# Patient Record
Sex: Female | Born: 1965
Health system: Southern US, Community
[De-identification: ages and names within clinical notes are randomized; demographics above are authoritative.]

## PROBLEM LIST (undated history)

## (undated) DIAGNOSIS — I10 Essential (primary) hypertension: Secondary | ICD-10-CM

## (undated) DIAGNOSIS — D869 Sarcoidosis, unspecified: Secondary | ICD-10-CM

## (undated) DIAGNOSIS — I2510 Atherosclerotic heart disease of native coronary artery without angina pectoris: Secondary | ICD-10-CM

## (undated) DIAGNOSIS — F419 Anxiety disorder, unspecified: Secondary | ICD-10-CM

## (undated) DIAGNOSIS — I749 Embolism and thrombosis of unspecified artery: Secondary | ICD-10-CM

## (undated) HISTORY — DX: Essential (primary) hypertension: I10

## (undated) HISTORY — PX: CORONARY ANGIOPLASTY: SHX604

## (undated) HISTORY — PX: REDUCTION MAMMAPLASTY: SUR839

## (undated) HISTORY — PX: TUBAL LIGATION: SHX77

## (undated) HISTORY — PX: LEG SURGERY: SHX1003

---

## 2002-04-28 ENCOUNTER — Emergency Department (HOSPITAL_COMMUNITY): Admission: EM | Admit: 2002-04-28 | Discharge: 2002-04-29 | Payer: Self-pay | Admitting: *Deleted

## 2002-09-22 ENCOUNTER — Emergency Department (HOSPITAL_COMMUNITY): Admission: EM | Admit: 2002-09-22 | Discharge: 2002-09-22 | Payer: Self-pay | Admitting: Emergency Medicine

## 2003-01-11 ENCOUNTER — Emergency Department (HOSPITAL_COMMUNITY): Admission: EM | Admit: 2003-01-11 | Discharge: 2003-01-12 | Payer: Self-pay | Admitting: Emergency Medicine

## 2003-01-12 ENCOUNTER — Encounter: Payer: Self-pay | Admitting: Emergency Medicine

## 2004-02-26 ENCOUNTER — Emergency Department (HOSPITAL_COMMUNITY): Admission: EM | Admit: 2004-02-26 | Discharge: 2004-02-27 | Payer: Self-pay | Admitting: Emergency Medicine

## 2004-03-06 ENCOUNTER — Ambulatory Visit (HOSPITAL_COMMUNITY): Admission: RE | Admit: 2004-03-06 | Discharge: 2004-03-06 | Payer: Self-pay | Admitting: Cardiology

## 2004-11-15 ENCOUNTER — Emergency Department (HOSPITAL_COMMUNITY): Admission: EM | Admit: 2004-11-15 | Discharge: 2004-11-15 | Payer: Self-pay | Admitting: Family Medicine

## 2005-06-10 ENCOUNTER — Emergency Department (HOSPITAL_COMMUNITY): Admission: EM | Admit: 2005-06-10 | Discharge: 2005-06-10 | Payer: Self-pay | Admitting: Emergency Medicine

## 2005-11-04 ENCOUNTER — Ambulatory Visit: Payer: Self-pay | Admitting: Nurse Practitioner

## 2005-11-05 ENCOUNTER — Ambulatory Visit (HOSPITAL_COMMUNITY): Admission: RE | Admit: 2005-11-05 | Discharge: 2005-11-05 | Payer: Self-pay | Admitting: Internal Medicine

## 2005-12-02 ENCOUNTER — Ambulatory Visit: Payer: Self-pay | Admitting: *Deleted

## 2006-01-02 ENCOUNTER — Ambulatory Visit: Payer: Self-pay | Admitting: Nurse Practitioner

## 2006-01-11 ENCOUNTER — Encounter: Admission: RE | Admit: 2006-01-11 | Discharge: 2006-01-11 | Payer: Self-pay | Admitting: Family Medicine

## 2006-01-15 ENCOUNTER — Ambulatory Visit: Payer: Self-pay | Admitting: Nurse Practitioner

## 2006-01-27 ENCOUNTER — Ambulatory Visit: Payer: Self-pay | Admitting: Nurse Practitioner

## 2006-02-13 ENCOUNTER — Emergency Department (HOSPITAL_COMMUNITY): Admission: EM | Admit: 2006-02-13 | Discharge: 2006-02-14 | Payer: Self-pay | Admitting: Emergency Medicine

## 2006-02-26 ENCOUNTER — Emergency Department (HOSPITAL_COMMUNITY): Admission: EM | Admit: 2006-02-26 | Discharge: 2006-02-27 | Payer: Self-pay | Admitting: Emergency Medicine

## 2006-03-12 ENCOUNTER — Ambulatory Visit: Payer: Self-pay | Admitting: Nurse Practitioner

## 2006-05-03 ENCOUNTER — Emergency Department (HOSPITAL_COMMUNITY): Admission: EM | Admit: 2006-05-03 | Discharge: 2006-05-04 | Payer: Self-pay | Admitting: Emergency Medicine

## 2006-05-15 ENCOUNTER — Ambulatory Visit: Payer: Self-pay | Admitting: Internal Medicine

## 2006-05-19 ENCOUNTER — Ambulatory Visit: Payer: Self-pay | Admitting: Internal Medicine

## 2006-05-21 ENCOUNTER — Ambulatory Visit: Payer: Self-pay | Admitting: Internal Medicine

## 2006-05-21 ENCOUNTER — Inpatient Hospital Stay (HOSPITAL_COMMUNITY): Admission: EM | Admit: 2006-05-21 | Discharge: 2006-05-23 | Payer: Self-pay | Admitting: Internal Medicine

## 2006-05-21 ENCOUNTER — Encounter (INDEPENDENT_AMBULATORY_CARE_PROVIDER_SITE_OTHER): Payer: Self-pay | Admitting: *Deleted

## 2006-05-29 ENCOUNTER — Ambulatory Visit: Payer: Self-pay | Admitting: Internal Medicine

## 2006-07-03 ENCOUNTER — Ambulatory Visit: Payer: Self-pay | Admitting: Internal Medicine

## 2006-07-22 ENCOUNTER — Ambulatory Visit: Payer: Self-pay | Admitting: Internal Medicine

## 2006-09-02 ENCOUNTER — Ambulatory Visit: Payer: Self-pay | Admitting: Internal Medicine

## 2006-09-24 ENCOUNTER — Emergency Department (HOSPITAL_COMMUNITY): Admission: EM | Admit: 2006-09-24 | Discharge: 2006-09-24 | Payer: Self-pay | Admitting: Emergency Medicine

## 2006-11-20 ENCOUNTER — Ambulatory Visit: Payer: Self-pay | Admitting: Internal Medicine

## 2006-12-04 ENCOUNTER — Ambulatory Visit: Payer: Self-pay | Admitting: Internal Medicine

## 2006-12-21 ENCOUNTER — Ambulatory Visit (HOSPITAL_COMMUNITY): Admission: RE | Admit: 2006-12-21 | Discharge: 2006-12-21 | Payer: Self-pay | Admitting: Neurology

## 2007-10-13 DIAGNOSIS — G63 Polyneuropathy in diseases classified elsewhere: Secondary | ICD-10-CM | POA: Insufficient documentation

## 2007-10-30 ENCOUNTER — Emergency Department (HOSPITAL_COMMUNITY): Admission: EM | Admit: 2007-10-30 | Discharge: 2007-10-30 | Payer: Self-pay | Admitting: Emergency Medicine

## 2008-02-17 ENCOUNTER — Emergency Department (HOSPITAL_COMMUNITY): Admission: EM | Admit: 2008-02-17 | Discharge: 2008-02-17 | Payer: Self-pay | Admitting: Emergency Medicine

## 2008-04-12 ENCOUNTER — Emergency Department (HOSPITAL_COMMUNITY): Admission: EM | Admit: 2008-04-12 | Discharge: 2008-04-12 | Payer: Self-pay | Admitting: Emergency Medicine

## 2008-06-16 ENCOUNTER — Emergency Department (HOSPITAL_COMMUNITY): Admission: EM | Admit: 2008-06-16 | Discharge: 2008-06-16 | Payer: Self-pay | Admitting: Emergency Medicine

## 2008-07-25 ENCOUNTER — Emergency Department (HOSPITAL_COMMUNITY): Admission: AC | Admit: 2008-07-25 | Discharge: 2008-07-25 | Payer: Self-pay | Admitting: Emergency Medicine

## 2009-03-06 ENCOUNTER — Emergency Department (HOSPITAL_COMMUNITY): Admission: EM | Admit: 2009-03-06 | Discharge: 2009-03-06 | Payer: Self-pay | Admitting: Emergency Medicine

## 2009-06-19 ENCOUNTER — Emergency Department (HOSPITAL_COMMUNITY): Admission: EM | Admit: 2009-06-19 | Discharge: 2009-06-19 | Payer: Self-pay | Admitting: Emergency Medicine

## 2009-08-09 ENCOUNTER — Observation Stay (HOSPITAL_COMMUNITY): Admission: EM | Admit: 2009-08-09 | Discharge: 2009-08-11 | Payer: Self-pay | Admitting: Emergency Medicine

## 2009-08-10 ENCOUNTER — Encounter (INDEPENDENT_AMBULATORY_CARE_PROVIDER_SITE_OTHER): Payer: Self-pay | Admitting: Cardiology

## 2009-11-24 ENCOUNTER — Observation Stay (HOSPITAL_COMMUNITY): Admission: EM | Admit: 2009-11-24 | Discharge: 2009-11-24 | Payer: Self-pay | Admitting: Emergency Medicine

## 2009-12-23 ENCOUNTER — Emergency Department (HOSPITAL_COMMUNITY): Admission: EM | Admit: 2009-12-23 | Discharge: 2009-12-23 | Payer: Self-pay | Admitting: Family Medicine

## 2010-01-09 ENCOUNTER — Emergency Department (HOSPITAL_COMMUNITY): Admission: EM | Admit: 2010-01-09 | Discharge: 2010-01-10 | Payer: Self-pay | Admitting: Emergency Medicine

## 2010-01-14 ENCOUNTER — Emergency Department (HOSPITAL_COMMUNITY): Admission: EM | Admit: 2010-01-14 | Discharge: 2010-01-14 | Payer: Self-pay | Admitting: Emergency Medicine

## 2010-03-23 ENCOUNTER — Emergency Department (HOSPITAL_COMMUNITY): Admission: EM | Admit: 2010-03-23 | Discharge: 2010-03-24 | Payer: Self-pay | Admitting: Emergency Medicine

## 2010-11-13 ENCOUNTER — Emergency Department (HOSPITAL_COMMUNITY)
Admission: EM | Admit: 2010-11-13 | Discharge: 2010-11-14 | Payer: Self-pay | Source: Home / Self Care | Admitting: Emergency Medicine

## 2010-11-18 LAB — URINALYSIS, ROUTINE W REFLEX MICROSCOPIC
Bilirubin Urine: NEGATIVE
Hgb urine dipstick: NEGATIVE
Ketones, ur: NEGATIVE mg/dL
Nitrite: NEGATIVE
Protein, ur: NEGATIVE mg/dL
Specific Gravity, Urine: 1.029 (ref 1.005–1.030)
Urine Glucose, Fasting: 100 mg/dL — AB
Urobilinogen, UA: 0.2 mg/dL (ref 0.0–1.0)
pH: 5.5 (ref 5.0–8.0)

## 2010-11-18 LAB — DIFFERENTIAL
Basophils Absolute: 0 10*3/uL (ref 0.0–0.1)
Basophils Relative: 0 % (ref 0–1)
Eosinophils Absolute: 0.1 10*3/uL (ref 0.0–0.7)
Eosinophils Relative: 1 % (ref 0–5)
Lymphocytes Relative: 20 % (ref 12–46)
Lymphs Abs: 2.2 10*3/uL (ref 0.7–4.0)
Monocytes Absolute: 0.8 10*3/uL (ref 0.1–1.0)
Monocytes Relative: 7 % (ref 3–12)
Neutro Abs: 7.7 10*3/uL (ref 1.7–7.7)
Neutrophils Relative %: 72 % (ref 43–77)

## 2010-11-18 LAB — BASIC METABOLIC PANEL
BUN: 7 mg/dL (ref 6–23)
CO2: 25 mEq/L (ref 19–32)
Calcium: 8.9 mg/dL (ref 8.4–10.5)
Chloride: 103 mEq/L (ref 96–112)
Creatinine, Ser: 0.69 mg/dL (ref 0.4–1.2)
GFR calc Af Amer: >60 mL/min (ref >60)
GFR calc non Af Amer: >60 mL/min (ref >60)
Glucose, Bld: 169 mg/dL — ABNORMAL HIGH (ref 70–99)
Potassium: 3.9 mEq/L (ref 3.5–5.1)
Sodium: 136 mEq/L (ref 135–145)

## 2010-11-18 LAB — POCT CARDIAC MARKERS
CKMB, poc: <1 ng/mL — ABNORMAL LOW (ref 1.0–8.0)
Myoglobin, poc: 50.9 ng/mL (ref 12–200)
Troponin i, poc: <0.05 ng/mL (ref 0.00–0.09)

## 2010-11-18 LAB — CBC
HCT: 36.1 % (ref 36.0–46.0)
Hemoglobin: 12.2 g/dL (ref 12.0–15.0)
MCH: 30 pg (ref 26.0–34.0)
MCHC: 33.8 g/dL (ref 30.0–36.0)
MCV: 88.9 fL (ref 78.0–100.0)
Platelets: 276 10*3/uL (ref 150–400)
RBC: 4.06 MIL/uL (ref 3.87–5.11)
RDW: 15.5 % (ref 11.5–15.5)
WBC: 10.8 10*3/uL — ABNORMAL HIGH (ref 4.0–10.5)

## 2010-11-24 ENCOUNTER — Encounter: Payer: Self-pay | Admitting: Internal Medicine

## 2010-11-25 ENCOUNTER — Encounter: Payer: Self-pay | Admitting: Internal Medicine

## 2011-01-16 ENCOUNTER — Emergency Department (HOSPITAL_COMMUNITY)
Admission: EM | Admit: 2011-01-16 | Discharge: 2011-01-16 | Disposition: A | Discharge status: Home / Self Care | Payer: Medicaid Other | Attending: Emergency Medicine | Admitting: Emergency Medicine

## 2011-01-16 DIAGNOSIS — I252 Old myocardial infarction: Secondary | ICD-10-CM | POA: Insufficient documentation

## 2011-01-16 DIAGNOSIS — D869 Sarcoidosis, unspecified: Secondary | ICD-10-CM | POA: Insufficient documentation

## 2011-01-16 DIAGNOSIS — L293 Anogenital pruritus, unspecified: Secondary | ICD-10-CM | POA: Insufficient documentation

## 2011-01-16 DIAGNOSIS — B373 Candidiasis of vulva and vagina: Secondary | ICD-10-CM | POA: Insufficient documentation

## 2011-01-16 DIAGNOSIS — N898 Other specified noninflammatory disorders of vagina: Secondary | ICD-10-CM | POA: Insufficient documentation

## 2011-01-16 DIAGNOSIS — E119 Type 2 diabetes mellitus without complications: Secondary | ICD-10-CM | POA: Insufficient documentation

## 2011-01-16 DIAGNOSIS — K219 Gastro-esophageal reflux disease without esophagitis: Secondary | ICD-10-CM | POA: Insufficient documentation

## 2011-01-16 DIAGNOSIS — I10 Essential (primary) hypertension: Secondary | ICD-10-CM | POA: Insufficient documentation

## 2011-01-16 DIAGNOSIS — B3731 Acute candidiasis of vulva and vagina: Secondary | ICD-10-CM | POA: Insufficient documentation

## 2011-01-16 LAB — URINALYSIS, ROUTINE W REFLEX MICROSCOPIC
Glucose, UA: 500 mg/dL — AB
Nitrite: NEGATIVE
pH: 5 (ref 5.0–8.0)

## 2011-01-16 LAB — WET PREP, GENITAL: WBC, Wet Prep HPF POC: NONE SEEN

## 2011-01-16 LAB — GC/CHLAMYDIA PROBE AMP, GENITAL: Chlamydia, DNA Probe: NEGATIVE

## 2011-01-16 LAB — URINE MICROSCOPIC-ADD ON

## 2011-01-16 LAB — POCT PREGNANCY, URINE: Preg Test, Ur: NEGATIVE

## 2011-01-19 LAB — COMPREHENSIVE METABOLIC PANEL
ALT: 19 U/L (ref 0–35)
AST: 19 U/L (ref 0–37)
Calcium: 8.7 mg/dL (ref 8.4–10.5)
Creatinine, Ser: 0.7 mg/dL (ref 0.4–1.2)
GFR calc Af Amer: >60 mL/min (ref >60)
Glucose, Bld: 162 mg/dL — ABNORMAL HIGH (ref 70–99)
Sodium: 138 mEq/L (ref 135–145)
Total Protein: 6.6 g/dL (ref 6.0–8.3)

## 2011-01-19 LAB — URINALYSIS, ROUTINE W REFLEX MICROSCOPIC
Nitrite: NEGATIVE
Protein, ur: NEGATIVE mg/dL
Specific Gravity, Urine: 1.03 (ref 1.005–1.030)
Urobilinogen, UA: 0.2 mg/dL (ref 0.0–1.0)
pH: 5.5 (ref 5.0–8.0)

## 2011-01-19 LAB — URINE MICROSCOPIC-ADD ON

## 2011-01-19 LAB — DIFFERENTIAL
Eosinophils Absolute: 0.1 10*3/uL (ref 0.0–0.7)
Lymphocytes Relative: 29 % (ref 12–46)
Lymphs Abs: 1.1 10*3/uL (ref 0.7–4.0)
Monocytes Relative: 12 % (ref 3–12)
Neutrophils Relative %: 58 % (ref 43–77)

## 2011-01-19 LAB — LIPASE, BLOOD: Lipase: 38 U/L (ref 11–59)

## 2011-01-19 LAB — CBC
MCHC: 35.1 g/dL (ref 30.0–36.0)
MCV: 98.1 fL (ref 78.0–100.0)
RDW: 13.7 % (ref 11.5–15.5)

## 2011-01-20 LAB — GLUCOSE, CAPILLARY: Glucose-Capillary: 186 mg/dL — ABNORMAL HIGH (ref 70–99)

## 2011-01-20 LAB — DIFFERENTIAL
Basophils Relative: 1 % (ref 0–1)
Eosinophils Absolute: 0.1 10*3/uL (ref 0.0–0.7)
Eosinophils Relative: 1 % (ref 0–5)
Lymphs Abs: 1.9 10*3/uL (ref 0.7–4.0)
Monocytes Absolute: 0.6 10*3/uL (ref 0.1–1.0)
Monocytes Relative: 5 % (ref 3–12)
Neutrophils Relative %: 77 % (ref 43–77)

## 2011-01-20 LAB — DIC (DISSEMINATED INTRAVASCULAR COAGULATION)PANEL
INR: 0.96 (ref 0.00–1.49)
Platelets: 257 10*3/uL (ref 150–400)
Smear Review: NONE SEEN

## 2011-01-20 LAB — D-DIMER, QUANTITATIVE: D-Dimer, Quant: 0.26 ug/mL-FEU (ref 0.00–0.48)

## 2011-01-20 LAB — POCT CARDIAC MARKERS
CKMB, poc: 1.7 ng/mL (ref 1.0–8.0)
CKMB, poc: <1 ng/mL — ABNORMAL LOW (ref 1.0–8.0)
Myoglobin, poc: 57.6 ng/mL (ref 12–200)
Myoglobin, poc: 60.4 ng/mL (ref 12–200)
Troponin i, poc: <0.05 ng/mL (ref 0.00–0.09)

## 2011-01-20 LAB — CBC
HCT: 35.9 % — ABNORMAL LOW (ref 36.0–46.0)
Hemoglobin: 12.5 g/dL (ref 12.0–15.0)
MCHC: 34.8 g/dL (ref 30.0–36.0)
MCV: 96.9 fL (ref 78.0–100.0)
RBC: 3.71 MIL/uL — ABNORMAL LOW (ref 3.87–5.11)
WBC: 11.7 10*3/uL — ABNORMAL HIGH (ref 4.0–10.5)

## 2011-01-20 LAB — BASIC METABOLIC PANEL
CO2: 24 mEq/L (ref 19–32)
Chloride: 106 mEq/L (ref 96–112)
Creatinine, Ser: 0.96 mg/dL (ref 0.4–1.2)
GFR calc Af Amer: >60 mL/min (ref >60)
Potassium: 3.6 mEq/L (ref 3.5–5.1)

## 2011-01-20 LAB — PROTIME-INR: INR: 0.95 (ref 0.00–1.49)

## 2011-01-26 LAB — URINALYSIS, ROUTINE W REFLEX MICROSCOPIC
Bilirubin Urine: NEGATIVE
Glucose, UA: 100 mg/dL — AB
Hgb urine dipstick: NEGATIVE
Protein, ur: NEGATIVE mg/dL

## 2011-01-26 LAB — POCT I-STAT, CHEM 8
BUN: 12 mg/dL (ref 6–23)
Chloride: 105 mEq/L (ref 96–112)
HCT: 39 % (ref 36.0–46.0)
Potassium: 4 mEq/L (ref 3.5–5.1)

## 2011-01-26 LAB — POCT CARDIAC MARKERS
CKMB, poc: <1 ng/mL — ABNORMAL LOW (ref 1.0–8.0)
Myoglobin, poc: 24.9 ng/mL (ref 12–200)
Troponin i, poc: <0.05 ng/mL (ref 0.00–0.09)

## 2011-01-26 LAB — POCT PREGNANCY, URINE: Preg Test, Ur: NEGATIVE

## 2011-01-26 LAB — GLUCOSE, CAPILLARY: Glucose-Capillary: 270 mg/dL — ABNORMAL HIGH (ref 70–99)

## 2011-02-06 LAB — COMPREHENSIVE METABOLIC PANEL
Alkaline Phosphatase: 42 U/L (ref 39–117)
BUN: 10 mg/dL (ref 6–23)
Chloride: 106 mEq/L (ref 96–112)
GFR calc non Af Amer: >60 mL/min (ref >60)
Glucose, Bld: 117 mg/dL — ABNORMAL HIGH (ref 70–99)
Potassium: 3.4 mEq/L — ABNORMAL LOW (ref 3.5–5.1)
Total Bilirubin: 0.3 mg/dL (ref 0.3–1.2)

## 2011-02-06 LAB — RAPID URINE DRUG SCREEN, HOSP PERFORMED
Amphetamines: NOT DETECTED
Barbiturates: POSITIVE — AB
Benzodiazepines: NOT DETECTED
Cocaine: NOT DETECTED

## 2011-02-06 LAB — BASIC METABOLIC PANEL
BUN: 10 mg/dL (ref 6–23)
BUN: 13 mg/dL (ref 6–23)
CO2: 25 mEq/L (ref 19–32)
CO2: 27 mEq/L (ref 19–32)
Calcium: 9.1 mg/dL (ref 8.4–10.5)
Chloride: 107 mEq/L (ref 96–112)
Creatinine, Ser: 0.58 mg/dL (ref 0.4–1.2)
Creatinine, Ser: 0.7 mg/dL (ref 0.4–1.2)
Glucose, Bld: 103 mg/dL — ABNORMAL HIGH (ref 70–99)
Potassium: 3.7 mEq/L (ref 3.5–5.1)

## 2011-02-06 LAB — URINALYSIS, ROUTINE W REFLEX MICROSCOPIC
Bilirubin Urine: NEGATIVE
Ketones, ur: NEGATIVE mg/dL
Leukocytes, UA: NEGATIVE
Nitrite: NEGATIVE
Protein, ur: NEGATIVE mg/dL
Urobilinogen, UA: 1 mg/dL (ref 0.0–1.0)
pH: 6 (ref 5.0–8.0)

## 2011-02-06 LAB — GLUCOSE, CAPILLARY
Glucose-Capillary: 108 mg/dL — ABNORMAL HIGH (ref 70–99)
Glucose-Capillary: 113 mg/dL — ABNORMAL HIGH (ref 70–99)
Glucose-Capillary: 142 mg/dL — ABNORMAL HIGH (ref 70–99)
Glucose-Capillary: 156 mg/dL — ABNORMAL HIGH (ref 70–99)
Glucose-Capillary: 96 mg/dL (ref 70–99)

## 2011-02-06 LAB — D-DIMER, QUANTITATIVE: D-Dimer, Quant: <0.22 ug/mL-FEU (ref 0.00–0.48)

## 2011-02-06 LAB — CBC
HCT: 35.4 % — ABNORMAL LOW (ref 36.0–46.0)
MCHC: 34.2 g/dL (ref 30.0–36.0)
Platelets: 201 10*3/uL (ref 150–400)
Platelets: 234 10*3/uL (ref 150–400)
RBC: 3.38 MIL/uL — ABNORMAL LOW (ref 3.87–5.11)
RBC: 3.59 MIL/uL — ABNORMAL LOW (ref 3.87–5.11)
RDW: 13.9 % (ref 11.5–15.5)
WBC: 7.4 10*3/uL (ref 4.0–10.5)
WBC: 7.5 10*3/uL (ref 4.0–10.5)

## 2011-02-06 LAB — PROTIME-INR
INR: 0.94 (ref 0.00–1.49)
Prothrombin Time: 12.5 seconds (ref 11.6–15.2)

## 2011-02-06 LAB — CARDIAC PANEL(CRET KIN+CKTOT+MB+TROPI)
Relative Index: 1.2 (ref 0.0–2.5)
Relative Index: 1.4 (ref 0.0–2.5)
Total CK: 123 U/L (ref 7–177)
Troponin I: 0.01 ng/mL (ref 0.00–0.06)
Troponin I: 0.01 ng/mL (ref 0.00–0.06)

## 2011-02-06 LAB — LIPID PANEL
Cholesterol: 196 mg/dL (ref 0–200)
HDL: 52 mg/dL (ref >39)
LDL Cholesterol: 132 mg/dL — ABNORMAL HIGH (ref 0–99)
Triglycerides: 62 mg/dL (ref <150)

## 2011-02-06 LAB — HEMOGLOBIN A1C: Hgb A1c MFr Bld: 6.1 % (ref 4.6–6.1)

## 2011-02-06 LAB — CK TOTAL AND CKMB (NOT AT ARMC): Relative Index: 1.8 (ref 0.0–2.5)

## 2011-02-06 LAB — POCT CARDIAC MARKERS: Troponin i, poc: <0.05 ng/mL (ref 0.00–0.09)

## 2011-02-06 LAB — TROPONIN I: Troponin I: 0.01 ng/mL (ref 0.00–0.06)

## 2011-02-08 LAB — PREGNANCY, URINE: Preg Test, Ur: NEGATIVE

## 2011-02-08 LAB — URINALYSIS, ROUTINE W REFLEX MICROSCOPIC
Hgb urine dipstick: NEGATIVE
Protein, ur: NEGATIVE mg/dL
Urobilinogen, UA: 1 mg/dL (ref 0.0–1.0)

## 2011-02-11 LAB — CBC
HCT: 40.1 % (ref 36.0–46.0)
Hemoglobin: 13.9 g/dL (ref 12.0–15.0)
MCHC: 34.5 g/dL (ref 30.0–36.0)
MCV: 96.6 fL (ref 78.0–100.0)
Platelets: 240 10*3/uL (ref 150–400)
RBC: 4.16 MIL/uL (ref 3.87–5.11)
RDW: 13.3 % (ref 11.5–15.5)
WBC: 5.3 10*3/uL (ref 4.0–10.5)

## 2011-02-11 LAB — DIFFERENTIAL
Basophils Absolute: 0 10*3/uL (ref 0.0–0.1)
Basophils Relative: 1 % (ref 0–1)
Eosinophils Absolute: 0.1 10*3/uL (ref 0.0–0.7)
Eosinophils Relative: 2 % (ref 0–5)
Lymphocytes Relative: 30 % (ref 12–46)
Lymphs Abs: 1.6 10*3/uL (ref 0.7–4.0)
Monocytes Absolute: 0.4 10*3/uL (ref 0.1–1.0)
Monocytes Relative: 7 % (ref 3–12)
Neutro Abs: 3.1 10*3/uL (ref 1.7–7.7)
Neutrophils Relative %: 60 % (ref 43–77)

## 2011-02-11 LAB — URINALYSIS, ROUTINE W REFLEX MICROSCOPIC
Bilirubin Urine: NEGATIVE
Glucose, UA: NEGATIVE mg/dL
Hgb urine dipstick: NEGATIVE
Ketones, ur: NEGATIVE mg/dL
Nitrite: NEGATIVE
Protein, ur: NEGATIVE mg/dL
Specific Gravity, Urine: 1.024 (ref 1.005–1.030)
Urobilinogen, UA: 0.2 mg/dL (ref 0.0–1.0)
pH: 5.5 (ref 5.0–8.0)

## 2011-02-11 LAB — BASIC METABOLIC PANEL
BUN: 8 mg/dL (ref 6–23)
CO2: 27 mEq/L (ref 19–32)
Calcium: 8.9 mg/dL (ref 8.4–10.5)
Chloride: 103 mEq/L (ref 96–112)
Creatinine, Ser: 0.67 mg/dL (ref 0.4–1.2)
GFR calc Af Amer: >60 mL/min (ref >60)
GFR calc non Af Amer: >60 mL/min (ref >60)
Glucose, Bld: 113 mg/dL — ABNORMAL HIGH (ref 70–99)
Potassium: 4 mEq/L (ref 3.5–5.1)
Sodium: 137 mEq/L (ref 135–145)

## 2011-02-11 LAB — POCT I-STAT, CHEM 8
BUN: 8 mg/dL (ref 6–23)
Calcium, Ion: 1.17 mmol/L (ref 1.12–1.32)
Creatinine, Ser: 0.8 mg/dL (ref 0.4–1.2)
Glucose, Bld: 99 mg/dL (ref 70–99)
TCO2: 27 mmol/L (ref 0–100)

## 2011-02-11 LAB — URINE MICROSCOPIC-ADD ON

## 2011-02-11 LAB — GLUCOSE, CAPILLARY: Glucose-Capillary: 104 mg/dL — ABNORMAL HIGH (ref 70–99)
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
## 2011-03-21 NOTE — Letter (Signed)
October 26, 2006    Lavonda Jumbo, M.D.  [address] [place], [place] [address]   RE:  [name], [name]  MRN:  [id number]  /  DOB:  11-Jan-1966   Dear. Dr. Lynelle Doctor:   My records indicate that [name], a very nice 45 year old black  female, failed to followup with me on two consecutive occasions for a  presumed diagnosis of sarcoid. As you may recall from previous notes,  she had classic cobblestoning of the airway, but we were limited in  terms providing adequate tissue to comfirm this due to bleeding at  biopsy (that did not show sarcoid). Followup chest x-ray on September 02, 2006, continued to show evidence of mediastinal hilar adenopathy and I  recommended followup cxr to make sure that these changes were stable and  that she was not developing any other complications of sarcoid.   When I last saw her on October 31, she had no problems with dyspnea or  cough while taking omeprazole for presumed component of reflux.   According to my records, she is due for a chest x-ray on February 1st,  and this can be done at your office. Alternatively, she could contact my  office for an appointment, but our policy is that if she continues to  fail to keep appointments then we can no longer see her here.    Sincerely,      Casimiro Needle B. Sherene Sires, MD, [facility]  Electronically Signed    MBW/MedQ  DD: 10/26/2006  DT: 10/26/2006  Job #: [id number]   CC:    [name]

## 2011-03-21 NOTE — Assessment & Plan Note (Signed)
[facility]                               PULMONARY OFFICE NOTE   NAME:[name], [name]                       MRN:          [id number]  DATE:05/15/2006                            DOB:          August 04, 1966    REASON FOR CONSULTATION:  Cough/abnormal CT scan.   HISTORY:  45 year old black female with indolent onset of cough 2 months ago  that she thought initially was a bad chest cold or bronchitis, but has  gradually worsened to the point where she is having trouble day and night  with a hacking cough, associated sometimes with dyspnea, and generalized  chest discomfort during coughing.  She had also noticed loss of appetite and  thinks she may be losing weight, and for this reason was seen by Dr. Lynelle Doctor,  and a chest x-ray, CT scan suggested adenopathy, and she is therefore seen  at Dr. Delford Field request for consideration for biopsy.   The patient denies any new ocular or articular complaints to suggest  sarcoid, or any skin rash, fevers or saturating sweats.   She has already been treated with macrolide antibiotics and bronchodilators,  but she said it did not help the cough at all.   PAST MEDICAL HISTORY:  Significant for hypertension, obesity with  hypertension and diabetes.   ALLERGIES:  None known.   MEDICATIONS:  None regularly.   SOCIAL HISTORY:  She has never smoked, works at [employer].   FAMILY HISTORY:  Positive for asthma, reflux and diabetes.  Negative for  sarcoid or rheumatologic disease.   REVIEW OF SYSTEMS:  Taken in detail on the work sheet, significant for the  problems as outlined above.   PHYSICAL EXAMINATION:  This is a moderately obese black female in acute  distress.  She is afebrile and normal vital signs.  HEENT:  Unremarkable.  Pharynx is clear, nasal turbinates are normal with no  crusting, pallor, polyps or cyanosis.  NECK:  Supple without cervical adenopathy or tenderness.  Trachea is  midline, no  organomegaly.  LUNGS:  Lung fields are perfectly clear bilaterally to auscultation and  percussion.  There is good inspiratory and expiratory air movement, and no  cough elicited on inspiratory or expiratory maneuvers.  CARDIAC:  There is regular rhythm without murmur, gallop, rub, or increase  in P2.  S1, S2 were diminished.  ABDOMEN:  Obese/benign with normal excursion.  EXTREMITIES:  Warm without calf tenderness, cyanosis, clubbing or edema.  She did have very subtle macronodular skin changes over the right greater  than left elbows.   Chest x-ray and CT scan were consistent with sarcoid, although there was  some asymmetry to the mediastinal, and adenopathy more on the left than the  right in a CT scan I reviewed from May 04, 2006.   IMPRESSION:  This patient almost certainly has sarcoid, with extensive  mediastinal hilar adenopathy, albeit somewhat asymmetric involving the left  greater than the right chest.  She does not appear to have significant  interstitial lung disease, but has developed significant cough, and may have  subtle skin changes as  well.  If she had more classic skin changes, I would  recommend a skin biopsy first, but based on the high diagnostic yield for  bronchoscopy and relatively low risk, I believe the best way to make the  most specific diagnosis is to proceed with bronchoscopy with transbronchial  biopsy, and I discussed this issue with her at length.  She agreed  tentatively to schedule this at [facility] May 21, 2006.                                   Charlaine Dalton. Sherene Sires, MD, [facility]   MBW/MedQ  DD:  05/16/2006  DT:  05/16/2006  Job #:  [id number]   cc:   Devoria Albe, MD

## 2011-03-21 NOTE — Op Note (Signed)
NAME:  [name], [name]                ACCOUNT NO.:  [id number]   MEDICAL RECORD NO.:  [id number]          PATIENT TYPE:  AMB   LOCATION:  CARD                         FACILITY:  [facility]   PHYSICIAN:  Casimiro Needle B. Sherene Sires, M.D. [facility] OF BIRTH:  1966-10-01   DATE OF PROCEDURE:  05/21/2006  DATE OF DISCHARGE:                                 OPERATIVE REPORT   PROCEDURE PERFORMED:  Fiberoptic bronchoscopy with transbronchial biopsy of  the left upper lobe.   REFERRING PHYSICIAN:  Lavonda Jumbo, M.D.   For history and indications, please see attached dictated consultation note.  The patient agreed to the procedure after a full discussion of risks,  benefits and alternatives.  She was premedicated with 1% lidocaine while  being observed under continuous pulse oximetry and surface ECG.  She  maintained adequate saturations except for a brief period of desaturation  responding to jaw lift during the procedure.   DESCRIPTION OF PROCEDURE:  Neither naris could be adequately cannulated  despite use of topical Neo-Synephrine and therefore a bite block was placed  and the procedure was performed via the oropharynx.   The patient received a total of 7.5 mg IV Versed and 100 mg of IV Demerol  during the procedure for adequate sedation and cough suppression.   DESCRIPTION OF PROCEDURE:  Using a standard flexible video bronchoscope, and  an oral bite block/adapter, the oropharynx was visually inspected and larynx  were visually inspected with no obvious upper airway lesions.  Using  additional 1% lidocaine as needed, the entire tracheobronchial tree was then  explored bilaterally with the following findings.  (1) Trachea, carina and  all the airways opened widely to the subsegmental level.  (2) There was mild  diffuse airway edema with erythema and cobblestoning was present throughout  the airways of the left upper and lower lobes.  This was typical of sarcoid.   PROCEDURE:  1.  Using initially the  lingula was lavaged with minimal return, sent for      AFB and fungal stain and culture.  2.  Transbronchial biopsies were obtained x 3 within the left upper lobe      with moderate bleeding encountered after the second biopsy and      controlled with saline lavage.   After the procedure, the patient again had trouble with upper airway  obstruction which responded to a jaw lift maneuver and was given Marcaine to  reverse the effects of Demerol.  Will be observed and also follow-up chest x-  ray obtained.   IMPRESSION:  Prominent mediastinal and left hilar adenopathy associated with  cobblestoning of the airway, classic for sarcoid.   RECOMMENDATIONS:  Await tissue confirmation with follow-up in the office  obtained.           ______________________________  Charlaine Dalton. Sherene Sires, M.D. [facility]     MBW/MEDQ  D:  05/21/2006  T:  05/21/2006  Job:  [id number]

## 2011-03-21 NOTE — Assessment & Plan Note (Signed)
[facility]                               PULMONARY OFFICE NOTE   NAME:[name], Sandra                       MRN:          [id number]  DATE:05/29/2006                            DOB:          Mar 29, 1966    Pulmonary/extended office visit.   HISTORY:  45 year old black female with a 52-month history of cough when I  saw her on July 13 with classic mediastinal and hilar changes suggesting  sarcoid.  Bronchoscopy was performed on July 19, indicating classic  cobblestoning pattern in the left greater than right upper lobe.  She had  significant bleeding after the second biopsy, and minimal tissue was  obtained, which was nondiagnostic.  However, she dramatically improved  during hospitalization, and is worse since discharge having tapered  prednisone off, and comes in complaining of a throat throbbing, increasing  dyspnea with activity and also when she tries to lie down at night, but no  hemoptysis or purulent sputum, fevers, chills, sweats.  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   MEDICATIONS:  At this point consist only of:  1.  Elavil 25 mg 1/2 daily.  2.  Protonix dose after breakfast.   PHYSICAL EXAMINATION:  GENERAL:  She is an obese ambulatory black female in  no acute distress.  VITAL SIGNS:  She is afebrile, stable vital signs, with a weight of 212  pounds, which is up 3 pounds from previous visit.  HEENT:  Unremarkable.  Anicteric sclerae.  NECK:  Supple without cervical adenopathy or tenderness.  Trachea is midline  without thyromegaly.  CHEST:  Lung fields are perfectly clear bilaterally to auscultation and  percussion.  HEART:  Appears regular rhythm without murmur, gallop or rub.  ABDOMEN:  Soft, benign.  EXTREMITIES:  Warm without calf tenderness, cyanosis, clubbing or edema.   IMPRESSION:  I had an extended discussion with this patient, lasting 15-20  minutes of a 25 minute visit  on the following topics:  1.  First, there was enough evidence clinically and by bronchoscopy without      specific tissue diagnosis to indicate she has sarcoid involving airways,      lymph nodes and eye tissue.  Based on the amount of symptoms she is      having as well as ocular involvement, I have strongly recommended      starting her on prednisone 10 mg tablets 2 q.a.m. until better, and then      1 q.a.m. thereafter, and spent extra time educating her regarding      dietary issues.  2.  For her sore throat, I have empirically recommended increasing Protonix      up to a 40 mg tablet b.i.d., continuing Magic Mouthwash and Endal HG 2      tsp q.4 h. p.r.n. cough.  3.  For her diabetes I recommended Glucotrol 5 mg q.a.m. and add a second      dose in the morning if her blood sugar in the fasting is greater than      150.  4.  I have also empirically continued Singulair 10 mg q.p.m., though I do      not really it has anything at all to offer here.  5.  She has also been placed on Elavil 25 mg 1/2 q.h.s., which I have asked      her to continue.   I went over all of these issues in writing with her, made sure she could  read them back to me, and also dietary issues as noted above.                                   Charlaine Dalton. Sandra Sires, MD, [facility]   MBW/MedQ  DD:  05/29/2006  DT:  05/30/2006  Job #:  [id number]

## 2011-03-21 NOTE — Assessment & Plan Note (Signed)
[facility]                               PULMONARY OFFICE NOTE   NAME:[name], [name]                       MRN:          [id number]  DATE:05/19/2006                            DOB:          10/16/1966    HISTORY:  This is a pulmonary/acute office evaluation.  A 45 year old black  female just seen on May 15, 2006, with chronic cough and evidence of hilar  adenopathy by chest CT scan consistent with sarcoid.  Tentatively, I have  scheduled a bronchoscopy for May 21, 2006, but she comes in today acutely  worse over the last several days with a sore tongue and throat and disclosed  for the first time that she is actually maintained on ACE inhibitors, albeit  only at 2.5 mg of Lisinopril a day.  She also takes Protonix daily.  She  denies fevers, chills, sweats, purulent sputum, ocular or articular  complaints.   PHYSICAL EXAMINATION:  GENERAL:  She is a pleasant, ambulatory, obese, black  female in no acute distress.  VITAL SIGNS:  Blood pressure is only 118/70, pulse rate 71 and regular.  HEENT:  Reveals mild thrush and mild erythema of the oropharynx.  No  definite purulent secretions.  NECK:  Supple without cervical adenopathy or tenderness.  Trachea is  midline.  LUNGS:  Fields perfectly clear bilaterally auscultation/percussion.  HEART:  Regular rhythm.  No murmur, gallop, rub.  ABDOMEN:  Soft, obese but benign.  EXTREMITIES:  Warm without calf tenderness, cyanosis, or clubbing.   LABORATORY DATA:  Reviewed from May 15, 2006, indicates normal CBC, LFTs,  and an ACE level of 85.   IMPRESSION:  1.  This patient most likely does have sarcoid as a cause of both her      adenopathy and elevated ACE.  I recommended proceeding with      bronchoscopy.  2.  However, her acute upper airway symptoms now are probably related to      nonspecific pharyngitis and/or Candidiasis but with markedly      disproportionate symptoms due to the combination of  reflux, her obesity,      and ACE inhibitors.  I have asked her to stop the ACE inhibitors.  Start      taking magic mouthwash 2 teaspoons q.i.d.  Continue Protonix before      meals  perfectly regularly and gave her Endal HD for cough 2 teaspoons      every 4 hours.   Unless her condition deteriorates significantly, I see no reason to postpone  proceeding with bronchoscopy on May 21, 2006.                                  Charlaine Dalton. Sherene Sires, MD, [facility]   MBW/MedQ  DD:  05/19/2006  DT:  05/19/2006  Job #:  [id number]   cc:   Lavonda Jumbo, MD

## 2011-03-21 NOTE — Assessment & Plan Note (Signed)
[facility]                               PULMONARY OFFICE NOTE   NAME:[name], [name]                       MRN:          [id number]  DATE:07/03/2006                            DOB:          19-Dec-1965    HISTORY OF PRESENT ILLNESS:  This is a 45 year old African-American female  patient of Dr. Thurston Hole with a history of suspected sarcoid with classic  mediastinal and hilar changes on x-ray and classic cobblestoning pattern,  left greater than right, on bronchoscopy on July 19th.  The patient presents  for a one month followup today.  Since last visit, the patient had been  placed on prednisone 20 mg, along with Singulair daily.  Patient reports  that her shortness of breath and fatigue have decreased but not totally  resolved.  Complains over the last week that she has developed an acne-like  rash along her face, that is somewhat pruritic.  She denies any increased  cough, purulent sputum, hemoptysis, chest pain, or orthopnea.   PAST MEDICAL HISTORY:  Reviewed.   CURRENT MEDICATIONS:  Reviewed.   PHYSICAL EXAMINATION:  VITAL SIGNS:  She is afebrile with stable vital  signs.  O2 saturation is 98% on room air.  GENERAL:  Patient is a pleasant female in no acute distress.  HEENT:  Posterior pharynx is clear.  NECK:  Supple without adenopathy.  No JVD.  LUNGS:  Lung sounds are clear without any wheezing or crackles.  CARDIAC:  Regular rate and rhythm.  ABDOMEN:  Soft and obese.  EXTREMITIES:  Warm without any edema.  SKIN:  Multiple scattered papules along the face.  No redness is noted.   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  Patient does seem to be      slightly improved and will decrease prednisone down to 10, alternating      with 20, until seen back in four weeks or sooner if needed.  2. Facial rash, questionable etiology, possibly an acne-like rash      secondary to steroids.  Hopefully with decreasing  prednisone, this will      improve.  She has been advised to use over-the-counter acne wash.      Patient is advised that if this does not improve or worsen, she will      need referral to dermatology for evaluation.                                   Rubye Oaks, NP                                Charlaine Dalton. Sherene Sires, MD, [facility]   TP/MedQ  DD:  07/07/2006  DT:  07/07/2006  Job #:  [id number]

## 2011-03-21 NOTE — Assessment & Plan Note (Signed)
[facility]                             PULMONARY OFFICE NOTE   NAME:[name], [name]                       MRN:          [id number]  DATE:11/20/2006                            DOB:          02-13-1966    HISTORY:  A 45 year old, black female with clinical diagnosed sarcoid  made in September 2007 based on a bronchoscopy showing severe  cobblestoning but with a limited biopsy due to bleeding.  The patient  had a dramatic improvement in all of her symptoms on prednisone on her  last visit and I had encouraged her to reduce the dose down to 10 mg 1/2  on even days only and return for followup every 6 weeks.  However, she  missed her last 2 appointments and comes in today stating that she is  worse because she can no longer see out of her right eye.  She said  she only sees black.  She mentions she already has an eye doctor who  knows she has sarcoid, in fact, states that he reported to her I can't  do anything for you because you have sarcoid.  I do not have any record  of her ophthalmology evaluation here however.   She denies any headache, fever, significant cough, or dyspnea.   PHYSICAL EXAMINATION:  She is a chronically ill, obese, black female in  no acute distress, weighing 212 pounds.  There is no change in baseline.  HEENT:  She does, indeed, appear to have loss of vision in the right  eye.  Funduscopy does not reveal any obvious corneal or retinal  abnormality however.  NECK:  Supple without cervical adenopathy or tenderness.  Trachea is  midline with no thyromegaly.  Lung fields are completely clear bilaterally to auscultation and  percussion.  There is a regular rhythm without murmur, gallop, or rub.  ABDOMEN:  Soft, benign.  EXTREMITIES:  Warm without calf tenderness, cyanosis, or clubbing.   Heme-saturation 98% on room air.   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  I have  asked her to have her husband drive  her directly over to her eye  doctor's office and, if her eye doctor's office is not open, to go  directly to the emergency room for ophthalmology evaluation today.   In the meantime, I believe it is appropriate to continue the prednisone  at a floor of 10 mg per day.  But, if higher doses are necessary for  ocular purpose, then that is certainly reasonable.   She tells me she is under the care of another physician at the [facility].  I would like to, however, continue to follow her for the  sarcoid at least every 2 weeks with a chest x-ray on her next visit.     Charlaine Dalton. Sherene Sires, MD, [facility]  Electronically Signed   MBW/MedQ  DD: 11/20/2006  DT: 11/20/2006  Job #: [id number]

## 2011-03-21 NOTE — Assessment & Plan Note (Signed)
[facility]                               PULMONARY OFFICE NOTE   NAME:[name], [name]                       MRN:          [id number]  DATE:07/22/2006                            DOB:          1966/09/15    HISTORY:  A 45 year old black female never smoker with cough starting back  in May 2007 associated with plastic sarcoid changes by chest x-ray and  moderate elevation of ACE level combined with classic cobblestoning of the  airway by bronchoscopy on July 19 with limited biopsies because of bleeding  but a classic pattern nevertheless for sarcoid which has responded very  nicely to prednisone. She is tapered now down to 5 mg a day with no  significant increasing cough or dyspnea, chest pain, fevers, chills, sweats,  orthopnea, PND, leg pain, ocular or articular complaints. Her course has  been complicated by an acne-like rash that was felt to be secondary to  steroids, which has in fact improved as the prednisone has been tapered.   Medications reviewed with the patient in detail and recorded on the face  sheet on July 22, 2006. She says she rarely uses albuterol maybe once  or twice a week with no nocturnal need and has been taking Glucotrol 10 mg  q.a.m. with CBGs typically in the 100 to 150 range.   PHYSICAL EXAMINATION:  GENERAL:  She is a pleasant ambulatory black female  in no acute distress.  VITAL SIGNS:  Normal vital signs.  HEENT:  Unremarkable. Pharynx clear.  LUNGS:  Lung fields are completely clear bilaterally to auscultation and  percussion.  HEART:  Regular rhythm without murmur, gallop or rub.  ABDOMEN:  Soft, benign.  EXTREMITIES:  Warm without calf tenderness, cyanosis, clubbing or edema.   IMPRESSION:  Classic sarcoid by both chest x-ray, CT scanning, bronchoscopy  and in every regard except for the fact that she had a negative biopsy,  albeit with limited sampling due to bleeding at the time of her biopsy.   RECOMMENDATIONS:  Continue to taper the prednisone looking for any flareup  of her original symptoms of cough or increase in ACE level over baseline.   FOLLOWUP:  In 6 weeks with repeat chest x-ray and ACE level then.                                   Charlaine Dalton. Sherene Sires, MD, [facility]   MBW/MedQ  DD:  07/22/2006  DT:  07/24/2006  Job #:  [id number]   cc:   Devoria Albe, M.D.

## 2011-03-21 NOTE — Assessment & Plan Note (Signed)
[name]                               PULMONARY OFFICE NOTE   NAME:[name], [name]                       MRN:          [id number]  DATE:09/02/2006                            DOB:          May 24, 1966    HISTORY:  This is a 45 year old black female with a clinical diagnosis of  sarcoid made in September 2007 based on bronchoscopy showing severe  cobblestoning but limited biopsy because of bleeding that was nonspecific.  The patient has had dramatic improvement in all of her symptoms on  prednisone, and now has been able to taper down to 10 mg tablets, 1/2 q.a.m.  She misunderstood the instructions on Prilosec and had been taking it at  bedtime.  She has trouble actually naming any of her medicines and is also  having trouble correlating instructions against the actual tablets.  She  does have albuterol, states she has not needed it since her previous visit.  However, overall she is doing well except for an occasional feeling of hot  in her stomach.  She specifically denies any ocular or articular  complaints, fevers, chills, sweats, cough, or dyspnea.   ON PHYSICAL EXAMINATION:  GENERAL:  She is an obese black female in no acute  distress.  VITAL SIGNS:  Weight 214 pounds, which is no change in baseline.  HEENT:  Unremarkable.  OROPHARYNX:  Clear.  LUNG FIELDS:  Perfectly clear bilaterally to auscultation and percussion.  HEART:  Regular rate and rhythm without murmur, gallop, or rub.  ABDOMEN:  Soft, benign.  EXTREMITIES:  Warm without calf tenderness, cyanosis, clubbing, or edema.   Chest x-ray is basically normal except for minimal mediastinal hilar  adenopathy.   IMPRESSION:  Dramatic response radiographically and clinically to prednisone  for sarcoid associated with symptoms initially of cough and also prominent  hilar adenopathy as well as an ACE level of 85.   RECOMMENDATIONS:  Repeat ACE level today along with a CMET.  I will also  do  a hemoglobin A1c to be complete since she is going to see her primary  physicians this week for followup and asked her to try prednisone 10 mg  tablets, 1/2 every other day.   I had an extended discussion with this patient regarding medication  reconciliation issues.  I gave her written instructions on this occasion  also and then reviewed them line by line to make sure there was 100%  correlation between the instructions and what she actually does at home in  terms of medication self-administration noting the previous confusion  regarding omeprazole and prednisone dosing.   Specifically I recommended the following:  1. Prednisone 10 mg, 1/2 on even days only.  2. Glucotrol 10 mg q.a.m. pending review by Dr. Lynelle Doctor.  3. Omeprazole 20 mg tablets, 30-60 minutes before her first meal every      day.  4. Citrucel empirically at 1 teaspoon b.i.d. for complaints of chronic      constipation.  5. If she is having any wheezing or dyspnea, she has the ability to take  Albuterol 2 puffs every 4 hours p.r.n.    ______________________________  Sandra Dalton. Sherene Sires, MD, [facility]    MBW/MedQ  DD: 09/02/2006  DT: 09/02/2006  Job #: [id number]   cc:   Devoria Albe, M.D.

## 2011-03-21 NOTE — Discharge Summary (Signed)
[name], [name]                ACCOUNT NO.:  [id number]   MEDICAL RECORD NO.:  [id number]          PATIENT TYPE:  INP   LOCATION:  0165                         FACILITY:  [facility]   PHYSICIAN:  Coralyn Helling, M.D.      DATE OF BIRTH:  1966/10/20   DATE OF ADMISSION:  05/21/2006  DATE OF DISCHARGE:  05/23/2006                                 DISCHARGE SUMMARY   DISCHARGE DIAGNOSES:  Hypoxic respiratory failure, status post fiberoptic  bronchoscopy secondary to alveolar bleeding, upper airway obstruction  secondary to ACE inhibitor, and post bronchoscopy fever, as well as  transient hypotension.   HOSPITAL SUMMARY:  [name] is a 45 year old African-American female who  was evaluated by Dr. Sherene Sires for cough of two months duration.  She had a chest  x-ray and CT scan which was suggestive of adenopathy and as a result she was  scheduled for fiberoptic bronchoscopy.  She was noted to have an elevated  ACE level as well, and there was a concern she may have sarcoidosis.  She  was admitted to the intensive care unit for close monitoring post  bronchoscopy and remained relatively stable.  She was started on Solu-Medrol  for possible sarcosis as well as upper airway obstruction possibly related  to ACE inhibitor.  She continued to improve and was able to ambulate in the  hallway without dyspnea.   DISCHARGE MEDICATIONS:  1.  Avalox 400 mg p.o. daily x3 days.  2.  Glucotrol 5 mg p.o. daily.  3.  Protonix 40 mg p.o. b.i.d.  4.  Prednisone taper over 5 days.   DISPOSITION:  She is to be discharged home, and she will have followup with  Dr. Sherene Sires within one to two weeks to discuss her biopsy results as well as to  assess continued need for prednisone therapy.      Coralyn Helling, M.D.  Electronically Signed     VS/MEDQ  D:  05/23/2006  T:  05/23/2006  Job:  [id number]

## 2011-03-21 NOTE — Assessment & Plan Note (Signed)
[facility]                             PULMONARY OFFICE NOTE   NAME:LITTLEMyrian, Botello                       MRN:          [id number]  DATE:12/04/2006                            DOB:          11/15/1965    HISTORY:  A 45 year old black female with a clinical diagnosis of  sarcoid made in September 2007 based on the appearance by bronchoscopy  of severe cobblestoning, but limited biopsies because of bleeding at the  time of the biopsy.  She had dramatic improvement in her respiratory  symptoms on prednisone, and saw me on November 20, 2006, down to 10 mg  1/2 on even days only, after missing several of her appointments and  then developing ocular symptoms.  She is subsequently under the care now  of both Ophthalmology and Dr. Thad Ranger, receiving cortisone  injections, and does not know what to do with her prednisone, and comes  back to day for followup.  Again, she has no significant fevers, chills,  sweats, dyspnea or cough.   PHYSICAL EXAMINATION:  She is an obese, ambulatory, black female, in no  acute distress.  She had stable vital signs.  HEENT:  Unremarkable.  Oropharynx clear.  LUNG FIELDS:  Clear bilaterally to auscultation and percussion.  There was palpable adenopathy.  There was no S3, murmur, gallop, rub or increase in P2.  ABDOMEN:  Soft and benign, but obese.  EXTREMITIES:  Warm without calf tenderness, cyanosis, clubbing or edema.   Chest x-ray shows no significant residual interstitial changes or  significant adenopathy.   IMPRESSION:  1. No evidence of pulmonary sarcoid.  2. 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  Since Dr. Thad Ranger is now      giving her injections of cortisone, I am going to defer completely      to his judgment how to use systemic steroids both in oral and      injected form.  Pulmonary followup can, therefore, be on a p.r.n.      basis.     Charlaine Dalton. Sherene Sires, MD, [facility]  Electronically Signed    MBW/MedQ  DD: 12/04/2006  DT: 12/04/2006  Job #: [id number]   cc:   Lavonda Jumbo, M.D.  Michael L. Thad Ranger, M.D.

## 2011-04-13 ENCOUNTER — Emergency Department (HOSPITAL_COMMUNITY)
Admission: EM | Admit: 2011-04-13 | Discharge: 2011-04-14 | Disposition: A | Discharge status: Home / Self Care | Payer: Medicaid Other | Attending: Emergency Medicine | Admitting: Emergency Medicine

## 2011-04-13 DIAGNOSIS — R51 Headache: Secondary | ICD-10-CM | POA: Insufficient documentation

## 2011-04-13 DIAGNOSIS — K219 Gastro-esophageal reflux disease without esophagitis: Secondary | ICD-10-CM | POA: Insufficient documentation

## 2011-04-13 DIAGNOSIS — M79609 Pain in unspecified limb: Secondary | ICD-10-CM | POA: Insufficient documentation

## 2011-04-13 DIAGNOSIS — R0989 Other specified symptoms and signs involving the circulatory and respiratory systems: Secondary | ICD-10-CM | POA: Insufficient documentation

## 2011-04-13 DIAGNOSIS — D869 Sarcoidosis, unspecified: Secondary | ICD-10-CM | POA: Insufficient documentation

## 2011-04-13 DIAGNOSIS — R0609 Other forms of dyspnea: Secondary | ICD-10-CM | POA: Insufficient documentation

## 2011-04-13 DIAGNOSIS — E119 Type 2 diabetes mellitus without complications: Secondary | ICD-10-CM | POA: Insufficient documentation

## 2011-04-13 DIAGNOSIS — I10 Essential (primary) hypertension: Secondary | ICD-10-CM | POA: Insufficient documentation

## 2011-04-13 DIAGNOSIS — R079 Chest pain, unspecified: Secondary | ICD-10-CM | POA: Insufficient documentation

## 2011-04-13 DIAGNOSIS — E669 Obesity, unspecified: Secondary | ICD-10-CM | POA: Insufficient documentation

## 2011-04-13 DIAGNOSIS — Z79899 Other long term (current) drug therapy: Secondary | ICD-10-CM | POA: Insufficient documentation

## 2011-04-14 ENCOUNTER — Emergency Department (HOSPITAL_COMMUNITY): Payer: Medicaid Other

## 2011-04-14 LAB — CBC
MCH: 28.5 pg (ref 26.0–34.0)
MCHC: 33.2 g/dL (ref 30.0–36.0)
MCV: 85.9 fL (ref 78.0–100.0)
Platelets: 321 10*3/uL (ref 150–400)

## 2011-04-14 LAB — CK TOTAL AND CKMB (NOT AT ARMC): Total CK: 107 U/L (ref 7–177)

## 2011-04-14 LAB — BASIC METABOLIC PANEL
CO2: 26 mEq/L (ref 19–32)
Calcium: 9.1 mg/dL (ref 8.4–10.5)
Chloride: 104 mEq/L (ref 96–112)
Glucose, Bld: 297 mg/dL — ABNORMAL HIGH (ref 70–99)
Sodium: 138 mEq/L (ref 135–145)

## 2011-04-14 LAB — DIFFERENTIAL
Basophils Relative: 0 % (ref 0–1)
Eosinophils Absolute: 0.1 10*3/uL (ref 0.0–0.7)
Monocytes Absolute: 0.7 10*3/uL (ref 0.1–1.0)
Monocytes Relative: 7 % (ref 3–12)

## 2011-05-31 ENCOUNTER — Emergency Department (HOSPITAL_COMMUNITY)
Admission: EM | Admit: 2011-05-31 | Discharge: 2011-05-31 | Discharge status: Against Medical Advice | Payer: Medicaid Other | Attending: Emergency Medicine | Admitting: Emergency Medicine

## 2011-05-31 DIAGNOSIS — Z0389 Encounter for observation for other suspected diseases and conditions ruled out: Secondary | ICD-10-CM | POA: Insufficient documentation

## 2011-06-21 ENCOUNTER — Emergency Department (HOSPITAL_COMMUNITY): Payer: Medicaid Other

## 2011-06-21 ENCOUNTER — Emergency Department (HOSPITAL_COMMUNITY)
Admission: EM | Admit: 2011-06-21 | Discharge: 2011-06-21 | Disposition: A | Discharge status: Home / Self Care | Payer: Medicaid Other | Attending: Emergency Medicine | Admitting: Emergency Medicine

## 2011-06-21 DIAGNOSIS — E119 Type 2 diabetes mellitus without complications: Secondary | ICD-10-CM | POA: Insufficient documentation

## 2011-06-21 DIAGNOSIS — I10 Essential (primary) hypertension: Secondary | ICD-10-CM | POA: Insufficient documentation

## 2011-06-21 DIAGNOSIS — D869 Sarcoidosis, unspecified: Secondary | ICD-10-CM | POA: Insufficient documentation

## 2011-06-21 DIAGNOSIS — Z794 Long term (current) use of insulin: Secondary | ICD-10-CM | POA: Insufficient documentation

## 2011-06-21 DIAGNOSIS — R209 Unspecified disturbances of skin sensation: Secondary | ICD-10-CM | POA: Insufficient documentation

## 2011-06-21 LAB — CBC
MCV: 85 fL (ref 78.0–100.0)
Platelets: 276 10*3/uL (ref 150–400)
RBC: 4.13 MIL/uL (ref 3.87–5.11)
RDW: 16.8 % — ABNORMAL HIGH (ref 11.5–15.5)
WBC: 9.9 10*3/uL (ref 4.0–10.5)

## 2011-06-21 LAB — POCT I-STAT TROPONIN I: Troponin i, poc: 0.01 ng/mL (ref 0.00–0.08)

## 2011-06-21 LAB — BASIC METABOLIC PANEL
CO2: 25 mEq/L (ref 19–32)
Chloride: 103 mEq/L (ref 96–112)
GFR calc Af Amer: >60 mL/min (ref >60)
Potassium: 4.2 mEq/L (ref 3.5–5.1)
Sodium: 136 mEq/L (ref 135–145)

## 2011-06-21 LAB — DIFFERENTIAL
Basophils Absolute: 0.1 10*3/uL (ref 0.0–0.1)
Basophils Relative: 1 % (ref 0–1)
Eosinophils Absolute: 0.1 10*3/uL (ref 0.0–0.7)
Eosinophils Relative: 1 % (ref 0–5)
Lymphs Abs: 2.5 10*3/uL (ref 0.7–4.0)
Neutrophils Relative %: 68 % (ref 43–77)

## 2011-06-21 LAB — GLUCOSE, CAPILLARY

## 2011-06-26 ENCOUNTER — Ambulatory Visit (INDEPENDENT_AMBULATORY_CARE_PROVIDER_SITE_OTHER): Payer: Medicaid Other

## 2011-06-26 ENCOUNTER — Inpatient Hospital Stay (INDEPENDENT_AMBULATORY_CARE_PROVIDER_SITE_OTHER)
Admission: RE | Admit: 2011-06-26 | Discharge: 2011-06-26 | Disposition: A | Discharge status: Home / Self Care | Payer: Medicaid Other | Source: Ambulatory Visit | Attending: Family Medicine | Admitting: Family Medicine

## 2011-06-26 DIAGNOSIS — S93609A Unspecified sprain of unspecified foot, initial encounter: Secondary | ICD-10-CM

## 2011-06-26 DIAGNOSIS — S93409A Sprain of unspecified ligament of unspecified ankle, initial encounter: Secondary | ICD-10-CM

## 2011-07-31 LAB — CBC
HCT: 39.5
Hemoglobin: 13.6
MCV: 94.5
Platelets: 246
RBC: 4.18
WBC: 7.9

## 2011-07-31 LAB — POCT CARDIAC MARKERS
CKMB, poc: <1 — ABNORMAL LOW
CKMB, poc: <1 — ABNORMAL LOW
Myoglobin, poc: 33.6
Myoglobin, poc: 36
Operator id: 277751
Operator id: 277751
Troponin i, poc: <0.05
Troponin i, poc: <0.05

## 2011-07-31 LAB — DIFFERENTIAL
Basophils Relative: 1
Eosinophils Absolute: 0.1
Eosinophils Relative: 1
Monocytes Absolute: 0.5
Monocytes Relative: 6

## 2011-08-04 LAB — CBC
MCV: 95.2
Platelets: 299
WBC: 6.1

## 2011-08-04 LAB — PROTIME-INR
INR: 0.9
Prothrombin Time: 12.1

## 2011-08-04 LAB — SAMPLE TO BLOOD BANK

## 2011-08-04 LAB — POCT I-STAT, CHEM 8
BUN: 7
Calcium, Ion: 1.14
TCO2: 23

## 2011-08-08 LAB — BASIC METABOLIC PANEL
BUN: 12
Creatinine, Ser: 0.73
GFR calc non Af Amer: >60

## 2011-08-08 LAB — CBC
Platelets: 233
WBC: 6.1

## 2011-08-08 LAB — URINALYSIS, ROUTINE W REFLEX MICROSCOPIC
Glucose, UA: >1000 — AB
Hgb urine dipstick: NEGATIVE
Ketones, ur: NEGATIVE
Protein, ur: NEGATIVE

## 2011-08-08 LAB — URINE CULTURE

## 2011-08-08 LAB — DIFFERENTIAL
Eosinophils Absolute: 0.2
Lymphocytes Relative: 27
Lymphs Abs: 1.7
Neutrophils Relative %: 61

## 2012-06-26 IMAGING — CR DG ANKLE COMPLETE 3+V*L*
3 series · 3 of 3 positions shown · non-contrast
Comparison: None

CLINICAL DATA: Fell.  Left ankle pain.

LEFT ANKLE COMPLETE - 3+ VIEW

[view not recorded (1 of 3)]
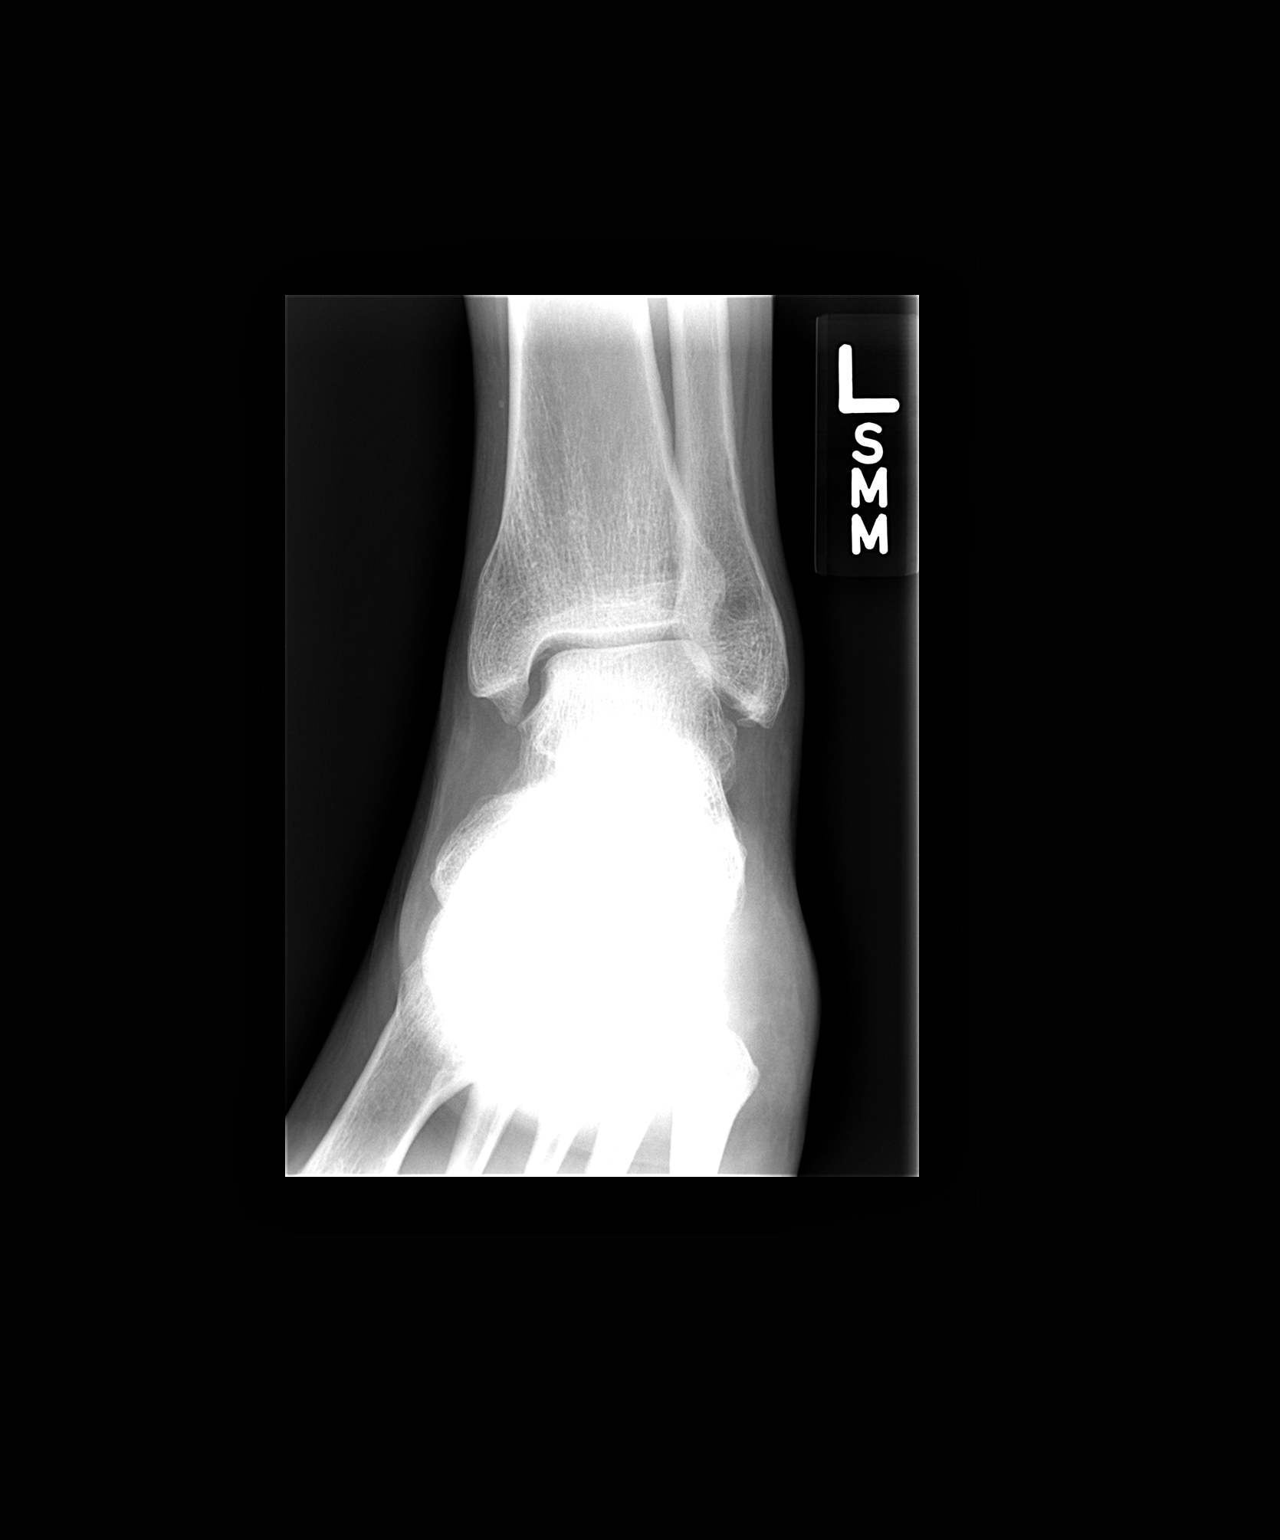

[view not recorded (2 of 3)]
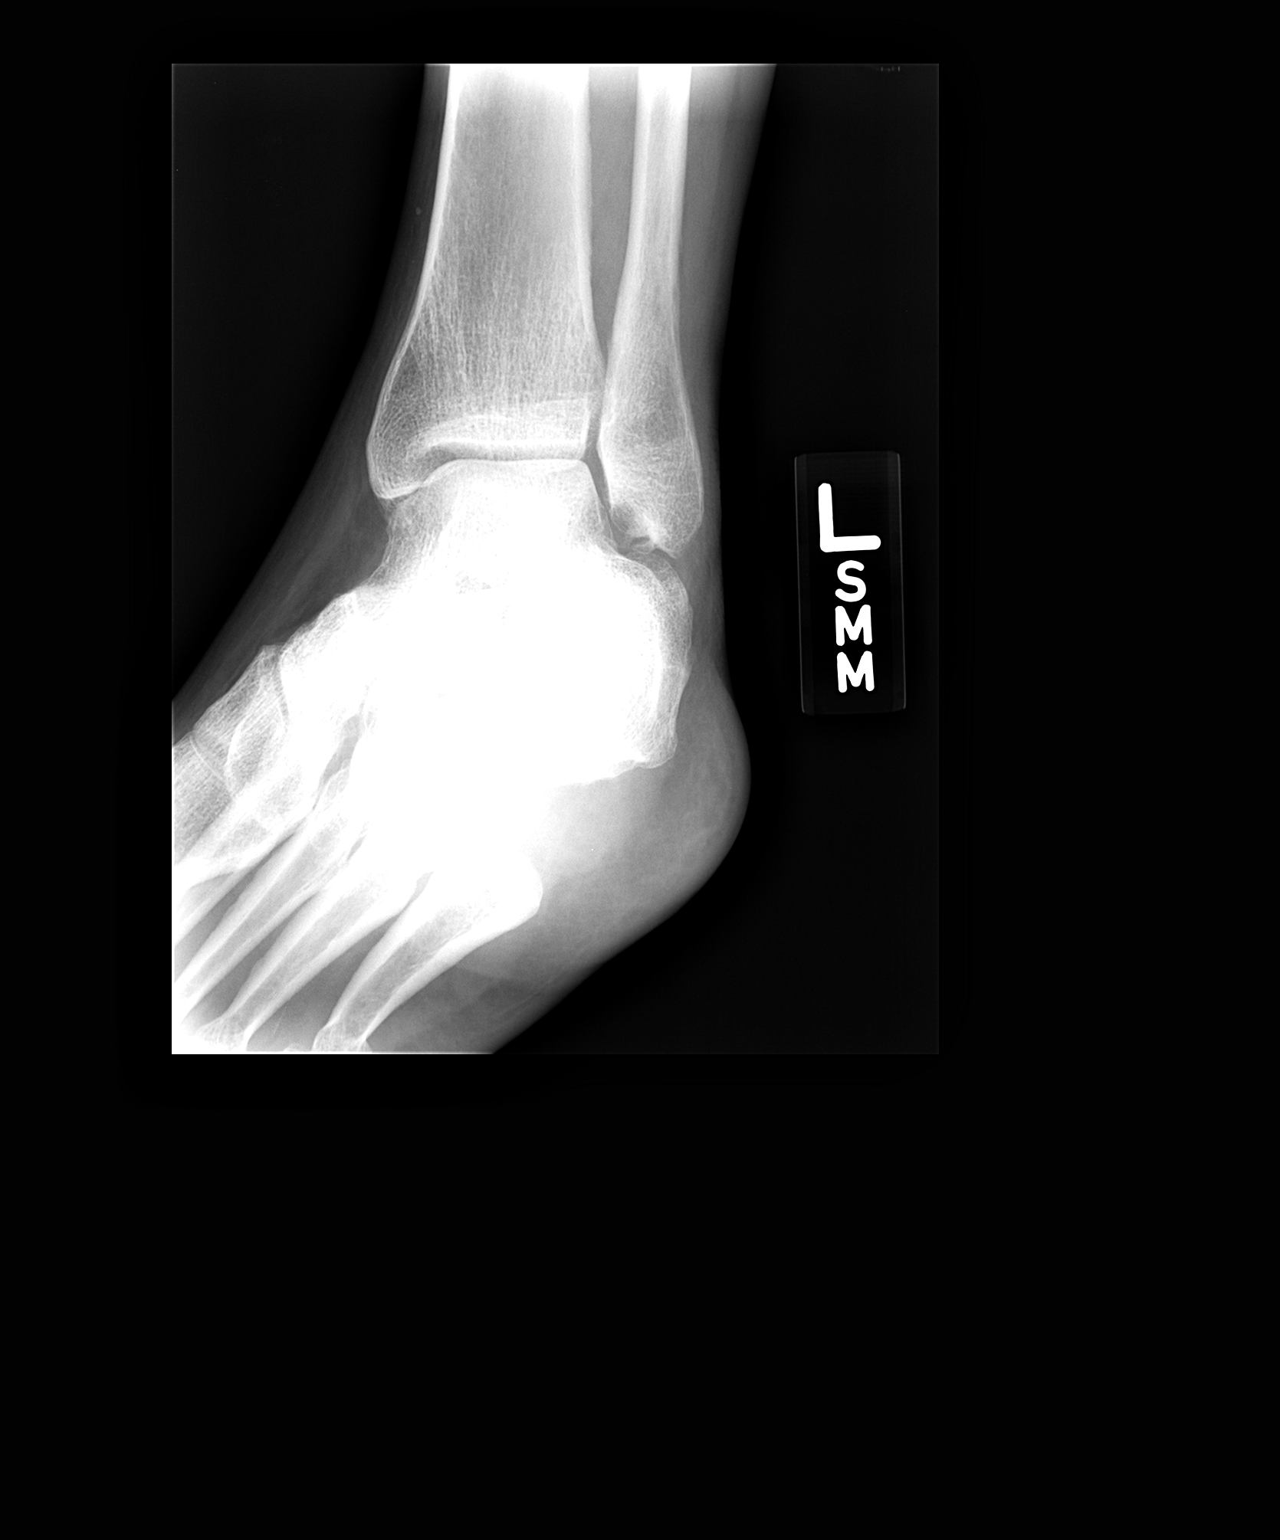

[view not recorded (3 of 3)]
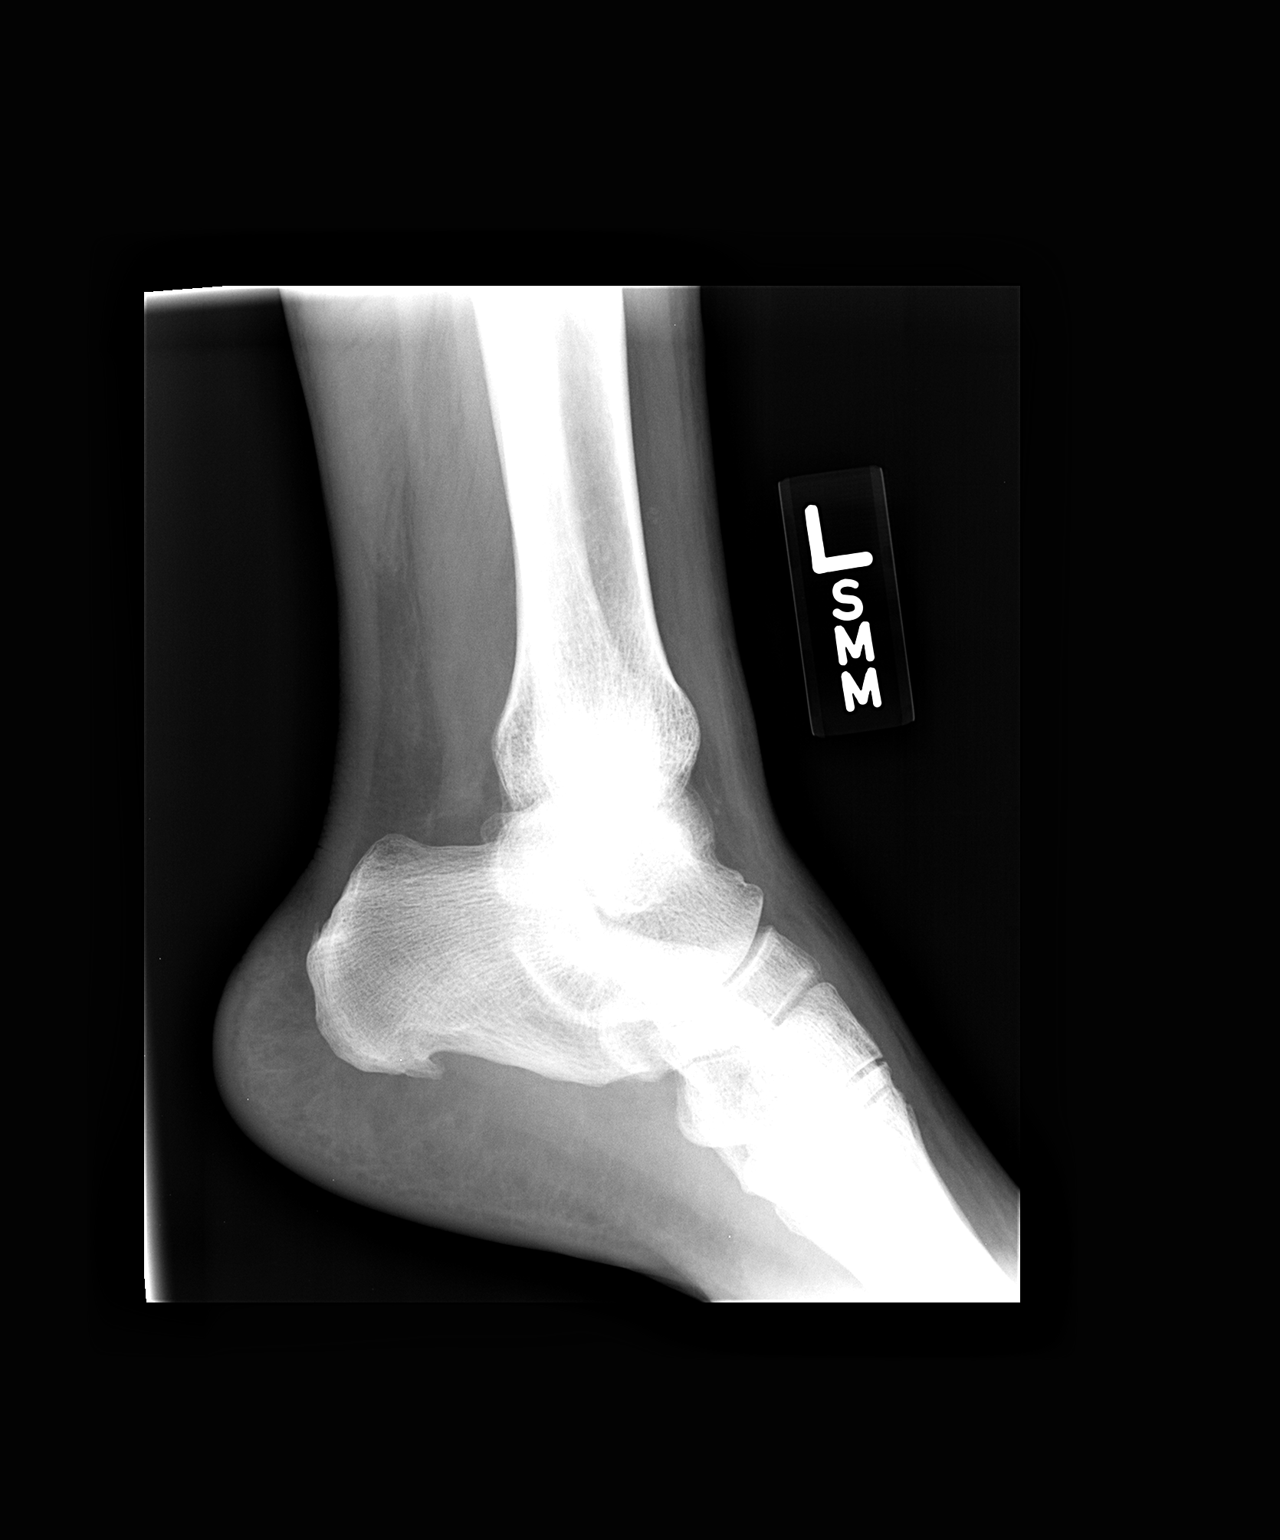

[3 of 3 positions shown; findings below may reference images not displayed]

FINDINGS: The ankle mortise is maintained.  No acute fracture.  No
osteochondral lesions.  A calcaneal heel spur is noted.
IMPRESSION: No acute bony findings.

## 2012-07-26 ENCOUNTER — Emergency Department (HOSPITAL_COMMUNITY): Payer: Medicaid Other

## 2012-07-26 ENCOUNTER — Emergency Department (HOSPITAL_COMMUNITY)
Admission: EM | Admit: 2012-07-26 | Discharge: 2012-07-26 | Disposition: A | Discharge status: Home / Self Care | Payer: Medicaid Other | Attending: Emergency Medicine | Admitting: Emergency Medicine

## 2012-07-26 ENCOUNTER — Encounter (HOSPITAL_COMMUNITY): Payer: Self-pay

## 2012-07-26 DIAGNOSIS — R0602 Shortness of breath: Secondary | ICD-10-CM | POA: Insufficient documentation

## 2012-07-26 DIAGNOSIS — D869 Sarcoidosis, unspecified: Secondary | ICD-10-CM

## 2012-07-26 DIAGNOSIS — F411 Generalized anxiety disorder: Secondary | ICD-10-CM | POA: Insufficient documentation

## 2012-07-26 DIAGNOSIS — Z886 Allergy status to analgesic agent status: Secondary | ICD-10-CM | POA: Insufficient documentation

## 2012-07-26 DIAGNOSIS — I10 Essential (primary) hypertension: Secondary | ICD-10-CM | POA: Insufficient documentation

## 2012-07-26 DIAGNOSIS — F419 Anxiety disorder, unspecified: Secondary | ICD-10-CM

## 2012-07-26 DIAGNOSIS — E119 Type 2 diabetes mellitus without complications: Secondary | ICD-10-CM | POA: Insufficient documentation

## 2012-07-26 HISTORY — DX: Anxiety disorder, unspecified: F41.9

## 2012-07-26 HISTORY — DX: Sarcoidosis, unspecified: D86.9

## 2012-07-26 LAB — GLUCOSE, CAPILLARY: Glucose-Capillary: 146 mg/dL — ABNORMAL HIGH (ref 70–99)

## 2012-07-26 MED ORDER — ALBUTEROL SULFATE HFA 108 (90 BASE) MCG/ACT IN AERS
1.0000 | INHALATION_SPRAY | Freq: Four times a day (QID) | RESPIRATORY_TRACT | Status: DC | PRN
  Administered 2012-07-26: 1 via RESPIRATORY_TRACT
  Filled 2012-07-26: qty 6.7

## 2012-07-26 MED ORDER — ALBUTEROL SULFATE (5 MG/ML) 0.5% IN NEBU
5.0000 mg | INHALATION_SOLUTION | Freq: Once | RESPIRATORY_TRACT | Status: AC
  Administered 2012-07-26: 5 mg via RESPIRATORY_TRACT
  Filled 2012-07-26: qty 40

## 2012-07-26 MED ORDER — CARVEDILOL 3.125 MG PO TABS: 3.1250 mg | ORAL_TABLET | Freq: Two times a day (BID) | ORAL | Status: DC

## 2012-07-26 MED ORDER — PREDNISONE 20 MG PO TABS: 40.0000 mg | ORAL_TABLET | Freq: Every day | ORAL | Status: DC

## 2012-07-26 MED ORDER — LORAZEPAM 1 MG PO TABS
1.0000 mg | ORAL_TABLET | Freq: Once | ORAL | Status: AC
  Administered 2012-07-26: 1 mg via ORAL
  Filled 2012-07-26: qty 1

## 2012-07-26 MED ORDER — BENAZEPRIL HCL 20 MG PO TABS: 20.0000 mg | ORAL_TABLET | Freq: Every day | ORAL | Status: DC

## 2012-07-26 MED ORDER — LORAZEPAM 0.5 MG PO TABS: 0.5000 mg | ORAL_TABLET | Freq: Four times a day (QID) | ORAL | Status: DC | PRN

## 2012-07-26 MED ORDER — IPRATROPIUM BROMIDE 0.02 % IN SOLN
0.5000 mg | Freq: Once | RESPIRATORY_TRACT | Status: AC
  Administered 2012-07-26: 0.5 mg via RESPIRATORY_TRACT
  Filled 2012-07-26: qty 2.5

## 2012-07-26 NOTE — ED Notes (Signed)
EMS reports pt woke this am feeling stressed, got worse thru the day, SOB and chest pain with the anxiety attack, headache, hx of anxiety,

## 2012-07-26 NOTE — ED Provider Notes (Addendum)
History     CSN: [id number]  Arrival date & time 07/26/12  1600   First MD Initiated Contact with Patient 07/26/12 1613      No chief complaint on file.   (Consider location/radiation/quality/duration/timing/severity/associated sxs/prior treatment) Patient is a 46 y.o. female presenting with anxiety and shortness of breath. The history is provided by the patient.  Anxiety This is a recurrent problem. The current episode started 6 to 12 hours ago. The problem occurs constantly. The problem has been gradually improving. Associated symptoms include shortness of breath. Associated symptoms comments: Feeling like throat is closing up, SOB, fear and doom.  Prior hx of panic attacks and no longer on meds. The symptoms are aggravated by stress. Nothing relieves the symptoms. She has tried nothing for the symptoms. The treatment provided mild relief.  Shortness of Breath  Episode onset: chronic due to sarcoidosis. The onset is undetermined. The problem occurs frequently. The problem has been unchanged. The problem is moderate. The symptoms are relieved by rest. The symptoms are aggravated by activity. Associated symptoms include shortness of breath and wheezing. Pertinent negatives include no fever, no rhinorrhea and no cough. Associated symptoms comments: Chest tightness with breathing. She has had intermittent steroid use. She has had no prior hospitalizations. She has had no prior ICU admissions. She has had no prior intubations. She has been behaving normally. Urine output has been normal. The last void occurred less than 6 hours ago. There were no sick contacts. She has received no recent medical care.    Past Medical History  Diagnosis Date  . Anxiety   . Sarcoidosis   . Diabetes mellitus     No past surgical history on file.  No family history on file.  History  Substance Use Topics  . Smoking status: Never Smoker   . Smokeless tobacco: Not on file  . Alcohol Use: Yes    OB  History    Grav Para Term Preterm Abortions TAB SAB Ect Mult Living                  Review of Systems  Constitutional: Negative for fever.  HENT: Negative for rhinorrhea.   Respiratory: Positive for shortness of breath and wheezing. Negative for cough.   All other systems reviewed and are negative.    Allergies  Aspirin; Ibuprofen; and Morphine and related  Home Medications  No current outpatient prescriptions on file.  BP 168/96  Pulse 70  Temp 98.8 F (37.1 C) (Oral)  Resp 18  SpO2 97%  Physical Exam  Nursing note and vitals reviewed. Constitutional: She is oriented to person, place, and time. She appears well-developed and well-nourished. No distress.  HENT:  Head: Normocephalic and atraumatic.  Mouth/Throat: Oropharynx is clear and moist.  Eyes: Conjunctivae normal and EOM are normal. Pupils are equal, round, and reactive to light.  Neck: Normal range of motion. Neck supple.  Cardiovascular: Normal rate, regular rhythm and intact distal pulses.   No murmur heard. Pulmonary/Chest: Effort normal. No respiratory distress. She has decreased breath sounds. She has no wheezes. She has no rales.  Abdominal: Soft. She exhibits no distension. There is no tenderness. There is no rebound and no guarding.  Musculoskeletal: Normal range of motion. She exhibits no edema and no tenderness.  Neurological: She is alert and oriented to person, place, and time.  Skin: Skin is warm and dry. No rash noted. No erythema.  Psychiatric: Her behavior is normal. Her mood appears anxious.    ED Course  Procedures (including critical care time)  Labs Reviewed  GLUCOSE, CAPILLARY - Abnormal; Notable for the following:    Glucose-Capillary 146 (*)     All other components within normal limits   Dg Chest 2 View  07/26/2012  *RADIOLOGY REPORT*  Clinical Data: Shortness of breath and chest pain.  CHEST - 2 VIEW  Comparison: 04/14/2011  Findings: Two views of the chest demonstrate clear  lungs.  Stable appearance of the heart and mediastinum.  Trachea is midline.  Bony thorax is intact.  IMPRESSION: No acute chest findings.   Original Report Authenticated By: Richarda Overlie, M.D.      Date: 07/26/2012  Rate: 70  Rhythm: normal sinus rhythm  QRS Axis: normal  Intervals: normal  ST/T Wave abnormalities: normal  Conduction Disutrbances: none  Narrative Interpretation: unremarkable     1. Anxiety   2. Sarcoidosis   3. Shortness of breath   4. HTN (hypertension)       MDM   Patient with a history of anxiety and sarcoidosis use used to be on a medication for anxiety but has not been taking it for over 8 months. She states today she felt very nervous and then started to feel the sensation of doom and her throat closing up. She states daily she wheezes and has chest tightness which is no different today. She currently is not using any inhalers at home. She reports being under a lot more stress than usual but denies any suicidal or homicidal ideation. Patient's EKG is within normal limits and has no focal findings on exam except decreased air movement in all lung fields. Patient was given one dose of Ativan as well as albuterol and Atrovent. EKG within normal limits. I-STAT, chest x-ray pending  6:17 PM Istat wnl.   CXR wnl.  Pt  iimproved after ativan and albuterol/atrovent.  Moving air better.  Will d/c home to f/u with PCP.      Gwyneth Sprout, MD 07/26/12 1818  Gwyneth Sprout, MD 07/26/12 Rickey Primus

## 2012-07-28 LAB — POCT I-STAT, CHEM 8
BUN: 8 mg/dL (ref 6–23)
Chloride: 105 mEq/L (ref 96–112)
HCT: 33 % — ABNORMAL LOW (ref 36.0–46.0)
Sodium: 141 mEq/L (ref 135–145)
TCO2: 24 mmol/L (ref 0–100)

## 2012-11-01 ENCOUNTER — Encounter (HOSPITAL_COMMUNITY): Payer: Self-pay | Admitting: Emergency Medicine

## 2012-11-01 ENCOUNTER — Emergency Department (HOSPITAL_COMMUNITY)
Admission: EM | Admit: 2012-11-01 | Discharge: 2012-11-01 | Disposition: A | Discharge status: Home / Self Care | Payer: No Typology Code available for payment source | Attending: Emergency Medicine | Admitting: Emergency Medicine

## 2012-11-01 ENCOUNTER — Emergency Department (HOSPITAL_COMMUNITY): Payer: No Typology Code available for payment source

## 2012-11-01 DIAGNOSIS — F411 Generalized anxiety disorder: Secondary | ICD-10-CM | POA: Insufficient documentation

## 2012-11-01 DIAGNOSIS — Z794 Long term (current) use of insulin: Secondary | ICD-10-CM | POA: Insufficient documentation

## 2012-11-01 DIAGNOSIS — Z8619 Personal history of other infectious and parasitic diseases: Secondary | ICD-10-CM | POA: Insufficient documentation

## 2012-11-01 DIAGNOSIS — Y92410 Unspecified street and highway as the place of occurrence of the external cause: Secondary | ICD-10-CM | POA: Insufficient documentation

## 2012-11-01 DIAGNOSIS — E119 Type 2 diabetes mellitus without complications: Secondary | ICD-10-CM | POA: Insufficient documentation

## 2012-11-01 DIAGNOSIS — T148XXA Other injury of unspecified body region, initial encounter: Secondary | ICD-10-CM | POA: Insufficient documentation

## 2012-11-01 DIAGNOSIS — IMO0002 Reserved for concepts with insufficient information to code with codable children: Secondary | ICD-10-CM | POA: Insufficient documentation

## 2012-11-01 DIAGNOSIS — Y9389 Activity, other specified: Secondary | ICD-10-CM | POA: Insufficient documentation

## 2012-11-01 DIAGNOSIS — Z79899 Other long term (current) drug therapy: Secondary | ICD-10-CM | POA: Insufficient documentation

## 2012-11-01 MED ORDER — HYDROCODONE-ACETAMINOPHEN 5-325 MG PO TABS
1.0000 | ORAL_TABLET | Freq: Once | ORAL | Status: AC
  Administered 2012-11-01: 1 via ORAL
  Filled 2012-11-01: qty 1

## 2012-11-01 MED ORDER — HYDROCODONE-ACETAMINOPHEN 5-325 MG PO TABS: 1.0000 | ORAL_TABLET | ORAL | Status: DC | PRN

## 2012-11-01 NOTE — ED Notes (Signed)
Pt returned from X-ray.  

## 2012-11-01 NOTE — ED Notes (Signed)
Patient transported to X-ray 
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
## 2013-03-10 ENCOUNTER — Other Ambulatory Visit: Payer: Self-pay | Admitting: Internal Medicine

## 2013-03-10 DIAGNOSIS — Z1231 Encounter for screening mammogram for malignant neoplasm of breast: Secondary | ICD-10-CM

## 2013-03-30 ENCOUNTER — Encounter: Payer: Self-pay | Admitting: Obstetrics

## 2013-04-06 ENCOUNTER — Ambulatory Visit
Admission: RE | Admit: 2013-04-06 | Discharge: 2013-04-06 | Disposition: A | Discharge status: Home / Self Care | Payer: Medicaid Other | Source: Ambulatory Visit | Attending: Internal Medicine | Admitting: Internal Medicine

## 2013-04-06 DIAGNOSIS — Z1231 Encounter for screening mammogram for malignant neoplasm of breast: Secondary | ICD-10-CM

## 2013-04-07 ENCOUNTER — Other Ambulatory Visit: Payer: Self-pay | Admitting: Internal Medicine

## 2013-04-07 DIAGNOSIS — R928 Other abnormal and inconclusive findings on diagnostic imaging of breast: Secondary | ICD-10-CM

## 2013-04-20 ENCOUNTER — Ambulatory Visit
Admission: RE | Admit: 2013-04-20 | Discharge: 2013-04-20 | Disposition: A | Discharge status: Home / Self Care | Payer: Medicaid Other | Source: Ambulatory Visit | Attending: Internal Medicine | Admitting: Internal Medicine

## 2013-04-20 DIAGNOSIS — R928 Other abnormal and inconclusive findings on diagnostic imaging of breast: Secondary | ICD-10-CM

## 2013-06-27 ENCOUNTER — Ambulatory Visit: Payer: Self-pay | Admitting: Obstetrics

## 2013-08-09 ENCOUNTER — Ambulatory Visit: Payer: Self-pay | Admitting: Obstetrics

## 2013-08-18 ENCOUNTER — Ambulatory Visit: Payer: Medicaid Other | Admitting: Obstetrics

## 2013-08-29 ENCOUNTER — Ambulatory Visit (INDEPENDENT_AMBULATORY_CARE_PROVIDER_SITE_OTHER): Payer: Medicaid Other | Admitting: Obstetrics

## 2013-08-29 ENCOUNTER — Encounter: Payer: Self-pay | Admitting: Obstetrics

## 2013-08-29 VITALS — BP 172/107 | HR 74 | Temp 97.8°F | Ht 65.0 in | Wt 218.0 lb

## 2013-08-29 DIAGNOSIS — N920 Excessive and frequent menstruation with regular cycle: Secondary | ICD-10-CM | POA: Insufficient documentation

## 2013-08-29 DIAGNOSIS — Z0000 Encounter for general adult medical examination without abnormal findings: Secondary | ICD-10-CM

## 2013-08-29 DIAGNOSIS — N951 Menopausal and female climacteric states: Secondary | ICD-10-CM | POA: Insufficient documentation

## 2013-08-29 DIAGNOSIS — I10 Essential (primary) hypertension: Secondary | ICD-10-CM | POA: Insufficient documentation

## 2013-08-29 DIAGNOSIS — B9689 Other specified bacterial agents as the cause of diseases classified elsewhere: Secondary | ICD-10-CM

## 2013-08-29 MED ORDER — METRONIDAZOLE 500 MG PO TABS: 500.0000 mg | ORAL_TABLET | Freq: Two times a day (BID) | ORAL | Status: DC

## 2013-08-29 NOTE — Progress Notes (Signed)
Subjective:     [name] is a 47 y.o. female here for a routine exam.  Current complaints: Patient is in the office for annual exam- patient is in the office for pelvic symptoms- bloating ,pelvic pain, and heavy bleeding.Patient c/o headache and dizziness- elevated BP.  On BP meds but not taking.  Also c/o hot flashes that are getting worse.  Personal health questionnaire reviewed: yes.   Gynecologic History Patient's last menstrual period was 08/03/2013. Contraception: tubal ligation Last Pap: 3 years. Results were: normal Last mammogram: 1 month age. Results were: normal  Obstetric History OB History  No data available     The following portions of the patient's history were reviewed and updated as appropriate: allergies, current medications, past family history, past medical history, past social history, past surgical history and problem list.  Review of Systems Pertinent items are noted in HPI.    Objective:    General appearance: alert and moderately obese Breasts: normal appearance, no masses or tenderness Abdomen: normal findings: soft, non-tender Pelvic: cervix normal in appearance, exam obscured by obesity, external genitalia normal, no adnexal masses or tenderness, no cervical motion tenderness, rectovaginal septum normal, uterus normal size, shape, and consistency and vagina normal without discharge    Assessment:    Healthy female exam.   Menorrhagia  BV  Hypertension  Perimenopause.  Vasomotor symptoms.   Plan:    Education reviewed: low fat, low cholesterol diet, safe sex/STD prevention, self breast exams and weight bearing exercise. Contraception: tubal ligation. Follow up in: 2 weeks. Ultrasound ordered.   Endometrial biopsy next visit. Referred to PMD for BP follow up.

## 2013-08-30 LAB — PAP IG W/ RFLX HPV ASCU

## 2013-08-31 ENCOUNTER — Encounter: Payer: Self-pay | Admitting: Obstetrics & Gynecology

## 2013-08-31 ENCOUNTER — Other Ambulatory Visit: Payer: Self-pay | Admitting: Obstetrics & Gynecology

## 2013-08-31 ENCOUNTER — Ambulatory Visit (INDEPENDENT_AMBULATORY_CARE_PROVIDER_SITE_OTHER): Payer: Medicaid Other

## 2013-08-31 DIAGNOSIS — N951 Menopausal and female climacteric states: Secondary | ICD-10-CM

## 2013-08-31 DIAGNOSIS — N949 Unspecified condition associated with female genital organs and menstrual cycle: Secondary | ICD-10-CM

## 2013-08-31 DIAGNOSIS — N920 Excessive and frequent menstruation with regular cycle: Secondary | ICD-10-CM

## 2013-09-07 ENCOUNTER — Ambulatory Visit: Payer: Medicaid Other | Admitting: Obstetrics & Gynecology

## 2013-09-12 ENCOUNTER — Ambulatory Visit: Payer: Medicaid Other | Admitting: Obstetrics

## 2013-09-16 ENCOUNTER — Ambulatory Visit: Payer: Medicaid Other | Admitting: Obstetrics

## 2013-09-19 ENCOUNTER — Encounter: Payer: Self-pay | Admitting: Obstetrics & Gynecology

## 2014-02-10 ENCOUNTER — Emergency Department (HOSPITAL_COMMUNITY)
Admission: EM | Admit: 2014-02-10 | Discharge: 2014-02-10 | Disposition: A | Discharge status: Home / Self Care | Payer: Medicaid Other | Attending: Emergency Medicine | Admitting: Emergency Medicine

## 2014-02-10 ENCOUNTER — Encounter (HOSPITAL_COMMUNITY): Payer: Self-pay | Admitting: Emergency Medicine

## 2014-02-10 DIAGNOSIS — F411 Generalized anxiety disorder: Secondary | ICD-10-CM | POA: Insufficient documentation

## 2014-02-10 DIAGNOSIS — M5410 Radiculopathy, site unspecified: Secondary | ICD-10-CM

## 2014-02-10 DIAGNOSIS — Z8619 Personal history of other infectious and parasitic diseases: Secondary | ICD-10-CM | POA: Insufficient documentation

## 2014-02-10 DIAGNOSIS — E119 Type 2 diabetes mellitus without complications: Secondary | ICD-10-CM | POA: Insufficient documentation

## 2014-02-10 DIAGNOSIS — Z79899 Other long term (current) drug therapy: Secondary | ICD-10-CM | POA: Insufficient documentation

## 2014-02-10 DIAGNOSIS — IMO0002 Reserved for concepts with insufficient information to code with codable children: Secondary | ICD-10-CM | POA: Insufficient documentation

## 2014-02-10 DIAGNOSIS — Z794 Long term (current) use of insulin: Secondary | ICD-10-CM | POA: Insufficient documentation

## 2014-02-10 DIAGNOSIS — M5380 Other specified dorsopathies, site unspecified: Secondary | ICD-10-CM | POA: Insufficient documentation

## 2014-02-10 DIAGNOSIS — I10 Essential (primary) hypertension: Secondary | ICD-10-CM | POA: Insufficient documentation

## 2014-02-10 DIAGNOSIS — M79601 Pain in right arm: Secondary | ICD-10-CM

## 2014-02-10 MED ORDER — CYCLOBENZAPRINE HCL 10 MG PO TABS: 10.0000 mg | ORAL_TABLET | Freq: Two times a day (BID) | ORAL | Status: DC | PRN

## 2014-02-10 MED ORDER — ACETAMINOPHEN 500 MG PO TABS: 500.0000 mg | ORAL_TABLET | Freq: Four times a day (QID) | ORAL | Status: DC | PRN

## 2014-02-10 MED ORDER — CYCLOBENZAPRINE HCL 10 MG PO TABS
10.0000 mg | ORAL_TABLET | Freq: Once | ORAL | Status: AC
  Administered 2014-02-10: 10 mg via ORAL
  Filled 2014-02-10: qty 1

## 2014-02-10 NOTE — Discharge Instructions (Signed)
Please follow up with your primary care physician in 1-2 days. If you do not have one please call the [facility] number listed above. Please take pain medication and/or muscle relaxants as prescribed and as needed for pain. Please do not drive on narcotic pain medication or on muscle relaxants. Please read all discharge instructions and return precautions.   Lumbosacral Radiculopathy Lumbosacral radiculopathy is a pinched nerve or nerves in the low back (lumbosacral area). When this happens you may have weakness in your legs and may not be able to stand on your toes. You may have pain going down into your legs. There may be difficulties with walking normally. There are many causes of this problem. Sometimes this may happen from an injury, or simply from arthritis or boney problems. It may also be caused by other illnesses such as diabetes. If there is no improvement after treatment, further studies may be done to find the exact cause. DIAGNOSIS  X-rays may be needed if the problems become long standing. Electromyograms may be done. This study is one in which the working of nerves and muscles is studied. HOME CARE INSTRUCTIONS   Applications of ice packs may be helpful. Ice can be used in a plastic bag with a towel around it to prevent frostbite to skin. This may be used every 2 hours for 20 to 30 minutes, or as needed, while awake, or as directed by your caregiver.  Only take over-the-counter or prescription medicines for pain, discomfort, or fever as directed by your caregiver.  If physical therapy was prescribed, follow your caregiver's directions. SEEK IMMEDIATE MEDICAL CARE IF:   You have pain not controlled with medications.  You seem to be getting worse rather than better.  You develop increasing weakness in your legs.  You develop loss of bowel or bladder control.  You have difficulty with walking or balance, or develop clumsiness in the use of your legs.  You  have a fever. MAKE SURE YOU:   Understand these instructions.  Will watch your condition.  Will get help right away if you are not doing well or get worse. Document Released: 10/20/2005 Document Revised: 01/12/2012 Document Reviewed: 06/09/2008 [facility] Patient Information 2014 [place].  Cervical Radiculopathy Cervical radiculopathy happens when a nerve in the neck is pinched or bruised by a slipped (herniated) disk or by arthritic changes in the bones of the cervical spine. This can occur due to an injury or as part of the normal aging process. Pressure on the cervical nerves can cause pain or numbness that runs from your neck all the way down into your arm and fingers. CAUSES  There are many possible causes, including:  Injury.  Muscle tightness in the neck from overuse.  Swollen, painful joints (arthritis).  Breakdown or degeneration in the bones and joints of the spine (spondylosis) due to aging.  Bone spurs that may develop near the cervical nerves. SYMPTOMS  Symptoms include pain, weakness, or numbness in the affected arm and hand. Pain can be severe or irritating. Symptoms may be worse when extending or turning the neck. DIAGNOSIS  Your caregiver will ask about your symptoms and do a physical exam. He or she may test your strength and reflexes. X-rays, CT scans, and MRI scans may be needed in cases of injury or if the symptoms do not go away after a period of time. Electromyography (EMG) or nerve conduction testing may be done to study how your nerves and muscles are working. TREATMENT  Your caregiver may recommend certain exercises to help relieve your symptoms. Cervical radiculopathy can, and often does, get better with time and treatment. If your problems continue, treatment options may include:  Wearing a soft collar for short periods of time.  Physical therapy to strengthen the neck muscles.  Medicines, such as nonsteroidal anti-inflammatory drugs (NSAIDs),  oral corticosteroids, or spinal injections.  Surgery. Different types of surgery may be done depending on the cause of your problems. HOME CARE INSTRUCTIONS   Put ice on the affected area.  Put ice in a plastic bag.  Place a towel between your skin and the bag.  Leave the ice on for 15-20 minutes, 03-04 times a day or as directed by your caregiver.  If ice does not help, you can try using heat. Take a warm shower or bath, or use a hot water bottle as directed by your caregiver.  You may try a gentle neck and shoulder massage.  Use a flat pillow when you sleep.  Only take over-the-counter or prescription medicines for pain, discomfort, or fever as directed by your caregiver.  If physical therapy was prescribed, follow your caregiver's directions.  If a soft collar was prescribed, use it as directed. SEEK IMMEDIATE MEDICAL CARE IF:   Your pain gets much worse and cannot be controlled with medicines.  You have weakness or numbness in your hand, arm, face, or leg.  You have a high fever or a stiff, rigid neck.  You lose bowel or bladder control (incontinence).  You have trouble with walking, balance, or speaking. MAKE SURE YOU:   Understand these instructions.  Will watch your condition.  Will get help right away if you are not doing well or get worse. Document Released: 07/15/2001 Document Revised: 01/12/2012 Document Reviewed: 06/03/2011 [facility] Patient Information 2014 [place], [place].

## 2014-02-10 NOTE — ED Notes (Signed)
Pt at desk stating she has been it waiting room and never heard her name called, apologized and explained that we had called her several times, will call back next for triage
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
## 2014-02-10 NOTE — ED Notes (Signed)
Pt reports arm and leg pain and burning since last night. Denies injury. "I think it might be my neuropathy". Pt is ambulatory. Is a x 4.

## 2014-02-10 NOTE — ED Notes (Signed)
Called for triage x4 

## 2014-02-13 NOTE — ED Provider Notes (Signed)
Medical screening examination/treatment/procedure(s) were performed by non-physician practitioner and as supervising physician I was immediately available for consultation/collaboration.   EKG Interpretation None        Gwyneth SproutWhitney Larri Yehle, MD 02/13/14 [phone]

## 2014-03-23 ENCOUNTER — Emergency Department (HOSPITAL_COMMUNITY): Payer: Medicaid Other

## 2014-03-23 ENCOUNTER — Encounter (HOSPITAL_COMMUNITY): Payer: Self-pay | Admitting: Emergency Medicine

## 2014-03-23 ENCOUNTER — Emergency Department (HOSPITAL_COMMUNITY)
Admission: EM | Admit: 2014-03-23 | Discharge: 2014-03-23 | Disposition: A | Discharge status: Home / Self Care | Payer: Medicaid Other | Attending: Emergency Medicine | Admitting: Emergency Medicine

## 2014-03-23 DIAGNOSIS — K219 Gastro-esophageal reflux disease without esophagitis: Secondary | ICD-10-CM | POA: Insufficient documentation

## 2014-03-23 DIAGNOSIS — E119 Type 2 diabetes mellitus without complications: Secondary | ICD-10-CM | POA: Insufficient documentation

## 2014-03-23 DIAGNOSIS — I10 Essential (primary) hypertension: Secondary | ICD-10-CM | POA: Insufficient documentation

## 2014-03-23 DIAGNOSIS — Z8619 Personal history of other infectious and parasitic diseases: Secondary | ICD-10-CM | POA: Insufficient documentation

## 2014-03-23 DIAGNOSIS — Z8659 Personal history of other mental and behavioral disorders: Secondary | ICD-10-CM | POA: Insufficient documentation

## 2014-03-23 DIAGNOSIS — R0602 Shortness of breath: Secondary | ICD-10-CM | POA: Insufficient documentation

## 2014-03-23 DIAGNOSIS — E669 Obesity, unspecified: Secondary | ICD-10-CM | POA: Insufficient documentation

## 2014-03-23 DIAGNOSIS — R079 Chest pain, unspecified: Secondary | ICD-10-CM | POA: Insufficient documentation

## 2014-03-23 LAB — COMPREHENSIVE METABOLIC PANEL
ALK PHOS: 63 U/L (ref 39–117)
ALT: 16 U/L (ref 0–35)
AST: 19 U/L (ref 0–37)
Albumin: 3.4 g/dL — ABNORMAL LOW (ref 3.5–5.2)
BUN: 14 mg/dL (ref 6–23)
CO2: 24 mEq/L (ref 19–32)
CREATININE: 0.68 mg/dL (ref 0.50–1.10)
Calcium: 9 mg/dL (ref 8.4–10.5)
Chloride: 102 mEq/L (ref 96–112)
GFR calc non Af Amer: >90 mL/min (ref >90)
GLUCOSE: 304 mg/dL — AB (ref 70–99)
POTASSIUM: 3.9 meq/L (ref 3.7–5.3)
Sodium: 140 mEq/L (ref 137–147)
TOTAL PROTEIN: 6.5 g/dL (ref 6.0–8.3)
Total Bilirubin: <0.2 mg/dL — ABNORMAL LOW (ref 0.3–1.2)

## 2014-03-23 LAB — CBC
HEMATOCRIT: 33.2 % — AB (ref 36.0–46.0)
HEMOGLOBIN: 10.3 g/dL — AB (ref 12.0–15.0)
MCH: 26.2 pg (ref 26.0–34.0)
MCHC: 31 g/dL (ref 30.0–36.0)
MCV: 84.5 fL (ref 78.0–100.0)
Platelets: 223 10*3/uL (ref 150–400)
RBC: 3.93 MIL/uL (ref 3.87–5.11)
RDW: 16.8 % — ABNORMAL HIGH (ref 11.5–15.5)
WBC: 5.1 10*3/uL (ref 4.0–10.5)

## 2014-03-23 LAB — I-STAT TROPONIN, ED
Troponin i, poc: 0.01 ng/mL (ref 0.00–0.08)
Troponin i, poc: 0.02 ng/mL (ref 0.00–0.08)

## 2014-03-23 MED ORDER — PANTOPRAZOLE SODIUM 40 MG PO TBEC
40.0000 mg | DELAYED_RELEASE_TABLET | Freq: Every day | ORAL | Status: DC
  Administered 2014-03-23: 40 mg via ORAL
  Filled 2014-03-23: qty 1

## 2014-03-23 MED ORDER — FAMOTIDINE IN NACL 20-0.9 MG/50ML-% IV SOLN
20.0000 mg | Freq: Once | INTRAVENOUS | Status: AC
  Administered 2014-03-23: 20 mg via INTRAVENOUS
  Filled 2014-03-23: qty 50

## 2014-03-23 MED ORDER — GI COCKTAIL ~~LOC~~
30.0000 mL | Freq: Once | ORAL | Status: AC
  Administered 2014-03-23: 30 mL via ORAL
  Filled 2014-03-23: qty 30

## 2014-03-23 MED ORDER — OMEPRAZOLE 20 MG PO CPDR: 20.0000 mg | DELAYED_RELEASE_CAPSULE | Freq: Every day | ORAL | Status: DC

## 2014-03-23 NOTE — Discharge Instructions (Signed)
Chest Pain (Nonspecific) °It is often hard to give a specific diagnosis for the cause of chest pain. There is always a chance that your pain could be related to something serious, such as a heart attack or a blood clot in the lungs. You need to follow up with your caregiver for further evaluation. °CAUSES  °· Heartburn. °· Pneumonia or bronchitis. °· Anxiety or stress. °· Inflammation around your heart (pericarditis) or lung (pleuritis or pleurisy). °· A blood clot in the lung. °· A collapsed lung (pneumothorax). It can develop suddenly on its own (spontaneous pneumothorax) or from injury (trauma) to the chest. °· Shingles infection (herpes zoster virus). °The chest wall is composed of bones, muscles, and cartilage. Any of these can be the source of the pain. °· The bones can be bruised by injury. °· The muscles or cartilage can be strained by coughing or overwork. °· The cartilage can be affected by inflammation and become sore (costochondritis). °DIAGNOSIS  °Lab tests or other studies, such as X-rays, electrocardiography, stress testing, or cardiac imaging, may be needed to find the cause of your pain.  °TREATMENT  °· Treatment depends on what may be causing your chest pain. Treatment may include: °· Acid blockers for heartburn. °· Anti-inflammatory medicine. °· Pain medicine for inflammatory conditions. °· Antibiotics if an infection is present. °· You may be advised to change lifestyle habits. This includes stopping smoking and avoiding alcohol, caffeine, and chocolate. °· You may be advised to keep your head raised (elevated) when sleeping. This reduces the chance of acid going backward from your stomach into your esophagus. °· Most of the time, nonspecific chest pain will improve within 2 to 3 days with rest and mild pain medicine. °HOME CARE INSTRUCTIONS  °· If antibiotics were prescribed, take your antibiotics as directed. Finish them even if you start to feel better. °· For the next few days, avoid physical  activities that bring on chest pain. Continue physical activities as directed. °· Do not smoke. °· Avoid drinking alcohol. °· Only take over-the-counter or prescription medicine for pain, discomfort, or fever as directed by your caregiver. °· Follow your caregiver's suggestions for further testing if your chest pain does not go away. °· Keep any follow-up appointments you made. If you do not go to an appointment, you could develop lasting (chronic) problems with pain. If there is any problem keeping an appointment, you must call to reschedule. °SEEK MEDICAL CARE IF:  °· You think you are having problems from the medicine you are taking. Read your medicine instructions carefully. °· Your chest pain does not go away, even after treatment. °· You develop a rash with blisters on your chest. °SEEK IMMEDIATE MEDICAL CARE IF:  °· You have increased chest pain or pain that spreads to your arm, neck, jaw, back, or abdomen. °· You develop shortness of breath, an increasing cough, or you are coughing up blood. °· You have severe back or abdominal pain, feel nauseous, or vomit. °· You develop severe weakness, fainting, or chills. °· You have a fever. °THIS IS AN EMERGENCY. Do not wait to see if the pain will go away. Get medical help at once. Call your local emergency services (911 in [place] Do not drive yourself to the hospital. °MAKE SURE YOU:  °· Understand these instructions. °· Will watch your condition. °· Will get help right away if you are not doing well or get worse. °Document Released: 07/30/2005 Document Revised: 01/12/2012 Document Reviewed: 05/25/2008 °ExitCare® Patient Information ©2014 [employer],   LLC. ° °

## 2014-03-23 NOTE — ED Notes (Signed)
Per EMS: pt called out for CP. Pt was htn on arrival and was given 2 SL nitro in route. Pt remained hypertensive. Pt allergic to aspirin and was not given aspirin in route. Pt complained of pain that increased at night when she laid down. Pt has had a "mini" heart attack before in 2007.
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
## 2014-03-23 NOTE — ED Provider Notes (Signed)
Patient signed out to me to followup on second troponin. Patient was having atypical chest discomfort upon arrival to the ER. She has a burning sensation in her central chest when she lies down at night. This was felt to be more likely GI in origin than cardiac in origin. Initial EKG and troponin were unrevealing. Patient was held second troponin which has now returned and is negative. Upon recheck, patient is symptom-free. Patient counseled that she needs to followup with a primary care doctor and possibly even a GI doctor. Prescribed medication for reflux, return to the ER if her symptoms worsen.  Gilda Creasehristopher J. Hanako Tipping, MD 03/23/14 [phone]

## 2014-06-16 ENCOUNTER — Emergency Department (HOSPITAL_COMMUNITY): Payer: Medicaid Other

## 2014-06-16 ENCOUNTER — Emergency Department (HOSPITAL_COMMUNITY)
Admission: EM | Admit: 2014-06-16 | Discharge: 2014-06-16 | Disposition: A | Discharge status: Home / Self Care | Payer: Medicaid Other | Attending: Emergency Medicine | Admitting: Emergency Medicine

## 2014-06-16 ENCOUNTER — Encounter (HOSPITAL_COMMUNITY): Payer: Self-pay | Admitting: Emergency Medicine

## 2014-06-16 DIAGNOSIS — D649 Anemia, unspecified: Secondary | ICD-10-CM

## 2014-06-16 DIAGNOSIS — R5381 Other malaise: Secondary | ICD-10-CM | POA: Insufficient documentation

## 2014-06-16 DIAGNOSIS — R1013 Epigastric pain: Secondary | ICD-10-CM | POA: Insufficient documentation

## 2014-06-16 DIAGNOSIS — B9789 Other viral agents as the cause of diseases classified elsewhere: Secondary | ICD-10-CM | POA: Insufficient documentation

## 2014-06-16 DIAGNOSIS — R5383 Other fatigue: Secondary | ICD-10-CM

## 2014-06-16 DIAGNOSIS — Z8619 Personal history of other infectious and parasitic diseases: Secondary | ICD-10-CM | POA: Insufficient documentation

## 2014-06-16 DIAGNOSIS — Z8659 Personal history of other mental and behavioral disorders: Secondary | ICD-10-CM | POA: Insufficient documentation

## 2014-06-16 DIAGNOSIS — E119 Type 2 diabetes mellitus without complications: Secondary | ICD-10-CM | POA: Insufficient documentation

## 2014-06-16 DIAGNOSIS — J3489 Other specified disorders of nose and nasal sinuses: Secondary | ICD-10-CM | POA: Insufficient documentation

## 2014-06-16 DIAGNOSIS — B349 Viral infection, unspecified: Secondary | ICD-10-CM

## 2014-06-16 DIAGNOSIS — I10 Essential (primary) hypertension: Secondary | ICD-10-CM | POA: Insufficient documentation

## 2014-06-16 LAB — CBC WITH DIFFERENTIAL/PLATELET
BASOS PCT: 1 % (ref 0–1)
Basophils Absolute: 0.1 10*3/uL (ref 0.0–0.1)
EOS ABS: 0.3 10*3/uL (ref 0.0–0.7)
EOS PCT: 3 % (ref 0–5)
HEMATOCRIT: 30.6 % — AB (ref 36.0–46.0)
Hemoglobin: 9.3 g/dL — ABNORMAL LOW (ref 12.0–15.0)
LYMPHS PCT: 14 % (ref 12–46)
Lymphs Abs: 1.3 10*3/uL (ref 0.7–4.0)
MCH: 23.7 pg — ABNORMAL LOW (ref 26.0–34.0)
MCHC: 30.4 g/dL (ref 30.0–36.0)
MCV: 78.1 fL (ref 78.0–100.0)
MONO ABS: 0.8 10*3/uL (ref 0.1–1.0)
Monocytes Relative: 8 % (ref 3–12)
NEUTROS ABS: 7.1 10*3/uL (ref 1.7–7.7)
Neutrophils Relative %: 74 % (ref 43–77)
PLATELETS: 249 10*3/uL (ref 150–400)
RBC: 3.92 MIL/uL (ref 3.87–5.11)
RDW: 16.6 % — AB (ref 11.5–15.5)
WBC: 9.6 10*3/uL (ref 4.0–10.5)

## 2014-06-16 LAB — COMPREHENSIVE METABOLIC PANEL
ALBUMIN: 3.5 g/dL (ref 3.5–5.2)
ALT: 16 U/L (ref 0–35)
ANION GAP: 12 (ref 5–15)
AST: 19 U/L (ref 0–37)
Alkaline Phosphatase: 54 U/L (ref 39–117)
BUN: 9 mg/dL (ref 6–23)
CALCIUM: 8.9 mg/dL (ref 8.4–10.5)
CO2: 24 mEq/L (ref 19–32)
CREATININE: 0.71 mg/dL (ref 0.50–1.10)
Chloride: 100 mEq/L (ref 96–112)
GFR calc Af Amer: >90 mL/min (ref >90)
GFR calc non Af Amer: >90 mL/min (ref >90)
Glucose, Bld: 162 mg/dL — ABNORMAL HIGH (ref 70–99)
Potassium: 4 mEq/L (ref 3.7–5.3)
Sodium: 136 mEq/L — ABNORMAL LOW (ref 137–147)
TOTAL PROTEIN: 6.9 g/dL (ref 6.0–8.3)
Total Bilirubin: 0.2 mg/dL — ABNORMAL LOW (ref 0.3–1.2)

## 2014-06-16 LAB — LIPASE, BLOOD: LIPASE: 47 U/L (ref 11–59)

## 2014-06-16 MED ORDER — ONDANSETRON HCL 4 MG/2ML IJ SOLN
4.0000 mg | Freq: Once | INTRAMUSCULAR | Status: AC
  Administered 2014-06-16: 4 mg via INTRAVENOUS
  Filled 2014-06-16: qty 2

## 2014-06-16 MED ORDER — SODIUM CHLORIDE 0.9 % IV SOLN
1000.0000 mL | Freq: Once | INTRAVENOUS | Status: AC
  Administered 2014-06-16: 1000 mL via INTRAVENOUS

## 2014-06-16 MED ORDER — GI COCKTAIL ~~LOC~~
30.0000 mL | Freq: Once | ORAL | Status: AC
  Administered 2014-06-16: 30 mL via ORAL
  Filled 2014-06-16: qty 30

## 2014-06-16 MED ORDER — NAPROXEN 250 MG PO TABS
500.0000 mg | ORAL_TABLET | Freq: Once | ORAL | Status: AC
  Administered 2014-06-16: 500 mg via ORAL
  Filled 2014-06-16: qty 2
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
## 2014-06-16 NOTE — ED Notes (Signed)
Pt reports cough that started 2 days ago then this morning woke up with body aches "all over". Denies productive cough/fever/chills. Pt sts she feels very congested, has tried vicks vapor rub at home and got a [place] relief. sts she vomited once this morning, sts very slight nausea right now. Nad, skin warm and dry, resp e/u.

## 2014-06-16 NOTE — ED Notes (Signed)
Pt reports having cough, congestion, abd pain and emesis; states it feels like stomach is on fire

## 2014-06-16 NOTE — Discharge Instructions (Signed)
Take 4 over the counter ibuprofen tablets 3 times a day or 2 over-the-counter naproxen tablets twice a day for pain.  Viral Infections A virus is a type of germ. Viruses can cause:  Minor sore throats.  Aches and pains.  Headaches.  Runny nose.  Rashes.  Watery eyes.  Tiredness.  Coughs.  Loss of appetite.  Feeling sick to your stomach (nausea).  Throwing up (vomiting).  Watery poop (diarrhea). HOME CARE   Only take medicines as told by your doctor.  Drink enough water and fluids to keep your pee (urine) clear or pale yellow. Sports drinks are a good choice.  Get plenty of rest and eat healthy. Soups and broths with crackers or rice are fine. GET HELP RIGHT AWAY IF:   You have a very bad headache.  You have shortness of breath.  You have chest pain or neck pain.  You have an unusual rash.  You cannot stop throwing up.  You have watery poop that does not stop.  You cannot keep fluids down.  You or your child has a temperature by mouth above 102 F (38.9 C), not controlled by medicine.  Your baby is older than 3 months with a rectal temperature of 102 F (38.9 C) or higher.  Your baby is 823 months old or younger with a rectal temperature of 100.4 F (38 C) or higher. MAKE SURE YOU:   Understand these instructions.  Will watch this condition.  Will get help right away if you are not doing well or get worse. Document Released: 10/02/2008 Document Revised: 01/12/2012 Document Reviewed: 02/25/2011 [facility] Patient Information 2015 [place]. This information is not intended to replace advice given to you by your health care provider. Make sure you discuss any questions you have with your health care provider.  Anemia, Nonspecific Anemia is a condition in which the concentration of red blood cells or hemoglobin in the blood is below normal. Hemoglobin is a substance in red blood cells that carries oxygen to the tissues of the body. Anemia results in  not enough oxygen reaching these tissues.  CAUSES  Common causes of anemia include:   Excessive bleeding. Bleeding may be internal or external. This includes excessive bleeding from periods (in women) or from the intestine.   Poor nutrition.   Chronic kidney, thyroid, and liver disease.  Bone marrow disorders that decrease red blood cell production.  Cancer and treatments for cancer.  HIV, AIDS, and their treatments.  Spleen problems that increase red blood cell destruction.  Blood disorders.  Excess destruction of red blood cells due to infection, medicines, and autoimmune disorders. SIGNS AND SYMPTOMS   Minor weakness.   Dizziness.   Headache.  Palpitations.   Shortness of breath, especially with exercise.   Paleness.  Cold sensitivity.  Indigestion.  Nausea.  Difficulty sleeping.  Difficulty concentrating. Symptoms may occur suddenly or they may develop slowly.  DIAGNOSIS  Additional blood tests are often needed. These help your health care provider determine the best treatment. Your health care provider will check your stool for blood and look for other causes of blood loss.  TREATMENT  Treatment varies depending on the cause of the anemia. Treatment can include:   Supplements of iron, vitamin B12, or folic acid.   Hormone medicines.   A blood transfusion. This may be needed if blood loss is severe.   Hospitalization. This may be needed if there is significant continual blood loss.   Dietary changes.  Spleen removal. HOME CARE  INSTRUCTIONS Keep all follow-up appointments. It often takes many weeks to correct anemia, and having your health care provider check on your condition and your response to treatment is very important. SEEK IMMEDIATE MEDICAL CARE IF:   You develop extreme weakness, shortness of breath, or chest pain.   You become dizzy or have trouble concentrating.  You develop heavy vaginal bleeding.   You develop a rash.    You have bloody or black, tarry stools.   You faint.   You vomit up blood.   You vomit repeatedly.   You have abdominal pain.  You have a fever or persistent symptoms for more than 2-3 days.   You have a fever and your symptoms suddenly get worse.   You are dehydrated.  MAKE SURE YOU:  Understand these instructions.  Will watch your condition.  Will get help right away if you are not doing well or get worse. Document Released: 11/27/2004 Document Revised: 06/22/2013 Document Reviewed: 04/15/2013 [facility] Patient Information 2015 [place], [place]. This information is not intended to replace advice given to you by your health care provider. Make sure you discuss any questions you have with your health care provider.

## 2014-06-19 NOTE — ED Provider Notes (Signed)
I saw and evaluated the patient, reviewed the resident's note and I agree with the findings and plan.   EKG Interpretation   Date/Time:  Friday June 16 2014 09:45:10 EDT Ventricular Rate:  81 PR Interval:  171 QRS Duration: 94 QT Interval:  373 QTC Calculation: 433 R Axis:   -27 Text Interpretation:  Sinus rhythm Borderline left axis deviation Probable  anteroseptal infarct, old No significant change since last tracing  Confirmed by Cortlandt Capuano  MD, Lariyah Shetterly ([id number]) on 06/16/2014 10:34:50 AM       Sx c/w viral URI. Benign abdominal exam. W/U unremarkable, will d/c home with return precautions.  Audree CamelScott T Nyla Creason, MD 06/19/14 1346

## 2014-09-04 ENCOUNTER — Encounter (HOSPITAL_COMMUNITY): Payer: Self-pay | Admitting: Emergency Medicine

## 2014-10-31 ENCOUNTER — Emergency Department (HOSPITAL_COMMUNITY)
Admission: EM | Admit: 2014-10-31 | Discharge: 2014-10-31 | Disposition: A | Discharge status: Home / Self Care | Payer: PRIVATE HEALTH INSURANCE | Attending: Emergency Medicine | Admitting: Emergency Medicine

## 2014-10-31 ENCOUNTER — Encounter (HOSPITAL_COMMUNITY): Payer: Self-pay | Admitting: *Deleted

## 2014-10-31 DIAGNOSIS — S30861A Insect bite (nonvenomous) of abdominal wall, initial encounter: Secondary | ICD-10-CM | POA: Insufficient documentation

## 2014-10-31 DIAGNOSIS — E119 Type 2 diabetes mellitus without complications: Secondary | ICD-10-CM | POA: Insufficient documentation

## 2014-10-31 DIAGNOSIS — I10 Essential (primary) hypertension: Secondary | ICD-10-CM | POA: Diagnosis not present

## 2014-10-31 DIAGNOSIS — Y9389 Activity, other specified: Secondary | ICD-10-CM | POA: Insufficient documentation

## 2014-10-31 DIAGNOSIS — Z79899 Other long term (current) drug therapy: Secondary | ICD-10-CM | POA: Diagnosis not present

## 2014-10-31 DIAGNOSIS — R21 Rash and other nonspecific skin eruption: Secondary | ICD-10-CM | POA: Diagnosis present

## 2014-10-31 DIAGNOSIS — Z8659 Personal history of other mental and behavioral disorders: Secondary | ICD-10-CM | POA: Insufficient documentation

## 2014-10-31 DIAGNOSIS — W57XXXA Bitten or stung by nonvenomous insect and other nonvenomous arthropods, initial encounter: Secondary | ICD-10-CM | POA: Diagnosis not present

## 2014-10-31 DIAGNOSIS — Y9289 Other specified places as the place of occurrence of the external cause: Secondary | ICD-10-CM | POA: Insufficient documentation

## 2014-10-31 DIAGNOSIS — Z862 Personal history of diseases of the blood and blood-forming organs and certain disorders involving the immune mechanism: Secondary | ICD-10-CM | POA: Diagnosis not present

## 2014-10-31 DIAGNOSIS — S50861A Insect bite (nonvenomous) of right forearm, initial encounter: Secondary | ICD-10-CM | POA: Diagnosis not present

## 2014-10-31 DIAGNOSIS — M791 Myalgia: Secondary | ICD-10-CM | POA: Diagnosis not present

## 2014-10-31 DIAGNOSIS — Y998 Other external cause status: Secondary | ICD-10-CM | POA: Diagnosis not present

## 2014-10-31 MED ORDER — DIPHENHYDRAMINE HCL 25 MG PO TABS: 25.0000 mg | ORAL_TABLET | Freq: Four times a day (QID) | ORAL | Status: DC | PRN

## 2014-10-31 MED ORDER — HYDROCORTISONE 1 % EX CREA: TOPICAL_CREAM | CUTANEOUS | Status: DC

## 2014-10-31 NOTE — Discharge Instructions (Signed)
Read the information below.  Use the prescribed medication as directed.  Please discuss all new medications with your pharmacist.  You may return to the Emergency Department at any time for worsening condition or any new symptoms that concern you.  If you develop increased redness, swelling, pus draining from the wound, or fevers greater than 100.4, return to the ER immediately for a recheck.   ° ° °Insect Bite °Mosquitoes, flies, fleas, bedbugs, and other insects can bite. Insect bites are different from insect stings. The bite may be red, puffy (swollen), and itchy for 2 to 4 days. Most bites get better on their own. °HOME CARE  °· Do not scratch the bite. °· Keep the bite clean and dry. Wash the bite with soap and water. °· Put ice on the bite. °¨ Put ice in a plastic bag. °¨ Place a towel between your skin and the bag. °¨ Leave the ice on for 20 minutes, 4 times a day. Do this for the first 2 to 3 days, or as told by your doctor. °· You may use medicated lotions or creams to lessen itching as told by your doctor. °· Only take medicines as told by your doctor. °· If you are given medicines (antibiotics), take them as told. Finish them even if you start to feel better. °You may need a tetanus shot if: °· You cannot remember when you had your last tetanus shot. °· You have never had a tetanus shot. °· The injury broke your skin. °If you need a tetanus shot and you choose not to have one, you may get tetanus. Sickness from tetanus can be serious. °GET HELP RIGHT AWAY IF:  °· You have more pain, redness, or puffiness. °· You see a red line on the skin coming from the bite. °· You have a fever. °· You have joint pain. °· You have a headache or neck pain. °· You feel weak. °· You have a rash. °· You have chest pain, or you are short of breath. °· You have belly (abdominal) pain. °· You feel sick to your stomach (nauseous) or throw up (vomit). °· You feel very tired or sleepy. °MAKE SURE YOU:  °· Understand these  instructions. °· Will watch your condition. °· Will get help right away if you are not doing well or get worse. °Document Released: 10/17/2000 Document Revised: 01/12/2012 Document Reviewed: 05/21/2011 °ExitCare® Patient Information ©2015 [employer]. This information is not intended to replace advice given to you by your health care provider. Make sure you discuss any questions you have with your health care provider. ° ° ° °Emergency Department Resource Guide °1) Find a Doctor and Pay Out of Pocket °Although you won't have to find out who is covered by your insurance plan, it is a good idea to ask around and get recommendations. You will then need to call the office and see if the doctor you have chosen will accept you as a new patient and what types of options they offer for patients who are self-pay. Some doctors offer discounts or will set up payment plans for their patients who do not have insurance, but you will need to ask so you aren't surprised when you get to your appointment. ° °2) Contact Your Local Health Department °Not all health departments have doctors that can see patients for sick visits, but many do, so it is worth a call to see if yours does. If you don't know where your local health department is, you   can check in your phone book. The CDC also has a tool to help you locate your state's health department, and many state websites also have listings of all of their local health departments. ° °3) Find a Walk-in Clinic °If your illness is not likely to be very severe or complicated, you may want to try a walk in clinic. These are popping up all over the country in pharmacies, drugstores, and shopping centers. They're usually staffed by nurse practitioners or physician assistants that have been trained to treat common illnesses and complaints. They're usually fairly quick and inexpensive. However, if you have serious medical issues or chronic medical problems, these are probably not your best  option. ° °No Primary Care Doctor: °- Call [facility] at  832-8000 - they can help you locate a primary care doctor that  accepts your insurance, provides certain services, etc. °- Physician Referral Service- 1-800-533-3463 ° °Chronic Pain Problems: °Organization         Address  Phone   Notes  °[facility]  (336) 297-2271 Patients need to be referred by their primary care doctor.  ° °Medication Assistance: °Organization         Address  Phone   Notes  °[place] [facility] [address] °[place], [place] [address] (336) 641-8030 --Must be a resident of [place] °-- Must have NO insurance coverage whatsoever (no Medicaid/ Medicare, etc.) °-- The pt. MUST have a primary care doctor that directs their care regularly and follows them in the community °  °[facility]  (866) 331-1348   °[employer]  (888) 892-1162   ° °Agencies that provide inexpensive medical care: °Organization         Address  Phone   Notes  °[facility]  (336) 832-8035   °[facility]    (336) 832-7272   °[facility] [address] °[place], [place] [address] (336) 832-4777   °[facility] [address], °[place] (336) 271-4999   °[facility]    (336) 373-0678   °[facility]    (336) 272-1050   °[facility] ° [address], [place] Phone:  (336) 832-4444, Fax:  (336) 832-4440 Hours of Operation:  9 am - 6 pm, M-F.  Also accepts Medicaid/Medicare and self-pay.  °[facility] ° [address], [place] Phone: (336) 832-3150, Fax: (336) 832-3151. Hours of Operation:  8:30 am - 5:30 pm, M-F.  Also accepts Medicaid and self-pay.  °[facility] [place] [address], [place] Phone: (336) 878-6027   °[facility] [address], [place], [place] (336)723-1848, Ext. 123 Mondays & Thursdays: 7-9 AM.  First 15  patients are seen on a first come, first serve basis. °  ° °Medicaid-accepting [place] Providers: ° °Organization         Address  Phone   Notes  °[facility] [address], [place] (336) 641-2100 Also accepts self-pay patients.  °[facility] [address], [place] ° (336) 856-9996   °[facility] [address], [place] (336) 288-8857   °[facility] [address] [place] [address], [place] (336) 299-7000   °Veita Bland [address], [place]  ° (336) 373-1557 Only accepts [place] Access Medicaid patients after they have their name applied   to their card.  ° °Self-Pay (no insurance) in [place]: ° °Organization         Address  Phone   Notes  °Sickle Cell Patients, [facility] [address], [place] (336) 832-1970   °[facility] [address], [place] (336) 832-4400   °[facility] ° [address], [place] (336) 992-4800   °[facility]/Dr. Osei-Bonsu ° [address] [place] [address], [place] or [address], [place] (336) 841-8500 Phone number for both [place] and [place] locations is the same.  °[facility] [address], [place] (336) 299-0000   °[facility] [address] [place] [address], [place] or [address] (336) 852-7530 °(336) 878-2260   °[facility] [address], [place] (336) 350-1642, phone; (336) 294-5005, fax Sees patients 1st and 3rd Saturday of every month.  Must not qualify for public or private insurance (i.e. Medicaid, Medicare, [place] Health Choice, Veterans' Benefits) • Household income should be no more than 200% of the poverty level •The clinic cannot treat you if you are pregnant or think you are pregnant • Sexually transmitted diseases are not treated at the clinic.  ° ° °Dental  Care: °Organization         Address  Phone  Notes  °[facility] [address], [place] (336) 641-6152 Accepts children up to age 21 who are enrolled in Medicaid or [place] Health Choice; pregnant women with a Medicaid card; and children who have applied for Medicaid or [place] Health Choice, but were declined, whose parents can pay a reduced fee at time of service.  °[facility] [place]  [address], [place] (336) 641-7733 Accepts children up to age 21 who are enrolled in Medicaid or [place] Health Choice; pregnant women with a Medicaid card; and children who have applied for Medicaid or [place] Health Choice, but were declined, whose parents can pay a reduced fee at time of service.  °[facility] ° [address], [place] (336) 641-4533 Patients are seen by appointment only. Walk-ins are not accepted. [facility] will see patients 18 years of age and older. °Monday - Tuesday (8am-5pm) °Most Wednesdays (8:30-5pm) °$30 per visit, cash only  °[facility] ° [address], [place] (336) 641-4533 Patients are seen by appointment only. Walk-ins are not accepted. [facility] will see patients 18 years of age and older. °One Wednesday Evening (Monthly: Volunteer Based).  $30 per visit, cash only  °[facility]  (919) 537-3737 for adults; Children under age 4, call Graduate Pediatric Dentistry at (919) [phone]. Children aged 4-14, please call (919) [phone] to request a pediatric application. ° Dental services are provided in all areas of dental care including fillings, crowns and bridges, complete and partial dentures, implants, gum treatment, root canals, and extractions. Preventive care is also provided. Treatment is provided to both adults and children. °Patients are selected via a lottery and there is often a waiting list. °  °[facility] [address], °[place] ° (336) 763-8833 [email or web address].[email or web address] °  °[facility] [address], [place], [place] (336)723-1848, Ext. 123 Second and Fourth Thursday of each month, opens at 6:30 AM; Clinic ends at 9 AM.  Patients are seen on a first-come first-served   basis, and a limited number are seen during each clinic.  ° °[facility] ° [address], [place], [place] (336) 723-7904   Eligibility Requirements °You must have lived in [place], [place], or [place] counties for at least the last three months. °  You cannot be eligible for state or federal sponsored healthcare insurance, including Veterans Administration, Medicaid, or Medicare. °  You generally cannot be eligible for healthcare insurance through your employer.  °  How to apply: °Eligibility screenings are held every Tuesday and Wednesday afternoon from 1:00 pm until 4:00 pm. You do not need an appointment for the interview!  °[facility] [address], [place], [place] 336-631-2330   °[facility]  336-342-8273   °[facility]  336-703-3100   °[facility]  336-570-6415   ° °Behavioral Health Resources in the Community: °Intensive Outpatient Programs °Organization         Address  Phone  Notes  °[facility] [address], [place], [place] 336-878-6098   °[facility] [address], [place], [place] 336-832-9800   °[facility] [address], [place], [place] ° 336-882-2125   °[facility] [address],  °[place], [place] 1-800-853-5163 or 336-641-4981   °Substance Abuse Resources °Organization         Address  Phone  Notes  °[facility]  336-882-2125   °[facility]  336-784-9470   °[facility]  336-285-9073   °[facility]  336-845-3988   °Residential & Outpatient Substance Abuse Program  1-800-659-3381    °Psychological Services °Organization         Address  Phone  Notes  °[facility]  336- 832-9600   °[facility]  336- 378-7881   °[facility] [address], [place] 1-800-853-5163 or 336-641-4981   ° °Mobile Crisis Teams °Organization         Address  Phone  Notes  °[facility], Mobile Crisis Care Unit  1-877-626-1772   °Assertive °[facility] ° [address] [place], [place] 336-834-9664   °Sharon DeEsch [address] °[place] 336-554-5454   ° °Self-Help/Support Groups °Organization         Address  Phone             Notes  °[facility] [place] - variety of support groups  336- 373-1402 Call for more information  °Narcotics Anonymous (NA), [facility] [address], °[place]  2 meetings at this location  ° °Residential Treatment Programs °Organization         Address  Phone  Notes  °[facility] [address],    °[place]  1-866-801-8205   °[facility] ° [address], [place], [place] 704-293-8524   °[facility] [address], [place] 336-845-3988 Admissions: 8am-3pm M-F  °[facility] [address]    °[place], [place] 336-841-1104   °[facility] [address], [place], [place] 336-379-7146   °[facility] [address]  °[place], [place] 336-285-9073   °[facility] Intensive Outpatient [address], [place], [place] 336-852-3033   °[facility] [address]  °[place], [place] 1-877-615-2722 or 336-784-9470   °[facility] [address] [place],  336-227-7417 Accepts Medicaid  °  [facility] [address]  °[place] 1-800-659-3381 Substance Abuse/Addiction Treatment  ° °[place] Behavioral Health Resources °Organization         Address  Phone  Notes  °[facility]  (888) 581-9988   °Julie Brannon, PhD [address] [place], [place]   (336) 349-5553 or (336) 951-0000   °[facility]   [address] °[place], [place] (336) 349-4454   °[facility] [address], [place], [place] (336) 342-8316 Insurance/Medicaid/sponsorship through [facility]  °[facility] [address]                                    [place], [place] (336) 342-8316 Therapy/tele-psych/case  °[facility] [address]  ° [place], [place] (336) 349-2233    °Dr. Arfeen  (336) 349-4544   °[facility] [place]  [employer] [place] [facility] 1) [address],  °2) [address], [place] °3)  [address], [place] (336) 349-3220 °(336) 342-7768 ° °(336) 342-8140   °[place] [facility] (336) 342-1394 or (336) 342-3537 (After Hours)    ° ° ° °
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
## 2014-10-31 NOTE — ED Notes (Signed)
Pt in c/o hives for the last three days, no distress noted

## 2015-01-01 ENCOUNTER — Encounter (HOSPITAL_COMMUNITY): Payer: Self-pay | Admitting: Emergency Medicine

## 2015-01-01 ENCOUNTER — Emergency Department (HOSPITAL_COMMUNITY)
Admission: EM | Admit: 2015-01-01 | Discharge: 2015-01-02 | Disposition: A | Discharge status: Home / Self Care | Payer: No Typology Code available for payment source | Attending: Emergency Medicine | Admitting: Emergency Medicine

## 2015-01-01 DIAGNOSIS — R0789 Other chest pain: Secondary | ICD-10-CM | POA: Insufficient documentation

## 2015-01-01 DIAGNOSIS — E860 Dehydration: Secondary | ICD-10-CM | POA: Diagnosis not present

## 2015-01-01 DIAGNOSIS — M791 Myalgia, unspecified site: Secondary | ICD-10-CM

## 2015-01-01 DIAGNOSIS — I10 Essential (primary) hypertension: Secondary | ICD-10-CM | POA: Diagnosis not present

## 2015-01-01 DIAGNOSIS — M549 Dorsalgia, unspecified: Secondary | ICD-10-CM | POA: Insufficient documentation

## 2015-01-01 DIAGNOSIS — E119 Type 2 diabetes mellitus without complications: Secondary | ICD-10-CM | POA: Diagnosis not present

## 2015-01-01 DIAGNOSIS — R630 Anorexia: Secondary | ICD-10-CM | POA: Insufficient documentation

## 2015-01-01 DIAGNOSIS — Z8659 Personal history of other mental and behavioral disorders: Secondary | ICD-10-CM | POA: Insufficient documentation

## 2015-01-01 DIAGNOSIS — Z8739 Personal history of other diseases of the musculoskeletal system and connective tissue: Secondary | ICD-10-CM | POA: Insufficient documentation

## 2015-01-01 DIAGNOSIS — R079 Chest pain, unspecified: Secondary | ICD-10-CM | POA: Diagnosis present

## 2015-01-01 LAB — CBG MONITORING, ED: GLUCOSE-CAPILLARY: 172 mg/dL — AB (ref 70–99)

## 2015-01-01 NOTE — ED Notes (Signed)
Pt reports pain to L arm and L leg and stts her whole body is swelling intermittently this week.

## 2015-01-02 ENCOUNTER — Emergency Department (HOSPITAL_COMMUNITY): Payer: No Typology Code available for payment source

## 2015-01-02 LAB — CBC WITH DIFFERENTIAL/PLATELET
Basophils Absolute: 0 10*3/uL (ref 0.0–0.1)
Basophils Relative: 1 % (ref 0–1)
EOS PCT: 2 % (ref 0–5)
Eosinophils Absolute: 0.1 10*3/uL (ref 0.0–0.7)
HEMATOCRIT: 32.1 % — AB (ref 36.0–46.0)
Hemoglobin: 10.5 g/dL — ABNORMAL LOW (ref 12.0–15.0)
LYMPHS ABS: 2.3 10*3/uL (ref 0.7–4.0)
Lymphocytes Relative: 39 % (ref 12–46)
MCH: 26.9 pg (ref 26.0–34.0)
MCHC: 32.7 g/dL (ref 30.0–36.0)
MCV: 82.3 fL (ref 78.0–100.0)
MONO ABS: 0.4 10*3/uL (ref 0.1–1.0)
MONOS PCT: 6 % (ref 3–12)
Neutro Abs: 3.1 10*3/uL (ref 1.7–7.7)
Neutrophils Relative %: 52 % (ref 43–77)
Platelets: 263 10*3/uL (ref 150–400)
RBC: 3.9 MIL/uL (ref 3.87–5.11)
RDW: 19.4 % — ABNORMAL HIGH (ref 11.5–15.5)
WBC: 6 10*3/uL (ref 4.0–10.5)

## 2015-01-02 LAB — URINALYSIS, ROUTINE W REFLEX MICROSCOPIC
BILIRUBIN URINE: NEGATIVE
Glucose, UA: NEGATIVE mg/dL
Ketones, ur: NEGATIVE mg/dL
Leukocytes, UA: NEGATIVE
Nitrite: NEGATIVE
PROTEIN: NEGATIVE mg/dL
Specific Gravity, Urine: 1.026 (ref 1.005–1.030)
Urobilinogen, UA: 0.2 mg/dL (ref 0.0–1.0)
pH: 5 (ref 5.0–8.0)

## 2015-01-02 LAB — COMPREHENSIVE METABOLIC PANEL
ALT: 17 U/L (ref 0–35)
AST: 23 U/L (ref 0–37)
Albumin: 3.4 g/dL — ABNORMAL LOW (ref 3.5–5.2)
Alkaline Phosphatase: 62 U/L (ref 39–117)
Anion gap: 11 (ref 5–15)
BUN: 11 mg/dL (ref 6–23)
CALCIUM: 8.8 mg/dL (ref 8.4–10.5)
CO2: 22 mmol/L (ref 19–32)
CREATININE: 0.61 mg/dL (ref 0.50–1.10)
Chloride: 106 mmol/L (ref 96–112)
GLUCOSE: 183 mg/dL — AB (ref 70–99)
Potassium: 4.2 mmol/L (ref 3.5–5.1)
Sodium: 139 mmol/L (ref 135–145)
Total Bilirubin: 0.5 mg/dL (ref 0.3–1.2)
Total Protein: 6.1 g/dL (ref 6.0–8.3)

## 2015-01-02 LAB — URINE MICROSCOPIC-ADD ON

## 2015-01-02 LAB — CK: Total CK: 101 U/L (ref 7–177)

## 2015-01-02 LAB — TROPONIN I

## 2015-01-02 MED ORDER — METHOCARBAMOL 500 MG PO TABS
1000.0000 mg | ORAL_TABLET | Freq: Once | ORAL | Status: AC
  Administered 2015-01-02: 1000 mg via ORAL
  Filled 2015-01-02: qty 2

## 2015-01-02 MED ORDER — SODIUM CHLORIDE 0.9 % IV BOLUS (SEPSIS)
1000.0000 mL | Freq: Once | INTRAVENOUS | Status: AC
  Administered 2015-01-02: 1000 mL via INTRAVENOUS

## 2015-01-02 MED ORDER — KETOROLAC TROMETHAMINE 30 MG/ML IJ SOLN
30.0000 mg | Freq: Once | INTRAMUSCULAR | Status: AC
  Administered 2015-01-02: 30 mg via INTRAVENOUS
  Filled 2015-01-02: qty 1

## 2015-01-02 MED ORDER — IBUPROFEN 600 MG PO TABS: 600.0000 mg | ORAL_TABLET | Freq: Four times a day (QID) | ORAL | Status: DC | PRN

## 2015-01-02 MED ORDER — METHOCARBAMOL 500 MG PO TABS: 1000.0000 mg | ORAL_TABLET | Freq: Three times a day (TID) | ORAL | Status: DC | PRN

## 2015-01-02 NOTE — Discharge Instructions (Signed)
Chest Wall Pain °Chest wall pain is pain in or around the bones and muscles of your chest. It may take up to 6 weeks to get better. It may take longer if you must stay physically active in your work and activities.  °CAUSES  °Chest wall pain may happen on its own. However, it may be caused by: °· A viral illness like the flu. °· Injury. °· Coughing. °· Exercise. °· Arthritis. °· Fibromyalgia. °· Shingles. °HOME CARE INSTRUCTIONS  °· Avoid overtiring physical activity. Try not to strain or perform activities that cause pain. This includes any activities using your chest or your abdominal and side muscles, especially if heavy weights are used. °· Put ice on the sore area. °¨ Put ice in a plastic bag. °¨ Place a towel between your skin and the bag. °¨ Leave the ice on for 15-20 minutes per hour while awake for the first 2 days. °· Only take over-the-counter or prescription medicines for pain, discomfort, or fever as directed by your caregiver. °SEEK IMMEDIATE MEDICAL CARE IF:  °· Your pain increases, or you are very uncomfortable. °· You have a fever. °· Your chest pain becomes worse. °· You have new, unexplained symptoms. °· You have nausea or vomiting. °· You feel sweaty or lightheaded. °· You have a cough with phlegm (sputum), or you cough up blood. °MAKE SURE YOU:  °· Understand these instructions. °· Will watch your condition. °· Will get help right away if you are not doing well or get worse. °Document Released: 10/20/2005 Document Revised: 01/12/2012 Document Reviewed: 06/16/2011 °ExitCare® Patient Information ©2015 [employer]. This information is not intended to replace advice given to you by your health care provider. Make sure you discuss any questions you have with your health care provider. °Muscle Pain °Muscle pain (myalgia) may be caused by many things, including: °· Overuse or muscle strain, especially if you are not in shape. This is the most common cause of muscle  pain. °· Injury. °· Bruises. °· Viruses, such as the flu. °· Infectious diseases. °· Fibromyalgia, which is a chronic condition that causes muscle tenderness, fatigue, and headache. °· Autoimmune diseases, including lupus. °· Certain drugs, including ACE inhibitors and statins. °Muscle pain may be mild or severe. In most cases, the pain lasts only a short time and goes away without treatment. To diagnose the cause of your muscle pain, your health care provider will take your medical history. This means he or she will ask you when your muscle pain began and what has been happening. If you have not had muscle pain for very long, your health care provider may want to wait before doing much testing. If your muscle pain has lasted a long time, your health care provider may want to run tests right away. If your health care provider thinks your muscle pain may be caused by illness, you may need to have additional tests to rule out certain conditions.  °Treatment for muscle pain depends on the cause. Home care is often enough to relieve muscle pain. Your health care provider may also prescribe anti-inflammatory medicine. °HOME CARE INSTRUCTIONS °Watch your condition for any changes. The following actions may help to lessen any discomfort you are feeling: °· Only take over-the-counter or prescription medicines as directed by your health care provider. °· Apply ice to the sore muscle: °¨ Put ice in a plastic bag. °¨ Place a towel between your skin and the bag. °¨ Leave the ice on for 15-20 minutes, 3-4 times a   day. °· You may alternate applying hot and cold packs to the muscle as directed by your health care provider. °· If overuse is causing your muscle pain, slow down your activities until the pain goes away. °¨ Remember that it is normal to feel some muscle pain after starting a workout program. Muscles that have not been used often will be sore at first. °¨ Do regular, gentle exercises if you are not usually  active. °¨ Warm up before exercising to lower your risk of muscle pain. °· Do not continue working out if the pain is very bad. Bad pain could mean you have injured a muscle. °SEEK MEDICAL CARE IF: °· Your muscle pain gets worse, and medicines do not help. °· You have muscle pain that lasts longer than 3 days. °· You have a rash or fever along with muscle pain. °· You have muscle pain after a tick bite. °· You have muscle pain while working out, even though you are in good physical condition. °· You have redness, soreness, or swelling along with muscle pain. °· You have muscle pain after starting a new medicine or changing the dose of a medicine. °SEEK IMMEDIATE MEDICAL CARE IF: °· You have trouble breathing. °· You have trouble swallowing. °· You have muscle pain along with a stiff neck, fever, and vomiting. °· You have severe muscle weakness or cannot move part of your body. °MAKE SURE YOU:  °· Understand these instructions. °· Will watch your condition. °· Will get help right away if you are not doing well or get worse. °Document Released: 09/11/2006 Document Revised: 10/25/2013 Document Reviewed: 08/16/2013 °ExitCare® Patient Information ©2015 [employer]. This information is not intended to replace advice given to you by your health care provider. Make sure you discuss any questions you have with your health care provider. ° °
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
## 2015-03-26 ENCOUNTER — Encounter (HOSPITAL_COMMUNITY): Payer: Self-pay | Admitting: Emergency Medicine

## 2015-03-26 ENCOUNTER — Emergency Department (HOSPITAL_COMMUNITY): Payer: No Typology Code available for payment source

## 2015-03-26 ENCOUNTER — Emergency Department (HOSPITAL_COMMUNITY)
Admission: EM | Admit: 2015-03-26 | Discharge: 2015-03-26 | Disposition: A | Discharge status: Home / Self Care | Payer: No Typology Code available for payment source | Attending: Emergency Medicine | Admitting: Emergency Medicine

## 2015-03-26 DIAGNOSIS — Z9851 Tubal ligation status: Secondary | ICD-10-CM | POA: Diagnosis not present

## 2015-03-26 DIAGNOSIS — R509 Fever, unspecified: Secondary | ICD-10-CM | POA: Insufficient documentation

## 2015-03-26 DIAGNOSIS — R1013 Epigastric pain: Secondary | ICD-10-CM | POA: Diagnosis present

## 2015-03-26 DIAGNOSIS — Z79899 Other long term (current) drug therapy: Secondary | ICD-10-CM | POA: Insufficient documentation

## 2015-03-26 DIAGNOSIS — E119 Type 2 diabetes mellitus without complications: Secondary | ICD-10-CM | POA: Insufficient documentation

## 2015-03-26 DIAGNOSIS — Z8659 Personal history of other mental and behavioral disorders: Secondary | ICD-10-CM | POA: Diagnosis not present

## 2015-03-26 DIAGNOSIS — Z3202 Encounter for pregnancy test, result negative: Secondary | ICD-10-CM | POA: Insufficient documentation

## 2015-03-26 DIAGNOSIS — R110 Nausea: Secondary | ICD-10-CM | POA: Insufficient documentation

## 2015-03-26 DIAGNOSIS — I10 Essential (primary) hypertension: Secondary | ICD-10-CM | POA: Insufficient documentation

## 2015-03-26 DIAGNOSIS — R531 Weakness: Secondary | ICD-10-CM | POA: Diagnosis not present

## 2015-03-26 DIAGNOSIS — Z862 Personal history of diseases of the blood and blood-forming organs and certain disorders involving the immune mechanism: Secondary | ICD-10-CM | POA: Insufficient documentation

## 2015-03-26 DIAGNOSIS — R000 Tachycardia, unspecified: Secondary | ICD-10-CM | POA: Insufficient documentation

## 2015-03-26 DIAGNOSIS — M545 Low back pain: Secondary | ICD-10-CM | POA: Diagnosis not present

## 2015-03-26 DIAGNOSIS — R350 Frequency of micturition: Secondary | ICD-10-CM | POA: Insufficient documentation

## 2015-03-26 DIAGNOSIS — R109 Unspecified abdominal pain: Secondary | ICD-10-CM

## 2015-03-26 DIAGNOSIS — R630 Anorexia: Secondary | ICD-10-CM | POA: Insufficient documentation

## 2015-03-26 LAB — CBC WITH DIFFERENTIAL/PLATELET
BASOS PCT: 0 % (ref 0–1)
Basophils Absolute: 0 10*3/uL (ref 0.0–0.1)
EOS ABS: 0 10*3/uL (ref 0.0–0.7)
Eosinophils Relative: 0 % (ref 0–5)
HEMATOCRIT: 40.5 % (ref 36.0–46.0)
Hemoglobin: 13.6 g/dL (ref 12.0–15.0)
LYMPHS PCT: 4 % — AB (ref 12–46)
Lymphs Abs: 0.3 10*3/uL — ABNORMAL LOW (ref 0.7–4.0)
MCH: 27.6 pg (ref 26.0–34.0)
MCHC: 33.6 g/dL (ref 30.0–36.0)
MCV: 82.3 fL (ref 78.0–100.0)
MONO ABS: 0.1 10*3/uL (ref 0.1–1.0)
Monocytes Relative: 2 % — ABNORMAL LOW (ref 3–12)
NEUTROS ABS: 6.4 10*3/uL (ref 1.7–7.7)
NEUTROS PCT: 94 % — AB (ref 43–77)
PLATELETS: 190 10*3/uL (ref 150–400)
RBC: 4.92 MIL/uL (ref 3.87–5.11)
RDW: 16.6 % — ABNORMAL HIGH (ref 11.5–15.5)
WBC: 6.8 10*3/uL (ref 4.0–10.5)

## 2015-03-26 LAB — COMPREHENSIVE METABOLIC PANEL
ALK PHOS: 71 U/L (ref 38–126)
ALT: 20 U/L (ref 14–54)
AST: 21 U/L (ref 15–41)
Albumin: 3.5 g/dL (ref 3.5–5.0)
Anion gap: 9 (ref 5–15)
BILIRUBIN TOTAL: 0.6 mg/dL (ref 0.3–1.2)
BUN: 9 mg/dL (ref 6–20)
CHLORIDE: 101 mmol/L (ref 101–111)
CO2: 25 mmol/L (ref 22–32)
Calcium: 8.8 mg/dL — ABNORMAL LOW (ref 8.9–10.3)
Creatinine, Ser: 0.73 mg/dL (ref 0.44–1.00)
GFR calc Af Amer: >60 mL/min (ref >60)
Glucose, Bld: 331 mg/dL — ABNORMAL HIGH (ref 65–99)
Potassium: 3.9 mmol/L (ref 3.5–5.1)
Sodium: 135 mmol/L (ref 135–145)
Total Protein: 6.7 g/dL (ref 6.5–8.1)

## 2015-03-26 LAB — URINALYSIS, ROUTINE W REFLEX MICROSCOPIC
BILIRUBIN URINE: NEGATIVE
Glucose, UA: >1000 mg/dL — AB
Ketones, ur: 40 mg/dL — AB
Leukocytes, UA: NEGATIVE
NITRITE: NEGATIVE
PROTEIN: NEGATIVE mg/dL
Specific Gravity, Urine: 1.04 — ABNORMAL HIGH (ref 1.005–1.030)
Urobilinogen, UA: 0.2 mg/dL (ref 0.0–1.0)
pH: 6 (ref 5.0–8.0)

## 2015-03-26 LAB — I-STAT CG4 LACTIC ACID, ED: LACTIC ACID, VENOUS: 2.19 mmol/L — AB (ref 0.5–2.0)

## 2015-03-26 LAB — LIPASE, BLOOD: Lipase: 36 U/L (ref 22–51)

## 2015-03-26 LAB — URINE MICROSCOPIC-ADD ON

## 2015-03-26 LAB — PREGNANCY, URINE: PREG TEST UR: NEGATIVE

## 2015-03-26 LAB — CBG MONITORING, ED: GLUCOSE-CAPILLARY: 189 mg/dL — AB (ref 65–99)

## 2015-03-26 MED ORDER — SODIUM CHLORIDE 0.9 % IV SOLN
INTRAVENOUS | Status: DC
  Administered 2015-03-26: 17:00:00 via INTRAVENOUS

## 2015-03-26 MED ORDER — IOHEXOL 300 MG/ML  SOLN
25.0000 mL | INTRAMUSCULAR | Status: AC
  Administered 2015-03-26: 25 mL via ORAL

## 2015-03-26 MED ORDER — HYDROMORPHONE HCL 1 MG/ML IJ SOLN
1.0000 mg | Freq: Once | INTRAMUSCULAR | Status: AC
  Administered 2015-03-26: 1 mg via INTRAVENOUS
  Filled 2015-03-26: qty 1

## 2015-03-26 MED ORDER — SODIUM CHLORIDE 0.9 % IV BOLUS (SEPSIS)
1000.0000 mL | Freq: Once | INTRAVENOUS | Status: AC
  Administered 2015-03-26: 1000 mL via INTRAVENOUS

## 2015-03-26 MED ORDER — FENTANYL CITRATE (PF) 100 MCG/2ML IJ SOLN
50.0000 ug | Freq: Once | INTRAMUSCULAR | Status: AC
  Administered 2015-03-26: 50 ug via INTRAVENOUS
  Filled 2015-03-26: qty 2

## 2015-03-26 MED ORDER — ACETAMINOPHEN 325 MG PO TABS
650.0000 mg | ORAL_TABLET | Freq: Once | ORAL | Status: AC
  Administered 2015-03-26: 650 mg via ORAL
  Filled 2015-03-26: qty 2

## 2015-03-26 MED ORDER — FENTANYL CITRATE (PF) 100 MCG/2ML IJ SOLN
25.0000 ug | Freq: Once | INTRAMUSCULAR | Status: AC
  Administered 2015-03-26: 25 ug via INTRAVENOUS
  Filled 2015-03-26: qty 2

## 2015-03-26 MED ORDER — OXYCODONE-ACETAMINOPHEN 5-325 MG PO TABS: 2.0000 | ORAL_TABLET | ORAL | Status: DC | PRN

## 2015-03-26 MED ORDER — IOHEXOL 300 MG/ML  SOLN
100.0000 mL | Freq: Once | INTRAMUSCULAR | Status: AC | PRN
  Administered 2015-03-26: 100 mL via INTRAVENOUS

## 2015-03-26 NOTE — ED Notes (Signed)
Patient returned from CT

## 2015-03-26 NOTE — ED Notes (Signed)
Lab results reported to Nurse Michelle. 

## 2015-03-26 NOTE — ED Provider Notes (Signed)
Patient rechecked at approximately 1800:  She is taking liquids. She has urinated 2. No acute abdomen. White count normal. CT abdomen pelvis shows no acute findings. Urinalysis negative. Will discharge with Percocet. Discussed findings with patient and her daughter and husband  Donnetta HutchingBrian Haylen Shelnutt, MD 03/26/15 1806

## 2015-03-26 NOTE — ED Notes (Addendum)
Phlebotomy called.  

## 2015-03-26 NOTE — Discharge Instructions (Signed)
Increase fluids. Medication for pain. Follow-up your primary care doctor or return here if worsening anyway.

## 2015-03-26 NOTE — ED Notes (Signed)
Finished with PO contrast.
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
## 2015-03-26 NOTE — ED Notes (Addendum)
Yesterday felt tired and nauseated, no vomiting.  At 0400 woke up with abdominal pain from epigastric area all the way down past navel. Felt hot and cold. Having frequent urination about every 5-10 minutes. Given Zofran 4mg  by EMS.

## 2015-06-18 ENCOUNTER — Encounter (HOSPITAL_COMMUNITY): Payer: Self-pay | Admitting: *Deleted

## 2015-06-18 ENCOUNTER — Emergency Department (HOSPITAL_COMMUNITY): Payer: No Typology Code available for payment source

## 2015-06-18 ENCOUNTER — Emergency Department (HOSPITAL_COMMUNITY)
Admission: EM | Admit: 2015-06-18 | Discharge: 2015-06-18 | Disposition: A | Discharge status: Home / Self Care | Payer: No Typology Code available for payment source | Attending: Emergency Medicine | Admitting: Emergency Medicine

## 2015-06-18 DIAGNOSIS — Y998 Other external cause status: Secondary | ICD-10-CM | POA: Diagnosis not present

## 2015-06-18 DIAGNOSIS — S161XXA Strain of muscle, fascia and tendon at neck level, initial encounter: Secondary | ICD-10-CM | POA: Diagnosis not present

## 2015-06-18 DIAGNOSIS — Z8659 Personal history of other mental and behavioral disorders: Secondary | ICD-10-CM | POA: Diagnosis not present

## 2015-06-18 DIAGNOSIS — S43402A Unspecified sprain of left shoulder joint, initial encounter: Secondary | ICD-10-CM | POA: Diagnosis not present

## 2015-06-18 DIAGNOSIS — Y92410 Unspecified street and highway as the place of occurrence of the external cause: Secondary | ICD-10-CM | POA: Diagnosis not present

## 2015-06-18 DIAGNOSIS — E119 Type 2 diabetes mellitus without complications: Secondary | ICD-10-CM | POA: Insufficient documentation

## 2015-06-18 DIAGNOSIS — R1110 Vomiting, unspecified: Secondary | ICD-10-CM | POA: Insufficient documentation

## 2015-06-18 DIAGNOSIS — I10 Essential (primary) hypertension: Secondary | ICD-10-CM | POA: Diagnosis not present

## 2015-06-18 DIAGNOSIS — Y9389 Activity, other specified: Secondary | ICD-10-CM | POA: Insufficient documentation

## 2015-06-18 DIAGNOSIS — S4992XA Unspecified injury of left shoulder and upper arm, initial encounter: Secondary | ICD-10-CM | POA: Diagnosis present

## 2015-06-18 MED ORDER — ACETAMINOPHEN 325 MG PO TABS
650.0000 mg | ORAL_TABLET | Freq: Once | ORAL | Status: AC
  Administered 2015-06-18: 650 mg via ORAL
  Filled 2015-06-18: qty 2

## 2015-06-18 NOTE — Discharge Instructions (Signed)
You have neck pain, possibly from a cervical strain and/or pinched nerve.  ° °SEEK IMMEDIATE MEDICAL ATTENTION IF: °You develop difficulties swallowing or breathing.  °You have new or worse numbness, weakness, tingling, or movement problems in your arms or legs.  °You develop increasing pain which is uncontrolled with medications.  °You have change in bowel or bladder function, or other concerns. ° ° ° °
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
## 2016-02-05 ENCOUNTER — Encounter (HOSPITAL_COMMUNITY): Payer: Self-pay | Admitting: Emergency Medicine

## 2016-02-05 DIAGNOSIS — I10 Essential (primary) hypertension: Secondary | ICD-10-CM | POA: Insufficient documentation

## 2016-02-05 DIAGNOSIS — E119 Type 2 diabetes mellitus without complications: Secondary | ICD-10-CM | POA: Insufficient documentation

## 2016-02-05 DIAGNOSIS — R51 Headache: Secondary | ICD-10-CM | POA: Insufficient documentation

## 2016-02-05 DIAGNOSIS — M542 Cervicalgia: Secondary | ICD-10-CM | POA: Insufficient documentation

## 2016-02-05 NOTE — ED Notes (Signed)
Pt. reports intermittent occipital headache with right facial pain radiating to right neck and shoulder onset 5 days ago , denies injury , no fever or chills.

## 2016-02-06 ENCOUNTER — Emergency Department (HOSPITAL_COMMUNITY)
Admission: EM | Admit: 2016-02-06 | Discharge: 2016-02-06 | Disposition: A | Discharge status: Home / Self Care | Payer: No Typology Code available for payment source | Attending: Emergency Medicine | Admitting: Emergency Medicine

## 2016-02-06 NOTE — ED Notes (Signed)
Registration reported to this RN that this patient had left without being seen.

## 2016-03-04 ENCOUNTER — Emergency Department (HOSPITAL_COMMUNITY)
Admission: EM | Admit: 2016-03-04 | Discharge: 2016-03-04 | Disposition: A | Discharge status: Home / Self Care | Payer: No Typology Code available for payment source | Attending: Emergency Medicine | Admitting: Emergency Medicine

## 2016-03-04 ENCOUNTER — Encounter (HOSPITAL_COMMUNITY): Payer: Self-pay | Admitting: Emergency Medicine

## 2016-03-04 ENCOUNTER — Inpatient Hospital Stay (HOSPITAL_COMMUNITY)
Admission: AD | Admit: 2016-03-04 | Discharge: 2016-03-04 | Discharge status: Against Medical Advice | Payer: No Typology Code available for payment source | Source: Ambulatory Visit | Attending: Family Medicine | Admitting: Family Medicine

## 2016-03-04 ENCOUNTER — Other Ambulatory Visit: Payer: Self-pay

## 2016-03-04 DIAGNOSIS — I10 Essential (primary) hypertension: Secondary | ICD-10-CM | POA: Insufficient documentation

## 2016-03-04 DIAGNOSIS — M79601 Pain in right arm: Secondary | ICD-10-CM | POA: Insufficient documentation

## 2016-03-04 DIAGNOSIS — E119 Type 2 diabetes mellitus without complications: Secondary | ICD-10-CM | POA: Insufficient documentation

## 2016-03-04 DIAGNOSIS — F419 Anxiety disorder, unspecified: Secondary | ICD-10-CM | POA: Insufficient documentation

## 2016-03-04 DIAGNOSIS — R079 Chest pain, unspecified: Secondary | ICD-10-CM | POA: Insufficient documentation

## 2016-03-04 DIAGNOSIS — I252 Old myocardial infarction: Secondary | ICD-10-CM | POA: Insufficient documentation

## 2016-03-04 DIAGNOSIS — M542 Cervicalgia: Secondary | ICD-10-CM | POA: Insufficient documentation

## 2016-03-04 DIAGNOSIS — R202 Paresthesia of skin: Secondary | ICD-10-CM | POA: Insufficient documentation

## 2016-03-04 DIAGNOSIS — K219 Gastro-esophageal reflux disease without esophagitis: Secondary | ICD-10-CM | POA: Insufficient documentation

## 2016-03-04 LAB — COMPREHENSIVE METABOLIC PANEL
ALK PHOS: 86 U/L (ref 38–126)
ALT: 25 U/L (ref 14–54)
AST: 23 U/L (ref 15–41)
Albumin: 3.7 g/dL (ref 3.5–5.0)
Anion gap: 12 (ref 5–15)
BUN: 14 mg/dL (ref 6–20)
CALCIUM: 9.6 mg/dL (ref 8.9–10.3)
CHLORIDE: 101 mmol/L (ref 101–111)
CO2: 24 mmol/L (ref 22–32)
CREATININE: 0.82 mg/dL (ref 0.44–1.00)
Glucose, Bld: 423 mg/dL — ABNORMAL HIGH (ref 65–99)
Potassium: 3.9 mmol/L (ref 3.5–5.1)
SODIUM: 137 mmol/L (ref 135–145)
Total Bilirubin: 0.5 mg/dL (ref 0.3–1.2)
Total Protein: 6.7 g/dL (ref 6.5–8.1)

## 2016-03-04 LAB — CBC WITH DIFFERENTIAL/PLATELET
Basophils Absolute: 0.1 10*3/uL (ref 0.0–0.1)
Basophils Relative: 1 %
EOS ABS: 0.2 10*3/uL (ref 0.0–0.7)
EOS PCT: 2 %
HCT: 42.7 % (ref 36.0–46.0)
Hemoglobin: 14.5 g/dL (ref 12.0–15.0)
Lymphocytes Relative: 37 %
Lymphs Abs: 2.5 10*3/uL (ref 0.7–4.0)
MCH: 31 pg (ref 26.0–34.0)
MCHC: 34 g/dL (ref 30.0–36.0)
MCV: 91.2 fL (ref 78.0–100.0)
MONOS PCT: 5 %
Monocytes Absolute: 0.3 10*3/uL (ref 0.1–1.0)
NEUTROS PCT: 55 %
Neutro Abs: 3.8 10*3/uL (ref 1.7–7.7)
PLATELETS: 239 10*3/uL (ref 150–400)
RBC: 4.68 MIL/uL (ref 3.87–5.11)
RDW: 12.5 % (ref 11.5–15.5)
WBC: 6.8 10*3/uL (ref 4.0–10.5)
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
## 2016-03-04 NOTE — MAU Note (Signed)
Pt c/o neck pain and arm pain x1 week. [facility] and they did an E[facility]-did not wait for results.

## 2016-03-04 NOTE — MAU Note (Signed)
Pt aware that [facility] her [facility]. Pt is persistent that she wants to wait for her daughter who is currently being evaluated in MAU. Risks discussed with pt is still adamant about waiting for daughter.

## 2016-03-04 NOTE — MAU Note (Signed)
Pt also c/o chest pain-feels like acid reflux. Worse when lying down.

## 2016-03-04 NOTE — ED Notes (Signed)
Pt. reports right neck pain , right arm pain and fingers tingling onset last week , denies chest pain / respirations unlabored , no injury or fall .

## 2016-03-05 ENCOUNTER — Emergency Department (HOSPITAL_COMMUNITY)
Admission: EM | Admit: 2016-03-05 | Discharge: 2016-03-05 | Disposition: A | Discharge status: Home / Self Care | Payer: Medicaid Other | Attending: Emergency Medicine | Admitting: Emergency Medicine

## 2016-03-05 ENCOUNTER — Encounter (HOSPITAL_COMMUNITY): Payer: Self-pay | Admitting: Emergency Medicine

## 2016-03-05 DIAGNOSIS — M79601 Pain in right arm: Secondary | ICD-10-CM | POA: Diagnosis not present

## 2016-03-05 DIAGNOSIS — E119 Type 2 diabetes mellitus without complications: Secondary | ICD-10-CM | POA: Insufficient documentation

## 2016-03-05 DIAGNOSIS — I10 Essential (primary) hypertension: Secondary | ICD-10-CM | POA: Insufficient documentation

## 2016-03-05 DIAGNOSIS — M542 Cervicalgia: Secondary | ICD-10-CM | POA: Insufficient documentation

## 2016-03-05 NOTE — ED Notes (Signed)
Pt. reports right side body aches onset last week , denies injury , ambulatory , no fever or chills. Pt. was here this evening but [facility] .

## 2016-03-06 ENCOUNTER — Encounter (HOSPITAL_COMMUNITY): Payer: Self-pay

## 2016-03-06 ENCOUNTER — Emergency Department (HOSPITAL_COMMUNITY)
Admission: EM | Admit: 2016-03-06 | Discharge: 2016-03-06 | Disposition: A | Discharge status: Home / Self Care | Payer: No Typology Code available for payment source | Attending: Emergency Medicine | Admitting: Emergency Medicine

## 2016-03-06 DIAGNOSIS — Z8659 Personal history of other mental and behavioral disorders: Secondary | ICD-10-CM | POA: Insufficient documentation

## 2016-03-06 DIAGNOSIS — E1165 Type 2 diabetes mellitus with hyperglycemia: Secondary | ICD-10-CM | POA: Insufficient documentation

## 2016-03-06 DIAGNOSIS — I10 Essential (primary) hypertension: Secondary | ICD-10-CM | POA: Insufficient documentation

## 2016-03-06 DIAGNOSIS — R739 Hyperglycemia, unspecified: Secondary | ICD-10-CM

## 2016-03-06 DIAGNOSIS — M79601 Pain in right arm: Secondary | ICD-10-CM | POA: Insufficient documentation

## 2016-03-06 DIAGNOSIS — M25511 Pain in right shoulder: Secondary | ICD-10-CM | POA: Insufficient documentation

## 2016-03-06 DIAGNOSIS — Z862 Personal history of diseases of the blood and blood-forming organs and certain disorders involving the immune mechanism: Secondary | ICD-10-CM | POA: Insufficient documentation

## 2016-03-06 LAB — CBC WITH DIFFERENTIAL/PLATELET
Basophils Absolute: 0 10*3/uL (ref 0.0–0.1)
Basophils Relative: 1 %
EOS ABS: 0.1 10*3/uL (ref 0.0–0.7)
EOS PCT: 2 %
HCT: 41.9 % (ref 36.0–46.0)
Hemoglobin: 14 g/dL (ref 12.0–15.0)
LYMPHS ABS: 2.1 10*3/uL (ref 0.7–4.0)
Lymphocytes Relative: 34 %
MCH: 30.8 pg (ref 26.0–34.0)
MCHC: 33.4 g/dL (ref 30.0–36.0)
MCV: 92.3 fL (ref 78.0–100.0)
MONOS PCT: 5 %
Monocytes Absolute: 0.3 10*3/uL (ref 0.1–1.0)
Neutro Abs: 3.6 10*3/uL (ref 1.7–7.7)
Neutrophils Relative %: 58 %
PLATELETS: 230 10*3/uL (ref 150–400)
RBC: 4.54 MIL/uL (ref 3.87–5.11)
RDW: 12.5 % (ref 11.5–15.5)
WBC: 6.2 10*3/uL (ref 4.0–10.5)

## 2016-03-06 LAB — I-STAT VENOUS BLOOD GAS, ED
ACID-BASE EXCESS: 2 mmol/L (ref 0.0–2.0)
Bicarbonate: 28.1 mEq/L — ABNORMAL HIGH (ref 20.0–24.0)
O2 Saturation: 45 %
PH VEN: 7.379 — AB (ref 7.250–7.300)
TCO2: 30 mmol/L (ref 0–100)
pCO2, Ven: 47.6 mmHg (ref 45.0–50.0)
pO2, Ven: 26 mmHg — ABNORMAL LOW (ref 31.0–45.0)

## 2016-03-06 LAB — COMPREHENSIVE METABOLIC PANEL
ALK PHOS: 77 U/L (ref 38–126)
ALT: 24 U/L (ref 14–54)
AST: 22 U/L (ref 15–41)
Albumin: 3.5 g/dL (ref 3.5–5.0)
Anion gap: 9 (ref 5–15)
BUN: 11 mg/dL (ref 6–20)
CALCIUM: 9.3 mg/dL (ref 8.9–10.3)
CO2: 26 mmol/L (ref 22–32)
CREATININE: 0.73 mg/dL (ref 0.44–1.00)
Chloride: 100 mmol/L — ABNORMAL LOW (ref 101–111)
GLUCOSE: 386 mg/dL — AB (ref 65–99)
Potassium: 4.4 mmol/L (ref 3.5–5.1)
Sodium: 135 mmol/L (ref 135–145)
Total Bilirubin: 0.7 mg/dL (ref 0.3–1.2)
Total Protein: 6.3 g/dL — ABNORMAL LOW (ref 6.5–8.1)

## 2016-03-06 LAB — URINALYSIS, ROUTINE W REFLEX MICROSCOPIC
BILIRUBIN URINE: NEGATIVE
Glucose, UA: >1000 mg/dL — AB
Hgb urine dipstick: NEGATIVE
KETONES UR: NEGATIVE mg/dL
Leukocytes, UA: NEGATIVE
NITRITE: NEGATIVE
PH: 5 (ref 5.0–8.0)
Protein, ur: NEGATIVE mg/dL
Specific Gravity, Urine: 1.04 — ABNORMAL HIGH (ref 1.005–1.030)

## 2016-03-06 LAB — URINE MICROSCOPIC-ADD ON

## 2016-03-06 LAB — LIPASE, BLOOD: Lipase: 54 U/L — ABNORMAL HIGH (ref 11–51)

## 2016-03-06 LAB — CBG MONITORING, ED: GLUCOSE-CAPILLARY: 357 mg/dL — AB (ref 65–99)

## 2016-03-06 LAB — D-DIMER, QUANTITATIVE (NOT AT ARMC): D DIMER QUANT: 0.34 ug{FEU}/mL (ref 0.00–0.50)

## 2016-03-06 MED ORDER — SODIUM CHLORIDE 0.9 % IV BOLUS (SEPSIS)
1000.0000 mL | Freq: Once | INTRAVENOUS | Status: AC
  Administered 2016-03-06: 1000 mL via INTRAVENOUS

## 2016-03-06 MED ORDER — HYDROCODONE-ACETAMINOPHEN 7.5-325 MG/15ML PO SOLN
10.0000 mL | Freq: Once | ORAL | Status: AC
Start: 2016-03-06 — End: 2016-03-06
  Administered 2016-03-06: 10 mL via ORAL
  Filled 2016-03-06: qty 15

## 2016-03-06 MED ORDER — KETOROLAC TROMETHAMINE 30 MG/ML IJ SOLN
30.0000 mg | Freq: Once | INTRAMUSCULAR | Status: AC
  Administered 2016-03-06: 30 mg via INTRAMUSCULAR
  Filled 2016-03-06: qty 1

## 2016-03-06 MED ORDER — ACETAMINOPHEN 500 MG PO TABS
1000.0000 mg | ORAL_TABLET | Freq: Once | ORAL | Status: AC
  Administered 2016-03-06: 1000 mg via ORAL
  Filled 2016-03-06: qty 2

## 2016-03-06 MED ORDER — METFORMIN HCL 1000 MG PO TABS: 1000.0000 mg | ORAL_TABLET | Freq: Two times a day (BID) | ORAL | Status: DC

## 2016-03-06 MED ORDER — HYDROCODONE-ACETAMINOPHEN 5-325 MG PO TABS: 1.0000 | ORAL_TABLET | Freq: Four times a day (QID) | ORAL | Status: DC | PRN

## 2016-03-06 MED ORDER — FENTANYL CITRATE (PF) 100 MCG/2ML IJ SOLN
25.0000 ug | Freq: Once | INTRAMUSCULAR | Status: AC
  Administered 2016-03-06: 25 ug via INTRAVENOUS
  Filled 2016-03-06: qty 2
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
## 2016-03-06 NOTE — ED Notes (Signed)
Assisted pt to restroom  

## 2016-03-06 NOTE — Discharge Instructions (Signed)
As discussed, your evaluation today has been largely reassuring.  But, it is important that you monitor your condition carefully, and do not hesitate to return to the ED if you develop new, or concerning changes in your condition. ? ?Otherwise, please follow-up with your physician for appropriate ongoing care. ? ?

## 2016-03-06 NOTE — Progress Notes (Addendum)
[facility] [place] regarding need for [facility].  Patient listed as not having insurance or a pcp.  Patient with pmhx of diabetes and HTN, blood sugar 360 per EDP.  EDCM will contact [facility] in attempts to make an appointment.    1900pm  EDCM discussed patient with Blossom HoopsEDRN [place] who placed patient on phone with [facility]. [facility] and has provided phone number [phone] for follow up call.  Contact information for [facility] placed on AVS.  No further EDCM needs at this time.

## 2016-03-06 NOTE — ED Notes (Signed)
Patient here with ongoing right side pain from ear to toes. Has been seen twice for same and no diagnosis. Patient states that it is painful to lift right arm and alert and oriented. Denies trauma. No facial droop, no arm drift

## 2016-03-10 ENCOUNTER — Telehealth: Payer: Self-pay

## 2016-03-10 NOTE — Telephone Encounter (Signed)
Message received from Radford PaxAmy Ferrero, RN CM requesting a [facility]. Two calls were  placed to # [phone] (H) and both times the phone rang 7 times and then the call ended. No option to leave a voicemail message   Update provided to A. Bennie DallasFerrero, RN CM

## 2016-03-11 ENCOUNTER — Telehealth: Payer: Self-pay

## 2016-03-11 NOTE — Telephone Encounter (Signed)
Call placed to the patient to discuss plans for hospital follow up . An appointment was scheduled for 03/14/16 @ 1200.  She was very appreciative of the appointment. Also explained to her that she can pick up an application for the orange card at the clinic on Friday and then schedule an appointment with the financial counselor.   Update provided to A. Bennie DallasFerrero, RN CM.

## 2016-03-14 ENCOUNTER — Encounter: Payer: Self-pay | Admitting: Physician Assistant

## 2016-03-14 ENCOUNTER — Ambulatory Visit: Payer: Self-pay | Attending: Physician Assistant | Admitting: Physician Assistant

## 2016-03-14 VITALS — BP 144/96 | HR 72 | Temp 97.6°F | Resp 14 | Ht 65.0 in | Wt 210.6 lb

## 2016-03-14 DIAGNOSIS — I10 Essential (primary) hypertension: Secondary | ICD-10-CM

## 2016-03-14 DIAGNOSIS — E119 Type 2 diabetes mellitus without complications: Secondary | ICD-10-CM | POA: Insufficient documentation

## 2016-03-14 DIAGNOSIS — E1165 Type 2 diabetes mellitus with hyperglycemia: Secondary | ICD-10-CM | POA: Insufficient documentation

## 2016-03-14 DIAGNOSIS — R739 Hyperglycemia, unspecified: Secondary | ICD-10-CM

## 2016-03-14 LAB — GLUCOSE, POCT (MANUAL RESULT ENTRY): POC Glucose: 261 mg/dl — AB (ref 70–99)

## 2016-03-14 MED ORDER — GABAPENTIN 300 MG PO CAPS: 300.0000 mg | ORAL_CAPSULE | Freq: Two times a day (BID) | ORAL | Status: DC

## 2016-03-14 MED ORDER — TRUE METRIX METER DEVI: 1.0000 | Freq: Three times a day (TID) | Status: DC

## 2016-03-14 MED ORDER — HYDROCHLOROTHIAZIDE 25 MG PO TABS: 25.0000 mg | ORAL_TABLET | Freq: Every day | ORAL | Status: DC

## 2016-03-14 MED ORDER — GLUCOSE BLOOD VI STRP: ORAL_STRIP | Status: DC

## 2016-03-14 MED ORDER — TRUEPLUS LANCETS 28G MISC: 1.0000 | Freq: Three times a day (TID) | Status: DC

## 2016-03-14 MED ORDER — METFORMIN HCL 1000 MG PO TABS: 1000.0000 mg | ORAL_TABLET | Freq: Two times a day (BID) | ORAL | Status: DC

## 2016-03-14 MED ORDER — AMITRIPTYLINE HCL 50 MG PO TABS: 50.0000 mg | ORAL_TABLET | Freq: Every day | ORAL | Status: DC

## 2016-03-14 MED ORDER — CLONIDINE HCL 0.1 MG PO TABS
0.1000 mg | ORAL_TABLET | Freq: Once | ORAL | Status: DC
Start: 2016-03-14 — End: 2019-01-13

## 2016-03-14 MED FILL — !TRUE METRIX BLOOD GLUCOSE: 365 days supply | Qty: 1 | Fill #0

## 2016-03-14 MED FILL — TRUEplus LANCETS 28G MISC: 33 days supply | Qty: 100 | Fill #0

## 2016-03-14 MED FILL — TRUE METRIX TEST STRIP: 25 days supply | Qty: 100 | Fill #0

## 2016-03-14 MED FILL — ?HYDROCHLOROTHIAZIDE 25 MG: 25 MG | 30 days supply | Qty: 30 | Fill #0

## 2016-03-14 MED FILL — AMITRIPTYLINE HCL 50 MG TAB: 50 | 30 days supply | Qty: 30 | Fill #0

## 2016-03-14 MED FILL — GABAPENTIN 300 MG CAPSULE: 300 | 30 days supply | Qty: 60 | Fill #0

## 2016-03-14 MED FILL — ?METFORMIN HCL 1,000 MG TAB: 1000 | 15 days supply | Qty: 30 | Fill #0

## 2016-03-14 NOTE — Patient Instructions (Addendum)
Watch/cut back on your sugar and salt intake Exercise 30 min 3 times per week Keep your follow up appt Get prescriptions today Check your surgar 3 timer per day and bring a log to next appt

## 2016-03-14 NOTE — Addendum Note (Signed)
Addended byScot Jun: NOEL, TIFFANY S on: 03/14/2016 12:27 PM   Modules accepted: Orders

## 2016-03-14 NOTE — Progress Notes (Signed)
Pt here for HFU for hyperglycemia. CBG today is 261. Pt complains of right sided pain from her shoulder radiating down to her hand. Pain rated as a 9 out of 10 described as throbbing, numbness, and tingling. Pt has been taking her metformin. Pt blood pressure elevated at 164/109. Pt reports she has HTN but is not on any medications for it.
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
## 2016-03-14 NOTE — Addendum Note (Signed)
Addended by: Benjamin StainBENNETT-CURSE, Franklin Baumbach L on: 03/14/2016 12:29 PM   Modules accepted: Orders

## 2016-03-14 NOTE — Addendum Note (Signed)
Addended byScot Jun: [name] S on: 03/14/2016 12:39 PM   Modules accepted: Orders, Medications

## 2016-03-15 LAB — MICROALBUMIN, URINE: MICROALB UR: 2.3 mg/dL

## 2016-04-01 ENCOUNTER — Ambulatory Visit: Payer: Medicaid Other | Attending: Family Medicine | Admitting: Family Medicine

## 2016-04-01 ENCOUNTER — Encounter: Payer: Self-pay | Admitting: Family Medicine

## 2016-04-01 VITALS — BP 124/88 | HR 86 | Temp 97.5°F | Resp 16 | Ht 65.0 in | Wt 206.8 lb

## 2016-04-01 DIAGNOSIS — M5412 Radiculopathy, cervical region: Secondary | ICD-10-CM

## 2016-04-01 DIAGNOSIS — E119 Type 2 diabetes mellitus without complications: Secondary | ICD-10-CM

## 2016-04-01 DIAGNOSIS — I10 Essential (primary) hypertension: Secondary | ICD-10-CM

## 2016-04-01 LAB — GLUCOSE, POCT (MANUAL RESULT ENTRY)
POC GLUCOSE: 347 mg/dL — AB (ref 70–99)
POC Glucose: 314 mg/dl — AB (ref 70–99)

## 2016-04-01 LAB — POCT GLYCOSYLATED HEMOGLOBIN (HGB A1C): HEMOGLOBIN A1C: 11.7

## 2016-04-01 MED ORDER — INSULIN ASPART 100 UNIT/ML ~~LOC~~ SOLN
8.0000 [IU] | Freq: Once | SUBCUTANEOUS | Status: AC
  Administered 2016-04-01: 8 [IU] via SUBCUTANEOUS

## 2016-04-01 MED ORDER — GABAPENTIN 300 MG PO CAPS: 300.0000 mg | ORAL_CAPSULE | Freq: Two times a day (BID) | ORAL | Status: DC

## 2016-04-01 MED ORDER — GLIPIZIDE 10 MG PO TABS: 10.0000 mg | ORAL_TABLET | Freq: Two times a day (BID) | ORAL | Status: DC

## 2016-04-01 MED FILL — glipiZIDE 10 MG TABS: 10 | 30 days supply | Qty: 60 | Fill #0
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
## 2016-04-01 NOTE — Patient Instructions (Signed)

## 2016-04-01 NOTE — Progress Notes (Signed)
Pt here for F/U for HTN. Pt reports pain on the right side of her neck that radiates down to her arm rated at a 10. Pt states amitryptiline and gabapentin are not helping for her diabetic neuropathy. Pt requests something for pain and something else for neuropathy. Pt states metformin makes her sleepy. CBG is 347 and A1C is 11.7.

## 2016-04-07 ENCOUNTER — Ambulatory Visit: Payer: No Typology Code available for payment source

## 2016-04-22 ENCOUNTER — Emergency Department (HOSPITAL_COMMUNITY)
Admission: EM | Admit: 2016-04-22 | Discharge: 2016-04-22 | Disposition: A | Discharge status: Home / Self Care | Payer: No Typology Code available for payment source | Attending: Emergency Medicine | Admitting: Emergency Medicine

## 2016-04-22 DIAGNOSIS — E1140 Type 2 diabetes mellitus with diabetic neuropathy, unspecified: Secondary | ICD-10-CM | POA: Insufficient documentation

## 2016-04-22 DIAGNOSIS — M79601 Pain in right arm: Secondary | ICD-10-CM | POA: Insufficient documentation

## 2016-04-22 DIAGNOSIS — Z7984 Long term (current) use of oral hypoglycemic drugs: Secondary | ICD-10-CM | POA: Insufficient documentation

## 2016-04-22 DIAGNOSIS — G8929 Other chronic pain: Secondary | ICD-10-CM | POA: Insufficient documentation

## 2016-04-22 DIAGNOSIS — I10 Essential (primary) hypertension: Secondary | ICD-10-CM | POA: Insufficient documentation

## 2016-04-22 DIAGNOSIS — Z79899 Other long term (current) drug therapy: Secondary | ICD-10-CM | POA: Insufficient documentation

## 2016-04-22 DIAGNOSIS — R21 Rash and other nonspecific skin eruption: Secondary | ICD-10-CM | POA: Insufficient documentation

## 2016-04-22 MED ORDER — TRIAMCINOLONE ACETONIDE 0.1 % EX CREA: 1.0000 "application " | TOPICAL_CREAM | Freq: Two times a day (BID) | CUTANEOUS | Status: DC

## 2016-04-22 MED ORDER — HYDROCORTISONE 2.5 % EX LOTN: TOPICAL_LOTION | Freq: Two times a day (BID) | CUTANEOUS | Status: DC

## 2016-04-22 NOTE — ED Notes (Signed)
Pt has rash all over body, pt states family hx of bed bugs. Denies seeing bugs. Rash are itchy.
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
## 2016-04-22 NOTE — Discharge Instructions (Signed)
You have been seen today for a rash. Use the triamcinolone cream on the affected areas as needed for itching. Refrain from itching as it can cause an infection. Follow up with PCP as needed should symptoms continue.

## 2016-04-22 NOTE — ED Notes (Signed)
Pt also c/o R arm pain that's chronic.
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
## 2016-04-25 ENCOUNTER — Ambulatory Visit: Payer: No Typology Code available for payment source | Admitting: Family Medicine

## 2016-07-09 ENCOUNTER — Encounter (HOSPITAL_COMMUNITY): Payer: Self-pay | Admitting: Student-PharmD

## 2016-12-31 ENCOUNTER — Encounter (HOSPITAL_COMMUNITY): Payer: Self-pay | Admitting: *Deleted

## 2016-12-31 ENCOUNTER — Emergency Department (HOSPITAL_COMMUNITY)
Admission: EM | Admit: 2016-12-31 | Discharge: 2016-12-31 | Disposition: A | Discharge status: Home / Self Care | Payer: Self-pay | Attending: Emergency Medicine | Admitting: Emergency Medicine

## 2016-12-31 ENCOUNTER — Emergency Department (HOSPITAL_COMMUNITY): Payer: Self-pay

## 2016-12-31 DIAGNOSIS — J029 Acute pharyngitis, unspecified: Secondary | ICD-10-CM | POA: Insufficient documentation

## 2016-12-31 DIAGNOSIS — Z79899 Other long term (current) drug therapy: Secondary | ICD-10-CM | POA: Insufficient documentation

## 2016-12-31 DIAGNOSIS — I10 Essential (primary) hypertension: Secondary | ICD-10-CM | POA: Insufficient documentation

## 2016-12-31 DIAGNOSIS — Z7984 Long term (current) use of oral hypoglycemic drugs: Secondary | ICD-10-CM | POA: Insufficient documentation

## 2016-12-31 DIAGNOSIS — E119 Type 2 diabetes mellitus without complications: Secondary | ICD-10-CM | POA: Insufficient documentation

## 2016-12-31 DIAGNOSIS — E010 Iodine-deficiency related diffuse (endemic) goiter: Secondary | ICD-10-CM | POA: Insufficient documentation

## 2016-12-31 LAB — BASIC METABOLIC PANEL
Anion gap: 10 (ref 5–15)
BUN: 10 mg/dL (ref 6–20)
CHLORIDE: 104 mmol/L (ref 101–111)
CO2: 25 mmol/L (ref 22–32)
CREATININE: 0.58 mg/dL (ref 0.44–1.00)
Calcium: 9.7 mg/dL (ref 8.9–10.3)
GFR calc Af Amer: >60 mL/min (ref >60)
GFR calc non Af Amer: >60 mL/min (ref >60)
Glucose, Bld: 250 mg/dL — ABNORMAL HIGH (ref 65–99)
Potassium: 4.1 mmol/L (ref 3.5–5.1)
SODIUM: 139 mmol/L (ref 135–145)

## 2016-12-31 LAB — CBC
HEMATOCRIT: 45.7 % (ref 36.0–46.0)
HEMOGLOBIN: 15.7 g/dL — AB (ref 12.0–15.0)
MCH: 32 pg (ref 26.0–34.0)
MCHC: 34.4 g/dL (ref 30.0–36.0)
MCV: 93.3 fL (ref 78.0–100.0)
Platelets: 233 10*3/uL (ref 150–400)
RBC: 4.9 MIL/uL (ref 3.87–5.11)
RDW: 12.5 % (ref 11.5–15.5)
WBC: 7.5 10*3/uL (ref 4.0–10.5)

## 2016-12-31 LAB — TSH: TSH: 2.031 u[IU]/mL (ref 0.350–4.500)

## 2016-12-31 MED ORDER — IOPAMIDOL (ISOVUE-300) INJECTION 61%
INTRAVENOUS | Status: AC
  Administered 2016-12-31: 75 mL
  Filled 2016-12-31: qty 75

## 2016-12-31 MED ORDER — ACETAMINOPHEN 500 MG PO TABS: 500.0000 mg | ORAL_TABLET | Freq: Four times a day (QID) | ORAL | 0 refills | Status: DC | PRN

## 2016-12-31 MED ORDER — AMOXICILLIN 500 MG PO CAPS: 500.0000 mg | ORAL_CAPSULE | Freq: Three times a day (TID) | ORAL | 0 refills | Status: DC

## 2016-12-31 MED ORDER — GI COCKTAIL ~~LOC~~
30.0000 mL | Freq: Once | ORAL | Status: AC
  Administered 2016-12-31: 30 mL via ORAL
  Filled 2016-12-31: qty 30

## 2016-12-31 MED ORDER — METOCLOPRAMIDE HCL 5 MG/ML IJ SOLN
10.0000 mg | Freq: Once | INTRAMUSCULAR | Status: AC
  Administered 2016-12-31: 10 mg via INTRAVENOUS
  Filled 2016-12-31: qty 2

## 2016-12-31 MED ORDER — ONDANSETRON HCL 4 MG/2ML IJ SOLN
4.0000 mg | Freq: Once | INTRAMUSCULAR | Status: AC
  Administered 2016-12-31: 4 mg via INTRAVENOUS
  Filled 2016-12-31: qty 2

## 2016-12-31 MED ORDER — HYDROMORPHONE HCL 2 MG/ML IJ SOLN
1.0000 mg | Freq: Once | INTRAMUSCULAR | Status: AC
  Administered 2016-12-31: 1 mg via INTRAVENOUS
  Filled 2016-12-31: qty 1

## 2016-12-31 MED ORDER — SODIUM CHLORIDE 0.9 % IV BOLUS (SEPSIS)
1000.0000 mL | Freq: Once | INTRAVENOUS | Status: AC
  Administered 2016-12-31: 1000 mL via INTRAVENOUS

## 2016-12-31 NOTE — ED Triage Notes (Addendum)
Pt reports having sore throat and fever/chills since Thursday. Reports pain is throat/neck and feels like it is swollen. Having difficulty swallowing any food. Airway intact at triage and able to speak in full sentences.
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
## 2018-02-01 ENCOUNTER — Encounter (HOSPITAL_COMMUNITY): Payer: Self-pay | Admitting: *Deleted

## 2018-02-01 ENCOUNTER — Emergency Department (HOSPITAL_COMMUNITY): Payer: Self-pay

## 2018-02-01 ENCOUNTER — Emergency Department (HOSPITAL_COMMUNITY)
Admission: EM | Admit: 2018-02-01 | Discharge: 2018-02-01 | Disposition: A | Discharge status: Home / Self Care | Payer: Self-pay | Attending: Emergency Medicine | Admitting: Emergency Medicine

## 2018-02-01 DIAGNOSIS — R69 Illness, unspecified: Secondary | ICD-10-CM

## 2018-02-01 DIAGNOSIS — I10 Essential (primary) hypertension: Secondary | ICD-10-CM | POA: Insufficient documentation

## 2018-02-01 DIAGNOSIS — Z7984 Long term (current) use of oral hypoglycemic drugs: Secondary | ICD-10-CM | POA: Insufficient documentation

## 2018-02-01 DIAGNOSIS — E1165 Type 2 diabetes mellitus with hyperglycemia: Secondary | ICD-10-CM | POA: Insufficient documentation

## 2018-02-01 DIAGNOSIS — R739 Hyperglycemia, unspecified: Secondary | ICD-10-CM

## 2018-02-01 DIAGNOSIS — J111 Influenza due to unidentified influenza virus with other respiratory manifestations: Secondary | ICD-10-CM | POA: Insufficient documentation

## 2018-02-01 LAB — COMPREHENSIVE METABOLIC PANEL
ALBUMIN: 3.5 g/dL (ref 3.5–5.0)
ALK PHOS: 62 U/L (ref 38–126)
ALT: 26 U/L (ref 14–54)
ANION GAP: 8 (ref 5–15)
AST: 22 U/L (ref 15–41)
BILIRUBIN TOTAL: 0.7 mg/dL (ref 0.3–1.2)
BUN: 5 mg/dL — AB (ref 6–20)
CALCIUM: 8.8 mg/dL — AB (ref 8.9–10.3)
CO2: 26 mmol/L (ref 22–32)
Chloride: 104 mmol/L (ref 101–111)
Creatinine, Ser: 0.71 mg/dL (ref 0.44–1.00)
GFR calc Af Amer: >60 mL/min (ref >60)
GFR calc non Af Amer: >60 mL/min (ref >60)
GLUCOSE: 179 mg/dL — AB (ref 65–99)
Potassium: 3.5 mmol/L (ref 3.5–5.1)
SODIUM: 138 mmol/L (ref 135–145)
Total Protein: 6.3 g/dL — ABNORMAL LOW (ref 6.5–8.1)

## 2018-02-01 LAB — CBC WITH DIFFERENTIAL/PLATELET
Basophils Absolute: 0 10*3/uL (ref 0.0–0.1)
Basophils Relative: 0 %
EOS ABS: 0 10*3/uL (ref 0.0–0.7)
Eosinophils Relative: 0 %
HEMATOCRIT: 42 % (ref 36.0–46.0)
HEMOGLOBIN: 14.2 g/dL (ref 12.0–15.0)
LYMPHS ABS: 1.4 10*3/uL (ref 0.7–4.0)
Lymphocytes Relative: 25 %
MCH: 31.5 pg (ref 26.0–34.0)
MCHC: 33.8 g/dL (ref 30.0–36.0)
MCV: 93.1 fL (ref 78.0–100.0)
MONO ABS: 0.4 10*3/uL (ref 0.1–1.0)
MONOS PCT: 6 %
NEUTROS ABS: 3.9 10*3/uL (ref 1.7–7.7)
Neutrophils Relative %: 69 %
Platelets: 187 10*3/uL (ref 150–400)
RBC: 4.51 MIL/uL (ref 3.87–5.11)
RDW: 12.4 % (ref 11.5–15.5)
WBC: 5.6 10*3/uL (ref 4.0–10.5)

## 2018-02-01 LAB — INFLUENZA PANEL BY PCR (TYPE A & B)
Influenza A By PCR: NEGATIVE
Influenza B By PCR: NEGATIVE

## 2018-02-01 LAB — I-STAT BETA HCG BLOOD, ED (MC, WL, AP ONLY): I-stat hCG, quantitative: <5 m[IU]/mL (ref <5)

## 2018-02-01 LAB — LIPASE, BLOOD: Lipase: 41 U/L (ref 11–51)

## 2018-02-01 MED ORDER — SODIUM CHLORIDE 0.9 % IV BOLUS
2000.0000 mL | Freq: Once | INTRAVENOUS | Status: AC
  Administered 2018-02-01: 2000 mL via INTRAVENOUS

## 2018-02-01 NOTE — ED Notes (Signed)
Patient transported to X-ray 

## 2018-02-01 NOTE — ED Notes (Signed)
Pt advised of need for urine sample

## 2018-02-01 NOTE — ED Triage Notes (Signed)
Generalized weakness and body aches, abdominal distention and headache.  Hypertensive for ems, 200/100.  Pt has had a fever and been tachy.  Chest pain which was 10/10 and ems gave her nitro which decreased pain to 8/10 and improved BP.  Pt has been noncompliant with lantus due to affordability.  Pt is alert and oriented.  Pt states that her grandchildren who live with her have recently been dx with flu.

## 2018-02-01 NOTE — Discharge Instructions (Addendum)
Get plenty of rest, drink a lot of fluids and gradually advance her diet.  For pain and fever use Tylenol or Motrin.  Your blood sugar was mildly elevated today at 170.  Follow-up with your doctor to check on your blood sugar and see if there are alternative medicines that you can better afford, to control your blood sugar.

## 2018-02-01 NOTE — ED Notes (Signed)
Pt back in room from X-ray.
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
## 2018-10-04 ENCOUNTER — Emergency Department (HOSPITAL_COMMUNITY)
Admission: EM | Admit: 2018-10-04 | Discharge: 2018-10-04 | Disposition: A | Discharge status: Home / Self Care | Payer: Self-pay | Attending: Emergency Medicine | Admitting: Emergency Medicine

## 2018-10-04 ENCOUNTER — Emergency Department (HOSPITAL_COMMUNITY): Payer: Self-pay

## 2018-10-04 DIAGNOSIS — M25552 Pain in left hip: Secondary | ICD-10-CM | POA: Insufficient documentation

## 2018-10-04 DIAGNOSIS — W19XXXA Unspecified fall, initial encounter: Secondary | ICD-10-CM

## 2018-10-04 DIAGNOSIS — W1781XA Fall down embankment (hill), initial encounter: Secondary | ICD-10-CM | POA: Insufficient documentation

## 2018-10-04 DIAGNOSIS — Y999 Unspecified external cause status: Secondary | ICD-10-CM | POA: Insufficient documentation

## 2018-10-04 DIAGNOSIS — S40011A Contusion of right shoulder, initial encounter: Secondary | ICD-10-CM | POA: Insufficient documentation

## 2018-10-04 DIAGNOSIS — E119 Type 2 diabetes mellitus without complications: Secondary | ICD-10-CM | POA: Insufficient documentation

## 2018-10-04 DIAGNOSIS — S8392XA Sprain of unspecified site of left knee, initial encounter: Secondary | ICD-10-CM | POA: Insufficient documentation

## 2018-10-04 DIAGNOSIS — I10 Essential (primary) hypertension: Secondary | ICD-10-CM | POA: Insufficient documentation

## 2018-10-04 DIAGNOSIS — Y9301 Activity, walking, marching and hiking: Secondary | ICD-10-CM | POA: Insufficient documentation

## 2018-10-04 DIAGNOSIS — M79662 Pain in left lower leg: Secondary | ICD-10-CM | POA: Insufficient documentation

## 2018-10-04 DIAGNOSIS — Y9289 Other specified places as the place of occurrence of the external cause: Secondary | ICD-10-CM | POA: Insufficient documentation

## 2018-10-04 DIAGNOSIS — S93402A Sprain of unspecified ligament of left ankle, initial encounter: Secondary | ICD-10-CM | POA: Insufficient documentation

## 2018-10-04 MED ORDER — OXYCODONE-ACETAMINOPHEN 5-325 MG PO TABS: 1.0000 | ORAL_TABLET | Freq: Four times a day (QID) | ORAL | 0 refills | Status: DC | PRN

## 2018-10-04 MED ORDER — OXYCODONE-ACETAMINOPHEN 5-325 MG PO TABS
1.0000 | ORAL_TABLET | Freq: Once | ORAL | Status: AC
  Administered 2018-10-04: 1 via ORAL
  Filled 2018-10-04: qty 1

## 2018-10-04 MED ORDER — CYCLOBENZAPRINE HCL 10 MG PO TABS: 10.0000 mg | ORAL_TABLET | Freq: Two times a day (BID) | ORAL | 0 refills | Status: DC | PRN

## 2018-10-04 NOTE — ED Notes (Signed)
Pt ambulating to bathroom.

## 2018-10-04 NOTE — ED Notes (Signed)
Ortho tech at bedside 

## 2018-10-04 NOTE — Discharge Instructions (Signed)
Make sure you elevate the leg and ice it when you can.  Wear the Aircast until the pain is improved to support your ankle.

## 2018-10-04 NOTE — Progress Notes (Signed)
Orthopedic Tech Progress Note Patient Details:  [name] 06-25-1966 [id number]  Ortho Devices Type of Ortho Device: Ankle Air splint Ortho Device/Splint Location: lle Ortho Device/Splint Interventions: Ordered, Application, Adjustment   Post Interventions Patient Tolerated: Well Instructions Provided: Care of device, Adjustment of device   Trinna PostMartinez, [name] 10/04/2018, 5:50 PM

## 2018-10-04 NOTE — ED Notes (Signed)
Ortho tech paged for air cast.
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
## 2018-10-04 NOTE — ED Notes (Signed)
Patient verbalizes understanding of discharge instructions. Opportunity for questioning and answers were provided. Armband removed by staff, pt discharged from ED. Prescriptions reviewed. 

## 2018-10-04 NOTE — ED Triage Notes (Addendum)
Pt fell this morning at approx 0930. Mechanical fall on muddy hill while walking. Pt states her leg was pulled backward when she fell. Pt c/o hip and knee pain on left side. No obvious deformity but minor swelling noted to left anterior malleolus at time of triage. Pt is alert and oriented x4. Denies LOC or dizziness prior to fall.

## 2019-01-13 ENCOUNTER — Encounter (HOSPITAL_COMMUNITY): Payer: Self-pay | Admitting: Emergency Medicine

## 2019-01-13 ENCOUNTER — Other Ambulatory Visit: Payer: Self-pay

## 2019-01-13 ENCOUNTER — Ambulatory Visit (HOSPITAL_COMMUNITY)
Admission: EM | Admit: 2019-01-13 | Discharge: 2019-01-13 | Disposition: A | Discharge status: Home / Self Care | Payer: Self-pay | Attending: Family Medicine | Admitting: Family Medicine

## 2019-01-13 DIAGNOSIS — K529 Noninfective gastroenteritis and colitis, unspecified: Secondary | ICD-10-CM

## 2019-01-13 DIAGNOSIS — R52 Pain, unspecified: Secondary | ICD-10-CM

## 2019-01-13 MED ORDER — ONDANSETRON 4 MG PO TBDP
ORAL_TABLET | ORAL | Status: AC
  Filled 2019-01-13: qty 1

## 2019-01-13 MED ORDER — ONDANSETRON HCL 4 MG PO TABS: 4.0000 mg | ORAL_TABLET | Freq: Four times a day (QID) | ORAL | 0 refills | Status: DC

## 2019-01-13 MED ORDER — ONDANSETRON 4 MG PO TBDP
4.0000 mg | ORAL_TABLET | Freq: Once | ORAL | Status: AC
  Administered 2019-01-13: 4 mg via ORAL

## 2019-01-13 NOTE — ED Triage Notes (Signed)
Pt reports body aches that started last night.  She states it has been getting progressively worse today.  She denies any fever.

## 2019-01-13 NOTE — ED Notes (Signed)
Patient able to ambulate independently  

## 2019-01-13 NOTE — Discharge Instructions (Addendum)
Take Zofran as needed for nausea and vomiting While at the pharmacy pick up Imodium.  This is good for diarrhea Drink plenty of fluids Get plenty of rest As soon as you can tolerate start a bland diet Avoid fried foods fatty foods spicy foods for couple of weeks Take X strength Tylenol 500 mg, 2 tabs 3-4 times a day for pain.  Maximum 4000 mg a day
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
## 2019-07-16 ENCOUNTER — Encounter (HOSPITAL_COMMUNITY): Payer: Self-pay

## 2019-07-16 ENCOUNTER — Ambulatory Visit (HOSPITAL_COMMUNITY)
Admission: EM | Admit: 2019-07-16 | Discharge: 2019-07-16 | Disposition: A | Discharge status: Home / Self Care | Payer: Self-pay | Attending: Emergency Medicine | Admitting: Emergency Medicine

## 2019-07-16 ENCOUNTER — Other Ambulatory Visit: Payer: Self-pay

## 2019-07-16 DIAGNOSIS — E1169 Type 2 diabetes mellitus with other specified complication: Secondary | ICD-10-CM

## 2019-07-16 DIAGNOSIS — M792 Neuralgia and neuritis, unspecified: Secondary | ICD-10-CM

## 2019-07-16 DIAGNOSIS — I159 Secondary hypertension, unspecified: Secondary | ICD-10-CM

## 2019-07-16 LAB — POCT URINALYSIS DIP (DEVICE)
Bilirubin Urine: NEGATIVE
Glucose, UA: 500 mg/dL — AB
Hgb urine dipstick: NEGATIVE
Ketones, ur: NEGATIVE mg/dL
Leukocytes,Ua: NEGATIVE
Nitrite: NEGATIVE
Protein, ur: 30 mg/dL — AB
Specific Gravity, Urine: >=1.03 (ref 1.005–1.030)
Urobilinogen, UA: 0.2 mg/dL (ref 0.0–1.0)
pH: 5.5 (ref 5.0–8.0)

## 2019-07-16 LAB — GLUCOSE, CAPILLARY: Glucose-Capillary: 205 mg/dL — ABNORMAL HIGH (ref 70–99)

## 2019-07-16 MED ORDER — AMLODIPINE BESYLATE 5 MG PO TABS: 5.0000 mg | ORAL_TABLET | Freq: Every day | ORAL | 0 refills | Status: DC

## 2019-07-16 MED ORDER — GLUCOSE BLOOD VI STRP: ORAL_STRIP | 2 refills | Status: DC

## 2019-07-16 MED ORDER — METFORMIN HCL 500 MG PO TABS: 500.0000 mg | ORAL_TABLET | Freq: Every day | ORAL | 0 refills | Status: DC

## 2019-07-16 MED ORDER — GABAPENTIN 300 MG PO CAPS: 300.0000 mg | ORAL_CAPSULE | Freq: Every day | ORAL | 0 refills | Status: DC

## 2019-07-16 MED ORDER — FREESTYLE SYSTEM KIT: 1.0000 | PACK | Freq: Every day | 0 refills | Status: DC
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
## 2019-07-16 NOTE — ED Notes (Signed)
Reported CBG value to Nurse Kalman Shan

## 2019-07-16 NOTE — ED Triage Notes (Signed)
Pt presents with recurrent  left hand pain

## 2019-07-16 NOTE — Discharge Instructions (Addendum)
Need to follow-up with [facility] as soon as you possibly can.  I have also ordered a primary care referral so somebody should be contacting you soon to help you establish with a primary care provider.  I am getting you started on your diabetes and high blood pressure medications.  Neurontin 300 mg at night will help with your hand pain, metformin 500 mg at night will help with your diabetes.  Measure your sugar once in the morning before you eat and after dinner.  Keep a log of this.  Decrease your salt intake. diet and exercise will lower your blood pressure significantly. It is important to keep your blood pressure under good control, as having a elevated blood pressure for prolonged periods of time significantly increases your risk of stroke, heart attacks, kidney damage, eye damage, and other problems. Measure your blood pressure once a day, preferably at the same time every day. Keep a log of this and bring it to your next doctor's appointment.  Bring your blood pressure cuff as well. Return immediately to the ER if you start having chest pain, headache, problems seeing, problems talking, problems walking, if you feel like you're about to pass out, if you do pass out, if you have a seizure, or for any other concerns.  Go to www.goodrx.com to look up your medications. This will give you a list of where you can find your prescriptions at the most affordable prices. Or ask the pharmacist what the cash price is, or if they have any other discount programs available to help make your medication more affordable. This can be less expensive than what you would pay with insurance.

## 2019-07-22 ENCOUNTER — Encounter (HOSPITAL_COMMUNITY): Payer: Self-pay

## 2019-07-22 ENCOUNTER — Other Ambulatory Visit: Payer: Self-pay

## 2019-07-22 ENCOUNTER — Ambulatory Visit (HOSPITAL_COMMUNITY)
Admission: EM | Admit: 2019-07-22 | Discharge: 2019-07-22 | Disposition: A | Discharge status: Home / Self Care | Payer: Self-pay | Attending: Family Medicine | Admitting: Family Medicine

## 2019-07-22 ENCOUNTER — Ambulatory Visit (INDEPENDENT_AMBULATORY_CARE_PROVIDER_SITE_OTHER): Payer: Self-pay

## 2019-07-22 DIAGNOSIS — M79642 Pain in left hand: Secondary | ICD-10-CM

## 2019-07-22 DIAGNOSIS — I10 Essential (primary) hypertension: Secondary | ICD-10-CM

## 2019-07-22 NOTE — ED Triage Notes (Signed)
Pt presents with same recurrent pain in left hand that has not resolved with prescribed medications from last visit; pt states she is not able to keep grasp on things with that hand.

## 2019-07-22 NOTE — Discharge Instructions (Signed)
Please try voltaren that is over the counter.  Please follow up with primary care to help with your diabetes and blood pressure.  Please try tylenol for pain  Please take the gabapentin. You can increase this to 2 or 3 times daily as you tolerate.
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
## 2019-07-25 ENCOUNTER — Other Ambulatory Visit: Payer: Self-pay

## 2019-07-25 ENCOUNTER — Ambulatory Visit (HOSPITAL_COMMUNITY)
Admission: EM | Admit: 2019-07-25 | Discharge: 2019-07-25 | Disposition: A | Discharge status: Home / Self Care | Payer: Self-pay | Attending: Family Medicine | Admitting: Family Medicine

## 2019-07-25 ENCOUNTER — Encounter (HOSPITAL_COMMUNITY): Payer: Self-pay | Admitting: Emergency Medicine

## 2019-07-25 DIAGNOSIS — Z833 Family history of diabetes mellitus: Secondary | ICD-10-CM | POA: Insufficient documentation

## 2019-07-25 DIAGNOSIS — J111 Influenza due to unidentified influenza virus with other respiratory manifestations: Secondary | ICD-10-CM

## 2019-07-25 DIAGNOSIS — Z8249 Family history of ischemic heart disease and other diseases of the circulatory system: Secondary | ICD-10-CM | POA: Insufficient documentation

## 2019-07-25 DIAGNOSIS — Z20828 Contact with and (suspected) exposure to other viral communicable diseases: Secondary | ICD-10-CM | POA: Insufficient documentation

## 2019-07-25 DIAGNOSIS — Z886 Allergy status to analgesic agent status: Secondary | ICD-10-CM | POA: Insufficient documentation

## 2019-07-25 DIAGNOSIS — R69 Illness, unspecified: Secondary | ICD-10-CM | POA: Insufficient documentation

## 2019-07-25 DIAGNOSIS — I10 Essential (primary) hypertension: Secondary | ICD-10-CM

## 2019-07-25 DIAGNOSIS — Z809 Family history of malignant neoplasm, unspecified: Secondary | ICD-10-CM | POA: Insufficient documentation

## 2019-07-25 DIAGNOSIS — Z79899 Other long term (current) drug therapy: Secondary | ICD-10-CM | POA: Insufficient documentation

## 2019-07-25 DIAGNOSIS — Z7984 Long term (current) use of oral hypoglycemic drugs: Secondary | ICD-10-CM | POA: Insufficient documentation

## 2019-07-25 DIAGNOSIS — Z91018 Allergy to other foods: Secondary | ICD-10-CM | POA: Insufficient documentation

## 2019-07-25 DIAGNOSIS — Z20822 Contact with and (suspected) exposure to covid-19: Secondary | ICD-10-CM

## 2019-07-25 DIAGNOSIS — Z888 Allergy status to other drugs, medicaments and biological substances status: Secondary | ICD-10-CM | POA: Insufficient documentation

## 2019-07-25 DIAGNOSIS — R52 Pain, unspecified: Secondary | ICD-10-CM

## 2019-07-25 DIAGNOSIS — Z885 Allergy status to narcotic agent status: Secondary | ICD-10-CM | POA: Insufficient documentation

## 2019-07-25 DIAGNOSIS — E1142 Type 2 diabetes mellitus with diabetic polyneuropathy: Secondary | ICD-10-CM | POA: Insufficient documentation

## 2019-07-25 DIAGNOSIS — Z1159 Encounter for screening for other viral diseases: Secondary | ICD-10-CM

## 2019-07-25 MED ORDER — KETOROLAC TROMETHAMINE 60 MG/2ML IM SOLN
INTRAMUSCULAR | Status: AC
  Filled 2019-07-25: qty 2

## 2019-07-25 MED ORDER — KETOROLAC TROMETHAMINE 60 MG/2ML IM SOLN
60.0000 mg | Freq: Once | INTRAMUSCULAR | Status: AC
  Administered 2019-07-25: 20:00:00 60 mg via INTRAMUSCULAR

## 2019-07-25 NOTE — Discharge Instructions (Signed)
Rest.  Drink plenty of fluids Take Tylenol for pain or fever You must quarantine at home until your coronavirus test is available Call tomorrow to set up an appointment with a primary care doctor for ongoing medical care

## 2019-07-25 NOTE — ED Triage Notes (Signed)
Pt here for body aches; pt sts seen here recently and given med refill
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
## 2019-07-27 ENCOUNTER — Encounter (HOSPITAL_COMMUNITY): Payer: Self-pay

## 2019-07-27 LAB — NOVEL CORONAVIRUS, NAA (HOSP ORDER, SEND-OUT TO REF LAB; TAT 18-24 HRS): SARS-CoV-2, NAA: NOT DETECTED

## 2019-08-03 ENCOUNTER — Ambulatory Visit (HOSPITAL_COMMUNITY)
Admission: EM | Admit: 2019-08-03 | Discharge: 2019-08-03 | Disposition: A | Discharge status: Home / Self Care | Payer: Self-pay | Attending: Urgent Care | Admitting: Urgent Care

## 2019-08-03 ENCOUNTER — Encounter (HOSPITAL_COMMUNITY): Payer: Self-pay

## 2019-08-03 DIAGNOSIS — I10 Essential (primary) hypertension: Secondary | ICD-10-CM

## 2019-08-03 DIAGNOSIS — H5789 Other specified disorders of eye and adnexa: Secondary | ICD-10-CM

## 2019-08-03 DIAGNOSIS — H02846 Edema of left eye, unspecified eyelid: Secondary | ICD-10-CM

## 2019-08-03 DIAGNOSIS — H0289 Other specified disorders of eyelid: Secondary | ICD-10-CM

## 2019-08-03 DIAGNOSIS — L03213 Periorbital cellulitis: Secondary | ICD-10-CM

## 2019-08-03 MED ORDER — CEFDINIR 300 MG PO CAPS: 300.0000 mg | ORAL_CAPSULE | Freq: Two times a day (BID) | ORAL | 0 refills | Status: DC
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
## 2019-08-03 NOTE — ED Triage Notes (Signed)
Pt report he left eye is swollen for 1 week, pain left side of the face, mouth pain.

## 2019-08-03 NOTE — Discharge Instructions (Signed)
Please schedule and use Tylenol for pain as you have allergies to aspirin, tramadol hydrocodone, makes Tylenol the only viable medication to take.  You can use a dose of 500 to 650 mg every 6 hours as needed for pain.  Make sure that you contact an ophthalmologist either your own or one provided to you on your visit summary.

## 2019-08-19 ENCOUNTER — Emergency Department (HOSPITAL_COMMUNITY)
Admission: EM | Admit: 2019-08-19 | Discharge: 2019-08-19 | Disposition: A | Discharge status: Home / Self Care | Payer: No Typology Code available for payment source | Attending: Emergency Medicine | Admitting: Emergency Medicine

## 2019-08-19 ENCOUNTER — Emergency Department (HOSPITAL_COMMUNITY): Payer: No Typology Code available for payment source

## 2019-08-19 ENCOUNTER — Other Ambulatory Visit: Payer: Self-pay

## 2019-08-19 DIAGNOSIS — E119 Type 2 diabetes mellitus without complications: Secondary | ICD-10-CM | POA: Insufficient documentation

## 2019-08-19 DIAGNOSIS — M7918 Myalgia, other site: Secondary | ICD-10-CM | POA: Diagnosis present

## 2019-08-19 DIAGNOSIS — Z7984 Long term (current) use of oral hypoglycemic drugs: Secondary | ICD-10-CM | POA: Diagnosis not present

## 2019-08-19 DIAGNOSIS — Z79899 Other long term (current) drug therapy: Secondary | ICD-10-CM | POA: Diagnosis not present

## 2019-08-19 LAB — CBG MONITORING, ED: Glucose-Capillary: 179 mg/dL — ABNORMAL HIGH (ref 70–99)

## 2019-08-19 MED ORDER — METHOCARBAMOL 500 MG PO TABS: 500.0000 mg | ORAL_TABLET | Freq: Two times a day (BID) | ORAL | 0 refills | Status: AC

## 2019-08-19 MED ORDER — METHOCARBAMOL 500 MG PO TABS: 750.0000 mg | ORAL_TABLET | Freq: Once | ORAL | Status: DC

## 2019-08-19 NOTE — ED Notes (Addendum)
Pt verbally understood d/c instructions and prescription information. Pt stable and ambulatory upon d/c.

## 2019-08-19 NOTE — Discharge Instructions (Addendum)
I have prescribed muscle relaxers for your pain, please do not drink or drive while taking this medications as they can make you drowsy.  Please follow-up with PCP in 1 week for reevaluation of your symptoms.  You experience any bowel or bladder incontinence, fever, worsening in your symptoms please return to the ED. ° °
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
## 2019-08-25 ENCOUNTER — Other Ambulatory Visit: Payer: Self-pay

## 2019-08-25 ENCOUNTER — Encounter (HOSPITAL_COMMUNITY): Payer: Self-pay

## 2019-08-25 ENCOUNTER — Ambulatory Visit (HOSPITAL_COMMUNITY)
Admission: EM | Admit: 2019-08-25 | Discharge: 2019-08-25 | Disposition: A | Discharge status: Home / Self Care | Payer: Self-pay | Attending: Family Medicine | Admitting: Family Medicine

## 2019-08-25 ENCOUNTER — Ambulatory Visit (INDEPENDENT_AMBULATORY_CARE_PROVIDER_SITE_OTHER): Payer: Self-pay

## 2019-08-25 DIAGNOSIS — S161XXA Strain of muscle, fascia and tendon at neck level, initial encounter: Secondary | ICD-10-CM

## 2019-08-25 DIAGNOSIS — E119 Type 2 diabetes mellitus without complications: Secondary | ICD-10-CM

## 2019-08-25 DIAGNOSIS — M25561 Pain in right knee: Secondary | ICD-10-CM

## 2019-08-25 MED ORDER — KETOROLAC TROMETHAMINE 30 MG/ML IJ SOLN
30.0000 mg | Freq: Once | INTRAMUSCULAR | Status: AC
  Administered 2019-08-25: 17:00:00 30 mg via INTRAMUSCULAR

## 2019-08-25 MED ORDER — KETOROLAC TROMETHAMINE 30 MG/ML IJ SOLN
INTRAMUSCULAR | Status: AC
  Filled 2019-08-25: qty 1

## 2019-08-25 MED ORDER — NAPROXEN 500 MG PO TABS: 500.0000 mg | ORAL_TABLET | Freq: Two times a day (BID) | ORAL | 0 refills | Status: DC
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
## 2019-08-25 NOTE — Discharge Instructions (Signed)
We gave you toradol before leaving today Continue with anti-inflammatories beginning tomorrow, naprosyn twice daily with food OR You may take up to 800 mg Ibuprofen every 8 hours with food. You may supplement Ibuprofen with Tylenol [id number] mg every 8 hours.  You may use flexeril as needed to help with pain. This is a muscle relaxer and causes sedation- please use only at bedtime or when you will be home and not have to drive/work  Gentle stretching and exercises of shoulder/neck to prevent further stiffness and pain  Follow up if not resolving or worsening
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
## 2019-09-24 ENCOUNTER — Encounter (HOSPITAL_COMMUNITY): Payer: Self-pay

## 2019-09-24 ENCOUNTER — Other Ambulatory Visit: Payer: Self-pay

## 2019-09-24 ENCOUNTER — Emergency Department (HOSPITAL_COMMUNITY)
Admission: EM | Admit: 2019-09-24 | Discharge: 2019-09-24 | Disposition: A | Discharge status: Home / Self Care | Payer: Self-pay | Attending: Emergency Medicine | Admitting: Emergency Medicine

## 2019-09-24 ENCOUNTER — Emergency Department (HOSPITAL_COMMUNITY): Payer: Self-pay

## 2019-09-24 DIAGNOSIS — Z5321 Procedure and treatment not carried out due to patient leaving prior to being seen by health care provider: Secondary | ICD-10-CM | POA: Diagnosis not present

## 2019-09-24 DIAGNOSIS — R079 Chest pain, unspecified: Secondary | ICD-10-CM | POA: Diagnosis not present

## 2019-09-24 LAB — CBC
HCT: 41.7 % (ref 36.0–46.0)
Hemoglobin: 14.3 g/dL (ref 12.0–15.0)
MCH: 32.1 pg (ref 26.0–34.0)
MCHC: 34.3 g/dL (ref 30.0–36.0)
MCV: 93.7 fL (ref 80.0–100.0)
Platelets: 239 10*3/uL (ref 150–400)
RBC: 4.45 MIL/uL (ref 3.87–5.11)
RDW: 12.1 % (ref 11.5–15.5)
WBC: 6.8 10*3/uL (ref 4.0–10.5)
nRBC: 0 % (ref 0.0–0.2)

## 2019-09-24 LAB — BASIC METABOLIC PANEL
Anion gap: 9 (ref 5–15)
BUN: 18 mg/dL (ref 6–20)
CO2: 24 mmol/L (ref 22–32)
Calcium: 9.1 mg/dL (ref 8.9–10.3)
Chloride: 103 mmol/L (ref 98–111)
Creatinine, Ser: 0.77 mg/dL (ref 0.44–1.00)
GFR calc Af Amer: >60 mL/min (ref >60)
GFR calc non Af Amer: >60 mL/min (ref >60)
Glucose, Bld: 318 mg/dL — ABNORMAL HIGH (ref 70–99)
Potassium: 4.4 mmol/L (ref 3.5–5.1)
Sodium: 136 mmol/L (ref 135–145)

## 2019-09-24 LAB — TROPONIN I (HIGH SENSITIVITY): Troponin I (High Sensitivity): 44 ng/L — ABNORMAL HIGH (ref <18)

## 2019-09-24 LAB — I-STAT BETA HCG BLOOD, ED (MC, WL, AP ONLY): I-stat hCG, quantitative: <5 m[IU]/mL (ref <5)

## 2019-09-24 MED ORDER — SODIUM CHLORIDE 0.9% FLUSH: 3.0000 mL | Freq: Once | INTRAVENOUS | Status: DC

## 2019-09-24 NOTE — ED Notes (Signed)
Pt and pt daughter to desk. Pt states that she "just had an anxiety attack" but was unaware that her daughter had her medication. Pt wants to leave AMA. RN removed IV and pt is leaving.

## 2019-09-24 NOTE — ED Notes (Signed)
Pt verbalized she was leaving AMA from the waiting room. IV removed, and pt left w/ daughter.

## 2019-09-24 NOTE — ED Notes (Signed)
Pt told this RN that her IV was hurting her and it needed to be taken out. Pt then states she wanted to leave because she was tired of waiting and there were too many people here. This RN removed pt's IV and pt left.

## 2019-09-24 NOTE — ED Triage Notes (Signed)
Pt from home with ems for c.o chest pain that radiates to her right arm, started about 1 hr ago after eating dinner, hx of MI 5 years ago. Pain went from 10/10 to a 5/10 after 3 Nitros. Denies any other symptoms. Pt hypertensive, out of Bp meds for 2 weeks.   Last BP 161/100,  SBP over 200 initially CBG 347

## 2019-11-01 ENCOUNTER — Ambulatory Visit: Payer: Self-pay | Attending: Family Medicine | Admitting: Family Medicine

## 2019-11-01 ENCOUNTER — Other Ambulatory Visit: Payer: Self-pay

## 2019-11-01 ENCOUNTER — Encounter: Payer: Self-pay | Admitting: Family Medicine

## 2019-11-01 VITALS — BP 133/76 | HR 60 | Temp 98.3°F | Ht 65.0 in | Wt 204.0 lb

## 2019-11-01 DIAGNOSIS — I10 Essential (primary) hypertension: Secondary | ICD-10-CM

## 2019-11-01 DIAGNOSIS — M25551 Pain in right hip: Secondary | ICD-10-CM

## 2019-11-01 DIAGNOSIS — E1159 Type 2 diabetes mellitus with other circulatory complications: Secondary | ICD-10-CM

## 2019-11-01 DIAGNOSIS — I214 Non-ST elevation (NSTEMI) myocardial infarction: Secondary | ICD-10-CM

## 2019-11-01 DIAGNOSIS — Z1159 Encounter for screening for other viral diseases: Secondary | ICD-10-CM

## 2019-11-01 LAB — POCT GLYCOSYLATED HEMOGLOBIN (HGB A1C): HbA1c, POC (controlled diabetic range): 9.8 % — AB (ref 0.0–7.0)

## 2019-11-01 LAB — GLUCOSE, POCT (MANUAL RESULT ENTRY): POC Glucose: 199 mg/dl — AB (ref 70–99)

## 2019-11-01 MED ORDER — GLIPIZIDE 5 MG PO TABS: 5.0000 mg | ORAL_TABLET | Freq: Two times a day (BID) | ORAL | 1 refills | Status: DC

## 2019-11-01 MED ORDER — METHOCARBAMOL 500 MG PO TABS: 500.0000 mg | ORAL_TABLET | Freq: Three times a day (TID) | ORAL | 1 refills | Status: DC | PRN

## 2019-11-01 MED FILL — METHOCARBAMOL 500 MG TABS: 500 | 20 days supply | Qty: 60 | Fill #0

## 2019-11-01 MED FILL — glipiZIDE 5 MG TABS: 5 | 30 days supply | Qty: 60 | Fill #0
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
## 2019-11-01 NOTE — Progress Notes (Signed)
Pain in right leg and knee. Pain on right side.

## 2019-11-01 NOTE — Patient Instructions (Signed)

## 2019-11-02 ENCOUNTER — Encounter: Payer: Self-pay | Admitting: Family Medicine

## 2019-11-06 LAB — HEPATITIS C ANTIBODY (REFLEX): HCV Ab: 1.5 s/co ratio — ABNORMAL HIGH (ref 0.0–0.9)

## 2019-11-06 LAB — LIPID PANEL
Chol/HDL Ratio: 3.1 ratio (ref 0.0–4.4)
Cholesterol, Total: 129 mg/dL (ref 100–199)
HDL: 41 mg/dL (ref >39)
LDL Chol Calc (NIH): 77 mg/dL (ref 0–99)
Triglycerides: 48 mg/dL (ref 0–149)
VLDL Cholesterol Cal: 11 mg/dL (ref 5–40)

## 2019-11-06 LAB — HIV ANTIBODY (ROUTINE TESTING W REFLEX): HIV Screen 4th Generation wRfx: NONREACTIVE

## 2019-11-06 LAB — MICROALBUMIN / CREATININE URINE RATIO
Creatinine, Urine: 130.6 mg/dL
Microalb/Creat Ratio: 57 mg/g creat — ABNORMAL HIGH (ref 0–29)
Microalbumin, Urine: 75 ug/mL

## 2019-11-06 LAB — HCV AB VERIFICATION: HCV Antibody Verification: NONREACTIVE

## 2019-11-06 LAB — COMMENT4 - HEP PANEL

## 2019-11-15 ENCOUNTER — Other Ambulatory Visit: Payer: Self-pay

## 2019-11-15 ENCOUNTER — Emergency Department (HOSPITAL_COMMUNITY): Payer: Self-pay

## 2019-11-15 ENCOUNTER — Encounter (HOSPITAL_COMMUNITY): Payer: Self-pay | Admitting: Emergency Medicine

## 2019-11-15 ENCOUNTER — Emergency Department (HOSPITAL_COMMUNITY)
Admission: EM | Admit: 2019-11-15 | Discharge: 2019-11-15 | Disposition: A | Discharge status: Home / Self Care | Payer: Self-pay | Attending: Emergency Medicine | Admitting: Emergency Medicine

## 2019-11-15 DIAGNOSIS — Z79899 Other long term (current) drug therapy: Secondary | ICD-10-CM | POA: Insufficient documentation

## 2019-11-15 DIAGNOSIS — R197 Diarrhea, unspecified: Secondary | ICD-10-CM | POA: Insufficient documentation

## 2019-11-15 DIAGNOSIS — R110 Nausea: Secondary | ICD-10-CM | POA: Insufficient documentation

## 2019-11-15 DIAGNOSIS — Z7984 Long term (current) use of oral hypoglycemic drugs: Secondary | ICD-10-CM | POA: Insufficient documentation

## 2019-11-15 DIAGNOSIS — R1010 Upper abdominal pain, unspecified: Secondary | ICD-10-CM | POA: Insufficient documentation

## 2019-11-15 DIAGNOSIS — R1013 Epigastric pain: Secondary | ICD-10-CM | POA: Insufficient documentation

## 2019-11-15 DIAGNOSIS — I2510 Atherosclerotic heart disease of native coronary artery without angina pectoris: Secondary | ICD-10-CM | POA: Insufficient documentation

## 2019-11-15 DIAGNOSIS — Z955 Presence of coronary angioplasty implant and graft: Secondary | ICD-10-CM | POA: Insufficient documentation

## 2019-11-15 DIAGNOSIS — E119 Type 2 diabetes mellitus without complications: Secondary | ICD-10-CM | POA: Insufficient documentation

## 2019-11-15 DIAGNOSIS — I10 Essential (primary) hypertension: Secondary | ICD-10-CM | POA: Insufficient documentation

## 2019-11-15 LAB — URINALYSIS, ROUTINE W REFLEX MICROSCOPIC
Bacteria, UA: NONE SEEN
Bilirubin Urine: NEGATIVE
Glucose, UA: >=500 mg/dL — AB
Hgb urine dipstick: NEGATIVE
Ketones, ur: NEGATIVE mg/dL
Leukocytes,Ua: NEGATIVE
Nitrite: NEGATIVE
Protein, ur: NEGATIVE mg/dL
Specific Gravity, Urine: 1.023 (ref 1.005–1.030)
pH: 5 (ref 5.0–8.0)

## 2019-11-15 LAB — COMPREHENSIVE METABOLIC PANEL WITH GFR
ALT: 30 U/L (ref 0–44)
AST: 19 U/L (ref 15–41)
Albumin: 3.6 g/dL (ref 3.5–5.0)
Alkaline Phosphatase: 52 U/L (ref 38–126)
Anion gap: 9 (ref 5–15)
BUN: 12 mg/dL (ref 6–20)
CO2: 24 mmol/L (ref 22–32)
Calcium: 9 mg/dL (ref 8.9–10.3)
Chloride: 106 mmol/L (ref 98–111)
Creatinine, Ser: 0.62 mg/dL (ref 0.44–1.00)
GFR calc Af Amer: >60 mL/min
GFR calc non Af Amer: >60 mL/min
Glucose, Bld: 283 mg/dL — ABNORMAL HIGH (ref 70–99)
Potassium: 4 mmol/L (ref 3.5–5.1)
Sodium: 139 mmol/L (ref 135–145)
Total Bilirubin: 0.6 mg/dL (ref 0.3–1.2)
Total Protein: 6.5 g/dL (ref 6.5–8.1)

## 2019-11-15 LAB — LIPASE, BLOOD: Lipase: 56 U/L — ABNORMAL HIGH (ref 11–51)

## 2019-11-15 LAB — CBC
HCT: 38.9 % (ref 36.0–46.0)
Hemoglobin: 12.9 g/dL (ref 12.0–15.0)
MCH: 32 pg (ref 26.0–34.0)
MCHC: 33.2 g/dL (ref 30.0–36.0)
MCV: 96.5 fL (ref 80.0–100.0)
Platelets: 261 10*3/uL (ref 150–400)
RBC: 4.03 MIL/uL (ref 3.87–5.11)
RDW: 12.1 % (ref 11.5–15.5)
WBC: 7.9 10*3/uL (ref 4.0–10.5)
nRBC: 0 % (ref 0.0–0.2)

## 2019-11-15 LAB — TROPONIN I (HIGH SENSITIVITY)
Troponin I (High Sensitivity): 6 ng/L
Troponin I (High Sensitivity): 6 ng/L

## 2019-11-15 MED ORDER — IOHEXOL 300 MG/ML  SOLN
100.0000 mL | Freq: Once | INTRAMUSCULAR | Status: AC | PRN
  Administered 2019-11-15: 100 mL via INTRAVENOUS

## 2019-11-15 MED ORDER — SODIUM CHLORIDE 0.9% FLUSH
3.0000 mL | Freq: Once | INTRAVENOUS | Status: AC
  Administered 2019-11-15: 09:00:00 3 mL via INTRAVENOUS

## 2019-11-15 MED ORDER — ONDANSETRON 4 MG PO TBDP: 4.0000 mg | ORAL_TABLET | Freq: Three times a day (TID) | ORAL | 0 refills | Status: DC | PRN

## 2019-11-15 MED ORDER — OMEPRAZOLE 20 MG PO CPDR: 20.0000 mg | DELAYED_RELEASE_CAPSULE | Freq: Every day | ORAL | 0 refills | Status: DC

## 2019-11-15 NOTE — ED Triage Notes (Signed)
Patient arrived with EMS from home reports epigastric pain onset last night with nausea and diarrhea , history of CAD/Coronary stents , her cardiologist is Dr. Chrissie Noa at [facility]. No fever or chills .

## 2019-11-15 NOTE — ED Notes (Signed)
Pt is NSR on monitor 
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
## 2019-11-15 NOTE — ED Notes (Signed)
Transported to CT 

## 2019-11-15 NOTE — ED Notes (Signed)
Doctor at bedside.

## 2019-11-15 NOTE — Discharge Instructions (Addendum)
Your lipase was just mildly above normal.  Follow-up with your doctor for further evaluation of this.  Take the new medicines to help with the nausea and keep yourself hydrated.  Your CT scan did not show a clear cause of the pain.

## 2019-12-03 ENCOUNTER — Encounter (HOSPITAL_COMMUNITY): Payer: Self-pay

## 2019-12-03 ENCOUNTER — Ambulatory Visit (INDEPENDENT_AMBULATORY_CARE_PROVIDER_SITE_OTHER): Payer: Self-pay

## 2019-12-03 ENCOUNTER — Other Ambulatory Visit: Payer: Self-pay

## 2019-12-03 ENCOUNTER — Ambulatory Visit (HOSPITAL_COMMUNITY)
Admission: EM | Admit: 2019-12-03 | Discharge: 2019-12-03 | Disposition: A | Discharge status: Home / Self Care | Payer: Self-pay | Attending: Family Medicine | Admitting: Family Medicine

## 2019-12-03 DIAGNOSIS — B9689 Other specified bacterial agents as the cause of diseases classified elsewhere: Secondary | ICD-10-CM

## 2019-12-03 DIAGNOSIS — R059 Cough, unspecified: Secondary | ICD-10-CM

## 2019-12-03 DIAGNOSIS — R05 Cough: Secondary | ICD-10-CM

## 2019-12-03 DIAGNOSIS — I214 Non-ST elevation (NSTEMI) myocardial infarction: Secondary | ICD-10-CM

## 2019-12-03 DIAGNOSIS — R0789 Other chest pain: Secondary | ICD-10-CM

## 2019-12-03 DIAGNOSIS — R0602 Shortness of breath: Secondary | ICD-10-CM

## 2019-12-03 DIAGNOSIS — L089 Local infection of the skin and subcutaneous tissue, unspecified: Secondary | ICD-10-CM

## 2019-12-03 MED ORDER — MUPIROCIN 2 % EX OINT: 1.0000 "application " | TOPICAL_OINTMENT | Freq: Two times a day (BID) | CUTANEOUS | 0 refills | Status: DC

## 2019-12-03 MED ORDER — TICAGRELOR 90 MG PO TABS: 90.0000 mg | ORAL_TABLET | Freq: Two times a day (BID) | ORAL | 2 refills | Status: DC

## 2019-12-03 NOTE — ED Triage Notes (Signed)
Patient presents to Urgent Care with complaints of cough and "my lungs just hurt" since about a week ago. Patient reports she has not been taking her blood thinner that she was placed on because of a recent heart attack. She could not afford the medication.  Pt states it is the brillenta she has not been taking.

## 2019-12-03 NOTE — Discharge Instructions (Signed)
You have been seen at the [facility] today for shortness of breath and a cough. Your evaluation today was not suggestive of any emergent condition requiring medical intervention at this time. Your ECG (heart tracing) and chest x-ray did not show any worrisome changes. However, some medical problems make take more time to appear. Therefore, it's very important that you pay close attention to any new symptoms or worsening of your current condition.  Please proceed directly to the Emergency Department immediately should you feel worse in any way or have any of the following symptoms: worsening shortness of breath, chest pain, neck, jaw, back or abdomen, shortness of breath, or nausea and vomiting.

## 2019-12-05 NOTE — ED Provider Notes (Signed)
[facility]ARE [facility]ENTER   [id number] 12/03/19 Arrival Time: 1110  ASSESSMENT & PLAN:  1. Shortness of breath   2. Cough   3. NSTEMI (non-ST elevated myocardial infarction) (HCC)   4. Localized bacterial skin infection     See AVS for discharge instructions.  Given information on $5 Brilinta; encouraged her to begin again. Cost a limiting factor. No concern for cardiac related problem today. Discussed viral URI. I have personally viewed the imaging studies ordered this visit. No sign of PNA. Reassured.  No sign of abscess requiring I&D.   Meds ordered this encounter  Medications  . ticagrelor (BRILINTA) 90 MG TABS tablet    Sig: Take 1 tablet (90 mg total) by mouth 2 (two) times daily.    Dispense:  60 tablet    Refill:  2  . mupirocin ointment (BACTROBAN) 2 %    Sig: Apply 1 application topically 2 (two) times daily.    Dispense:  22 g    Refill:  0    May f/u with PCP or here as needed.  Reviewed expectations re: course of current medical issues. Questions answered. Outlined signs and symptoms indicating need for more acute intervention. Patient verbalized understanding. After Visit Summary given.   SUBJECTIVE: History from: patient.  [name] is a 54 y.o. female who presents with complaint of a dry persistent cough and occasional feelings of being short of breath. Onset abrupt, a week ago; with mild fatigue and without body aches. SOB: none currently and without associated CP when present. Wheezing: none. Fever: absent. Overall normal PO intake without n/v. Known sick contacts or COVID-19 exposure: no. No specific or significant aggravating or alleviating factors reported. OTC treatment: none reported.  Reports not taking Brilinta secondary to cost. NSTEMI 3-4 months ago.  Also reports "a small bump" on her R cheek; few days; no drainage or bleeding.   Social History   Tobacco Use  Smoking Status Never Smoker  Smokeless Tobacco Never Used      OBJECTIVE:  Vitals:   12/03/19 1145  BP: (!) 144/82  Pulse: 65  Resp: 16  Temp: 98.3 F (36.8 C)  TempSrc: Oral  SpO2: 99%     General appearance: alert; appears fatigued HEENT: nasal congestion; clear runny nose; throat irritation secondary to post-nasal drainage Neck: supple without LAD CV: RRR Lungs: unlabored respirations, symmetrical air entry without wheezing; cough: mild and dry Abd: soft Ext: no LE edema Skin: warm and dry; approx 1 cm area/induration on R cheek; erythematous; no fluctuance or drainage; is tender Psychological: alert and cooperative; normal mood and affect  Imaging: DG Chest 2 View  Result Date: 12/03/2019 CLINICAL DATA:  Left-sided chest pain. Cough. Recent myocardial infarct. EXAM: CHEST - 2 VIEW COMPARISON:  11/15/2019 FINDINGS: The heart size and mediastinal contours are within normal limits. Both lungs are clear. The visualized skeletal structures are unremarkable. IMPRESSION: No active cardiopulmonary disease. Electronically Signed   By: Danae Orleans M.D.   On: 12/03/2019 12:08    Allergies  Allergen Reactions  . Aspirin Anaphylaxis and Swelling    Tongue swells  . Tramadol Hives  . Hydrocodone Rash  . Ibuprofen Itching  . Morphine And Related Itching  . Orange Fruit [Citrus] Rash    Past Medical History:  Diagnosis Date  . Anxiety   . Diabetes mellitus   . Hypertension   . Sarcoidosis    Family History  Problem Relation Age of Onset  . Healthy Mother   . Cancer Maternal  Grandmother   . Diabetes Maternal Grandmother   . Heart disease Maternal Grandmother   . Tuberculosis Maternal Grandfather    Social History   Socioeconomic History  . Marital status: Legally Separated    Spouse name: Not on file  . Number of children: Not on file  . Years of education: Not on file  . Highest education level: Not on file  Occupational History  . Not on file  Tobacco Use  . Smoking status: Never Smoker  . Smokeless tobacco: Never  Used  Substance and Sexual Activity  . Alcohol use: Yes    Comment: occasional  . Drug use: No  . Sexual activity: Yes    Partners: Male    Birth control/protection: Surgical  Other Topics Concern  . Not on file  Social History Narrative  . Not on file   Social Determinants of Health   Financial Resource Strain:   . Difficulty of Paying Living Expenses: Not on file  Food Insecurity:   . Worried About Charity fundraiser in the Last Year: Not on file  . Ran Out of Food in the Last Year: Not on file  Transportation Needs:   . Lack of Transportation (Medical): Not on file  . Lack of Transportation (Non-Medical): Not on file  Physical Activity:   . Days of Exercise per Week: Not on file  . Minutes of Exercise per Session: Not on file  Stress:   . Feeling of Stress : Not on file  Social Connections:   . Frequency of Communication with Friends and Family: Not on file  . Frequency of Social Gatherings with Friends and Family: Not on file  . Attends Religious Services: Not on file  . Active Member of Clubs or Organizations: Not on file  . Attends Archivist Meetings: Not on file  . Marital Status: Not on file  Intimate Partner Violence:   . Fear of Current or Ex-Partner: Not on file  . Emotionally Abused: Not on file  . Physically Abused: Not on file  . Sexually Abused: Not on file           Vanessa Kick, MD 12/05/19 [id number]

## 2019-12-12 ENCOUNTER — Other Ambulatory Visit: Payer: Self-pay | Admitting: Physician Assistant

## 2019-12-12 MED FILL — METHOCARBAMOL 500 MG TABS: 500 | 20 days supply | Qty: 60 | Fill #1

## 2019-12-12 MED FILL — glipiZIDE 5 MG TABS: 5 | 30 days supply | Qty: 60 | Fill #1

## 2019-12-13 MED FILL — metFORMIN HCL 1000 MG TABS: 1000 | 30 days supply | Qty: 60 | Fill #0

## 2019-12-21 ENCOUNTER — Encounter (HOSPITAL_COMMUNITY): Payer: Self-pay | Admitting: Emergency Medicine

## 2019-12-21 ENCOUNTER — Other Ambulatory Visit: Payer: Self-pay

## 2019-12-21 ENCOUNTER — Ambulatory Visit (HOSPITAL_COMMUNITY)
Admission: EM | Admit: 2019-12-21 | Discharge: 2019-12-21 | Disposition: A | Discharge status: Home / Self Care | Payer: HRSA Program | Attending: Family Medicine | Admitting: Family Medicine

## 2019-12-21 DIAGNOSIS — R05 Cough: Secondary | ICD-10-CM | POA: Diagnosis present

## 2019-12-21 DIAGNOSIS — Z20822 Contact with and (suspected) exposure to covid-19: Secondary | ICD-10-CM | POA: Insufficient documentation

## 2019-12-21 DIAGNOSIS — E119 Type 2 diabetes mellitus without complications: Secondary | ICD-10-CM | POA: Insufficient documentation

## 2019-12-21 DIAGNOSIS — Z833 Family history of diabetes mellitus: Secondary | ICD-10-CM | POA: Insufficient documentation

## 2019-12-21 DIAGNOSIS — R059 Cough, unspecified: Secondary | ICD-10-CM

## 2019-12-21 DIAGNOSIS — R030 Elevated blood-pressure reading, without diagnosis of hypertension: Secondary | ICD-10-CM | POA: Insufficient documentation

## 2019-12-21 DIAGNOSIS — Z886 Allergy status to analgesic agent status: Secondary | ICD-10-CM | POA: Insufficient documentation

## 2019-12-21 DIAGNOSIS — Z885 Allergy status to narcotic agent status: Secondary | ICD-10-CM | POA: Insufficient documentation

## 2019-12-21 DIAGNOSIS — I10 Essential (primary) hypertension: Secondary | ICD-10-CM

## 2019-12-21 DIAGNOSIS — Z8249 Family history of ischemic heart disease and other diseases of the circulatory system: Secondary | ICD-10-CM | POA: Insufficient documentation

## 2019-12-21 DIAGNOSIS — I2510 Atherosclerotic heart disease of native coronary artery without angina pectoris: Secondary | ICD-10-CM | POA: Insufficient documentation

## 2019-12-21 HISTORY — DX: Atherosclerotic heart disease of native coronary artery without angina pectoris: I25.10

## 2019-12-21 MED ORDER — BENZONATATE 100 MG PO CAPS: ORAL_CAPSULE | ORAL | 0 refills | Status: DC

## 2019-12-21 NOTE — Discharge Instructions (Addendum)
Your blood pressure was noted to be elevated during your visit today. You may return here within the next few days to recheck if unable to see your primary care doctor. If your blood pressure remains persistently elevated, you may need to increase your current blood pressure medication.  BP (!) 190/90 (BP Location: Right Arm)   Pulse 71   Temp 98.4 F (36.9 C) (Oral)   Resp 18   LMP 01/24/2015 (Approximate)   SpO2 96%   You have been tested for COVID-19 today. If your test returns positive, you will receive a phone call from [facility] regarding your results. Negative test results are not called. Both positive and negative results area always visible on MyChart. If you do not have a MyChart account, sign up instructions are provided in your discharge papers. Please do not hesitate to contact us should you have questions or concerns.

## 2019-12-21 NOTE — ED Triage Notes (Signed)
Pt here for cough x 3 days and pain with cough

## 2019-12-22 NOTE — ED Provider Notes (Signed)
[facility]   [id number] 12/21/19 Arrival Time: 1759  ASSESSMENT & PLAN:  1. Cough   2. Elevated blood pressure reading in office with diagnosis of hypertension     Discussed likely viral illness. COVID testing sent. Work note given; see letter in file.  Meds ordered this encounter  Medications  . benzonatate (TESSALON) 100 MG capsule    Sig: Take 1 capsule by mouth every 8 (eight) hours for cough.    Dispense:  21 capsule    Refill:  0   OTC symptom care as needed. Ensure adequate fluid intake and rest.    Discharge Instructions     Your blood pressure was noted to be elevated during your visit today. You may return here within the next few days to recheck if unable to see your primary care doctor. If your blood pressure remains persistently elevated, you may need to increase your current blood pressure medication.  BP (!) 190/90 (BP Location: Right Arm)   Pulse 71   Temp 98.4 F (36.9 C) (Oral)   Resp 18   LMP 01/24/2015 (Approximate)   SpO2 96%   You have been tested for COVID-19 today. If your test returns positive, you will receive a phone call from [facility] regarding your results. Negative test results are not called. Both positive and negative results area always visible on MyChart. If you do not have a MyChart account, sign up instructions are provided in your discharge papers. Please do not hesitate to contact us should you have questions or concerns.     No symptoms of hypertensive urgency.  Follow-up Information    Schedule an appointment as soon as possible for a visit  with Hoy Register, MD.   Specialty: Family Medicine Why: To recheck your blood pressure. Contact information: [address] [place] [address] [phone]           May f/u here as needed. Reviewed expectations re: course of current medical issues. Questions answered. Outlined signs and symptoms indicating need for more acute  intervention. Patient verbalized understanding. After Visit Summary given.   SUBJECTIVE: History from: patient. [name] is a 54 y.o. female who presents with complaint of "just feeling really bad" over the past three days. Fairly abrupt onset of body aches and a non-productive cough. No SOB. Afebrile. "Feel wiped out'. Normal PO intake without n/v. Cough does affect sleep. No wheezing.  Social History   Tobacco Use  Smoking Status Never Smoker  Smokeless Tobacco Never Used   No known sick/COVID contacts.  Increased blood pressure noted today. Reports that she is treated for HTN. She reports taking medications as instructed, no chest pain on exertion, swelling of ankles, no orthostatic dizziness or lightheadedness, no orthopnea or paroxysmal nocturnal dyspnea, no palpitations and no intermittent claudication symptoms.   OBJECTIVE:  Vitals:   12/21/19 1849  BP: (!) 190/90  Pulse: 71  Resp: 18  Temp: 98.4 F (36.9 C)  TempSrc: Oral  SpO2: 96%     General appearance: alert; appears fatigued HEENT: nasal congestion; clear runny nose; throat irritation secondary to post-nasal drainage Neck: supple without LAD CV: RRR Lungs: unlabored respirations, symmetrical air entry without wheezing; cough: moderate and dry; speaks full sentences without difficulty; CTAB Abd: soft Ext: no LE edema Skin: warm and dry Psychological: alert and cooperative; normal mood and affect   Allergies  Allergen Reactions  . Aspirin Anaphylaxis and Swelling    Tongue swells  . Tramadol Hives  . Hydrocodone  Rash  . Ibuprofen Itching  . Morphine And Related Itching  . Orange Fruit [Citrus] Rash    Past Medical History:  Diagnosis Date  . Anxiety   . Coronary artery disease   . Diabetes mellitus   . Hypertension   . Sarcoidosis    Family History  Problem Relation Age of Onset  . Healthy Mother   . Cancer Maternal Grandmother   . Diabetes Maternal Grandmother   . Heart disease  Maternal Grandmother   . Tuberculosis Maternal Grandfather    Social History   Socioeconomic History  . Marital status: Legally Separated    Spouse name: Not on file  . Number of children: Not on file  . Years of education: Not on file  . Highest education level: Not on file  Occupational History  . Not on file  Tobacco Use  . Smoking status: Never Smoker  . Smokeless tobacco: Never Used  Substance and Sexual Activity  . Alcohol use: Yes    Comment: occasional  . Drug use: No  . Sexual activity: Yes    Partners: Male    Birth control/protection: Surgical  Other Topics Concern  . Not on file  Social History Narrative  . Not on file   Social Determinants of Health   Financial Resource Strain:   . Difficulty of Paying Living Expenses: Not on file  Food Insecurity:   . Worried About Charity fundraiser in the Last Year: Not on file  . Ran Out of Food in the Last Year: Not on file  Transportation Needs:   . Lack of Transportation (Medical): Not on file  . Lack of Transportation (Non-Medical): Not on file  Physical Activity:   . Days of Exercise per Week: Not on file  . Minutes of Exercise per Session: Not on file  Stress:   . Feeling of Stress : Not on file  Social Connections:   . Frequency of Communication with Friends and Family: Not on file  . Frequency of Social Gatherings with Friends and Family: Not on file  . Attends Religious Services: Not on file  . Active Member of Clubs or Organizations: Not on file  . Attends Archivist Meetings: Not on file  . Marital Status: Not on file  Intimate Partner Violence:   . Fear of Current or Ex-Partner: Not on file  . Emotionally Abused: Not on file  . Physically Abused: Not on file  . Sexually Abused: Not on file           Vanessa Kick, MD 12/22/19 1139

## 2019-12-24 LAB — NOVEL CORONAVIRUS, NAA (HOSP ORDER, SEND-OUT TO REF LAB; TAT 18-24 HRS): SARS-CoV-2, NAA: NOT DETECTED

## 2020-01-14 ENCOUNTER — Other Ambulatory Visit: Payer: Self-pay

## 2020-01-14 ENCOUNTER — Ambulatory Visit (HOSPITAL_COMMUNITY)
Admission: EM | Admit: 2020-01-14 | Discharge: 2020-01-14 | Disposition: A | Discharge status: Home / Self Care | Payer: Self-pay | Attending: Emergency Medicine | Admitting: Emergency Medicine

## 2020-01-14 ENCOUNTER — Encounter (HOSPITAL_COMMUNITY): Payer: Self-pay | Admitting: Emergency Medicine

## 2020-01-14 DIAGNOSIS — I10 Essential (primary) hypertension: Secondary | ICD-10-CM

## 2020-01-14 DIAGNOSIS — R030 Elevated blood-pressure reading, without diagnosis of hypertension: Secondary | ICD-10-CM

## 2020-01-14 DIAGNOSIS — R519 Headache, unspecified: Secondary | ICD-10-CM

## 2020-01-14 DIAGNOSIS — I252 Old myocardial infarction: Secondary | ICD-10-CM

## 2020-01-14 NOTE — Discharge Instructions (Addendum)
Increase your lisinopril to 20mg  once daily. That would mean you take 2 tablets of your 10mg  dose. Contact your heart doctor and get a follow up appointment with him/her asap for a recheck on your blood pressure.   For your diabetes, please make sure you are avoiding starchy, carbohydrate foods like pasta, breads, pastry, rice, potatoes, desserts. These foods can elevated your blood sugar. Also, limit your alcohol drinking to 1 per day, avoid sodas, sweet teas. For elevated blood pressure, make sure you are monitoring salt in your diet.  Do not eat restaurant foods and limit processed foods at home.  Processed foods include things like frozen meals preseason meats and dinners.  Make sure your pain attention to sodium labels on foods you by at the grocery store.  For seasoning you can use a brand called Mrs. Dash which includes a lot of salt free seasonings.  Salads - kale, spinach, cabbage, spring mix; use seeds like pumpkin seeds or sunflower seeds, almonds; you can also use 1-2 hard boiled eggs in your salads Fruits - avocadoes, berries (blueberries, raspberries, blackberries), apples, oranges Vegetables - aspargus, cauliflower, broccoli, green beans, brussel spouts, bell peppers; stay away from starchy vegetables like potatoes, carrots, peas  Regarding meat it is better to eat lean meats and limit your red meat consumption including pork.  Wild caught fish, chicken breast are good options.  Do not eat any foods on this list that you are allergic to.
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
## 2020-01-14 NOTE — ED Triage Notes (Addendum)
Patient is concerned about blood pressure readings.  Reports ems came to her location at 3 am-reports bp 200/110.  Patient denies pain  Patient called ems because she felt hot like when she had a heart attack

## 2020-01-24 ENCOUNTER — Ambulatory Visit (HOSPITAL_COMMUNITY)
Admission: EM | Admit: 2020-01-24 | Discharge: 2020-01-24 | Disposition: A | Discharge status: Home / Self Care | Payer: Self-pay | Attending: Family Medicine | Admitting: Family Medicine

## 2020-01-24 ENCOUNTER — Encounter (HOSPITAL_COMMUNITY): Payer: Self-pay

## 2020-01-24 ENCOUNTER — Ambulatory Visit: Payer: Self-pay | Admitting: Family Medicine

## 2020-01-24 ENCOUNTER — Other Ambulatory Visit: Payer: Self-pay

## 2020-01-24 DIAGNOSIS — S61512A Laceration without foreign body of left wrist, initial encounter: Secondary | ICD-10-CM

## 2020-01-24 NOTE — ED Triage Notes (Signed)
Pt presents for left wrist laceration from a pan at work this evening.

## 2020-01-25 NOTE — ED Provider Notes (Signed)
[facility]   [id number] 01/24/20 Arrival Time: 1731  ASSESSMENT & PLAN:  1. Laceration of left wrist, initial encounter     No indication for suturing. No bleeding. Work papers filled out. Normal duties as long as she keeps wrist bandaged and clean and dry. Tylenol if needed.  May f/u here if needed.  Reviewed expectations re: course of current medical issues. Questions answered. Outlined signs and symptoms indicating need for more acute intervention. Patient verbalized understanding. After Visit Summary given.   SUBJECTIVE:  [name] is a 54 y.o. female who presents with a laceration of her volar wrist; cut on pan at work; initial significant bleeding that was quickly controlled; wound washed. Minimal discomfort. No extremity sensation changes or weakness.  Feels imm are UTD.   Health Maintenance Due  Topic Date Due  . FOOT EXAM  Never done  . OPHTHALMOLOGY EXAM  Never done  . TETANUS/TDAP  Never done  . COLONOSCOPY  Never done  . MAMMOGRAM  04/21/2015  . PAP SMEAR-Modifier  08/29/2016    OBJECTIVE:  Vitals:   01/24/20 1806  BP: (!) 191/92  Pulse: 88  Resp: 16  Temp: 98.2 F (36.8 C)  TempSrc: Oral  SpO2: 99%     General appearance: alert; no distress Skin: linear laceration of L volar wrist; superficial; size: approx 1 cm; clean wound edges, no foreign bodies; without active bleeding; wrist with FROM and normal distal sensation and cap refill Psychological: alert and cooperative; normal mood and affect    Labs Reviewed - No data to display  No results found.  Allergies  Allergen Reactions  . Aspirin Anaphylaxis and Swelling    Tongue swells  . Tramadol Hives  . Hydrocodone Rash  . Ibuprofen Itching  . Morphine And Related Itching  . Orange Fruit [Citrus] Rash    Past Medical History:  Diagnosis Date  . Anxiety   . Coronary artery disease   . Diabetes mellitus   . Hypertension   . Sarcoidosis    Social History    Socioeconomic History  . Marital status: Legally Separated    Spouse name: Not on file  . Number of children: Not on file  . Years of education: Not on file  . Highest education level: Not on file  Occupational History  . Not on file  Tobacco Use  . Smoking status: Never Smoker  . Smokeless tobacco: Never Used  Substance and Sexual Activity  . Alcohol use: Yes    Comment: occasional  . Drug use: No  . Sexual activity: Yes    Partners: Male    Birth control/protection: Surgical  Other Topics Concern  . Not on file  Social History Narrative  . Not on file   Social Determinants of Health   Financial Resource Strain:   . Difficulty of Paying Living Expenses:   Food Insecurity:   . Worried About Programme researcher, broadcasting/film/video in the Last Year:   . Barista in the Last Year:   Transportation Needs:   . Freight forwarder (Medical):   Marland Kitchen Lack of Transportation (Non-Medical):   Physical Activity:   . Days of Exercise per Week:   . Minutes of Exercise per Session:   Stress:   . Feeling of Stress :   Social Connections:   . Frequency of Communication with Friends and Family:   . Frequency of Social Gatherings with Friends and Family:   . Attends Religious Services:   . Active  Member of Clubs or Organizations:   . Attends Archivist Meetings:   Marland Kitchen Marital Status:          Vanessa Kick, MD 01/25/20 [phone]

## 2020-01-31 ENCOUNTER — Ambulatory Visit: Payer: Self-pay | Admitting: Family Medicine

## 2020-02-04 ENCOUNTER — Other Ambulatory Visit: Payer: Self-pay

## 2020-02-04 ENCOUNTER — Ambulatory Visit (HOSPITAL_COMMUNITY)
Admission: EM | Admit: 2020-02-04 | Discharge: 2020-02-04 | Disposition: A | Discharge status: Home / Self Care | Payer: Self-pay

## 2020-02-04 ENCOUNTER — Encounter (HOSPITAL_COMMUNITY): Payer: Self-pay

## 2020-02-04 DIAGNOSIS — M25551 Pain in right hip: Secondary | ICD-10-CM | POA: Insufficient documentation

## 2020-02-04 DIAGNOSIS — M25552 Pain in left hip: Secondary | ICD-10-CM | POA: Insufficient documentation

## 2020-02-04 DIAGNOSIS — F419 Anxiety disorder, unspecified: Secondary | ICD-10-CM | POA: Insufficient documentation

## 2020-02-04 DIAGNOSIS — I214 Non-ST elevation (NSTEMI) myocardial infarction: Secondary | ICD-10-CM | POA: Insufficient documentation

## 2020-02-04 DIAGNOSIS — R079 Chest pain, unspecified: Secondary | ICD-10-CM | POA: Insufficient documentation

## 2020-02-04 DIAGNOSIS — D869 Sarcoidosis, unspecified: Secondary | ICD-10-CM | POA: Insufficient documentation

## 2020-02-04 DIAGNOSIS — I10 Essential (primary) hypertension: Secondary | ICD-10-CM | POA: Insufficient documentation

## 2020-02-04 DIAGNOSIS — R81 Glycosuria: Secondary | ICD-10-CM | POA: Insufficient documentation

## 2020-02-04 DIAGNOSIS — E1159 Type 2 diabetes mellitus with other circulatory complications: Secondary | ICD-10-CM | POA: Insufficient documentation

## 2020-02-04 DIAGNOSIS — E1169 Type 2 diabetes mellitus with other specified complication: Secondary | ICD-10-CM | POA: Insufficient documentation

## 2020-02-04 LAB — POCT URINALYSIS DIP (DEVICE)
Bilirubin Urine: NEGATIVE
Glucose, UA: >=1000 mg/dL — AB
Hgb urine dipstick: NEGATIVE
Ketones, ur: NEGATIVE mg/dL
Leukocytes,Ua: NEGATIVE
Nitrite: NEGATIVE
Protein, ur: NEGATIVE mg/dL
Specific Gravity, Urine: 1.025 (ref 1.005–1.030)
Urobilinogen, UA: 0.2 mg/dL (ref 0.0–1.0)
pH: 5.5 (ref 5.0–8.0)

## 2020-02-04 MED ORDER — METHOCARBAMOL 500 MG PO TABS: 500.0000 mg | ORAL_TABLET | Freq: Three times a day (TID) | ORAL | 1 refills | Status: DC | PRN

## 2020-02-04 MED ORDER — GLIPIZIDE 5 MG PO TABS: 5.0000 mg | ORAL_TABLET | Freq: Two times a day (BID) | ORAL | 1 refills | Status: DC

## 2020-02-04 MED ORDER — TICAGRELOR 90 MG PO TABS: 90.0000 mg | ORAL_TABLET | Freq: Two times a day (BID) | ORAL | 2 refills | Status: DC

## 2020-02-04 MED ORDER — LISINOPRIL 10 MG PO TABS: 10.0000 mg | ORAL_TABLET | Freq: Every day | ORAL | 0 refills | Status: DC

## 2020-02-04 NOTE — Discharge Instructions (Addendum)
EKG today very similar to your last EKG  I have sent in refills of your medications for you today. Continue to take them as prescribed.  Follow up with [facility] as needed.   Go to the ER for trouble swallowing, trouble breathing, chest pain not relieved by rest, other concerning symptoms.
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
## 2020-02-04 NOTE — ED Triage Notes (Signed)
Pt is here with hypertension and on & off chest pain since this morning.

## 2020-02-05 LAB — URINE CULTURE: Culture: <10000 — AB

## 2020-02-08 ENCOUNTER — Other Ambulatory Visit: Payer: Self-pay | Admitting: Family Medicine

## 2020-02-08 DIAGNOSIS — M25551 Pain in right hip: Secondary | ICD-10-CM

## 2020-02-15 ENCOUNTER — Ambulatory Visit: Payer: Self-pay | Attending: Family Medicine | Admitting: Family Medicine

## 2020-02-15 ENCOUNTER — Encounter: Payer: Self-pay | Admitting: Family Medicine

## 2020-02-15 ENCOUNTER — Other Ambulatory Visit: Payer: Self-pay

## 2020-02-15 VITALS — BP 128/76 | HR 61 | Wt 206.0 lb

## 2020-02-15 DIAGNOSIS — R5383 Other fatigue: Secondary | ICD-10-CM

## 2020-02-15 DIAGNOSIS — R29818 Other symptoms and signs involving the nervous system: Secondary | ICD-10-CM

## 2020-02-15 DIAGNOSIS — I10 Essential (primary) hypertension: Secondary | ICD-10-CM

## 2020-02-15 DIAGNOSIS — I214 Non-ST elevation (NSTEMI) myocardial infarction: Secondary | ICD-10-CM

## 2020-02-15 DIAGNOSIS — E1159 Type 2 diabetes mellitus with other circulatory complications: Secondary | ICD-10-CM

## 2020-02-15 LAB — GLUCOSE, POCT (MANUAL RESULT ENTRY): POC Glucose: 274 mg/dl — AB (ref 70–99)

## 2020-02-15 LAB — POCT GLYCOSYLATED HEMOGLOBIN (HGB A1C): HbA1c, POC (controlled diabetic range): 9.9 % — AB (ref 0.0–7.0)

## 2020-02-15 MED ORDER — ATORVASTATIN CALCIUM 80 MG PO TABS: 80.0000 mg | ORAL_TABLET | Freq: Every day | ORAL | 6 refills | Status: DC

## 2020-02-15 MED ORDER — LISINOPRIL 10 MG PO TABS: 10.0000 mg | ORAL_TABLET | Freq: Every day | ORAL | 6 refills | Status: DC

## 2020-02-15 MED ORDER — GLIPIZIDE 10 MG PO TABS: 10.0000 mg | ORAL_TABLET | Freq: Two times a day (BID) | ORAL | 6 refills | Status: DC

## 2020-02-15 MED ORDER — DICLOFENAC SODIUM 1 % EX GEL: 4.0000 g | Freq: Four times a day (QID) | CUTANEOUS | 1 refills | Status: DC

## 2020-02-15 MED ORDER — TICAGRELOR 90 MG PO TABS: 90.0000 mg | ORAL_TABLET | Freq: Two times a day (BID) | ORAL | 6 refills | Status: AC

## 2020-02-15 MED FILL — ATORVASTATIN 80 MG TABLET: 80 | 90 days supply | Qty: 90 | Fill #0

## 2020-02-15 MED FILL — DICLOFENAC SODIUM 1 % GEL: 1 | 6 days supply | Qty: 100 | Fill #0

## 2020-02-15 MED FILL — glipiZIDE 10 MG TABS: 10 | 90 days supply | Qty: 180 | Fill #0

## 2020-02-15 MED FILL — !BRILINTA 90 MG TABLET: 30 days supply | Qty: 60 | Fill #0

## 2020-02-15 MED FILL — LISINOPRIL 10 MG TABS: 10 | 90 days supply | Qty: 90 | Fill #0

## 2020-02-15 NOTE — Progress Notes (Signed)
Pain in arms and hands. Tired all the time.

## 2020-02-15 NOTE — Patient Instructions (Signed)
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
## 2020-02-16 ENCOUNTER — Other Ambulatory Visit: Payer: Self-pay | Admitting: Family Medicine

## 2020-02-16 LAB — CMP14+EGFR
ALT: 22 IU/L (ref 0–32)
AST: 13 IU/L (ref 0–40)
Albumin/Globulin Ratio: 1.6 (ref 1.2–2.2)
Albumin: 4.1 g/dL (ref 3.8–4.9)
Alkaline Phosphatase: 74 IU/L (ref 39–117)
BUN/Creatinine Ratio: 21 (ref 9–23)
BUN: 14 mg/dL (ref 6–24)
Bilirubin Total: 0.3 mg/dL (ref 0.0–1.2)
CO2: 24 mmol/L (ref 20–29)
Calcium: 9.4 mg/dL (ref 8.7–10.2)
Chloride: 102 mmol/L (ref 96–106)
Creatinine, Ser: 0.67 mg/dL (ref 0.57–1.00)
GFR calc Af Amer: 114 mL/min/{1.73_m2} (ref >59)
GFR calc non Af Amer: 99 mL/min/{1.73_m2} (ref >59)
Globulin, Total: 2.6 g/dL (ref 1.5–4.5)
Glucose: 311 mg/dL — ABNORMAL HIGH (ref 65–99)
Potassium: 4.6 mmol/L (ref 3.5–5.2)
Sodium: 138 mmol/L (ref 134–144)
Total Protein: 6.7 g/dL (ref 6.0–8.5)

## 2020-02-16 LAB — LIPID PANEL
Chol/HDL Ratio: 4 ratio (ref 0.0–4.4)
Cholesterol, Total: 174 mg/dL (ref 100–199)
HDL: 44 mg/dL (ref >39)
LDL Chol Calc (NIH): 114 mg/dL — ABNORMAL HIGH (ref 0–99)
Triglycerides: 86 mg/dL (ref 0–149)
VLDL Cholesterol Cal: 16 mg/dL (ref 5–40)

## 2020-02-16 LAB — VITAMIN D 25 HYDROXY (VIT D DEFICIENCY, FRACTURES): Vit D, 25-Hydroxy: 18.6 ng/mL — ABNORMAL LOW (ref 30.0–100.0)

## 2020-02-16 LAB — T4, FREE: Free T4: 1.38 ng/dL (ref 0.82–1.77)

## 2020-02-16 LAB — TSH: TSH: 0.533 u[IU]/mL (ref 0.450–4.500)

## 2020-02-16 LAB — MICROALBUMIN / CREATININE URINE RATIO
Creatinine, Urine: 91.8 mg/dL
Microalb/Creat Ratio: 24 mg/g creat (ref 0–29)
Microalbumin, Urine: 22.1 ug/mL

## 2020-02-16 MED ORDER — ERGOCALCIFEROL 1.25 MG (50000 UT) PO CAPS: 50000.0000 [IU] | ORAL_CAPSULE | ORAL | 0 refills | Status: DC

## 2020-02-16 MED FILL — VIT D2 1.25 MG (50,000 UNIT: 1.25 MG | 84 days supply | Qty: 12 | Fill #0

## 2020-02-17 ENCOUNTER — Telehealth: Payer: Self-pay

## 2020-02-17 NOTE — Telephone Encounter (Signed)
Phone number on file is not working at this time.

## 2020-02-17 NOTE — Telephone Encounter (Signed)
-----   Message from Enobong Newlin, MD sent at 02/17/2020  8:58 AM EDT ----- Can you please call her with a MyChart message regarding her results?  I am not sure she has seen this.  Thank you 

## 2020-02-23 ENCOUNTER — Telehealth: Payer: Self-pay

## 2020-02-23 NOTE — Telephone Encounter (Signed)
-----   Message from Hoy Register, MD sent at 02/17/2020  8:58 AM EDT ----- Can you please call her with a MyChart message regarding her results?  I am not sure she has seen this.  Thank you

## 2020-02-23 NOTE — Telephone Encounter (Signed)
Patient was called at number on file and call can not be completed at this time.  Letter has been mailed out to patient.

## 2020-03-02 ENCOUNTER — Telehealth: Payer: Self-pay | Admitting: Family Medicine

## 2020-03-02 MED FILL — glipiZIDE 10 MG TABS: 10 | 90 days supply | Qty: 180 | Fill #0

## 2020-03-02 MED FILL — !BRILINTA 90 MG TABLET: 30 days supply | Qty: 60 | Fill #0

## 2020-03-02 MED FILL — LISINOPRIL 10 MG TABS: 10 | 90 days supply | Qty: 90 | Fill #0

## 2020-03-02 MED FILL — ATORVASTATIN 80 MG TABLET: 80 | 90 days supply | Qty: 90 | Fill #0

## 2020-03-02 MED FILL — DICLOFENAC SODIUM 1 % GEL: 1 | 6 days supply | Qty: 100 | Fill #0

## 2020-03-02 MED FILL — VIT D2 1.25 MG (50,000 UNIT: 1.25 MG | 84 days supply | Qty: 12 | Fill #0

## 2020-03-02 NOTE — Telephone Encounter (Signed)
Patient called requesting her lab results. Please f/u  

## 2020-03-02 NOTE — Telephone Encounter (Signed)
Patient was called and it states that call can not be completed at this time.

## 2020-03-05 ENCOUNTER — Other Ambulatory Visit: Payer: Self-pay

## 2020-03-05 ENCOUNTER — Ambulatory Visit (INDEPENDENT_AMBULATORY_CARE_PROVIDER_SITE_OTHER): Payer: HRSA Program

## 2020-03-05 ENCOUNTER — Encounter (HOSPITAL_COMMUNITY): Payer: Self-pay

## 2020-03-05 ENCOUNTER — Ambulatory Visit (HOSPITAL_COMMUNITY)
Admission: EM | Admit: 2020-03-05 | Discharge: 2020-03-05 | Disposition: A | Discharge status: Home / Self Care | Payer: HRSA Program | Attending: Family Medicine | Admitting: Family Medicine

## 2020-03-05 DIAGNOSIS — M549 Dorsalgia, unspecified: Secondary | ICD-10-CM | POA: Diagnosis not present

## 2020-03-05 DIAGNOSIS — Z888 Allergy status to other drugs, medicaments and biological substances status: Secondary | ICD-10-CM | POA: Insufficient documentation

## 2020-03-05 DIAGNOSIS — Z79899 Other long term (current) drug therapy: Secondary | ICD-10-CM | POA: Diagnosis not present

## 2020-03-05 DIAGNOSIS — I2510 Atherosclerotic heart disease of native coronary artery without angina pectoris: Secondary | ICD-10-CM | POA: Insufficient documentation

## 2020-03-05 DIAGNOSIS — Z20822 Contact with and (suspected) exposure to covid-19: Secondary | ICD-10-CM | POA: Diagnosis not present

## 2020-03-05 DIAGNOSIS — I10 Essential (primary) hypertension: Secondary | ICD-10-CM | POA: Insufficient documentation

## 2020-03-05 DIAGNOSIS — Z7984 Long term (current) use of oral hypoglycemic drugs: Secondary | ICD-10-CM | POA: Insufficient documentation

## 2020-03-05 DIAGNOSIS — Z833 Family history of diabetes mellitus: Secondary | ICD-10-CM | POA: Insufficient documentation

## 2020-03-05 DIAGNOSIS — Z8249 Family history of ischemic heart disease and other diseases of the circulatory system: Secondary | ICD-10-CM | POA: Diagnosis not present

## 2020-03-05 DIAGNOSIS — R05 Cough: Secondary | ICD-10-CM | POA: Insufficient documentation

## 2020-03-05 DIAGNOSIS — Z885 Allergy status to narcotic agent status: Secondary | ICD-10-CM | POA: Insufficient documentation

## 2020-03-05 DIAGNOSIS — R5381 Other malaise: Secondary | ICD-10-CM | POA: Insufficient documentation

## 2020-03-05 DIAGNOSIS — E1140 Type 2 diabetes mellitus with diabetic neuropathy, unspecified: Secondary | ICD-10-CM | POA: Diagnosis not present

## 2020-03-05 DIAGNOSIS — Z886 Allergy status to analgesic agent status: Secondary | ICD-10-CM | POA: Diagnosis not present

## 2020-03-05 DIAGNOSIS — Z9861 Coronary angioplasty status: Secondary | ICD-10-CM | POA: Insufficient documentation

## 2020-03-05 DIAGNOSIS — R059 Cough, unspecified: Secondary | ICD-10-CM

## 2020-03-05 NOTE — ED Triage Notes (Signed)
Pt presents to UC with nasal congestion, cough, headaches and body aches x 3 days.
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
## 2020-03-05 NOTE — Discharge Instructions (Signed)
Please try honey, vick's vapor rub, lozenges and humidifer for cough  Please follow up if your symptoms fail to improve.

## 2020-03-06 LAB — SARS CORONAVIRUS 2 (TAT 6-24 HRS): SARS Coronavirus 2: NEGATIVE

## 2020-03-08 ENCOUNTER — Other Ambulatory Visit: Payer: Self-pay

## 2020-03-08 ENCOUNTER — Encounter: Payer: Self-pay | Admitting: General Practice

## 2020-03-08 ENCOUNTER — Ambulatory Visit (HOSPITAL_COMMUNITY)
Admission: EM | Admit: 2020-03-08 | Discharge: 2020-03-08 | Disposition: A | Discharge status: Home / Self Care | Payer: Self-pay | Attending: Emergency Medicine | Admitting: Emergency Medicine

## 2020-03-08 DIAGNOSIS — J209 Acute bronchitis, unspecified: Secondary | ICD-10-CM

## 2020-03-08 MED ORDER — ONDANSETRON 4 MG PO TBDP
4.0000 mg | ORAL_TABLET | Freq: Once | ORAL | Status: AC
  Administered 2020-03-08: 4 mg via ORAL

## 2020-03-08 MED ORDER — DOXYCYCLINE HYCLATE 100 MG PO CAPS: 100.0000 mg | ORAL_CAPSULE | Freq: Two times a day (BID) | ORAL | 0 refills | Status: AC

## 2020-03-08 MED ORDER — PROMETHAZINE-DM 6.25-15 MG/5ML PO SYRP: 5.0000 mL | ORAL_SOLUTION | Freq: Four times a day (QID) | ORAL | 0 refills | Status: DC | PRN

## 2020-03-08 MED ORDER — ACYCLOVIR 5 % EX OINT
1.0000 | TOPICAL_OINTMENT | CUTANEOUS | 0 refills | Status: DC
Start: 2020-03-08 — End: 2020-05-23

## 2020-03-08 MED ORDER — ONDANSETRON 4 MG PO TBDP
4.0000 mg | ORAL_TABLET | Freq: Three times a day (TID) | ORAL | 0 refills | Status: DC | PRN
Start: 2020-03-08 — End: 2020-05-23

## 2020-03-08 MED ORDER — BENZONATATE 200 MG PO CAPS: 200.0000 mg | ORAL_CAPSULE | Freq: Three times a day (TID) | ORAL | 0 refills | Status: AC | PRN

## 2020-03-08 MED ORDER — ONDANSETRON 4 MG PO TBDP
ORAL_TABLET | ORAL | Status: AC
  Filled 2020-03-08: qty 1

## 2020-03-08 MED ORDER — ALBUTEROL SULFATE HFA 108 (90 BASE) MCG/ACT IN AERS: 1.0000 | INHALATION_SPRAY | Freq: Four times a day (QID) | RESPIRATORY_TRACT | 0 refills | Status: DC | PRN
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
## 2020-03-08 NOTE — ED Triage Notes (Signed)
Pt was evaluated and tx here on 05/03 for cough and general malaise. Pt reports n/v onset last night with x3 emesis occurrences and small amount of blood reported in emesis. Pt reports continued/worsening cough. Denies fever, chills, or dysuria sx.  C/o abdom soreness/pain only with coughing. Vesicular rash noted to base of nose/upper lip onset yesterday.   Coarse/diminished breath sounds.

## 2020-03-08 NOTE — Discharge Instructions (Signed)
Begin doxycycline twice daily for the next week, take with food May use Zofran as needed for nausea/vomiting Tessalon/benzonatate for daytime cough May try using Phenergan DM for nighttime cough-may cause drowsiness, do not drive or work after taking Albuterol inhaler as needed for shortness of breath, wheezing Please continue to rest and drink plenty of fluids  Please follow-up if any symptoms not improving or worsening, developing increased difficulty breathing, shortness of breath, fevers or abdominal pain/persistent vomiting

## 2020-03-25 ENCOUNTER — Other Ambulatory Visit: Payer: Self-pay

## 2020-03-25 ENCOUNTER — Encounter (HOSPITAL_COMMUNITY): Payer: Self-pay | Admitting: Emergency Medicine

## 2020-03-25 ENCOUNTER — Emergency Department (HOSPITAL_COMMUNITY)
Admission: EM | Admit: 2020-03-25 | Discharge: 2020-03-25 | Disposition: A | Discharge status: Home / Self Care | Payer: Self-pay | Attending: Emergency Medicine | Admitting: Emergency Medicine

## 2020-03-25 DIAGNOSIS — R109 Unspecified abdominal pain: Secondary | ICD-10-CM | POA: Insufficient documentation

## 2020-03-25 DIAGNOSIS — R0789 Other chest pain: Secondary | ICD-10-CM | POA: Insufficient documentation

## 2020-03-25 DIAGNOSIS — K5900 Constipation, unspecified: Secondary | ICD-10-CM | POA: Insufficient documentation

## 2020-03-25 DIAGNOSIS — Z5321 Procedure and treatment not carried out due to patient leaving prior to being seen by health care provider: Secondary | ICD-10-CM | POA: Insufficient documentation

## 2020-03-25 LAB — BASIC METABOLIC PANEL
Anion gap: 11 (ref 5–15)
BUN: 11 mg/dL (ref 6–20)
CO2: 21 mmol/L — ABNORMAL LOW (ref 22–32)
Calcium: 8.9 mg/dL (ref 8.9–10.3)
Chloride: 105 mmol/L (ref 98–111)
Creatinine, Ser: 0.6 mg/dL (ref 0.44–1.00)
GFR calc Af Amer: >60 mL/min (ref >60)
GFR calc non Af Amer: >60 mL/min (ref >60)
Glucose, Bld: 223 mg/dL — ABNORMAL HIGH (ref 70–99)
Potassium: 4.3 mmol/L (ref 3.5–5.1)
Sodium: 137 mmol/L (ref 135–145)

## 2020-03-25 LAB — CBC
HCT: 42.1 % (ref 36.0–46.0)
Hemoglobin: 13.8 g/dL (ref 12.0–15.0)
MCH: 31.2 pg (ref 26.0–34.0)
MCHC: 32.8 g/dL (ref 30.0–36.0)
MCV: 95 fL (ref 80.0–100.0)
Platelets: 232 10*3/uL (ref 150–400)
RBC: 4.43 MIL/uL (ref 3.87–5.11)
RDW: 12.1 % (ref 11.5–15.5)
WBC: 7.3 10*3/uL (ref 4.0–10.5)
nRBC: 0 % (ref 0.0–0.2)

## 2020-03-25 LAB — TROPONIN I (HIGH SENSITIVITY): Troponin I (High Sensitivity): 8 ng/L (ref <18)

## 2020-03-25 MED ORDER — SODIUM CHLORIDE 0.9% FLUSH: 3.0000 mL | Freq: Once | INTRAVENOUS | Status: DC

## 2020-03-25 NOTE — ED Notes (Signed)
Pt called multiple times for Xray, no answer

## 2020-03-25 NOTE — ED Triage Notes (Signed)
Pt to triage via [facility].  Reports L sided stabbing chest pain, abd tightness, and constipation since yesterday.  During triage pt states she feels like she is having a bowel movement and went to bathroom.

## 2020-03-25 NOTE — ED Notes (Signed)
Called for room, no answer

## 2020-03-25 NOTE — ED Notes (Signed)
No answer lobby 
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
## 2020-03-26 ENCOUNTER — Ambulatory Visit: Payer: Self-pay | Admitting: Cardiology

## 2020-04-18 ENCOUNTER — Encounter: Payer: Self-pay | Admitting: Cardiology

## 2020-04-18 ENCOUNTER — Other Ambulatory Visit: Payer: Self-pay

## 2020-04-18 ENCOUNTER — Ambulatory Visit (INDEPENDENT_AMBULATORY_CARE_PROVIDER_SITE_OTHER): Payer: Self-pay | Admitting: Cardiology

## 2020-04-18 VITALS — BP 123/71 | HR 55 | Ht 63.0 in | Wt 203.4 lb

## 2020-04-18 DIAGNOSIS — I10 Essential (primary) hypertension: Secondary | ICD-10-CM

## 2020-04-18 DIAGNOSIS — K219 Gastro-esophageal reflux disease without esophagitis: Secondary | ICD-10-CM

## 2020-04-18 DIAGNOSIS — E785 Hyperlipidemia, unspecified: Secondary | ICD-10-CM

## 2020-04-18 DIAGNOSIS — I2510 Atherosclerotic heart disease of native coronary artery without angina pectoris: Secondary | ICD-10-CM

## 2020-04-18 MED ORDER — METOPROLOL SUCCINATE ER 25 MG PO TB24
25.0000 mg | ORAL_TABLET | Freq: Every day | ORAL | 3 refills | Status: DC
Start: 2020-04-18 — End: 2020-05-23

## 2020-04-18 MED ORDER — NITROGLYCERIN 0.4 MG SL SUBL
0.4000 mg | SUBLINGUAL_TABLET | SUBLINGUAL | 3 refills | Status: DC | PRN
Start: 2020-04-18 — End: 2020-05-23

## 2020-04-18 MED ORDER — CLOPIDOGREL BISULFATE 75 MG PO TABS
75.0000 mg | ORAL_TABLET | Freq: Every day | ORAL | 3 refills | Status: DC
Start: 2020-04-18 — End: 2020-05-05

## 2020-04-18 MED ORDER — FAMOTIDINE 10 MG PO TABS
10.0000 mg | ORAL_TABLET | Freq: Two times a day (BID) | ORAL | 3 refills | Status: DC
Start: 2020-04-18 — End: 2020-05-23

## 2020-04-18 MED FILL — CLOPIDOGREL 75 MG TABLET: 75 | 30 days supply | Qty: 30 | Fill #0

## 2020-04-18 MED FILL — NITROGLYCERIN 0.4 MG TAB SL: 0.4 | 25 days supply | Qty: 25 | Fill #0

## 2020-04-18 MED FILL — METOPROLOL SUCCINATE ER 25: 25 | 30 days supply | Qty: 30 | Fill #0

## 2020-04-18 NOTE — Patient Instructions (Addendum)
Medication Instructions:  STOP Brilinta  START clopidogrel (Plavix) TODAY at 8 pm:  take 300 mg (4 tablets) Then tomorrow start 75 mg (1 tablet) daily  STOP omeprazole START famotidine (Pepcid) 10 mg two times daily  START metoprolol succinate (Toprol XL) 25 mg daily  Take sublingual nitroglycerin AS NEEDED for chest pain  *If you need a refill on your cardiac medications before your next appointment, please call your pharmacy*   Lab Work: BMET, CBC, Lipid today  If you have labs (blood work) drawn today and your tests are completely normal, you will receive your results only by:  MyChart Message (if you have MyChart) OR  A paper copy in the mail If you have any lab test that is abnormal or we need to change your treatment, we will call you to review the results.  Follow-Up: At [facility], you and your health needs are our priority.  As part of our continuing mission to provide you with exceptional heart care, we have created designated Provider Care Teams.  These Care Teams include your primary Cardiologist (physician) and Advanced Practice Providers (APPs -  Physician Assistants and Nurse Practitioners) who all work together to provide you with the care you need, when you need it.  We recommend signing up for the patient portal called "MyChart".  Sign up information is provided on this After Visit Summary.  MyChart is used to connect with patients for Virtual Visits (Telemedicine).  Patients are able to view lab/test results, encounter notes, upcoming appointments, etc.  Non-urgent messages can be sent to your provider as well.   To learn more about what you can do with MyChart, go to [email or web address].    Your next appointment:   1 month(s)  The format for your next appointment:   In Person  Provider:   Epifanio Lesches, MD
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
## 2020-04-19 LAB — BASIC METABOLIC PANEL
BUN/Creatinine Ratio: 17 (ref 9–23)
BUN: 12 mg/dL (ref 6–24)
CO2: 23 mmol/L (ref 20–29)
Calcium: 9.5 mg/dL (ref 8.7–10.2)
Chloride: 104 mmol/L (ref 96–106)
Creatinine, Ser: 0.69 mg/dL (ref 0.57–1.00)
GFR calc Af Amer: 113 mL/min/{1.73_m2} (ref >59)
GFR calc non Af Amer: 98 mL/min/{1.73_m2} (ref >59)
Glucose: 183 mg/dL — ABNORMAL HIGH (ref 65–99)
Potassium: 4.2 mmol/L (ref 3.5–5.2)
Sodium: 139 mmol/L (ref 134–144)

## 2020-04-19 LAB — LIPID PANEL
Chol/HDL Ratio: 3.5 ratio (ref 0.0–4.4)
Cholesterol, Total: 154 mg/dL (ref 100–199)
HDL: 44 mg/dL (ref >39)
LDL Chol Calc (NIH): 95 mg/dL (ref 0–99)
Triglycerides: 80 mg/dL (ref 0–149)
VLDL Cholesterol Cal: 15 mg/dL (ref 5–40)

## 2020-04-19 LAB — CBC
Hematocrit: 40.3 % (ref 34.0–46.6)
Hemoglobin: 13.6 g/dL (ref 11.1–15.9)
MCH: 31.1 pg (ref 26.6–33.0)
MCHC: 33.7 g/dL (ref 31.5–35.7)
MCV: 92 fL (ref 79–97)
Platelets: 235 10*3/uL (ref 150–450)
RBC: 4.37 x10E6/uL (ref 3.77–5.28)
RDW: 12.6 % (ref 11.7–15.4)
WBC: 6.3 10*3/uL (ref 3.4–10.8)

## 2020-04-25 ENCOUNTER — Telehealth: Payer: Self-pay

## 2020-04-25 NOTE — Telephone Encounter (Signed)
Called to speak with patient regarding assistance applying for Medicaid, medication assistance, and transportation needs.  Both numbers listed are disconnected. Unable to leave a message.

## 2020-04-27 ENCOUNTER — Telehealth: Payer: Self-pay

## 2020-04-27 DIAGNOSIS — Z789 Other specified health status: Secondary | ICD-10-CM

## 2020-04-27 NOTE — Telephone Encounter (Signed)
Called to discuss applying for Medicaid and assistance with medication. Numbers listed for patient is invalid. Sent a message to patient via MyChart to have her contact me directly at [phone].

## 2020-05-05 ENCOUNTER — Emergency Department (HOSPITAL_COMMUNITY)
Admission: EM | Admit: 2020-05-05 | Discharge: 2020-05-05 | Disposition: A | Discharge status: Home / Self Care | Payer: Self-pay | Attending: Emergency Medicine | Admitting: Emergency Medicine

## 2020-05-05 ENCOUNTER — Ambulatory Visit (HOSPITAL_COMMUNITY)
Admission: EM | Admit: 2020-05-05 | Discharge: 2020-05-05 | Disposition: A | Discharge status: Home / Self Care | Payer: Self-pay | Attending: Family Medicine | Admitting: Family Medicine

## 2020-05-05 ENCOUNTER — Emergency Department (HOSPITAL_COMMUNITY): Payer: Self-pay

## 2020-05-05 ENCOUNTER — Other Ambulatory Visit: Payer: Self-pay

## 2020-05-05 ENCOUNTER — Encounter (HOSPITAL_COMMUNITY): Payer: Self-pay | Admitting: Emergency Medicine

## 2020-05-05 DIAGNOSIS — M79652 Pain in left thigh: Secondary | ICD-10-CM | POA: Insufficient documentation

## 2020-05-05 DIAGNOSIS — M79604 Pain in right leg: Secondary | ICD-10-CM | POA: Insufficient documentation

## 2020-05-05 DIAGNOSIS — E119 Type 2 diabetes mellitus without complications: Secondary | ICD-10-CM | POA: Insufficient documentation

## 2020-05-05 DIAGNOSIS — R252 Cramp and spasm: Secondary | ICD-10-CM | POA: Insufficient documentation

## 2020-05-05 DIAGNOSIS — Z79899 Other long term (current) drug therapy: Secondary | ICD-10-CM | POA: Insufficient documentation

## 2020-05-05 DIAGNOSIS — I214 Non-ST elevation (NSTEMI) myocardial infarction: Secondary | ICD-10-CM | POA: Insufficient documentation

## 2020-05-05 DIAGNOSIS — Z7984 Long term (current) use of oral hypoglycemic drugs: Secondary | ICD-10-CM | POA: Insufficient documentation

## 2020-05-05 DIAGNOSIS — M25511 Pain in right shoulder: Secondary | ICD-10-CM | POA: Insufficient documentation

## 2020-05-05 DIAGNOSIS — I2510 Atherosclerotic heart disease of native coronary artery without angina pectoris: Secondary | ICD-10-CM | POA: Insufficient documentation

## 2020-05-05 DIAGNOSIS — I10 Essential (primary) hypertension: Secondary | ICD-10-CM | POA: Insufficient documentation

## 2020-05-05 DIAGNOSIS — Z5320 Procedure and treatment not carried out because of patient's decision for unspecified reasons: Secondary | ICD-10-CM | POA: Insufficient documentation

## 2020-05-05 DIAGNOSIS — R079 Chest pain, unspecified: Secondary | ICD-10-CM

## 2020-05-05 DIAGNOSIS — M79605 Pain in left leg: Secondary | ICD-10-CM | POA: Insufficient documentation

## 2020-05-05 LAB — CBC
HCT: 38.9 % (ref 36.0–46.0)
Hemoglobin: 12.9 g/dL (ref 12.0–15.0)
MCH: 31.1 pg (ref 26.0–34.0)
MCHC: 33.2 g/dL (ref 30.0–36.0)
MCV: 93.7 fL (ref 80.0–100.0)
Platelets: 223 10*3/uL (ref 150–400)
RBC: 4.15 MIL/uL (ref 3.87–5.11)
RDW: 12.4 % (ref 11.5–15.5)
WBC: 5.7 10*3/uL (ref 4.0–10.5)
nRBC: 0 % (ref 0.0–0.2)

## 2020-05-05 LAB — CK: Total CK: 88 U/L (ref 38–234)

## 2020-05-05 LAB — BASIC METABOLIC PANEL
Anion gap: 9 (ref 5–15)
BUN: 10 mg/dL (ref 6–20)
CO2: 22 mmol/L (ref 22–32)
Calcium: 9 mg/dL (ref 8.9–10.3)
Chloride: 107 mmol/L (ref 98–111)
Creatinine, Ser: 0.65 mg/dL (ref 0.44–1.00)
GFR calc Af Amer: >60 mL/min (ref >60)
GFR calc non Af Amer: >60 mL/min (ref >60)
Glucose, Bld: 319 mg/dL — ABNORMAL HIGH (ref 70–99)
Potassium: 4.3 mmol/L (ref 3.5–5.1)
Sodium: 138 mmol/L (ref 135–145)

## 2020-05-05 LAB — TROPONIN I (HIGH SENSITIVITY): Troponin I (High Sensitivity): 71 ng/L — ABNORMAL HIGH (ref <18)

## 2020-05-05 MED ORDER — CLOPIDOGREL BISULFATE 75 MG PO TABS
75.0000 mg | ORAL_TABLET | Freq: Every day | ORAL | 0 refills | Status: DC
Start: 2020-05-05 — End: 2020-05-21

## 2020-05-05 MED ORDER — SODIUM CHLORIDE 0.9 % IV BOLUS: 1000.0000 mL | Freq: Once | INTRAVENOUS | Status: DC

## 2020-05-05 MED ORDER — SODIUM CHLORIDE 0.9% FLUSH: 3.0000 mL | Freq: Once | INTRAVENOUS | Status: DC

## 2020-05-05 MED ORDER — CLOPIDOGREL BISULFATE 300 MG PO TABS
300.0000 mg | ORAL_TABLET | Freq: Once | ORAL | Status: AC
  Administered 2020-05-05: 300 mg via ORAL
  Filled 2020-05-05: qty 1

## 2020-05-05 NOTE — ED Notes (Signed)
Pt refuses IV start and IV fluid bolus

## 2020-05-05 NOTE — ED Notes (Signed)
Pt refusing EKG

## 2020-05-05 NOTE — Care Management (Signed)
Patient is entered into the [facility] program for medication assistance. When ready for discharge [facility] letter. MATCH letter given to patient.

## 2020-05-05 NOTE — ED Notes (Signed)
EKG shown to M. Kensett, [name].  Patient is being discharged from the Urgent Care Center and sent to the Emergency Department. Per M. Mani, [name], patient is stable but in need of higher level of care due to chest pain with cardiac hx. Patient is aware and verbalizes understanding of plan of care.  Vitals:   05/05/20 1254  BP: 120/73  Pulse: 68  Resp: 18  SpO2: 100%

## 2020-05-05 NOTE — ED Notes (Signed)
PT has refused Iv, IV fluids and repeat EKG . Pt reports she wants to go home. EDP is aware.

## 2020-05-05 NOTE — Discharge Instructions (Addendum)
You are leaving AGAINST MEDICAL ADVICE.  It was recommended you stay in the hospital for further evaluation of your heart.  If at any point you change your mind, develop new or worsening symptoms, you may return to the hospital or call 911.

## 2020-05-05 NOTE — ED Notes (Signed)
Patient verbalizes understanding of discharge instructions . Opportunity for questions and answers were provided . Armband removed by staff ,Pt discharged from ED. W/C  offered at D/C  and Declined W/C at D/C and was escorted to lobby by RN.  
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
## 2020-05-05 NOTE — ED Triage Notes (Signed)
Pt. Stated, I was sent here from [facility] for chest pain, leg pain and shoulder pain. Im almost out of my blood thinners.

## 2020-05-05 NOTE — ED Triage Notes (Signed)
C/O intermittent left chest pains since last night; also c/o intermittent cramping in BLE.  Denies any pain at present. States ran out of blood thinners last night.

## 2020-05-05 NOTE — ED Notes (Signed)
Pt sitting in chair crying saying she wants to go home and not die

## 2020-05-05 NOTE — ED Notes (Signed)
PT refuses IV site at this time

## 2020-05-21 ENCOUNTER — Ambulatory Visit (HOSPITAL_BASED_OUTPATIENT_CLINIC_OR_DEPARTMENT_OTHER): Payer: Self-pay | Admitting: Pharmacist

## 2020-05-21 ENCOUNTER — Other Ambulatory Visit: Payer: Self-pay

## 2020-05-21 ENCOUNTER — Ambulatory Visit: Payer: Self-pay | Attending: Family Medicine | Admitting: Family Medicine

## 2020-05-21 VITALS — BP 111/68 | HR 62 | Ht 63.0 in | Wt 208.2 lb

## 2020-05-21 DIAGNOSIS — Z1231 Encounter for screening mammogram for malignant neoplasm of breast: Secondary | ICD-10-CM

## 2020-05-21 DIAGNOSIS — R5383 Other fatigue: Secondary | ICD-10-CM

## 2020-05-21 DIAGNOSIS — Z7189 Other specified counseling: Secondary | ICD-10-CM

## 2020-05-21 DIAGNOSIS — I10 Essential (primary) hypertension: Secondary | ICD-10-CM

## 2020-05-21 DIAGNOSIS — Z1211 Encounter for screening for malignant neoplasm of colon: Secondary | ICD-10-CM

## 2020-05-21 DIAGNOSIS — I214 Non-ST elevation (NSTEMI) myocardial infarction: Secondary | ICD-10-CM

## 2020-05-21 DIAGNOSIS — E1159 Type 2 diabetes mellitus with other circulatory complications: Secondary | ICD-10-CM

## 2020-05-21 DIAGNOSIS — E559 Vitamin D deficiency, unspecified: Secondary | ICD-10-CM

## 2020-05-21 LAB — POCT GLYCOSYLATED HEMOGLOBIN (HGB A1C): HbA1c, POC (controlled diabetic range): 9.4 % — AB (ref 0.0–7.0)

## 2020-05-21 LAB — GLUCOSE, POCT (MANUAL RESULT ENTRY): POC Glucose: 332 mg/dl — AB (ref 70–99)

## 2020-05-21 MED ORDER — CLOPIDOGREL BISULFATE 75 MG PO TABS: 75.0000 mg | ORAL_TABLET | Freq: Every day | ORAL | 1 refills | Status: DC

## 2020-05-21 MED ORDER — ERGOCALCIFEROL 1.25 MG (50000 UT) PO CAPS: 50000.0000 [IU] | ORAL_CAPSULE | ORAL | 0 refills | Status: DC

## 2020-05-21 MED ORDER — VICTOZA 18 MG/3ML ~~LOC~~ SOPN: PEN_INJECTOR | SUBCUTANEOUS | 6 refills | Status: DC

## 2020-05-21 MED FILL — !VICTOZA 18MG/3ML INJECT: 18 | 17 days supply | Qty: 3 | Fill #0

## 2020-05-21 NOTE — Patient Instructions (Signed)
Liraglutide injection What is this medicine? LIRAGLUTIDE (LIR a GLOO tide) is used to improve blood sugar control in adults with type 2 diabetes. This medicine may be used with other diabetes medicines. This drug may also reduce the risk of heart attack or stroke if you have type 2 diabetes and risk factors for heart disease. This medicine may be used for other purposes; ask your health care provider or pharmacist if you have questions. COMMON BRAND NAME(S): Victoza What should I tell my health care provider before I take this medicine? They need to know if you have any of these conditions:  endocrine tumors (MEN 2) or if someone in your family had these tumors  gallbladder disease  high cholesterol  history of alcohol abuse problem  history of pancreatitis  kidney disease or if you are on dialysis  liver disease  previous swelling of the tongue, face, or lips with difficulty breathing, difficulty swallowing, hoarseness, or tightening of the throat  stomach problems  thyroid cancer or if someone in your family had thyroid cancer  an unusual or allergic reaction to liraglutide, other medicines, foods, dyes, or preservatives  pregnant or trying to get pregnant  breast-feeding How should I use this medicine? This medicine is for injection under the skin of your upper leg, stomach area, or upper arm. You will be taught how to prepare and give this medicine. Use exactly as directed. Take your medicine at regular intervals. Do not take it more often than directed. It is important that you put your used needles and syringes in a special sharps container. Do not put them in a trash can. If you do not have a sharps container, call your pharmacist or healthcare provider to get one [place] will be given to you by the pharmacist with each prescription and refill. Be sure to read this information carefully each time. This drug comes with INSTRUCTIONS FOR USE. Ask your pharmacist  for directions on how to use this drug. Read the information carefully. Talk to your pharmacist or health care provider if you have questions. Talk to your pediatrician regarding the use of this medicine in children. While this drug may be prescribed for children as young as 28 years of age, precautions do apply. Overdosage: If you think you have taken too much of this medicine contact a poison control center or emergency room at once. NOTE: This medicine is only for you. Do not share this medicine with others. What if I miss a dose? If you miss a dose, take it as soon as you can. If it is almost time for your next dose, take only that dose. Do not take double or extra doses. What may interact with this medicine?  other medicines for diabetes Many medications may cause changes in blood sugar, these include:  alcohol containing beverages  antiviral medicines for HIV or AIDS  aspirin and aspirin-like drugs  certain medicines for blood pressure, heart disease, irregular heart beat  chromium  diuretics  female hormones, such as estrogens or progestins, birth control pills  fenofibrate  gemfibrozil  isoniazid  lanreotide  female hormones or anabolic steroids  MAOIs like Carbex, Eldepryl, Marplan, Nardil, and Parnate  medicines for weight loss  medicines for allergies, asthma, cold, or cough  medicines for depression, anxiety, or psychotic disturbances  niacin  nicotine  NSAIDs, medicines for pain and inflammation, like ibuprofen or naproxen  octreotide  pasireotide  pentamidine  phenytoin  probenecid  quinolone antibiotics such as ciprofloxacin, levofloxacin,  ofloxacin  some herbal dietary supplements  steroid medicines such as prednisone or cortisone  sulfamethoxazole; trimethoprim  thyroid hormones Some medications can hide the warning symptoms of low blood sugar (hypoglycemia). You may need to monitor your blood sugar more closely if you are taking one  of these medications. These include:  beta-blockers, often used for high blood pressure or heart problems (examples include atenolol, metoprolol, propranolol)  clonidine  guanethidine  reserpine This list may not describe all possible interactions. Give your health care provider a list of all the medicines, herbs, non-prescription drugs, or dietary supplements you use. Also tell them if you smoke, drink alcohol, or use illegal drugs. Some items may interact with your medicine. What should I watch for while using this medicine? Visit your doctor or health care professional for regular checks on your progress. Drink plenty of fluids while taking this medicine. Check with your doctor or health care professional if you get an attack of severe diarrhea, nausea, and vomiting. The loss of too much body fluid can make it dangerous for you to take this medicine. A test called the HbA1C (A1C) will be monitored. This is a simple blood test. It measures your blood sugar control over the last 2 to 3 months. You will receive this test every 3 to 6 months. Learn how to check your blood sugar. Learn the symptoms of low and high blood sugar and how to manage them. Always carry a quick-source of sugar with you in case you have symptoms of low blood sugar. Examples include hard sugar candy or glucose tablets. Make sure others know that you can choke if you eat or drink when you develop serious symptoms of low blood sugar, such as seizures or unconsciousness. They must get medical help at once. Tell your doctor or health care professional if you have high blood sugar. You might need to change the dose of your medicine. If you are sick or exercising more than usual, you might need to change the dose of your medicine. Do not skip meals. Ask your doctor or health care professional if you should avoid alcohol. Many nonprescription cough and cold products contain sugar or alcohol. These can affect blood sugar. Pens should  never be shared. Even if the needle is changed, sharing may result in passing of viruses like hepatitis or HIV. Wear a medical ID bracelet or chain, and carry a card that describes your disease and details of your medicine and dosage times. What side effects may I notice from receiving this medicine? Side effects that you should report to your doctor or health care professional as soon as possible:  allergic reactions like skin rash, itching or hives, swelling of the face, lips, or tongue  breathing problems  diarrhea that continues or is severe  lump or swelling on the neck  severe nausea  signs and symptoms of infection like fever or chills; cough; sore throat; pain or trouble passing urine  signs and symptoms of low blood sugar such as feeling anxious, confusion, dizziness, increased hunger, unusually weak or tired, sweating, shakiness, cold, irritable, headache, blurred vision, fast heartbeat, loss of consciousness  signs and symptoms of kidney injury like trouble passing urine or change in the amount of urine  trouble swallowing  unusual stomach upset or pain  vomiting Side effects that usually do not require medical attention (report to your doctor or health care professional if they continue or are bothersome):  constipation  decreased appetite  diarrhea  fatigue  headache  nausea  pain, redness, or irritation at site where injected  stomach upset  stuffy or runny nose This list may not describe all possible side effects. Call your doctor for medical advice about side effects. You may report side effects to FDA at 1-800-FDA-1088. Where should I keep my medicine? Keep out of the reach of children. Store unopened pen in a refrigerator between 2 and 8 degrees C (36 and 46 degrees F). Do not freeze or use if the medicine has been frozen. Protect from light and excessive heat. After you first use the pen, it can be stored at room temperature between 15 and 30 degrees  C (59 and 86 degrees F) or in a refrigerator. Throw away your used pen after 30 days or after the expiration date, whichever comes first. Do not store your pen with the needle attached. If the needle is left on, medicine may leak from the pen. NOTE: This sheet is a summary. It may not cover all possible information. If you have questions about this medicine, talk to your doctor, pharmacist, or health care provider.  2020 Elsevier/Gold Standard (2019-07-05 09:39:47)  

## 2020-05-21 NOTE — Progress Notes (Signed)
Patient was educated on the use of the Victoza pen. Reviewed necessary supplies and operation of the pen. Also reviewed goal blood glucose levels. Patient was able to demonstrate use. All questions and concerns were addressed.  Butch Penny, PharmD, CPP Clinical Pharmacist [facility] [phone]
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
## 2020-05-23 ENCOUNTER — Encounter: Payer: Self-pay | Admitting: Cardiology

## 2020-05-23 ENCOUNTER — Ambulatory Visit (INDEPENDENT_AMBULATORY_CARE_PROVIDER_SITE_OTHER): Payer: Self-pay | Admitting: Cardiology

## 2020-05-23 ENCOUNTER — Other Ambulatory Visit: Payer: Self-pay

## 2020-05-23 VITALS — BP 186/92 | HR 65 | Ht 65.0 in | Wt 209.2 lb

## 2020-05-23 DIAGNOSIS — E785 Hyperlipidemia, unspecified: Secondary | ICD-10-CM

## 2020-05-23 DIAGNOSIS — R079 Chest pain, unspecified: Secondary | ICD-10-CM

## 2020-05-23 DIAGNOSIS — K219 Gastro-esophageal reflux disease without esophagitis: Secondary | ICD-10-CM

## 2020-05-23 DIAGNOSIS — I2510 Atherosclerotic heart disease of native coronary artery without angina pectoris: Secondary | ICD-10-CM

## 2020-05-23 DIAGNOSIS — I10 Essential (primary) hypertension: Secondary | ICD-10-CM

## 2020-05-23 MED ORDER — ROSUVASTATIN CALCIUM 40 MG PO TABS: 40.0000 mg | ORAL_TABLET | Freq: Every day | ORAL | 3 refills | Status: DC

## 2020-05-23 MED ORDER — LISINOPRIL 20 MG PO TABS: 20.0000 mg | ORAL_TABLET | Freq: Every day | ORAL | 6 refills | Status: DC

## 2020-05-23 MED ORDER — NITROGLYCERIN 0.4 MG SL SUBL: 0.4000 mg | SUBLINGUAL_TABLET | SUBLINGUAL | 3 refills | Status: DC | PRN

## 2020-05-23 MED ORDER — ROSUVASTATIN CALCIUM 40 MG PO TABS
40.0000 mg | ORAL_TABLET | Freq: Every day | ORAL | 3 refills | Status: DC
Start: 2020-05-23 — End: 2020-05-23
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
## 2020-05-23 NOTE — Patient Instructions (Signed)
Medication Instructions:  STOP atorvastatin START rosuvastatin 40 mg daily INCREASE lisinopril to 20 mg daily  *If you need a refill on your cardiac medications before your next appointment, please call your pharmacy*   Lab Work: [place] IN 1 WEEK (BMET)  If you have labs (blood work) drawn today and your tests are completely normal, you will receive your results only by: Marland Kitchen MyChart Message (if you have MyChart) OR . A paper copy in the mail If you have any lab test that is abnormal or we need to change your treatment, we will call you to review the results.   Testing/Procedures: Your physician has requested that you have an exercise stress myoview. For further information please visit [email or web address] Please follow instruction sheet, as given.  Your physician has requested that you have an echocardiogram. Echocardiography is a painless test that uses sound waves to create images of your heart. It provides your doctor with information about the size and shape of your heart and how well your heart's chambers and valves are working. This procedure takes approximately one hour. There are no restrictions for this procedure.   Follow-Up: At [facility], you and your health needs are our priority.  As part of our continuing mission to provide you with exceptional heart care, we have created designated Provider Care Teams.  These Care Teams include your primary Cardiologist (physician) and Advanced Practice Providers (APPs -  Physician Assistants and Nurse Practitioners) who all work together to provide you with the care you need, when you need it.  We recommend signing up for the patient portal called "MyChart".  Sign up information is provided on this After Visit Summary.  MyChart is used to connect with patients for Virtual Visits (Telemedicine).  Patients are able to view lab/test results, encounter notes, upcoming appointments, etc.  Non-urgent messages can be sent to your provider as  well.   To learn more about what you can do with MyChart, go to [email or web address].    Your next appointment:   2 weeks with pharmacist (BP and LIPID follow up) 1 month with Dr. Bjorn Pippin  **BRING ALL MEDICATIONS TO APPOINTMENT WITH PHARMACIST**

## 2020-05-24 ENCOUNTER — Telehealth (HOSPITAL_COMMUNITY): Payer: Self-pay

## 2020-05-24 NOTE — Telephone Encounter (Signed)
Encounter complete. 

## 2020-05-28 ENCOUNTER — Other Ambulatory Visit: Payer: Self-pay

## 2020-05-28 ENCOUNTER — Other Ambulatory Visit: Payer: Self-pay | Admitting: Family Medicine

## 2020-05-28 ENCOUNTER — Ambulatory Visit: Payer: Self-pay

## 2020-05-28 ENCOUNTER — Ambulatory Visit: Payer: Self-pay | Attending: Family Medicine | Admitting: Pharmacist

## 2020-05-28 DIAGNOSIS — M25552 Pain in left hip: Secondary | ICD-10-CM

## 2020-05-28 DIAGNOSIS — M25551 Pain in right hip: Secondary | ICD-10-CM

## 2020-05-28 DIAGNOSIS — E1159 Type 2 diabetes mellitus with other circulatory complications: Secondary | ICD-10-CM

## 2020-05-28 MED ORDER — METHOCARBAMOL 500 MG PO TABS: 500.0000 mg | ORAL_TABLET | Freq: Three times a day (TID) | ORAL | 1 refills | Status: DC | PRN

## 2020-05-28 MED ORDER — METHOCARBAMOL 500 MG PO TABS
500.0000 mg | ORAL_TABLET | Freq: Three times a day (TID) | ORAL | 1 refills | Status: DC | PRN
Start: 2020-05-28 — End: 2020-12-06

## 2020-05-28 MED FILL — METHOCARBAMOL 500 MG TABS: 500 | 20 days supply | Qty: 60 | Fill #0

## 2020-05-28 MED FILL — CLOPIDOGREL 75 MG TABLET: 75 | 30 days supply | Qty: 30 | Fill #0

## 2020-05-28 MED FILL — LISINOPRIL 10 MG TABS: 10 | 90 days supply | Qty: 90 | Fill #1

## 2020-05-28 MED FILL — VIT D2 1.25 MG (50,000 UNIT: 1.25 MG | 84 days supply | Qty: 12 | Fill #0

## 2020-05-28 NOTE — Progress Notes (Signed)
Patient was educated on the use of the Victoza pen. Reviewed necessary supplies and operation of the pen. Also reviewed goal blood glucose levels. Patient was able to demonstrate use. All questions and concerns were addressed.  She has been taking 0.6 mg daily for 1 week and is experiencing some minor GI upset. It has improved slightly since initiation. Counseling given. Encouraged patient to increase dose to 1.2 mg daily. She will see me next week for continued titration.   Time spent face-to-face counseling: 15 minutes.  Follow-up: 1 week   Butch Penny, PharmD, CPP Clinical Pharmacist [facility] [phone]

## 2020-05-28 NOTE — Telephone Encounter (Signed)
Requested medication (s) are due for refill today: yes  Requested medication (s) are on the active medication list:yes  Last refill: 02/04/20  #60  1 refill  Future visit scheduled no  Notes to clinic:Not delegated  Historical provider  Requested Prescriptions  Pending Prescriptions Disp Refills   methocarbamol (ROBAXIN) 500 MG tablet [Pharmacy Med Name: METHOCARBAMOL 500 MG TABS 500 Tablet] 60 tablet 1    Sig: Take 1 tablet (500 mg total) by mouth every 8 (eight) hours as needed for muscle spasms.      Not Delegated - Analgesics:  Muscle Relaxants Failed - 05/28/2020  9:26 AM      Failed - This refill cannot be delegated      Passed - Valid encounter within last 6 months    Recent Outpatient Visits           1 week ago Encounter for medication review and counseling   [facility] [place], Jeannett Senior L, RPH-CPP   1 week ago Type 2 diabetes mellitus with other circulatory complication, without long-term current use of insulin (HCC)   [place] [place], Odette Horns, MD   3 months ago Type 2 diabetes mellitus with other circulatory complication, without long-term current use of insulin (HCC)   [place] [facility] [place], Odette Horns, MD   6 months ago Type 2 diabetes mellitus with other circulatory complication, without long-term current use of insulin (HCC)   [place] [facility] [place], Odette Horns, MD   4 years ago Type 2 diabetes mellitus without complication, without long-term current use of insulin (HCC)   [place] [facility] Hoy Register, MD       Future Appointments             In 1 week Lois Huxley, Cornelius Moras, RPH-CPP [facility]   In 1 week  [facility] [address] Office, [facility]   In 2 weeks Drucilla Chalet, RPH-CPP [facility]   In 3 weeks Lois Huxley, Cornelius Moras, RPH-CPP [facility]   In 3 weeks Shieh Ishikawa, MD [facility] [place], [facility]

## 2020-05-28 NOTE — Addendum Note (Signed)
Addended by: Lois Huxley, Jeannett Senior L on: 05/28/2020 10:08 AM   Modules accepted: Orders

## 2020-05-29 ENCOUNTER — Telehealth (HOSPITAL_COMMUNITY): Payer: Self-pay

## 2020-05-29 LAB — BASIC METABOLIC PANEL
BUN/Creatinine Ratio: 24 — ABNORMAL HIGH (ref 9–23)
BUN: 16 mg/dL (ref 6–24)
CO2: 21 mmol/L (ref 20–29)
Calcium: 9 mg/dL (ref 8.7–10.2)
Chloride: 104 mmol/L (ref 96–106)
Creatinine, Ser: 0.68 mg/dL (ref 0.57–1.00)
GFR calc Af Amer: 114 mL/min/{1.73_m2} (ref >59)
GFR calc non Af Amer: 99 mL/min/{1.73_m2} (ref >59)
Glucose: 216 mg/dL — ABNORMAL HIGH (ref 65–99)
Potassium: 4.3 mmol/L (ref 3.5–5.2)
Sodium: 140 mmol/L (ref 134–144)

## 2020-05-29 NOTE — Telephone Encounter (Signed)
Encounter complete. 

## 2020-05-30 ENCOUNTER — Ambulatory Visit (HOSPITAL_COMMUNITY)
Admission: RE | Admit: 2020-05-30 | Payer: Self-pay | Source: Ambulatory Visit | Attending: Cardiology | Admitting: Cardiology

## 2020-05-31 ENCOUNTER — Telehealth: Payer: Self-pay

## 2020-05-31 NOTE — Telephone Encounter (Signed)
Patient name and DOB has been verified Patient was informed of lab results. Patient had no questions.  

## 2020-05-31 NOTE — Telephone Encounter (Signed)
-----   Message from Hoy Register, MD sent at 05/29/2020  1:32 PM EDT ----- Please inform her labs are stable.

## 2020-06-01 ENCOUNTER — Telehealth (HOSPITAL_COMMUNITY): Payer: Self-pay

## 2020-06-01 NOTE — Telephone Encounter (Signed)
Encounter complete. 

## 2020-06-04 ENCOUNTER — Ambulatory Visit: Payer: Self-pay | Admitting: Pharmacist

## 2020-06-05 ENCOUNTER — Telehealth (HOSPITAL_COMMUNITY): Payer: Self-pay | Admitting: *Deleted

## 2020-06-05 NOTE — Telephone Encounter (Signed)
Close encounter 

## 2020-06-06 ENCOUNTER — Ambulatory Visit: Payer: Self-pay

## 2020-06-06 ENCOUNTER — Ambulatory Visit (HOSPITAL_COMMUNITY)
Admission: RE | Admit: 2020-06-06 | Payer: Self-pay | Source: Ambulatory Visit | Attending: Cardiology | Admitting: Cardiology

## 2020-06-06 NOTE — Progress Notes (Deleted)
Patient ID: [name]                 DOB: 06/17/1966                    MRN: [id number]     HPI: [name] is a 53 y.o. female patient referred to lipid/HTN clinic by Schumann. PMH is significant for CAD s/p STEMI Dec 2020, HLD and DM. She was on atorvastatin, but had myalgias. Was switched to rosuvastatin 40mg daily. Her blood pressure was elevated at last appointment with Dr. Schumann. Lisinopril was increased to 20mg daily. She is having a myocardial perfusion stress test today. Of note, patient does not have insurance.  Needs BMP Previous meds Compliance Dizziness, lightheadedness, headache, blurred vision, SOB, swelling Why was HCTZ stopped  Current Lipid Medications: rosuvastatin 40mg daily Current HTN Medications: lisinopril 20mg daily, carvedilol 25mg twice a day Intolerances: atorvastatin 40mg (myalgias) Risk Factors: CAD, HTN, DM LDL goal: <70 (or <55 per AACE/ACE guidelines) Previously tired: metoprolol XL, benazepril, HCTZ  Diet:   Exercise:   Family History: The patient's family history includes Cancer in her maternal grandmother; Diabetes in her maternal grandmother; Healthy in her mother; Heart disease in her maternal grandmother; Tuberculosis in her maternal grandfather.  Social History:   Labs:  Past Medical History:  Diagnosis Date  . Anxiety   . Coronary artery disease   . Diabetes mellitus   . Hypertension   . Sarcoidosis     Current Outpatient Medications on File Prior to Visit  Medication Sig Dispense Refill  . carvedilol (COREG) 25 MG tablet Take 25 mg by mouth 2 (two) times daily.     . clopidogrel (PLAVIX) 75 MG tablet Take 1 tablet (75 mg total) by mouth daily. 90 tablet 1  . glipiZIDE (GLUCOTROL) 10 MG tablet Take 1 tablet (10 mg total) by mouth 2 (two) times daily before a meal. 60 tablet 6  . glucose blood test strip Check your sugar in the morning before you eat breakfast, and one hour after a meal. 100 each 2  . glucose monitoring  kit (FREESTYLE) monitoring kit 1 each by Does not apply route daily. Check glucose once in the morning before breakfast and 1 hour after a meal 1 each 0  . liraglutide (VICTOZA) 18 MG/3ML SOPN Start 0.6mg SQ once a day for 7 days, then increase to 1.2mg once a day then increase to 1.8mg thereafter 3 pen 6  . lisinopril (ZESTRIL) 20 MG tablet Take 1 tablet (20 mg total) by mouth daily. 30 tablet 6  . methocarbamol (ROBAXIN) 500 MG tablet Take 1 tablet (500 mg total) by mouth every 8 (eight) hours as needed for muscle spasms. 60 tablet 1  . nitroGLYCERIN (NITROSTAT) 0.4 MG SL tablet Place 1 tablet (0.4 mg total) under the tongue every 5 (five) minutes as needed for chest pain. 25 tablet 3  . rosuvastatin (CRESTOR) 40 MG tablet Take 1 tablet (40 mg total) by mouth daily. 90 tablet 3  . [DISCONTINUED] metFORMIN (GLUCOPHAGE) 1000 MG tablet TAKE 1 TABLET BY MOUTH 2 TIMES DAILY 60 tablet 2   No current facility-administered medications on file prior to visit.    Allergies  Allergen Reactions  . Aspirin Anaphylaxis and Swelling    Tongue swells  . Tramadol Hives  . Hydrocodone Rash  . Ibuprofen Itching  . Morphine And Related Itching  . Orange Fruit [Citrus] Rash    Assessment/Plan:  1. Hyperlipidemia -      Thank you,  Ramond Dial, Pharm.D, BCPS, CPP [place]  [address], [place], [place] [address]  Phone: [phone]; Fax: [phone]

## 2020-06-08 ENCOUNTER — Telehealth (HOSPITAL_COMMUNITY): Payer: Self-pay

## 2020-06-08 ENCOUNTER — Other Ambulatory Visit (HOSPITAL_COMMUNITY): Payer: Self-pay

## 2020-06-08 NOTE — Telephone Encounter (Signed)
Encounter complete. 

## 2020-06-10 NOTE — Progress Notes (Unsigned)
Pt seen today for follow up Victoza titration. Was last seen by PharmD on 05/28/20. Dose was increased and pt educated.   Current dose: 1.2 mg daily Adverse effects: some minor GI upset on 0.6 mg daily  - constipation - diarrhea, nausea, vomiting? - hypoglycemia? - inj site rxns?  Home BG:  A/P:   F/u: existing appointment with Clinical Pharmacist on 06/18/20 Time spent face-to-face counseling:  Laverna Peace, PharmD PGY-1 Ambulatory Care Pharmacy Resident 06/11/2020 8:56 AM

## 2020-06-11 ENCOUNTER — Ambulatory Visit: Payer: Self-pay | Admitting: Pharmacist

## 2020-06-12 ENCOUNTER — Other Ambulatory Visit (HOSPITAL_COMMUNITY): Payer: Self-pay

## 2020-06-15 ENCOUNTER — Other Ambulatory Visit: Payer: Self-pay

## 2020-06-15 ENCOUNTER — Ambulatory Visit (HOSPITAL_COMMUNITY)
Admission: RE | Admit: 2020-06-15 | Discharge: 2020-06-15 | Disposition: A | Discharge status: Home / Self Care | Payer: Self-pay | Source: Ambulatory Visit | Attending: Cardiovascular Disease | Admitting: Cardiovascular Disease

## 2020-06-15 DIAGNOSIS — R079 Chest pain, unspecified: Secondary | ICD-10-CM | POA: Insufficient documentation

## 2020-06-15 DIAGNOSIS — I2510 Atherosclerotic heart disease of native coronary artery without angina pectoris: Secondary | ICD-10-CM | POA: Insufficient documentation

## 2020-06-15 MED ORDER — TECHNETIUM TC 99M TETROFOSMIN IV KIT
30.9000 | PACK | Freq: Once | INTRAVENOUS | Status: AC | PRN
  Administered 2020-06-15: 30.9 via INTRAVENOUS
  Filled 2020-06-15: qty 31

## 2020-06-15 MED ORDER — TECHNETIUM TC 99M TETROFOSMIN IV KIT
10.7000 | PACK | Freq: Once | INTRAVENOUS | Status: AC | PRN
  Administered 2020-06-15: 10.7 via INTRAVENOUS
  Filled 2020-06-15: qty 11

## 2020-06-15 MED ORDER — REGADENOSON 0.4 MG/5ML IV SOLN
0.4000 mg | Freq: Once | INTRAVENOUS | Status: AC
  Administered 2020-06-15: 0.4 mg via INTRAVENOUS

## 2020-06-16 ENCOUNTER — Ambulatory Visit (HOSPITAL_COMMUNITY)
Admission: EM | Admit: 2020-06-16 | Discharge: 2020-06-16 | Disposition: A | Discharge status: Home / Self Care | Payer: HRSA Program | Attending: Physician Assistant | Admitting: Physician Assistant

## 2020-06-16 ENCOUNTER — Other Ambulatory Visit: Payer: Self-pay

## 2020-06-16 ENCOUNTER — Encounter (HOSPITAL_COMMUNITY): Payer: Self-pay

## 2020-06-16 DIAGNOSIS — Z7902 Long term (current) use of antithrombotics/antiplatelets: Secondary | ICD-10-CM | POA: Insufficient documentation

## 2020-06-16 DIAGNOSIS — Z20822 Contact with and (suspected) exposure to covid-19: Secondary | ICD-10-CM | POA: Insufficient documentation

## 2020-06-16 DIAGNOSIS — R059 Cough, unspecified: Secondary | ICD-10-CM

## 2020-06-16 DIAGNOSIS — I2510 Atherosclerotic heart disease of native coronary artery without angina pectoris: Secondary | ICD-10-CM | POA: Insufficient documentation

## 2020-06-16 DIAGNOSIS — Z79899 Other long term (current) drug therapy: Secondary | ICD-10-CM | POA: Insufficient documentation

## 2020-06-16 DIAGNOSIS — Z7984 Long term (current) use of oral hypoglycemic drugs: Secondary | ICD-10-CM | POA: Insufficient documentation

## 2020-06-16 DIAGNOSIS — D869 Sarcoidosis, unspecified: Secondary | ICD-10-CM | POA: Insufficient documentation

## 2020-06-16 DIAGNOSIS — R05 Cough: Secondary | ICD-10-CM | POA: Diagnosis present

## 2020-06-16 DIAGNOSIS — I10 Essential (primary) hypertension: Secondary | ICD-10-CM | POA: Insufficient documentation

## 2020-06-16 DIAGNOSIS — E1140 Type 2 diabetes mellitus with diabetic neuropathy, unspecified: Secondary | ICD-10-CM | POA: Diagnosis not present

## 2020-06-16 DIAGNOSIS — H60501 Unspecified acute noninfective otitis externa, right ear: Secondary | ICD-10-CM | POA: Insufficient documentation

## 2020-06-16 DIAGNOSIS — Z7901 Long term (current) use of anticoagulants: Secondary | ICD-10-CM | POA: Insufficient documentation

## 2020-06-16 MED ORDER — ACETAMINOPHEN 500 MG PO TABS: 500.0000 mg | ORAL_TABLET | Freq: Four times a day (QID) | ORAL | 0 refills | Status: DC | PRN

## 2020-06-16 MED ORDER — CEPACOL SORE THROAT 5.4 MG MT LOZG
1.0000 | LOZENGE | OROMUCOSAL | 0 refills | Status: DC | PRN
Start: 2020-06-16 — End: 2022-03-12

## 2020-06-16 MED ORDER — NEOMYCIN-POLYMYXIN-HC 3.5-10000-1 OT SUSP
4.0000 [drp] | Freq: Three times a day (TID) | OTIC | 0 refills | Status: AC
Start: 2020-06-16 — End: 2020-06-26

## 2020-06-16 MED ORDER — BENZONATATE 100 MG PO CAPS
100.0000 mg | ORAL_CAPSULE | Freq: Three times a day (TID) | ORAL | 0 refills | Status: DC
Start: 2020-06-16 — End: 2020-07-14

## 2020-06-16 NOTE — ED Notes (Signed)
Access patient's chart to obtain the orders for the covid testing. The nurse discharge before I could obtain the orders
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
## 2020-06-16 NOTE — ED Triage Notes (Signed)
Patient c/o right ear pain x2 days. States it is starting to cause a scratchy throat.

## 2020-06-16 NOTE — Discharge Instructions (Signed)
Use the eardrops as prescribed Take the cough medicine as prescribed Use the sore throat lozenges as needed every 2 hours  Take Tylenol as needed for pain  Follow-up with your primary care in about 5 days if not improving  If your Covid-19 test is positive, you will receive a phone call from [facility] regarding your results. Negative test results are not called. Both positive and negative results area always visible on MyChart. If you do not have a MyChart account, sign up instructions are in your discharge papers.   Persons who are directed to care for themselves at home may discontinue isolation under the following conditions:   At least 10 days have passed since symptom onset and  At least 24 hours have passed without running a fever (this means without the use of fever-reducing medications) and  Other symptoms have improved.  Persons infected with COVID-19 who never develop symptoms may discontinue isolation and other precautions 10 days after the date of their first positive COVID-19 test

## 2020-06-17 LAB — SARS CORONAVIRUS 2 (TAT 6-24 HRS): SARS Coronavirus 2: NEGATIVE

## 2020-06-18 ENCOUNTER — Telehealth: Payer: Self-pay | Admitting: Family Medicine

## 2020-06-18 ENCOUNTER — Ambulatory Visit: Payer: Self-pay | Admitting: Pharmacist

## 2020-06-18 LAB — MYOCARDIAL PERFUSION IMAGING
LV dias vol: 57 mL (ref 46–106)
LV sys vol: 118 mL
Peak HR: 92 {beats}/min
Rest HR: 61 {beats}/min
SDS: 3
SRS: 0
SSS: 3
TID: 1.11

## 2020-06-18 NOTE — Telephone Encounter (Signed)
I called back the Pt, since she call me on on 06/14/20 after hours, and I was out on 06/15/20, she was inform, from the last time I spoke to her that she was missing the 3 months bank statement form all the bank acct. She has the saving and checking, she supposed be submitted this information 06/11/20, she was given her an extension , still did not brought any documentation yet,i will give until the end of the day 06/18/20 to submitted or I will return her papers by mail to her and she need to re-schedule a new appt with financial and bring all the complete documentation she need to be approve for the CAFA and the OC program, unable to LVM since is not one and she did not answer the call back

## 2020-06-19 ENCOUNTER — Telehealth: Payer: Self-pay | Admitting: Family Medicine

## 2020-06-19 NOTE — Telephone Encounter (Signed)
Pt was sent a letter from financial dept. Inform them, that the application they submitted was incomplete, since they were missing some documentation at the time of the appointment, Pt need to reschedule and resubmit all new papers and application for CAFA and OC, P.S. old documents has been sent back by mail to the Pt and Pt. need to make a new appt. 

## 2020-06-22 ENCOUNTER — Other Ambulatory Visit (HOSPITAL_COMMUNITY): Payer: Self-pay

## 2020-06-22 ENCOUNTER — Ambulatory Visit: Payer: Self-pay | Admitting: Cardiology

## 2020-06-22 ENCOUNTER — Telehealth (HOSPITAL_COMMUNITY): Payer: Self-pay | Admitting: Cardiology

## 2020-06-22 ENCOUNTER — Encounter (HOSPITAL_COMMUNITY): Payer: Self-pay

## 2020-06-22 NOTE — Progress Notes (Unsigned)
Verified appointment "no show" status with [name] at 07:32. 

## 2020-06-22 NOTE — Telephone Encounter (Signed)
Good morning, Just an FYI. We have made several attempts to contact this patient including sending a letter to schedule or reschedule their echocardiogram. We will be removing the patient from the echo WQ.   Pt no showed 06/08/20 and 06/22/20 and cancelled 06/12/20 with 18% NO SHOW rate/LBW   Thank you

## 2020-06-23 ENCOUNTER — Ambulatory Visit (HOSPITAL_COMMUNITY)
Admission: EM | Admit: 2020-06-23 | Discharge: 2020-06-23 | Disposition: A | Discharge status: Home / Self Care | Payer: Self-pay

## 2020-06-23 ENCOUNTER — Ambulatory Visit (INDEPENDENT_AMBULATORY_CARE_PROVIDER_SITE_OTHER): Payer: Self-pay

## 2020-06-23 ENCOUNTER — Other Ambulatory Visit: Payer: Self-pay

## 2020-06-23 ENCOUNTER — Encounter (HOSPITAL_COMMUNITY): Payer: Self-pay

## 2020-06-23 DIAGNOSIS — J069 Acute upper respiratory infection, unspecified: Secondary | ICD-10-CM

## 2020-06-23 DIAGNOSIS — R0602 Shortness of breath: Secondary | ICD-10-CM

## 2020-06-23 DIAGNOSIS — R05 Cough: Secondary | ICD-10-CM

## 2020-06-23 DIAGNOSIS — J019 Acute sinusitis, unspecified: Secondary | ICD-10-CM

## 2020-06-23 MED ORDER — IPRATROPIUM BROMIDE 0.06 % NA SOLN: 2.0000 | Freq: Four times a day (QID) | NASAL | 12 refills | Status: DC | PRN

## 2020-06-23 MED ORDER — AMOXICILLIN-POT CLAVULANATE 875-125 MG PO TABS: 1.0000 | ORAL_TABLET | Freq: Two times a day (BID) | ORAL | 0 refills | Status: AC

## 2020-06-23 NOTE — Discharge Instructions (Addendum)
Complete course of antibiotics.   Nasal spray as provided to help with nasal drainage.    If worsening of symptoms- shortness of breath , difficulty breathign, fevers, chest pain , or otherwise worsening please go to the ER.
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
## 2020-06-23 NOTE — ED Triage Notes (Signed)
Pt c/o productive cough with yellow, green sputum with blood in sputum, nasal congestion, runny nose for approx 4 days. Pt states she took the first COVID injection Monday, 08/09 and was evaluated here on 08/14 for ear pain and was tested for COVID. Pt states since having the COVID test, her nose has been extremely irritated, profuse nasal drainage/secretions.  Pt states she had stress test on Wednesday and then experienced n/v Wednesday evening.  Pt reports cough, SOB has progressively gotten worse. Harsh cough noted, rash under nose observed. Decreased lung sounds on right.

## 2020-06-27 ENCOUNTER — Encounter: Payer: Self-pay | Admitting: Family Medicine

## 2020-07-02 ENCOUNTER — Other Ambulatory Visit: Payer: Self-pay

## 2020-07-02 ENCOUNTER — Ambulatory Visit (HOSPITAL_COMMUNITY): Payer: Self-pay | Attending: Cardiology

## 2020-07-02 DIAGNOSIS — I2510 Atherosclerotic heart disease of native coronary artery without angina pectoris: Secondary | ICD-10-CM

## 2020-07-02 DIAGNOSIS — R079 Chest pain, unspecified: Secondary | ICD-10-CM

## 2020-07-02 LAB — ECHOCARDIOGRAM COMPLETE
Area-P 1/2: 2.75 cm2
S' Lateral: 2.7 cm

## 2020-07-09 ENCOUNTER — Encounter: Payer: Self-pay | Admitting: Family Medicine

## 2020-07-10 ENCOUNTER — Other Ambulatory Visit: Payer: Self-pay | Admitting: Family Medicine

## 2020-07-10 DIAGNOSIS — I214 Non-ST elevation (NSTEMI) myocardial infarction: Secondary | ICD-10-CM

## 2020-07-10 MED ORDER — CLOPIDOGREL BISULFATE 75 MG PO TABS: 75.0000 mg | ORAL_TABLET | Freq: Every day | ORAL | 1 refills | Status: DC

## 2020-07-14 ENCOUNTER — Other Ambulatory Visit: Payer: Self-pay

## 2020-07-14 ENCOUNTER — Ambulatory Visit (HOSPITAL_COMMUNITY)
Admission: EM | Admit: 2020-07-14 | Discharge: 2020-07-14 | Disposition: A | Discharge status: Home / Self Care | Payer: Self-pay | Attending: Urgent Care | Admitting: Urgent Care

## 2020-07-14 ENCOUNTER — Encounter (HOSPITAL_COMMUNITY): Payer: Self-pay

## 2020-07-14 DIAGNOSIS — Z8709 Personal history of other diseases of the respiratory system: Secondary | ICD-10-CM

## 2020-07-14 DIAGNOSIS — H938X3 Other specified disorders of ear, bilateral: Secondary | ICD-10-CM

## 2020-07-14 DIAGNOSIS — J3089 Other allergic rhinitis: Secondary | ICD-10-CM

## 2020-07-14 DIAGNOSIS — J9801 Acute bronchospasm: Secondary | ICD-10-CM

## 2020-07-14 DIAGNOSIS — R059 Cough, unspecified: Secondary | ICD-10-CM

## 2020-07-14 MED ORDER — PROMETHAZINE-DM 6.25-15 MG/5ML PO SYRP: 5.0000 mL | ORAL_SOLUTION | Freq: Every evening | ORAL | 0 refills | Status: DC | PRN

## 2020-07-14 MED ORDER — BENZONATATE 100 MG PO CAPS: 100.0000 mg | ORAL_CAPSULE | Freq: Three times a day (TID) | ORAL | 0 refills | Status: DC | PRN

## 2020-07-14 MED ORDER — CETIRIZINE HCL 10 MG PO TABS: 10.0000 mg | ORAL_TABLET | Freq: Every day | ORAL | 0 refills | Status: DC

## 2020-07-14 MED ORDER — ALBUTEROL SULFATE HFA 108 (90 BASE) MCG/ACT IN AERS: 1.0000 | INHALATION_SPRAY | Freq: Four times a day (QID) | RESPIRATORY_TRACT | 0 refills | Status: DC | PRN

## 2020-07-14 MED ORDER — PREDNISONE 10 MG PO TABS: 20.0000 mg | ORAL_TABLET | Freq: Every day | ORAL | 0 refills | Status: DC

## 2020-07-14 NOTE — ED Triage Notes (Signed)
Pt presents with ear pain returning last night.  Had previous ear infection approx 3-4 weeks ago.  No drainage coming out of ear.  No change in hearing.   Pt also reports ongoing cough x 2-3 mos.  Was dx with bronchitis approx one month ago.  Cough has not gone away.  Periodically coughs up "Rennaker chunks" of blood.  Pt reports groin pain in R side radiating to R buttock and down back of leg.  Pain was intermittent since she had stints placed in January.  Has now become a constant pain.  Has not reached out to her cardiologist regarding the pain.
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
## 2020-07-15 ENCOUNTER — Encounter (HOSPITAL_COMMUNITY): Payer: Self-pay | Admitting: Urgent Care

## 2020-07-19 ENCOUNTER — Other Ambulatory Visit: Payer: Self-pay

## 2020-07-19 ENCOUNTER — Encounter (HOSPITAL_COMMUNITY): Payer: Self-pay | Admitting: Emergency Medicine

## 2020-07-19 ENCOUNTER — Ambulatory Visit (HOSPITAL_COMMUNITY)
Admission: EM | Admit: 2020-07-19 | Discharge: 2020-07-19 | Disposition: A | Discharge status: Home / Self Care | Payer: Self-pay | Attending: Family Medicine | Admitting: Family Medicine

## 2020-07-19 DIAGNOSIS — J209 Acute bronchitis, unspecified: Secondary | ICD-10-CM

## 2020-07-19 MED ORDER — BENZONATATE 100 MG PO CAPS: 100.0000 mg | ORAL_CAPSULE | Freq: Three times a day (TID) | ORAL | 0 refills | Status: DC | PRN

## 2020-07-19 MED ORDER — PROMETHAZINE-DM 6.25-15 MG/5ML PO SYRP: 5.0000 mL | ORAL_SOLUTION | Freq: Every evening | ORAL | 0 refills | Status: DC | PRN

## 2020-07-19 MED ORDER — ALBUTEROL SULFATE HFA 108 (90 BASE) MCG/ACT IN AERS: 1.0000 | INHALATION_SPRAY | Freq: Four times a day (QID) | RESPIRATORY_TRACT | 0 refills | Status: DC | PRN

## 2020-07-19 MED ORDER — PREDNISONE 10 MG PO TABS: 20.0000 mg | ORAL_TABLET | Freq: Every day | ORAL | 0 refills | Status: DC

## 2020-07-19 MED ORDER — CETIRIZINE HCL 10 MG PO TABS: 10.0000 mg | ORAL_TABLET | Freq: Every day | ORAL | 0 refills | Status: DC
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
## 2020-07-19 NOTE — ED Triage Notes (Addendum)
Pt presents with cough, SOB. Pt was seen on 07/14/20 and had RXs sent to [facility] but not able to pick up medication due to cost. Pt needs RXs sent to [facility] and [facility]   Pt states she works at [employer] and the flour flares her bronchitis up, states she needs a work note stating she is unable to work with flour or anything over 15-20 pounds.   [facility] and tested [facility].

## 2020-07-20 MED FILL — PROMETHAZINE W/DM SYRUP: 6.25-15 | 20 days supply | Qty: 100 | Fill #0

## 2020-07-20 MED FILL — predniSONE 10 MG TABS: 10 | 5 days supply | Qty: 10 | Fill #0

## 2020-07-20 MED FILL — !VENTOLIN HFA INHALER: 108 (90 BAS | 25 days supply | Qty: 18 | Fill #0

## 2020-07-20 MED FILL — LISINOPRIL 10 MG TABS: 10 | 30 days supply | Qty: 30 | Fill #2

## 2020-07-20 MED FILL — BENZONATATE 100 MG CAPS: 100 | 10 days supply | Qty: 60 | Fill #0

## 2020-07-23 ENCOUNTER — Telehealth: Payer: Self-pay | Admitting: Cardiology

## 2020-07-23 NOTE — Telephone Encounter (Signed)
Would refer to social worker to help with patient's disability application

## 2020-07-23 NOTE — Telephone Encounter (Signed)
Please advise?   Would you assist to do disability??  Thanks!

## 2020-07-23 NOTE — Telephone Encounter (Signed)
Patient would like to know if she can have assistance getting on disability.

## 2020-07-24 ENCOUNTER — Telehealth: Payer: Self-pay | Admitting: Licensed Clinical Social Worker

## 2020-07-24 NOTE — Telephone Encounter (Signed)
Responded to patient mychart message.

## 2020-07-24 NOTE — Telephone Encounter (Signed)
CSW received referral to assist patient with disability information. CSW attempted to contact patient but unable to leave a message as mailbox full. CSW will attempt again. Lasandra Beech, LCSW, CCSW-MCS [phone]

## 2020-07-28 ENCOUNTER — Encounter (HOSPITAL_COMMUNITY): Payer: Self-pay

## 2020-07-28 ENCOUNTER — Other Ambulatory Visit: Payer: Self-pay

## 2020-07-28 ENCOUNTER — Ambulatory Visit (HOSPITAL_COMMUNITY)
Admission: EM | Admit: 2020-07-28 | Discharge: 2020-07-28 | Disposition: A | Discharge status: Home / Self Care | Payer: Self-pay | Attending: Emergency Medicine | Admitting: Emergency Medicine

## 2020-07-28 ENCOUNTER — Other Ambulatory Visit (HOSPITAL_COMMUNITY): Payer: Self-pay | Admitting: Emergency Medicine

## 2020-07-28 DIAGNOSIS — R053 Chronic cough: Secondary | ICD-10-CM

## 2020-07-28 DIAGNOSIS — R05 Cough: Secondary | ICD-10-CM

## 2020-07-28 MED ORDER — OMEPRAZOLE 40 MG PO CPDR: 40.0000 mg | DELAYED_RELEASE_CAPSULE | Freq: Every day | ORAL | 0 refills | Status: DC

## 2020-07-28 MED ORDER — ALBUTEROL SULFATE HFA 108 (90 BASE) MCG/ACT IN AERS: 1.0000 | INHALATION_SPRAY | Freq: Four times a day (QID) | RESPIRATORY_TRACT | 0 refills | Status: DC | PRN

## 2020-07-28 MED ORDER — ALBUTEROL SULFATE HFA 108 (90 BASE) MCG/ACT IN AERS
1.0000 | INHALATION_SPRAY | Freq: Four times a day (QID) | RESPIRATORY_TRACT | 0 refills | Status: DC | PRN
Start: 2020-07-28 — End: 2020-07-28
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
## 2020-07-28 NOTE — ED Triage Notes (Signed)
Pt c/o non productive cough and 7/10 sharp pain in left mid backx5 days. Pt states she was dx with bronchitis last week. Pt was tested for COVID Mon.

## 2020-07-30 MED FILL — OMEPRAZOLE DR 40 MG CAPSULE: 40 | 30 days supply | Qty: 30 | Fill #0

## 2020-08-06 ENCOUNTER — Other Ambulatory Visit: Payer: Self-pay | Admitting: Family Medicine

## 2020-08-06 DIAGNOSIS — I10 Essential (primary) hypertension: Secondary | ICD-10-CM

## 2020-08-06 MED FILL — OMEPRAZOLE DR 40 MG CAPSULE: 40 | 30 days supply | Qty: 30 | Fill #0

## 2020-08-06 MED FILL — CLOPIDOGREL 75 MG TABLET: 75 | 30 days supply | Qty: 30 | Fill #1
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
## 2020-08-08 ENCOUNTER — Ambulatory Visit: Payer: Self-pay | Admitting: Cardiology

## 2020-08-11 ENCOUNTER — Other Ambulatory Visit: Payer: Self-pay

## 2020-08-12 NOTE — Telephone Encounter (Signed)
Please refill if indicated! 

## 2020-08-18 ENCOUNTER — Other Ambulatory Visit: Payer: Self-pay

## 2020-08-19 MED ORDER — CETIRIZINE HCL 10 MG PO TABS
10.0000 mg | ORAL_TABLET | Freq: Every day | ORAL | 0 refills | Status: DC
Start: 2020-08-19 — End: 2021-02-14
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
## 2020-08-27 ENCOUNTER — Ambulatory Visit: Payer: Self-pay | Admitting: Cardiology

## 2020-08-29 ENCOUNTER — Telehealth: Payer: Self-pay | Admitting: Cardiology

## 2020-08-29 ENCOUNTER — Encounter: Payer: Self-pay | Admitting: Family Medicine

## 2020-08-29 ENCOUNTER — Other Ambulatory Visit: Payer: Self-pay | Admitting: Cardiology

## 2020-08-29 DIAGNOSIS — I214 Non-ST elevation (NSTEMI) myocardial infarction: Secondary | ICD-10-CM

## 2020-08-29 DIAGNOSIS — I10 Essential (primary) hypertension: Secondary | ICD-10-CM

## 2020-08-29 MED ORDER — CLOPIDOGREL BISULFATE 75 MG PO TABS: 75.0000 mg | ORAL_TABLET | Freq: Every day | ORAL | 1 refills | Status: DC

## 2020-08-29 MED ORDER — LISINOPRIL 20 MG PO TABS: 20.0000 mg | ORAL_TABLET | Freq: Every day | ORAL | 1 refills | Status: DC

## 2020-08-29 MED ORDER — CARVEDILOL 25 MG PO TABS: 25.0000 mg | ORAL_TABLET | Freq: Two times a day (BID) | ORAL | 1 refills | Status: DC

## 2020-08-29 MED ORDER — ROSUVASTATIN CALCIUM 40 MG PO TABS: 40.0000 mg | ORAL_TABLET | Freq: Every day | ORAL | 1 refills | Status: DC

## 2020-08-29 MED FILL — CARVEDILOL 25 MG TABLET: 25 | 30 days supply | Qty: 60 | Fill #0

## 2020-08-29 MED FILL — ROSUVASTATIN CALCIUM 40 MG: 40 | 30 days supply | Qty: 30 | Fill #0

## 2020-08-29 MED FILL — LISINOPRIL 20 MG TABLET: 20 | 30 days supply | Qty: 30 | Fill #0

## 2020-08-29 NOTE — Telephone Encounter (Signed)
Per MyChart patient schedule message received:   You  Deroos, Arlana Pouch "Amil Amen [place]" 1 minute ago (12:48 PM)   Hi Amil Amen - We attempted to call you but you don't have a voicemail set up. I will send your prescriptions to the [facility] pharmacy (carvedilol, plavix, lisinopril, crestor)  I will send a message to our clinical social work team also to see what assistance they can provide. Our care guide Amy has also attempted to reach you about assisting with setting up Medicaid.  This MyChart message has not been read.  Burt Knack S  Cv Div Nl Triage 1 hour ago (11:05 AM)   Patient is requesting samples    Message text   Silverstein, Mariza Bourget "Amil Amen [place]"  Burt Knack S 6 hours ago (6:31 AM)   i need all my meds. plazil, heart pills cardevil, my pills muscule relaxer, and my Mote heart pills don't have money need get from yall phracmary.   Burt Knack S  Fariss, Bettyjean Stefanski "Jaydn [place]" 2 days ago   Good Morning,   I am so sorry for your loss. I have canceled your appointment for you and I have you rescheduled for 09/12/20 @ 3:00 pm. Let us know if this does not work for you.    Fabre, Arlana Pouch "Amil Amen [place]"  Patient Appointment Schedule Request Pool 2 days ago   Appointment Request From: Roel Cluck  With Provider: Hinderliter Ishikawa, MD [facility]]  Preferred Date Range: Any  Preferred Times: Any Time  Reason for visit: Office Visit  Comments: i have death in family

## 2020-08-31 MED FILL — CLOPIDOGREL 75 MG TABLET: 75 | 30 days supply | Qty: 30 | Fill #2

## 2020-09-04 MED FILL — METHOCARBAMOL 500 MG TABS: 500 | 20 days supply | Qty: 60 | Fill #1

## 2020-09-05 ENCOUNTER — Telehealth: Payer: Self-pay | Admitting: Licensed Clinical Social Worker

## 2020-09-05 MED FILL — LISINOPRIL 20 MG TABLET: 20 | 30 days supply | Qty: 30 | Fill #0

## 2020-09-05 MED FILL — CARVEDILOL 25 MG TABLET: 25 | 30 days supply | Qty: 60 | Fill #0

## 2020-09-05 MED FILL — ROSUVASTATIN CALCIUM 40 MG: 40 | 30 days supply | Qty: 30 | Fill #0

## 2020-09-05 NOTE — Telephone Encounter (Signed)
CSW able to confirm pt has not picked up her medications at this time. Able to assess copay owed and CSW team will cover cost at [facility]. Pt remains without an active voicemail- send myChart message to her as she has contacted office that way. Let her know we could assist with medications and that I wanted to ensure that she would be able to get to her upcoming cardiology appointment. Will continue to try and make contact with pt.  Octavio Graves, MSW, LCSW [facility] Clinical Social Work

## 2020-09-12 ENCOUNTER — Ambulatory Visit: Payer: Self-pay | Admitting: Cardiology

## 2020-09-26 ENCOUNTER — Ambulatory Visit: Payer: Self-pay | Admitting: Family Medicine

## 2020-10-01 ENCOUNTER — Telehealth: Payer: Self-pay | Admitting: Licensed Clinical Social Worker

## 2020-10-01 NOTE — Telephone Encounter (Addendum)
CSW received MyChart Message from pt requesting ride to upcoming appointments. Offered to send pt referral to [employer]. Inquired in a MyChart message back to pt if she has a working telephone contact- pt phone still unable to receive voicemails at this time per pre-recorded message- does not ring when contacted. CSW sent HIPAA compliant text message to listed number [phone]. CSW will continue to try and reach pt.   Octavio Graves, MSW, LCSW [facility] Clinical Social Work

## 2020-10-03 ENCOUNTER — Telehealth: Payer: Self-pay | Admitting: Licensed Clinical Social Worker

## 2020-10-03 NOTE — Telephone Encounter (Signed)
CSW received additional MyChart message w/ updated pt number. Pt provided [phone] option #1 then option #327, when dialed was not able to dial above options. Did not leave voicemail. CSW messaged pt back. Per home address pt current address is [address] who's number is listed as [phone], attempted this number and unable to leave a message as lines are currently down.   Octavio Graves, MSW, LCSW [facility] Clinical Social Work

## 2020-10-04 ENCOUNTER — Telehealth: Payer: Self-pay | Admitting: Licensed Clinical Social Worker

## 2020-10-04 NOTE — Telephone Encounter (Signed)
CSW received a myChart message from pt routed from triage requesting assistance for disability. I let triage know that we have been attempting to reach pt for multiple months to assist her with no ability to get her on the phone.   I sent pt a MyChart message back letting her know that this morning I was able to get through the phones at the [facility] at [phone] and dialed #1 then #327 as instructed by her previous MyChart message w/ no answer and no way to leave a message. I provided pt with my phone number again so that she can reach out directly to me.   Octavio Graves, MSW, LCSW [facility] Clinical Social Work

## 2020-10-09 ENCOUNTER — Telehealth: Payer: Self-pay | Admitting: Cardiology

## 2020-10-09 ENCOUNTER — Telehealth: Payer: Self-pay | Admitting: Licensed Clinical Social Worker

## 2020-10-09 ENCOUNTER — Ambulatory Visit: Payer: Self-pay | Admitting: Cardiology

## 2020-10-09 DIAGNOSIS — I10 Essential (primary) hypertension: Secondary | ICD-10-CM

## 2020-10-09 DIAGNOSIS — I214 Non-ST elevation (NSTEMI) myocardial infarction: Secondary | ICD-10-CM

## 2020-10-09 NOTE — Telephone Encounter (Signed)
CSW received additional myChart message from pt stating that the phone at the hotel is broken. I have provided my number again and inquired about reaching her through her two listed contacts Windy Fast and Lillia Abed).  CSW will continue to try and reach pt as able, limited by lack of working contact number at this time.   Octavio Graves, MSW, LCSW [facility]Vascular Care Navigation  [phone]

## 2020-10-09 NOTE — Telephone Encounter (Signed)
 *  STAT* If patient is at the pharmacy, call can be transferred to refill team.   1. Which medications need to be refilled? (please list name of each medication and dose if known)   carvedilol (COREG) 25 MG tablet clopidogrel (PLAVIX) 75 MG tablet lisinopril (ZESTRIL) 20 MG tablet rosuvastatin (CRESTOR) 40 MG tablet  2. Which pharmacy/location (including street and city if local pharmacy) is medication to be sent to?  [facility] [place], [place] - [address]  3. Do they need a 30 day or 90 day supply? 30

## 2020-10-10 ENCOUNTER — Telehealth: Payer: Self-pay | Admitting: Licensed Clinical Social Worker

## 2020-10-10 MED FILL — ROSUVASTATIN CALCIUM 40 MG: 40 | 30 days supply | Qty: 30 | Fill #1

## 2020-10-10 MED FILL — CARVEDILOL 25 MG TABLET: 25 | 30 days supply | Qty: 60 | Fill #1

## 2020-10-10 MED FILL — CLOPIDOGREL 75 MG TABLET: 75 | 30 days supply | Qty: 30 | Fill #3

## 2020-10-10 MED FILL — LISINOPRIL 20 MG TABLET: 20 | 30 days supply | Qty: 30 | Fill #1

## 2020-10-10 NOTE — Progress Notes (Signed)
Heart and Vascular Care Navigation  10/10/2020  [name] 11/26/1965 [id number]  Reason for Referral:  Pt requesting assistance w/ cost of medications/disability/Medicaid and housing assistance.                                                                                                    Assessment:  CSW received a call back from pt regarding the above concerns. Introduced self, role, reason for call. Pt confirmed current address, provided hotel number as best contact number and updated her brothers number as a back up contact. Pt has been staying at the [facility] for the past four years. She currently is unemployed, has challenges with affording all basic needs. She has been relying on assistance from friends. She states she has applied for Medicaid but never heard back and could not confirm when she had last spoken with DSS or when things were submitted. She states multiple times that she is interested in disability.   Pt has been missing her appointments w/ Dr. Bjorn Cook due to lack of transportation and difficulty w/ phone. I offered that we could get her referred to [facility]. Pt confirmed she is okay with that and also is able to received emails at the following address: [email or web address]. Pt expresses that she is in need of some medication refills, she will be able to get a ride to pick them up from [facility] pharmacy tomorrow.                               HRT/VAS Care Coordination    Patients Home Cardiology Office [facility]   Outpatient Care Team Social Worker   Social Worker Name: [name], CSW, [facility]   Patient Current Insurance Coverage Self-Pay   Patient Has Concern With Paying Medical Bills No   Does Patient Have Prescription Coverage? No   Patient Prescription Assistance Programs Heart Failure Fund      Social History:                                                                              SDOH Screenings   Alcohol Screen:   . Last Alcohol Screening Score (AUDIT): Not on file  Depression (PHQ2-9): Low Risk   . PHQ-2 Score: 0  Financial Resource Strain: High Risk  . Difficulty of Paying Living Expenses: Very hard  Food Insecurity:   . Worried About Programme researcher, broadcasting/film/video in the Last Year: Not on file  . Ran Out of Food in the Last Year: Not on file  Housing: Medium Risk  . Last Housing Risk Score: 1  Physical Activity:   . Days of Exercise per Week: Not on file  . Minutes of Exercise  per Session: Not on file  Social Connections:   . Frequency of Communication with Friends and Family: Not on file  . Frequency of Social Gatherings with Friends and Family: Not on file  . Attends Religious Services: Not on file  . Active Member of Clubs or Organizations: Not on file  . Attends Banker Meetings: Not on file  . Marital Status: Not on file  Stress:   . Feeling of Stress : Not on file  Tobacco Use: Low Risk   . Smoking Tobacco Use: Never Smoker  . Smokeless Tobacco Use: Never Used  Transportation Needs: Unmet Transportation Needs  . Lack of Transportation (Medical): Yes  . Lack of Transportation (Non-Medical): Yes    SDOH Interventions: Financial Resources:  Corporate treasurer Interventions: Artist, [id number] Referral; Disability referral to [employer] Insecurity:   Pt did not mention at this time if this was a need; will refer to [facility] for food stamps.  Housing Insecurity:  Housing Interventions: [id number] Referral  Transportation:   Transportation Interventions: Retail banker    Other Care Navigation Interventions:     Provided Pharmacy assistance resources  Heart Failure Fund will assist w/ copayments at [employer]   Follow-up plan:   CSW will send referrals for various needs via [place] w/ pt permission. I will send transportation referral to [facility]. Care Navigation team will assist w/  medication copayments. I will meet with pt if she comes to her appointment on 12/28 to go over any additional needs/provide pt w/ working phone.

## 2020-10-10 NOTE — Telephone Encounter (Signed)
CSW attempted again to f/u with pt, dialed hotel at [phone]. Was able to leave voicemail for room #327. HIPAA compliant message left requesting a call back to this writer. Pt still has not indicated another number or contact person we can contact.   Octavio Graves, MSW, LCSW [facility] Health Heart/Vascular Care Navigation  [phone]

## 2020-10-11 ENCOUNTER — Telehealth: Payer: Self-pay | Admitting: Licensed Clinical Social Worker

## 2020-10-11 ENCOUNTER — Other Ambulatory Visit: Payer: Self-pay | Admitting: Family Medicine

## 2020-10-11 ENCOUNTER — Other Ambulatory Visit: Payer: Self-pay | Admitting: Cardiology

## 2020-10-11 DIAGNOSIS — M25551 Pain in right hip: Secondary | ICD-10-CM

## 2020-10-11 DIAGNOSIS — M25552 Pain in left hip: Secondary | ICD-10-CM

## 2020-10-11 MED ORDER — LISINOPRIL 20 MG PO TABS: 20.0000 mg | ORAL_TABLET | Freq: Every day | ORAL | 5 refills | Status: DC

## 2020-10-11 MED ORDER — CARVEDILOL 25 MG PO TABS: 25.0000 mg | ORAL_TABLET | Freq: Two times a day (BID) | ORAL | 5 refills | Status: DC

## 2020-10-11 MED ORDER — ROSUVASTATIN CALCIUM 40 MG PO TABS: 40.0000 mg | ORAL_TABLET | Freq: Every day | ORAL | 5 refills | Status: DC

## 2020-10-11 MED ORDER — CLOPIDOGREL BISULFATE 75 MG PO TABS: 75.0000 mg | ORAL_TABLET | Freq: Every day | ORAL | 5 refills | Status: DC

## 2020-10-11 NOTE — Telephone Encounter (Signed)
CSW sent referral to [facility].  Email also sent to pt with the following resources: [employer] of food pantries along w/ COVID hours, alternate transportation resources and assistance programs in community. CSW explained that we would like to assist w/ referrals to local community partners that can help with her applications for disability and food stamps as well as Medicaid and [facility]. Provided [facility] Transportation number for her to call and go over the waiver as they have had some difficulty getting in touch with her this morning to arrange rides. I let pt know also in e-mail that we were able to assist w/ cardiology medications and she can pick them up this afternoon as we discussed yesterday she would be able to get a ride for this trip. Will f/u if I have not heard back from pt as able.   Octavio Graves, MSW, LCSW [facility] Health Heart/Vascular Care Navigation  [phone]

## 2020-10-11 NOTE — Telephone Encounter (Signed)
Requested medication (s) are due for refill today  Yes    Last ordered  05/28/20  #60 with 1 refill  Requested medication (s) are on the active medication list  Yes  Future visit scheduled No  LOV 05/21/20   Note to clinic-This medication is not delegated for PEC refills.Routing to pcp for consideration.    Requested Prescriptions  Pending Prescriptions Disp Refills   methocarbamol (ROBAXIN) 500 MG tablet [Pharmacy Med Name: METHOCARBAMOL 500 MG TABS 500 Tablet] 60 tablet 1    Sig: TAKE 1 TABLET (500 MG TOTAL) BY MOUTH EVERY 8 (EIGHT) HOURS AS NEEDED FOR MUSCLE SPASMS.      Not Delegated - Analgesics:  Muscle Relaxants Failed - 10/11/2020  2:17 PM      Failed - This refill cannot be delegated      Passed - Valid encounter within last 6 months    Recent Outpatient Visits           4 months ago Type 2 diabetes mellitus with other circulatory complication, without long-term current use of insulin [facility])   [facility] [place], Jeannett Senior L, RPH-CPP   4 months ago Encounter for medication review and counseling   [facility] [place], Jeannett Senior L, RPH-CPP   4 months ago Type 2 diabetes mellitus with other circulatory complication, without long-term current use of insulin (HCC)   [facility] [place], Odette Horns, MD   7 months ago Type 2 diabetes mellitus with other circulatory complication, without long-term current use of insulin (HCC)   [facility] [place], [place], MD   11 months ago Type 2 diabetes mellitus with other circulatory complication, without long-term current use of insulin [facility])   [place] [facility] Hoy Register, MD       Future Appointments             In 2 weeks Shakoor Ishikawa, MD [facility], [facility]

## 2020-10-12 ENCOUNTER — Telehealth: Payer: Self-pay | Admitting: Licensed Clinical Social Worker

## 2020-10-12 NOTE — Telephone Encounter (Signed)
CSW confirmed pt had picked up medications at [facility] [employer] Attempted to reach pt at hotel room [phone], left message. Had sent email to pt at confirmed email address. Pt also has my number, would ideally like to reach pt to assist w/ connection to transportation to get to appt/Heartcare office to speak with this Clinical research associate and complete referrals for assistance programs.    Octavio Graves, MSW, LCSW [facility] Health Heart/Vascular Care Navigation  [phone]

## 2020-10-15 ENCOUNTER — Telehealth: Payer: Self-pay | Admitting: Licensed Clinical Social Worker

## 2020-10-15 NOTE — Telephone Encounter (Signed)
CSW received an additional message to pt via myChart in basket as have been unable to reach via telephone or email. Provided my number again as well as Transportation Services to get her enrolled in services to be picked up for her upcoming appointment.   Octavio Graves, MSW, LCSW [facility] Health Heart/Vascular Care Navigation  [phone]

## 2020-10-16 ENCOUNTER — Telehealth: Payer: Self-pay | Admitting: Licensed Clinical Social Worker

## 2020-10-16 NOTE — Telephone Encounter (Signed)
Additional attempt to reach pt via telephone and via email.  Again provided my information and the number for [employer] to ensure pt could have assistance w/ transportation to upcoming appt on 12/28.   Octavio Graves, MSW, LCSW [facility] Health Heart/Vascular Care Navigation  [phone]

## 2020-10-17 ENCOUNTER — Telehealth: Payer: Self-pay | Admitting: Licensed Clinical Social Worker

## 2020-10-17 ENCOUNTER — Telehealth: Payer: Self-pay | Admitting: Family Medicine

## 2020-10-17 NOTE — Telephone Encounter (Signed)
   [place] Oborn Goeden DOB: 1966-06-04 MRN: [id number]   RIDER WAIVER AND RELEASE OF LIABILITY  For purposes of improving physical access to our facilities, [facility] is pleased to partner with third parties to provide [facility] patients or other authorized individuals the option of convenient, on-demand ground transportation services (the [employer]") through use of the technology service that enables users to request on-demand ground transportation from independent third-party providers.  By opting to use and accept these [employer], I, the undersigned, hereby agree on behalf of myself, and on behalf of any minor child using the [employer] for whom I am the parent or legal guardian, as follows:  1. Science writer provided to me are provided by independent third-party transportation providers who are not [employer] or employees and who are unaffiliated with [employer]. 2. [employer] is neither a transportation carrier nor a common or public carrier. 3. [facility] has no control over the quality or safety of the transportation that occurs as a result of the [employer]. 4. [facility] cannot guarantee that any third-party transportation provider will complete any arranged transportation service. 5. [facility] makes no representation, warranty, or guarantee regarding the reliability, timeliness, quality, safety, suitability, or availability of any of the Transport Services or that they will be error free. 6. I fully understand that traveling by vehicle involves risks and dangers of serious bodily injury, including permanent disability, paralysis, and death. I agree, on behalf of myself and on behalf of any minor child using the Transport Services for whom I am the parent or legal guardian, that the entire risk arising out of my use of the [employer] remains solely with me, to the maximum extent permitted under applicable law. 7. The [employer] are provided "as is" and "as available." [facility] disclaims all representations and warranties, express, implied or statutory, not expressly set out in these terms, including the implied warranties of merchantability and fitness for a particular purpose. 8. I hereby waive and release [place], its agents, employees, officers, directors, representatives, insurers, attorneys, assigns, successors, subsidiaries, and affiliates from any and all past, present, or future claims, demands, liabilities, actions, causes of action, or suits of any kind directly or indirectly arising from acceptance and use of the [employer]. 9. I further waive and release  and its affiliates from all present and future liability and responsibility for any injury or death to persons or damages to property caused by or related to the use of the [employer]. 10. I have read this Waiver and Release of Liability, and I understand the terms used in it and their legal significance. This Waiver is freely and voluntarily given with the understanding that my right (as well as the right of any minor child for whom I am the parent or legal guardian using the [employer]) to legal recourse against  in connection with the [employer] is knowingly surrendered in return for use of these services.   I attest that I read the consent document to Roel Cluck, gave Ms. Tumlin the opportunity to ask questions and answered the questions asked (if any). I affirm that Murlean Seelye [place]i then provided consent for she's participation in this program.     Evette Doffing

## 2020-10-17 NOTE — Telephone Encounter (Signed)
Pt again has sent MyChart message requesting assistance w/ Medicaid and Disability. Despite multiple efforts by this Clinical research associate to reach pt via MyChart, phone and email, as requested by pt, I have been unable to get pt enrolled w/ transportation services which is needed to get pt into office to assist her with various requests.   Octavio Graves, MSW, LCSW [facility] Health Heart/Vascular Care Navigation  [phone]

## 2020-10-17 NOTE — Telephone Encounter (Signed)
Ride scheduled for pt appt at [facility] for 2/3 at 10:30am, pick up between 9:45 and 10:00am.  Pt has Transportation Services number for future appts.   Octavio Graves, MSW, LCSW [facility] Health Heart/Vascular Care Navigation  [phone]

## 2020-10-17 NOTE — Telephone Encounter (Signed)
CSW was able to reach pt brother Lillia Abed who shared that he had just gotten off the phone w/ pt before she headed to work. The only number he has for her currently is the extended stay motel which we have been unsuccessful in reaching pt. Pt brother took my number, name, and transportation services number. When I asked if pt brother would be able to perhaps connect [place] with pt the next time he sees her he states "[place]," and did not offer any further assistance before the end of our call.   Shortly after CSW hung up w/ pt brother, the pt then called this Clinical research associate. CSW was able to three way call w/ Transportation Services who was able to enroll pt into Transportation w/ [employer]. We scheduled her ride for 12/28 appointment at [facility] cardiology office. Pt then indicated that she had an appointment at [facility]. We three way called w/ that office and were able to get her scheduled for Feb 3rd at 10:30 w/ Dr. Alvis Lemmings. CSW will schedule that ride on behalf of pt.  Pt expressed that she is near eviction/having challenges w/ her current housing situation. I provided her w/ [facility] for Housing and Community Studies eviction mediation services number directly and encouraged her to provide them w/ her hotel number and her brother's number and continue to call if she doesn't get them as it is difficult for providers to reach pt.  Pt also understands that this writer can assist her with assistance program paperwork and referral to community partners if we can get her into the office. Pt declined to schedule a specific time next week to meet but this writer requested she find a time and call me back before the end of the week so we can assist her with the above challenges and connect her with a working phone.   CSW will f/u with pt/pt brother to schedule an appointment with me next week before the end of this week.   Octavio Graves, MSW, LCSW [facility] Health Heart/Vascular Care Navigation   [phone]

## 2020-10-18 ENCOUNTER — Telehealth: Payer: Self-pay | Admitting: Licensed Clinical Social Worker

## 2020-10-18 NOTE — Telephone Encounter (Signed)
Pt sent a MyChart message to triage indicating that she is coming next week. Pt did not confirm a date or time that she could come next week with this Clinical research associate. CSW called extended stay and could not reach pt via room phone, was able to reach pt brother and request if he speaks with her to tell her to call me so we can arrange a date and time for her to come and meet with me. CSW will re-attempt tomorrow to reach pt via telephone as able.   Octavio Graves, MSW, LCSW [facility] Health Heart/Vascular Care Navigation  [phone]

## 2020-10-19 ENCOUNTER — Telehealth: Payer: Self-pay | Admitting: Licensed Clinical Social Worker

## 2020-10-19 NOTE — Telephone Encounter (Signed)
Received a call from pt, she plans on going to [facility] DSS this morning, she then plans to stop by the cardiology office to start some assistance applications with this Clinical research associate.  Pt shared she was able to get in touch with [facility] for Housing and Community Studies and found them to be very helpful so far.   CSW has gathered resources and is ready should pt make it to office today to meet with me.   Octavio Graves, MSW, LCSW [facility] Health Heart/Vascular Care Navigation  [phone]

## 2020-10-19 NOTE — Progress Notes (Signed)
CSW met with pt at clinic today, she was able to take the bus to her appointment w/ DSS this morning. She shares that she applied for multiple assistance programs this morning w/ the worker there including Medicaid, food stamps and rental/utility assistance. She is supposed to return this afternoon to collect a letter to give to [facility] for Housing and [employer]. CSW and pt reviewed assistance application through [facility]. She will take it and complete form and collect paperwork. She is still working part time at [employer] but feels she is not able to continue this and would like to apply for disability. CSW explained that I leave that evaluation up to her medical team. If they do determine that her health does not allow her to work then I can assist her with a referral for disability application.   Pt mentioned that a company that she referred to as "[employer]" had been paying her medical bills. I requested that she bring one of those statements to her appointment on the 28th with Dr. Gardiner Rhyme. Pt provided with [employer] and we went over the assistance related to the phone that clinic can provide short term. Pt states understanding. I added her new number to the chart and we called together to schedule a ride back to DSS along w/ updating her number with them.   Pt provided with my card, Transportation Services card and a bus pass to get home after DSS appointment.   Westley Hummer, MSW, [place]  [phone]

## 2020-10-24 ENCOUNTER — Telehealth: Payer: Self-pay | Admitting: Licensed Clinical Social Worker

## 2020-10-24 NOTE — Telephone Encounter (Signed)
Ride requested for pt through [facility].  Appt 12/28 at 4:00pm, pick up requested for 3:15pm.   Octavio Graves, MSW, LCSW [facility] Health Heart/Vascular Care Navigation  [phone]
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
## 2020-10-29 ENCOUNTER — Telehealth: Payer: Self-pay | Admitting: Licensed Clinical Social Worker

## 2020-10-29 NOTE — Telephone Encounter (Signed)
CSW called to remind pt of appt tomorrow, she already has ride scheduled.  I inquired if she had heard anything back from DSS and she states she was able to be approved for food stamps and has started receiving those. She also was approved for rental assistance at the [employer] and [place] through March. CSW will see pt tomorrow afternoon for her appointment.   Octavio Graves, MSW, LCSW [facility] Health Heart/Vascular Care Navigation  [phone]

## 2020-10-30 ENCOUNTER — Encounter: Payer: Self-pay | Admitting: Cardiology

## 2020-10-30 ENCOUNTER — Other Ambulatory Visit: Payer: Self-pay | Admitting: Cardiology

## 2020-10-30 ENCOUNTER — Other Ambulatory Visit: Payer: Self-pay

## 2020-10-30 ENCOUNTER — Ambulatory Visit (INDEPENDENT_AMBULATORY_CARE_PROVIDER_SITE_OTHER): Payer: Self-pay | Admitting: Cardiology

## 2020-10-30 VITALS — BP 142/70 | HR 74 | Ht 65.0 in | Wt 212.0 lb

## 2020-10-30 DIAGNOSIS — I10 Essential (primary) hypertension: Secondary | ICD-10-CM

## 2020-10-30 DIAGNOSIS — E785 Hyperlipidemia, unspecified: Secondary | ICD-10-CM

## 2020-10-30 DIAGNOSIS — I2510 Atherosclerotic heart disease of native coronary artery without angina pectoris: Secondary | ICD-10-CM

## 2020-10-30 DIAGNOSIS — K219 Gastro-esophageal reflux disease without esophagitis: Secondary | ICD-10-CM

## 2020-10-30 MED ORDER — NITROGLYCERIN 0.4 MG SL SUBL: 0.4000 mg | SUBLINGUAL_TABLET | SUBLINGUAL | 3 refills | Status: DC | PRN

## 2020-10-30 MED ORDER — FAMOTIDINE 10 MG PO TABS: 10.0000 mg | ORAL_TABLET | Freq: Two times a day (BID) | ORAL | 3 refills | Status: DC

## 2020-10-30 NOTE — Patient Instructions (Signed)
Medication Instructions:  STOP omeprazole START famotidine (Pepcid) 10 mg two times daily  *If you need a refill on your cardiac medications before your next appointment, please call your pharmacy*   Lab Work: BMET, Lipid today  If you have labs (blood work) drawn today and your tests are completely normal, you will receive your results only by: Marland Kitchen MyChart Message (if you have MyChart) OR . A paper copy in the mail If you have any lab test that is abnormal or we need to change your treatment, we will call you to review the results.   Follow-Up: At [facility], you and your health needs are our priority.  As part of our continuing mission to provide you with exceptional heart care, we have created designated Provider Care Teams.  These Care Teams include your primary Cardiologist (physician) and Advanced Practice Providers (APPs -  Physician Assistants and Nurse Practitioners) who all work together to provide you with the care you need, when you need it.  We recommend signing up for the patient portal called "MyChart".  Sign up information is provided on this After Visit Summary.  MyChart is used to connect with patients for Virtual Visits (Telemedicine).  Patients are able to view lab/test results, encounter notes, upcoming appointments, etc.  Non-urgent messages can be sent to your provider as well.   To learn more about what you can do with MyChart, go to [email or web address].    Your next appointment:   3 month(s)  The format for your next appointment:   In Person  Provider:   Epifanio Lesches, MD

## 2020-10-31 ENCOUNTER — Telehealth: Payer: Self-pay | Admitting: Licensed Clinical Social Worker

## 2020-10-31 LAB — BASIC METABOLIC PANEL
BUN/Creatinine Ratio: 20 (ref 9–23)
BUN: 16 mg/dL (ref 6–24)
CO2: 21 mmol/L (ref 20–29)
Calcium: 9.5 mg/dL (ref 8.7–10.2)
Chloride: 105 mmol/L (ref 96–106)
Creatinine, Ser: 0.82 mg/dL (ref 0.57–1.00)
GFR calc Af Amer: 93 mL/min/{1.73_m2} (ref >59)
GFR calc non Af Amer: 80 mL/min/{1.73_m2} (ref >59)
Glucose: 235 mg/dL — ABNORMAL HIGH (ref 65–99)
Potassium: 4.1 mmol/L (ref 3.5–5.2)
Sodium: 142 mmol/L (ref 134–144)

## 2020-10-31 LAB — LIPID PANEL
Chol/HDL Ratio: 2.9 ratio (ref 0.0–4.4)
Cholesterol, Total: 137 mg/dL (ref 100–199)
HDL: 47 mg/dL (ref >39)
LDL Chol Calc (NIH): 52 mg/dL (ref 0–99)
Triglycerides: 238 mg/dL — ABNORMAL HIGH (ref 0–149)
VLDL Cholesterol Cal: 38 mg/dL (ref 5–40)

## 2020-10-31 MED FILL — NITROGLYCERIN 0.4 MG TAB SL: 0.4 | 25 days supply | Qty: 25 | Fill #0

## 2020-10-31 NOTE — Progress Notes (Signed)
CSW has confirmed pt has coverage under [employer] that began in August 2021 and is currently active for 2022. Aram Beecham in [place] billing has updated pt account with this information, I also have made Hayley, RN w/ Dr. Bjorn Pippin aware of this update.   CSW sent referral for disability to [facility], pt was made aware yesterday that they would call her to complete referral if eligible.   Octavio Graves, MSW, LCSW [facility] Health Heart/Vascular Care Navigation  [phone]

## 2020-10-31 NOTE — Telephone Encounter (Signed)
CSW met with pt during appointment on 12/28. Pt provided CSW with statement from [facility]. [place] called [phone], was able to confirm that per their representative pt has coverage under [facility], Account [id number], Subscriber/Member ID #[id number]. CSW will provide this to billing department to see if they can verify benefits.   CSW also received request from pt to assist with disability application.  Pt signed paperwork for [facility], will send pt referral to [facility] at [email or web address] .[place], MSW, [place]  [phone]

## 2020-11-01 ENCOUNTER — Telehealth: Payer: Self-pay | Admitting: Licensed Clinical Social Worker

## 2020-11-01 NOTE — Telephone Encounter (Signed)
CSW contacted pt to let her know that her [employer] coverage is active but that she may want to call member services to better understand what the plan actually covers and if she has any associated costs with coverage and provided her with member services # [phone]. I also let her know again that [facility] would be reaching out to her to review eligibility for disability application. I shared that if she should have any additional questions or concerns that our office is closed for the holiday tomorrow but we reopen and I am available next week.  Octavio Graves, MSW, LCSW [facility] Health Heart/Vascular Care Navigation  [phone]

## 2020-11-06 ENCOUNTER — Telehealth: Payer: Self-pay | Admitting: Licensed Clinical Social Worker

## 2020-11-06 NOTE — Telephone Encounter (Signed)
CSW spoke with the [facility], they had been able to touch base with the pt once but haven't been able to be in contact with her since. They are trying to get in touch with her to complete disability referral. CSW attempted to reach pt too and was unable to reach her or leave a message due to voicemail being full at [phone]. I sent pt a text message requesting she get back in touch with me so she can proceed w/ referral.  Will reattempt again tomorrow to reach pt.   Octavio Graves, MSW, LCSW [facility] Health Heart/Vascular Care Navigation  [phone]

## 2020-11-07 ENCOUNTER — Telehealth: Payer: Self-pay | Admitting: Licensed Clinical Social Worker

## 2020-11-07 NOTE — Telephone Encounter (Signed)
Called pt again at [phone]. Pt phone goes straight to voicemail, voicemail full.  Will re-attempt again later as able.    Octavio Graves, MSW, LCSW [facility] Health Heart/Vascular Care Navigation  [phone]

## 2020-11-08 ENCOUNTER — Telehealth: Payer: Self-pay | Admitting: Licensed Clinical Social Worker

## 2020-11-08 NOTE — Telephone Encounter (Signed)
CSW attempted again to reach pt via telephone, no answer and voicemail full at [phone].  Also sent text message to pt requesting she reach out to [place] when able.   Octavio Graves, MSW, LCSW [facility] Health Heart/Vascular Care Navigation  [phone]

## 2020-11-09 ENCOUNTER — Telehealth: Payer: Self-pay | Admitting: Licensed Clinical Social Worker

## 2020-11-09 NOTE — Telephone Encounter (Signed)
CSW able to make contact w/ pt via text message at [phone].  Pt states she will reach out to [facility] today- provided her with caseworker [place]'s number at [phone] ext [phone].   Octavio Graves, MSW, LCSW [facility] Health Heart/Vascular Care Navigation  [phone]

## 2020-11-12 ENCOUNTER — Telehealth: Payer: Self-pay | Admitting: Licensed Clinical Social Worker

## 2020-11-12 NOTE — Telephone Encounter (Signed)
CSW received text from pt asking for refills of medications. CSW explained that per her last visit we could verify that she had coverage under [facility]. Pt would also need to contact pharmacy to request refills of medications she is missing. Provided pt w/ number for [facility] and also let her know that [facility] is still waiting on her to get in contact with them to complete disability screening/application.   Octavio Graves, MSW, LCSW [facility] Health Heart/Vascular Care Navigation  [phone]

## 2020-11-13 ENCOUNTER — Telehealth: Payer: Self-pay | Admitting: Licensed Clinical Social Worker

## 2020-11-13 NOTE — Telephone Encounter (Signed)
CSW has not received call back or text back regarding pt f/u w/ [facility].  CSW attempted to call and reach pt today, call went straight to voicemail that is full at this time. CSW remains available for additional support if pt should return this writers calls/texts. Will reattempt tomorrow.   Octavio Graves, MSW, LCSW [facility] Health Heart/Vascular Care Navigation  [phone]

## 2020-11-14 ENCOUNTER — Telehealth: Payer: Self-pay | Admitting: Licensed Clinical Social Worker

## 2020-11-14 NOTE — Telephone Encounter (Signed)
Additional attempts made through text and phone call to reach pt and connect her with [facility] who continues to try and reach pt. No answer when called and text messages have also been unanswered. Care Navigation team remains available to assist pt as needed.   Octavio Graves, MSW, LCSW [facility] Health Heart/Vascular Care Navigation  [phone]

## 2020-11-15 ENCOUNTER — Telehealth: Payer: Self-pay | Admitting: Licensed Clinical Social Worker
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
## 2020-11-16 ENCOUNTER — Telehealth: Payer: Self-pay | Admitting: Licensed Clinical Social Worker

## 2020-11-16 NOTE — Telephone Encounter (Signed)
LCSW received a message from [phone] again. Pt did not acknowledge that I had requested she reach out to [facility] again stating she was using a friends phone and asking if she could get a new phone.  Let pt know unfortunately the clinic only provides one phone per pt as needed. She does have a working phone at the extended stay motel along with this phone she contacted me from. I again requested she reach out to [facility] who is still trying to reach her and provided her the number. I will mail a reminder for her C[facility]W appointment closer to that date in February. She is enrolled in transportation services and can still request their assistance to get to [facility] and other [place] appointment/pharmacies. Pt has been provided CAFA twice along with instructions on how to complete that application. She had documentation also that indicated she has coverage under [facility], Account [id number], Subscriber/Member ID [id number]). Unclear if pt ever reached out to [facility] as requested by this writer to clarify benefits. If for some reason this coverage is not valid we have discussed how to apply for Medicaid and pt is aware of how to get to DSS and how to apply.   Care Navigation team remains available for pt as needed moving forward.   Octavio Graves, MSW, LCSW [facility] Health Heart/Vascular Care Navigation  [phone]

## 2020-11-20 ENCOUNTER — Other Ambulatory Visit: Payer: Self-pay | Admitting: Family Medicine

## 2020-11-20 DIAGNOSIS — M25551 Pain in right hip: Secondary | ICD-10-CM

## 2020-11-20 NOTE — Telephone Encounter (Signed)
Requested medication (s) are due for refill today: yes  Requested medication (s) are on the active medication list: yes  Last refill:  05/08/20  Future visit scheduled: yes  Notes to clinic:  med not delegated to NT to RF   Requested Prescriptions  Pending Prescriptions Disp Refills   methocarbamol (ROBAXIN) 500 MG tablet [Pharmacy Med Name: METHOCARBAMOL 500 MG TABS 500 Tablet] 60 tablet 1    Sig: Take 1 tablet (500 mg total) by mouth every 8 (eight) hours as needed for muscle spasms.      Not Delegated - Analgesics:  Muscle Relaxants Failed - 11/20/2020  9:27 AM      Failed - This refill cannot be delegated      Passed - Valid encounter within last 6 months    Recent Outpatient Visits           5 months ago Type 2 diabetes mellitus with other circulatory complication, without long-term current use of insulin [facility])   [facility] [place], Cornelius Moras, RPH-CPP   6 months ago Encounter for medication review and counseling   [facility] [place], Jeannett Senior L, RPH-CPP   6 months ago Type 2 diabetes mellitus with other circulatory complication, without long-term current use of insulin (HCC)   [place] [facility] [place], [place], MD   9 months ago Type 2 diabetes mellitus with other circulatory complication, without long-term current use of insulin (HCC)   [place] [place], Odette Horns, MD   1 year ago Type 2 diabetes mellitus with other circulatory complication, without long-term current use of insulin (HCC)   [place] [facility] Hoy Register, MD       Future Appointments             In 2 weeks Hoy Register, MD [facility]   In 2 months Kwasny Ishikawa, MD [facility] [place], [facility]

## 2020-11-21 ENCOUNTER — Telehealth: Payer: Self-pay | Admitting: Licensed Clinical Social Worker

## 2020-11-21 MED FILL — ROSUVASTATIN CALCIUM 40 MG: 40 | 30 days supply | Qty: 30 | Fill #0

## 2020-11-21 MED FILL — CLOPIDOGREL 75 MG TABLET: 75 | 30 days supply | Qty: 30 | Fill #0

## 2020-11-21 MED FILL — LISINOPRIL 20 MG TABLET: 20 | 30 days supply | Qty: 30 | Fill #0

## 2020-11-21 MED FILL — NITROGLYCERIN 0.4 MG TAB SL: 0.4 | 25 days supply | Qty: 25 | Fill #0

## 2020-11-21 MED FILL — CARVEDILOL 25 MG TABLET: 25 | 30 days supply | Qty: 60 | Fill #0

## 2020-11-21 NOTE — Telephone Encounter (Signed)
Pt called back and shared that the pharmacy was unable to locate current coverage under [facility]. Pt has the number for [facility] and I encouraged her to call them tonight to assess if she has coverage or not. Pt shares she will. She also understands that [employer] will assist her with a ride tomorrow to [employer]. Pt will call this writer back after she has contacted [employer].   Octavio Graves, MSW, LCSW [facility] Heart/Vascular Care Navigation  [phone]

## 2020-11-21 NOTE — Telephone Encounter (Signed)
Pt contacted CSW via text from [phone], she states that she is on her way to [facility] to pick up medications and is asking for CSW to cover the costs. I explained that again pt bill indicated that she has coverage under [facility], Account [id number], Subscriber/Member ID [id number]). If the pharmacy has any challenges with pt coverage then pt can have the pharmacy call this Clinical research associate.   Pt has arranged a ride to [facility] to get her medications this afternoon. She also states that she has found the phone and if we can reactivate it. I shared that I would have to pass pt request on to my team lead. I also requested that she give me a call to discuss ongoing needs.  I was able to confirm that she is able to able to take [employer] to her appointment tomorrow at [facility] and arranged pick up at 10:15 for her 11 am appointment.   Pt called this Clinical research associate and was at the [facility] pharmacy. They do not accept [employer] coverage and are working on getting her medications sent over to [facility] at this time.   Octavio Graves, MSW, LCSW [facility] Heart/Vascular Care Navigation  [phone]

## 2020-11-22 ENCOUNTER — Telehealth: Payer: Self-pay | Admitting: Licensed Clinical Social Worker

## 2020-11-22 NOTE — Telephone Encounter (Signed)
Pt contacted this Clinical research associate via text. She shared that she was able to get to her appointment with the [facility] today. She continues to have difficulty getting her medications through the pharmacy w/ her [employer] coverage. She states she called multiple times last night and this morning but "no answer." CSW was able to get member benefits sent to office fax machine at [phone]. CSW then called customer service at [phone]. Three way called in pt during this call  Was able to verify pt coverage, pt currently w/ past due balance, but in grace period through March 31st 2022. Pt aware and will pay past due bill. I was able to get Rx [facility], PCN [id number], [employer].   Per pharmacy team at outpatient pharmacy they are still having challenges w/ confirming coverage. Discussed case with team lead Annice Pih, we will be able to assist pt with medication cost at this time.   Octavio Graves, MSW, LCSW [facility] Heart/Vascular Care Navigation  [phone]

## 2020-12-06 ENCOUNTER — Ambulatory Visit: Payer: Self-pay | Attending: Family Medicine | Admitting: Family Medicine

## 2020-12-06 ENCOUNTER — Other Ambulatory Visit: Payer: Self-pay

## 2020-12-06 ENCOUNTER — Telehealth: Payer: Self-pay | Admitting: Licensed Clinical Social Worker

## 2020-12-06 ENCOUNTER — Other Ambulatory Visit: Payer: Self-pay | Admitting: Family Medicine

## 2020-12-06 ENCOUNTER — Encounter: Payer: Self-pay | Admitting: Family Medicine

## 2020-12-06 VITALS — BP 165/81 | HR 60 | Ht 65.0 in | Wt 208.0 lb

## 2020-12-06 DIAGNOSIS — E1159 Type 2 diabetes mellitus with other circulatory complications: Secondary | ICD-10-CM

## 2020-12-06 DIAGNOSIS — Z9114 Patient's other noncompliance with medication regimen: Secondary | ICD-10-CM

## 2020-12-06 DIAGNOSIS — I152 Hypertension secondary to endocrine disorders: Secondary | ICD-10-CM

## 2020-12-06 DIAGNOSIS — M19042 Primary osteoarthritis, left hand: Secondary | ICD-10-CM

## 2020-12-06 DIAGNOSIS — M25551 Pain in right hip: Secondary | ICD-10-CM

## 2020-12-06 DIAGNOSIS — M25552 Pain in left hip: Secondary | ICD-10-CM

## 2020-12-06 DIAGNOSIS — M19041 Primary osteoarthritis, right hand: Secondary | ICD-10-CM

## 2020-12-06 DIAGNOSIS — I214 Non-ST elevation (NSTEMI) myocardial infarction: Secondary | ICD-10-CM

## 2020-12-06 LAB — GLUCOSE, POCT (MANUAL RESULT ENTRY)
POC Glucose: 391 mg/dl — AB (ref 70–99)
POC Glucose: 363 mg/dl — AB (ref 70–99)

## 2020-12-06 LAB — POCT GLYCOSYLATED HEMOGLOBIN (HGB A1C): HbA1c, POC (controlled diabetic range): 11.1 % — AB (ref 0.0–7.0)

## 2020-12-06 MED ORDER — GLIPIZIDE 10 MG PO TABS: 10.0000 mg | ORAL_TABLET | Freq: Two times a day (BID) | ORAL | 6 refills | Status: DC

## 2020-12-06 MED ORDER — DICLOFENAC SODIUM 1 % EX GEL: 4.0000 g | Freq: Four times a day (QID) | CUTANEOUS | 3 refills | Status: DC

## 2020-12-06 MED ORDER — FREESTYLE SYSTEM KIT: 1.0000 | PACK | Freq: Every day | 11 refills | Status: DC

## 2020-12-06 MED ORDER — INSULIN ASPART 100 UNIT/ML ~~LOC~~ SOLN
8.0000 [IU] | Freq: Once | SUBCUTANEOUS | Status: AC
  Administered 2020-12-06: 8 [IU] via SUBCUTANEOUS

## 2020-12-06 MED ORDER — METHOCARBAMOL 500 MG PO TABS: 500.0000 mg | ORAL_TABLET | Freq: Three times a day (TID) | ORAL | 1 refills | Status: DC | PRN

## 2020-12-06 MED ORDER — TRULICITY 0.75 MG/0.5ML ~~LOC~~ SOAJ: 0.7500 mg | SUBCUTANEOUS | 6 refills | Status: DC

## 2020-12-06 MED ORDER — TRULICITY 0.75 MG/0.5ML ~~LOC~~ SOAJ: 0.7500 mg | SUBCUTANEOUS | 3 refills | Status: DC

## 2020-12-06 MED ORDER — LISINOPRIL-HYDROCHLOROTHIAZIDE 20-25 MG PO TABS: 1.0000 | ORAL_TABLET | Freq: Every day | ORAL | 1 refills | Status: DC

## 2020-12-06 MED FILL — FREESTYLE LITE TEST STRIP: 50 days supply | Qty: 100 | Fill #0

## 2020-12-06 MED FILL — glipiZIDE 10 MG TABS: 10 | 90 days supply | Qty: 180 | Fill #0

## 2020-12-06 MED FILL — FREESTYLE LANCETS: 50 days supply | Qty: 100 | Fill #0

## 2020-12-06 MED FILL — DICLOFENAC SODIUM 1 % GEL: 1 | 7 days supply | Qty: 100 | Fill #0

## 2020-12-06 MED FILL — LISINOPRIL-HCTZ 20-25 MG TA: 20-25 | 90 days supply | Qty: 90 | Fill #0

## 2020-12-06 MED FILL — FREESTYLE LITE METER: W/DEVICE | 1 days supply | Qty: 1 | Fill #0

## 2020-12-06 MED FILL — METHOCARBAMOL 500 MG TABS: 500 | 20 days supply | Qty: 60 | Fill #0
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
## 2020-12-06 NOTE — Progress Notes (Signed)
States that her entire body is in pain.

## 2020-12-06 NOTE — Telephone Encounter (Signed)
Pt texted this Clinical research associate stating she was not working anymore. Called pt to clarify, reached her at [phone]. Pt shares that she was let go because her employer told her "she had too many doctors appointments." Shared with pt that I was sorry to hear that. Let her know she should reach out to [employer] and let them know that as well since they are working on [place] and Disability at this time. Pt has my number for any additional questions/concerns.   Octavio Graves, MSW, LCSW [facility] Heart/Vascular Care Navigation  [phone]

## 2020-12-06 NOTE — Patient Instructions (Signed)
Diabetes Mellitus and Nutrition, Adult When you have diabetes, or diabetes mellitus, it is very important to have healthy eating habits because your blood sugar (glucose) levels are greatly affected by what you eat and drink. Eating healthy foods in the right amounts, at about the same times every day, can help you:  Control your blood glucose.  Lower your risk of heart disease.  Improve your blood pressure.  Reach or maintain a healthy weight. What can affect my meal plan? Every person with diabetes is different, and each person has different needs for a meal plan. Your health care provider may recommend that you work with a dietitian to make a meal plan that is best for you. Your meal plan may vary depending on factors such as:  The calories you need.  The medicines you take.  Your weight.  Your blood glucose, blood pressure, and cholesterol levels.  Your activity level.  Other health conditions you have, such as heart or kidney disease. How do carbohydrates affect me? Carbohydrates, also called carbs, affect your blood glucose level more than any other type of food. Eating carbs naturally raises the amount of glucose in your blood. Carb counting is a method for keeping track of how many carbs you eat. Counting carbs is important to keep your blood glucose at a healthy level, especially if you use insulin or take certain oral diabetes medicines. It is important to know how many carbs you can safely have in each meal. This is different for every person. Your dietitian can help you calculate how many carbs you should have at each meal and for each snack. How does alcohol affect me? Alcohol can cause a sudden decrease in blood glucose (hypoglycemia), especially if you use insulin or take certain oral diabetes medicines. Hypoglycemia can be a life-threatening condition. Symptoms of hypoglycemia, such as sleepiness, dizziness, and confusion, are similar to symptoms of having too much  alcohol.  Do not drink alcohol if: ? Your health care provider tells you not to drink. ? You are pregnant, may be pregnant, or are planning to become pregnant.  If you drink alcohol: ? Do not drink on an empty stomach. ? Limit how much you use to:  0-1 drink a day for women.  0-2 drinks a day for men. ? Be aware of how much alcohol is in your drink. In the [place] one drink equals one 12 oz bottle of beer (355 mL), one 5 oz glass of wine (148 mL), or one 1 oz glass of hard liquor (44 mL). ? Keep yourself hydrated with water, diet soda, or unsweetened iced tea.  Keep in mind that regular soda, juice, and other mixers may contain a lot of sugar and must be counted as carbs. What are tips for following this plan? Reading food labels  Start by checking the serving size on the "Nutrition Facts" label of packaged foods and drinks. The amount of calories, carbs, fats, and other nutrients listed on the label is based on one serving of the item. Many items contain more than one serving per package.  Check the total grams (g) of carbs in one serving. You can calculate the number of servings of carbs in one serving by dividing the total carbs by 15. For example, if a food has 30 g of total carbs per serving, it would be equal to 2 servings of carbs.  Check the number of grams (g) of saturated fats and trans fats in one serving. Choose foods that have   a low amount or none of these fats.  Check the number of milligrams (mg) of salt (sodium) in one serving. Most people should limit total sodium intake to less than 2,300 mg per day.  Always check the nutrition information of foods labeled as "low-fat" or "nonfat." These foods may be higher in added sugar or refined carbs and should be avoided.  Talk to your dietitian to identify your daily goals for nutrients listed on the label. Shopping  Avoid buying canned, pre-made, or processed foods. These foods tend to be high in fat, sodium, and added  sugar.  Shop around the outside edge of the grocery store. This is where you will most often find fresh fruits and vegetables, bulk grains, fresh meats, and fresh dairy. Cooking  Use low-heat cooking methods, such as baking, instead of high-heat cooking methods like deep frying.  Cook using healthy oils, such as olive, canola, or sunflower oil.  Avoid cooking with butter, cream, or high-fat meats. Meal planning  Eat meals and snacks regularly, preferably at the same times every day. Avoid going long periods of time without eating.  Eat foods that are high in fiber, such as fresh fruits, vegetables, beans, and whole grains. Talk with your dietitian about how many servings of carbs you can eat at each meal.  Eat 4-6 oz (112-168 g) of lean protein each day, such as lean meat, chicken, fish, eggs, or tofu. One ounce (oz) of lean protein is equal to: ? 1 oz (28 g) of meat, chicken, or fish. ? 1 egg. ?  cup (62 g) of tofu.  Eat some foods each day that contain healthy fats, such as avocado, nuts, seeds, and fish.   What foods should I eat? Fruits Berries. Apples. Oranges. Peaches. Apricots. Plums. Grapes. Mango. Papaya. Pomegranate. Kiwi. Cherries. Vegetables Lettuce. Spinach. Leafy greens, including kale, chard, collard greens, and mustard greens. Beets. Cauliflower. Cabbage. Broccoli. Carrots. Green beans. Tomatoes. Peppers. Onions. Cucumbers. Brussels sprouts. Grains Whole grains, such as whole-wheat or whole-grain bread, crackers, tortillas, cereal, and pasta. Unsweetened oatmeal. Quinoa. Brown or wild rice. Meats and other proteins Seafood. Poultry without skin. Lean cuts of poultry and beef. Tofu. Nuts. Seeds. Dairy Low-fat or fat-free dairy products such as milk, yogurt, and cheese. The items listed above may not be a complete list of foods and beverages you can eat. Contact a dietitian for more information. What foods should I avoid? Fruits Fruits canned with  syrup. Vegetables Canned vegetables. Frozen vegetables with butter or cream sauce. Grains Refined white flour and flour products such as bread, pasta, snack foods, and cereals. Avoid all processed foods. Meats and other proteins Fatty cuts of meat. Poultry with skin. Breaded or fried meats. Processed meat. Avoid saturated fats. Dairy Full-fat yogurt, cheese, or milk. Beverages Sweetened drinks, such as soda or iced tea. The items listed above may not be a complete list of foods and beverages you should avoid. Contact a dietitian for more information. Questions to ask a health care provider  Do I need to meet with a diabetes educator?  Do I need to meet with a dietitian?  What number can I call if I have questions?  When are the best times to check my blood glucose? Where to find more information:  American Diabetes Association: diabetes.org  Academy of Nutrition and Dietetics: www.eatright.org  National Institute of Diabetes and Digestive and Kidney Diseases: www.niddk.nih.gov  Association of Diabetes Care and Education Specialists: www.diabeteseducator.org Summary  It is important to have healthy eating   habits because your blood sugar (glucose) levels are greatly affected by what you eat and drink.  A healthy meal plan will help you control your blood glucose and maintain a healthy lifestyle.  Your health care provider may recommend that you work with a dietitian to make a meal plan that is best for you.  Keep in mind that carbohydrates (carbs) and alcohol have immediate effects on your blood glucose levels. It is important to count carbs and to use alcohol carefully. This information is not intended to replace advice given to you by your health care provider. Make sure you discuss any questions you have with your health care provider. Document Revised: 09/27/2019 Document Reviewed: 09/27/2019 Elsevier Patient Education  2021 Elsevier Inc.  

## 2020-12-07 ENCOUNTER — Telehealth: Payer: Self-pay | Admitting: Licensed Clinical Social Worker

## 2020-12-07 NOTE — Telephone Encounter (Signed)
Pt contacted this Clinical research associate stating she is having issues again with her insurance and getting pharmacy to run her benefits. I shared with pt that she needs to call patient help line at [phone]. She had previously spoken with insurance at that time with this Clinical research associate and requested a new card on 11/22/20. I shared that she may need to hold for a representative, but that in order to try and figure out what is going on that she needs to attempt to try to contact [employer] first.   I will also send pt information to patient pharmacy advocate to review benefits.  Octavio Graves, MSW, LCSW [facility] Heart/Vascular Care Navigation  [phone]

## 2020-12-10 ENCOUNTER — Telehealth: Payer: Self-pay | Admitting: Licensed Clinical Social Worker

## 2020-12-10 ENCOUNTER — Other Ambulatory Visit: Payer: Self-pay | Admitting: Pharmacist

## 2020-12-10 DIAGNOSIS — E1159 Type 2 diabetes mellitus with other circulatory complications: Secondary | ICD-10-CM

## 2020-12-10 MED ORDER — TRUE METRIX METER W/DEVICE KIT: PACK | 0 refills | Status: DC

## 2020-12-10 MED ORDER — TRUE METRIX BLOOD GLUCOSE TEST VI STRP: ORAL_STRIP | 2 refills | Status: DC

## 2020-12-10 MED ORDER — TRUEPLUS LANCETS 28G MISC: 2 refills | Status: DC

## 2020-12-10 MED FILL — TRUEplus LANCETS 28G MISC: 50 days supply | Qty: 100 | Fill #0

## 2020-12-10 MED FILL — METHOCARBAMOL 500 MG TABS: 500 | 20 days supply | Qty: 60 | Fill #0

## 2020-12-10 MED FILL — TRUE METRIX GLUCOSE TEST ST: 50 days supply | Qty: 100 | Fill #0

## 2020-12-10 MED FILL — ?TRULICITY 0.75 MG/0.5ML PE: 0.75 | 28 days supply | Qty: 2 | Fill #0

## 2020-12-10 MED FILL — LISINOPRIL-HYDROCHLOROTHIAZ: 20-25 | 30 days supply | Qty: 30 | Fill #0

## 2020-12-10 MED FILL — ?glipiZIDE 10MG TABLETS: 10 | 30 days supply | Qty: 60 | Fill #0

## 2020-12-10 MED FILL — DICLOFENAC SODIUM 1% GEL: 1 | 7 days supply | Qty: 100 | Fill #0

## 2020-12-10 MED FILL — !TRUE METRIX BLOOD GLUCOSE: 1 days supply | Qty: 1 | Fill #0

## 2020-12-10 NOTE — Telephone Encounter (Addendum)
Pt responded back to this writer via text, continues to not pick up phone when called/voicemail full. Pt stating she is poor and needs her medications, does not indicate whether or not she will call [employer]. LCSW called and shared challenges with RNCM Erskine Squibb at [facility] clinic, since pt is active w/ Dr. Alvis Lemmings. Erskine Squibb able to confirm w/ their pharmacy that her coverage is currently inactive therefore she is once again eligible to use their pharmacy. Pt needs to call pharmacy and let them know what she needs refills for and they may be able to assist with one time free fills. Pt also will need to work on assistance paperwork [facility] Card) again, this has previously been provided for pt.   LCSW called pt again to inform her that she is once again able to fill her medications at the pharmacy at [facility]. Pt did not pick up again. LCSW sent message with their number and that she may be able to fill for one time free fills/complete pt assistance with them. Pt returned message with text "don't have money." LCSW again explained that pt needs to call and speak with pharmacy regarding needed medications potentially for one time free fills/to complete assistance applications.   This Clinical research associate also called Annice Pih, LCSW team lead, regarding pt needs above and the multiple ways pt has been assisted by this Clinical research associate through this point. Annice Pih is in agreement that pt needs to work on [employer] w/ [employer] for more long term financial assistance w/ medications given that she sees Dr. Alvis Lemmings for primary care.   Medicaid/Disability was completed w/ [facility], I have reached out to them to see if any specific date regarding when those two applications were completed.   Octavio Graves, MSW, LCSW [facility] Heart/Vascular Care Navigation  [phone]

## 2020-12-10 NOTE — Telephone Encounter (Signed)
Pharmacy advocate unable to run benefits. LCSW called and spoke with representative at [facility]. Per representative pt is past due x2 months therefore there is likely a hold for her benefits. I attempted to call pt and relay this to her. No answer, voicemail full. Pt must call and discuss this hold with [employer] [facility] and either pay the balance or speak with them regarding how to continue/cancel coverage if she is not going to be able to continue to afford the plan. LCSW texted pt the above information and assistance number provided [phone] and let pt know it is now up to pt to call. I remain available for any additional questions, unfortunately we are unable to pay insurance coverage related costs at this time.   Octavio Graves, MSW, LCSW [facility] Health Heart/Vascular Care Navigation  [phone]

## 2020-12-17 ENCOUNTER — Telehealth: Payer: Self-pay | Admitting: Licensed Clinical Social Worker

## 2020-12-17 NOTE — Telephone Encounter (Signed)
Pt sent this writer a text asking when her appointments were.  LCSW provided her with [facility] phone number again and reminder about appointments on: 3/3 at 9:30am at [facility] 3/28 at 9:20am at [facility] 5/3 at 11:10am at [facility]  Pt has my number for any additional concerns/questions.   Octavio Graves, MSW, LCSW [facility] Health Heart/Vascular Care Navigation  [phone]

## 2020-12-17 NOTE — Telephone Encounter (Signed)
Pt sent additional text message to this writer asking again when her [facility] appointment was stating she had already been to [facility] appointment.  Again provided all three upcoming appointments and with which providers they are with, these are in addition to the previous appointments pt has been to.   Octavio Graves, MSW, LCSW [facility] Health Heart/Vascular Care Navigation  [phone]

## 2020-12-20 ENCOUNTER — Telehealth: Payer: Self-pay | Admitting: Licensed Clinical Social Worker

## 2020-12-20 MED FILL — !TRUE METRIX BLOOD GLUCOSE: 1 days supply | Qty: 1 | Fill #0

## 2020-12-20 MED FILL — LISINOPRIL-HYDROCHLOROTHIAZ: 20-25 | 30 days supply | Qty: 30 | Fill #0

## 2020-12-20 MED FILL — ?ROSUVASTATIN CALCIUM 40M T: 40 | 30 days supply | Qty: 30 | Fill #0

## 2020-12-20 MED FILL — TRUEplus LANCETS 28G MISC: 50 days supply | Qty: 100 | Fill #0

## 2020-12-20 MED FILL — ?glipiZIDE 10MG TABLETS: 10 | 30 days supply | Qty: 60 | Fill #0

## 2020-12-20 MED FILL — ?TRULICITY 0.75 MG/0.5ML PE: 0.75 | 28 days supply | Qty: 2 | Fill #0

## 2020-12-20 MED FILL — ?CLOPIDOGREL 75 MG TABL: 75 | 30 days supply | Qty: 30 | Fill #0

## 2020-12-20 MED FILL — DICLOFENAC SODIUM 1% GEL: 1 | 7 days supply | Qty: 100 | Fill #0

## 2020-12-20 MED FILL — METHOCARBAMOL 500 MG TABS: 500 | 20 days supply | Qty: 60 | Fill #0

## 2020-12-20 MED FILL — TRUE METRIX GLUCOSE TEST ST: 50 days supply | Qty: 100 | Fill #0

## 2020-12-20 NOTE — Telephone Encounter (Signed)
Pt texted this Clinical research associate stating she needs medications refilled, I shared that pt needed to directly contact her pharmacy and discuss what medications she needs refilled. I also let her know that she needs to request a [employer] application. She is aware that in order to continue to receive assistance covering these medications that she needs to f/u with that medications. Our care navigation team has already financially covered medications refills multiple times and pt will utilize her free refill to get these medications filled. Pt states understanding, I also reached out to [place], [facility] at [facility] and she will leave [facility] Card application for pt when she picks up her medications.   Octavio Graves, MSW, LCSW [facility] Health Heart/Vascular Care Navigation  [phone]

## 2020-12-31 ENCOUNTER — Telehealth: Payer: Self-pay | Admitting: Licensed Clinical Social Worker

## 2020-12-31 NOTE — Progress Notes (Signed)
Heart and Vascular Care Navigation  12/31/2020  [name] [birth year] [id number]  Reason for Referral:  Pt came to clinic on wrong day, medication assistance                                                                                                    Assessment:                                     Pt texted this writer and let me know she "was a month early for my appointment."  LCSW was able to touch base with pt prior to her calling Transportation Services for a pick up ride. Pt misread the appointment dates and scheduled a ride for today instead of 3/28. We reviewed again that pt has an appointment on March 3rd w/ [facility]. She also has an appointment on March 28th with [facility] office. Pt aware and feels comfortable w/ scheduling her own rides. She does inquire about moving the [facility] appt this Thursday to a later time that day- she is aware she needs to call [facility] and reschedule the time if able to.   Pt Medicaid and Disability applications currently pending, her [facility] coverage has lapsed. Due to pending Medicaid application she is unable to apply for [facility]. See below for f/u plans.   HRT/VAS Care Coordination    Patients Home Cardiology Office [facility]   Outpatient Care Team Social Worker   Social Worker Name: [name] [place], [phone]   Living arrangements for the past 2 months Hotel/Motel   Lives with: Self   Patient Current Insurance Coverage Medicaid Pending   Patient Has Concern With Paying Medical Bills Yes   Patient Concerns With Medical Bills prior hospitalizations, ongoing medical bills   Medical Bill Referrals: Medicaid app pending w/ [facility];  CAFA app provided   Does Patient Have Prescription Coverage? No   Patient Prescription Assistance Programs [place] Card; [place] [employer]  pt provided w/ applications   Home Assistive Devices/Equipment None      Social History:                                                                              SDOH Screenings   Alcohol Screen: Not on file  Depression (PHQ2-9): Low Risk   . PHQ-2 Score: 0  Financial Resource Strain: High Risk  . Difficulty of Paying Living Expenses: Very hard  Food Insecurity: Food Insecurity Present  . Worried About Programme researcher, broadcasting/film/video in the Last Year: Sometimes true  . Ran Out of Food in the Last Year: Sometimes true  Housing: Medium Risk  . Last Housing Risk Score: 1  Physical Activity: Not on file  Social Connections: Not on file  Stress: Not on file  Tobacco Use: Low Risk   . Smoking Tobacco Use: Never Smoker  . Smokeless Tobacco Use: Never Used  Transportation Needs: Unmet Transportation Needs  . Lack of Transportation (Medical): Yes  . Lack of Transportation (Non-Medical): Yes    SDOH Interventions: Financial Resources:  Financial Strain Interventions: Other (Comment) ([place] [employer]; Blue Card; [employer])  Transportation:    Pt utilizes [employer]      Other Care Navigation Interventions:     Provided Pharmacy assistance resources [employer] (pt provided w/ applications)   Follow-up plan:   Pt has received the [employer] application and I encouraged her to complete that and bring it to her appt at [facility] this week. I also have provided her with [place] [employer] w/ instructions to gather needed documents and bring the application back to her appointment on 3/28. Pt has my number for any additional questions/concerns.

## 2021-01-02 ENCOUNTER — Telehealth: Payer: Self-pay | Admitting: Family Medicine

## 2021-01-02 ENCOUNTER — Other Ambulatory Visit: Payer: Self-pay | Admitting: Family Medicine

## 2021-01-02 DIAGNOSIS — I10 Essential (primary) hypertension: Secondary | ICD-10-CM

## 2021-01-02 MED ORDER — CARVEDILOL 25 MG PO TABS: 25.0000 mg | ORAL_TABLET | Freq: Two times a day (BID) | ORAL | 0 refills | Status: DC

## 2021-01-02 NOTE — Telephone Encounter (Signed)
Rx sent 

## 2021-01-02 NOTE — Telephone Encounter (Signed)
Patient called in and needed to reschedule her appointment for tomorrow because she could not make it that early. Patient opted to reschedule for 3/22/22but stated she may run out of carvedilol by then. Can patient have refills sent to cover her until appt with you? Please advise.

## 2021-01-03 ENCOUNTER — Ambulatory Visit: Payer: Self-pay | Admitting: Pharmacist

## 2021-01-03 MED FILL — ?CARVEDILOL 25 MG TABLET: 25 | 30 days supply | Qty: 60 | Fill #0

## 2021-01-10 ENCOUNTER — Telehealth: Payer: Self-pay | Admitting: Licensed Clinical Social Worker

## 2021-01-10 NOTE — Progress Notes (Signed)
Heart and Vascular Care Navigation  01/10/2021  [name] 21-May-1966 [id number]  Reason for Referral:  F/u for disability application                                                                                                  Assessment:                       LCSW received voicemail from pt requesting CSW assistance w/ getting bills for disability application that she is currently working w/ [employer]. LCSW called [facility] and left voicemail requesting call back w/ specifics. I also called pt and let her know I received her message and would try and assist pt either with providing the right contact number or assisting the pt w/ calling and making that request once it is clarified what pt needs to submit.                 HRT/VAS Care Coordination    Patients Home Cardiology Office [facility]   Outpatient Care Team Social Worker   Social Worker Name: [name] [place], [phone]   Living arrangements for the past 2 months Hotel/Motel   Lives with: Self   Patient Current Insurance Coverage Medicaid Pending   Patient Has Concern With Paying Medical Bills Yes   Patient Concerns With Medical Bills prior hospitalizations, ongoing medical bills   Medical Bill Referrals: Medicaid app pending w/ [facility];  CAFA app provided   Does Patient Have Prescription Coverage? No   Patient Prescription Assistance Programs [place] Card; [place] [employer]ist  pt provided w/ applications   Home Assistive Devices/Equipment None      Social History:                                                                             SDOH Screenings   Alcohol Screen: Not on file  Depression (PHQ2-9): Low Risk   . PHQ-2 Score: 0  Financial Resource Strain: High Risk  . Difficulty of Paying Living Expenses: Very hard  Food Insecurity: Food Insecurity Present  . Worried About Programme researcher, broadcasting/film/video in the Last Year: Sometimes true  . Ran Out of  Food in the Last Year: Sometimes true  Housing: Medium Risk  . Last Housing Risk Score: 1  Physical Activity: Not on file  Social Connections: Not on file  Stress: Not on file  Tobacco Use: Low Risk   . Smoking Tobacco Use: Never Smoker  . Smokeless Tobacco Use: Never Used  Transportation Needs: Unmet Transportation Needs  . Lack of Transportation (Medical): Yes  . Lack of Transportation (Non-Medical): Yes    SDOH Interventions: Financial Resources:  Financial Strain Interventions: Other (Comment) (f/u w/ [facility] regarding disability)  Follow-up plan:   I will follow and assist pt with obtaining needed documentation for disability application.

## 2021-01-11 ENCOUNTER — Telehealth: Payer: Self-pay | Admitting: Licensed Clinical Social Worker

## 2021-01-11 NOTE — Telephone Encounter (Addendum)
LCSW received call back from pt, she is aware that bills should have been sent to her email and requested she check her email when able. Shared that it may have gone to her spam folder as well. I also shared that pt has several visits from [facility] that I will not be able to get those bills on her behalf as I am not affiliated w/ that system and she will need to call them. Pt states she doesn't get bills from [facility] and so she doesn't think she can get those.   I have sent an email to [facility] informing them of this conversation. I will f/u if I haven't heard back from pt by next week.   Octavio Graves, MSW, LCSW [facility] Health Heart/Vascular Care Navigation  [phone]

## 2021-01-11 NOTE — Progress Notes (Signed)
Heart and Vascular Care Navigation  01/11/2021  [name] September 08, 1966 [id number]  Reason for Referral:  F/u on Medicaid application and assistance                                                                                                   Assessment:                                     LCSW spoke with [name], disability specialist at [facility] who is assisting pt w/ Medicaid and Disability applications. Medicaid requesting all unpaid medical bills from February 2020- present. LCSW able to assist w/ [place] bills but I cannot assist w/ obtaining bills from [facility] or any other health system. LCSW called and spoke with billing department at [phone]. They have had several returned pieces of mail from [facility]. Pt w/ email on file which she has shared with this writer that she utilizes and they will send it through email to her. LCSW called pt and left HIPAA compliant message at [phone] to let pt know of what has been completed and requesting a return call for what pt needs to assist with at this time.   HRT/VAS Care Coordination    Patients Home Cardiology Office [facility]   Outpatient Care Team Social Worker   Social Worker Name: [name] [place], [phone]   Living arrangements for the past 2 months Hotel/Motel   Lives with: Self   Patient Current Insurance Coverage Medicaid Pending   Patient Has Concern With Paying Medical Bills Yes   Patient Concerns With Medical Bills prior hospitalizations, ongoing medical bills   Medical Bill Referrals: Medicaid app pending w/ [facility];  CAFA app provided   Does Patient Have Prescription Coverage? No   Patient Prescription Assistance Programs [place] Card; [place] [employer]  pt provided w/ applications   Home Assistive Devices/Equipment None      Social History:                                                                             SDOH Screenings   Alcohol  Screen: Not on file  Depression (PHQ2-9): Low Risk   . PHQ-2 Score: 0  Financial Resource Strain: High Risk  . Difficulty of Paying Living Expenses: Very hard  Food Insecurity: Food Insecurity Present  . Worried About Programme researcher, broadcasting/film/video in the Last Year: Sometimes true  . Ran Out of Food in the Last Year: Sometimes true  Housing: Medium Risk  . Last Housing Risk Score: 1  Physical Activity: Not on file  Social Connections: Not on file  Stress: Not on file  Tobacco Use: Low Risk   . Smoking Tobacco  Use: Never Smoker  . Smokeless Tobacco Use: Never Used  Transportation Needs: Unmet Transportation Needs  . Lack of Transportation (Medical): Yes  . Lack of Transportation (Non-Medical): Yes    SDOH Interventions: Financial Resources:  Financial Strain Interventions: Other (Comment) (assistance w/ Medicaid application)   Follow-up plan:   LCSW will f/u again w/ pt regarding bills and how to obtain those. Once received pt will need to send them to [facility] for them to be submitted on her behalf.

## 2021-01-14 ENCOUNTER — Telehealth: Payer: Self-pay | Admitting: Licensed Clinical Social Worker

## 2021-01-14 NOTE — Telephone Encounter (Signed)
LCSW spoke with pt via telephone. She has not yet checked her email for the bills that we had spoken about Friday. Pt states she is also out of medications. I inquired if she has called the pharmacy yet- she has not and will do that this afternoon.   I continue to follow to assist pt as able.   Octavio Graves, MSW, LCSW [facility] Health Heart/Vascular Care Navigation  [phone]

## 2021-01-15 ENCOUNTER — Telehealth: Payer: Self-pay | Admitting: Licensed Clinical Social Worker

## 2021-01-15 NOTE — Progress Notes (Signed)
Heart and Vascular Care Navigation  01/15/2021  Adaria Hole Olesky January 26, 1966 [id number]  Reason for Referral:   f/u on bills for Medicaid                                                                                                 Assessment:   LCSW called pt this morning. She shares that she still has not checked to see if the bills have been sent to her- her daughter was using her computer and so she would need to log back into her e-mail and hasn't done that yet. Pt shares she is "going through something" and when asked she further shares her son has been coming around the hotel and hotel management has threatened to put her out if he continues to come. Pt CARES assistance ran out last week and she has paid further but is worried between the issues her son has created and finances that she wont have her place. She shares this is all very overwhelming for her and is tearful during our conversation. Verbal encouragement given for pt perseverance. I inquired if she could reach out to any of her other children (I believe she has 7 children in total) to see if they are able to provide financial, housing, or just emotional support for pt. Pt shares her "baby girl" would be who she would call but worries about bothering her since she has her own children. I shared w/ patient that we all need help sometimes but unless we let other people know about our struggles/seek support that we aren't able to get the support they may be able to provide.   Pt may be eligible for additional financial assistance through CARES program and she is okay with me sending another referral to their program. I also let pt know that I would reach out to housing coalition and send her information for all referrals once they have been made. Pt worried she is missing a [employer] appointment today- I unfortunately do not have access to their appointment scheduling but pt does have number for non profit and will call them.                                   HRT/VAS Care Coordination    Patients Home Cardiology Office [facility]   Outpatient Care Team Social Worker   Social Worker Name: [name] [place], [phone]   Living arrangements for the past 2 months Hotel/Motel   Lives with: Self   Patient Current Insurance Coverage Medicaid Pending   Patient Has Concern With Paying Medical Bills Yes   Patient Concerns With Medical Bills prior hospitalizations, ongoing medical bills   Medical Bill Referrals: Medicaid app pending w/ [facility];  CAFA app provided   Does Patient Have Prescription Coverage? No   Patient Prescription Assistance Programs [place] Card; [place] [employer]  pt provided w/ applications   Home Assistive Devices/Equipment None      Social History:  SDOH Screenings   Alcohol Screen: Not on file  Depression (PHQ2-9): Low Risk   . PHQ-2 Score: 0  Financial Resource Strain: High Risk  . Difficulty of Paying Living Expenses: Very hard  Food Insecurity: Food Insecurity Present  . Worried About Programme researcher, broadcasting/film/video in the Last Year: Sometimes true  . Ran Out of Food in the Last Year: Sometimes true  Housing: Medium Risk  . Last Housing Risk Score: 1  Physical Activity: Not on file  Social Connections: Not on file  Stress: Not on file  Tobacco Use: Low Risk   . Smoking Tobacco Use: Never Smoker  . Smokeless Tobacco Use: Never Used  Transportation Needs: Unmet Transportation Needs  . Lack of Transportation (Medical): Yes  . Lack of Transportation (Non-Medical): Yes    SDOH Interventions: Housing Insecurity:  Housing Interventions: [id number] Referral,Other (Comment) [employer] [facility] for [facility] and Community Studies; [employer])    Follow-up plan:   I encouraged pt to reach out to me if any other challenges arise. At this time pt has not gotten needed documentation for Medicaid and  [employer] applications given everything going on. Unfortunately I am limited in my ability to get needed documents without patient assistance. I will follow and continue to assist pt as able. I have sent the two referrals to [facility] for Housing and [employer] and Housing Coalition through [place]. I will send information through text to patient as well.

## 2021-01-16 ENCOUNTER — Telehealth: Payer: Self-pay | Admitting: Licensed Clinical Social Worker

## 2021-01-16 NOTE — Progress Notes (Signed)
Heart and Vascular Care Navigation  01/16/2021  [name] 1966-10-04 [id number]  Reason for Referral: F/u on housing referral and Medicaid/Disability applications                                                                        Assessment:                                     LCSW spoke with pt via telephone. Introduced self, role, reason for call. Pt confirmed she is feeling a bit better today. She received information that unfortunately the Housing Coalition is unable to assist her at this time and she has not yet heard from the [facility] for Housing and [employer]. She has an appointment w/ [name] from [facility] today.   HRT/VAS Care Coordination    Patients Home Cardiology Office [facility]   Outpatient Care Team Social Worker   Social Worker Name: [name] [place], [phone]   Living arrangements for the past 2 months Hotel/Motel   Lives with: Self   Patient Current Insurance Coverage Medicaid Pending   Patient Has Concern With Paying Medical Bills Yes   Patient Concerns With Medical Bills prior hospitalizations, ongoing medical bills   Medical Bill Referrals: Medicaid app pending w/ [facility];  CAFA app provided   Does Patient Have Prescription Coverage? No   Patient Prescription Assistance Programs [place] Card; [place] [employer]  pt provided w/ applications   Home Assistive Devices/Equipment None      Social History:                                                                             SDOH Screenings   Alcohol Screen: Not on file  Depression (PHQ2-9): Low Risk   . PHQ-2 Score: 0  Financial Resource Strain: High Risk  . Difficulty of Paying Living Expenses: Very hard  Food Insecurity: Food Insecurity Present  . Worried About Programme researcher, broadcasting/film/video in the Last Year: Sometimes true  . Ran Out of Food in the Last Year: Sometimes true  Housing: Medium Risk  . Last Housing Risk Score: 1  Physical Activity: Not  on file  Social Connections: Not on file  Stress: Not on file  Tobacco Use: Low Risk   . Smoking Tobacco Use: Never Smoker  . Smokeless Tobacco Use: Never Used  Transportation Needs: Unmet Transportation Needs  . Lack of Transportation (Medical): Yes  . Lack of Transportation (Non-Medical): Yes    SDOH Interventions: Financial Resources:  Financial Strain Interventions: Other (Comment) [facility]; [facility] for Housing and Community Studies)  Housing Insecurity:  Housing Interventions: Other (Comment) [facility] for Housing and Community Studies)   Follow-up plan:   Confirmed to pt and [name] that I called for bills on 3/11, they had them sent to email on file [email or web address]. I provided  billing number [phone]. I will f/u with [employer] as able regarding additional assistance.

## 2021-01-17 ENCOUNTER — Telehealth: Payer: Self-pay | Admitting: Licensed Clinical Social Worker

## 2021-01-17 MED FILL — ?glipiZIDE 10MG TABLETS: 10 | 30 days supply | Qty: 60 | Fill #1

## 2021-01-17 MED FILL — ?TRULICITY 0.75 MG/0.5ML PE: 0.75 | 28 days supply | Qty: 2 | Fill #1

## 2021-01-17 MED FILL — ?CLOPIDOGREL 75MG TABL: 75 | 30 days supply | Qty: 30 | Fill #1

## 2021-01-17 NOTE — Telephone Encounter (Signed)
Pt updated this Clinical research associate that she was not able to receive the bills to her email and has now requested that they be mailed to her w/ the assistance of [employer] staff. She is aware of upcoming appointments at [facility] and [employer].   Octavio Graves, MSW, LCSW [facility] Health Heart/Vascular Care Navigation  [phone]

## 2021-01-22 ENCOUNTER — Ambulatory Visit: Payer: Self-pay | Admitting: Pharmacist

## 2021-01-24 ENCOUNTER — Telehealth: Payer: Self-pay | Admitting: Licensed Clinical Social Worker

## 2021-01-24 NOTE — Telephone Encounter (Signed)
Followed up with pt regarding appointment on Monday. Pt able to come and aware of appointment. She has received bank account paperwork requested by [facility]. She is still awaiting additional paperwork from billing office at [facility] to send in. I offered to assist pt with getting this paperwork sent to [facility] if wanted on Monday. Will f/u with pt at that time.  Octavio Graves, MSW, LCSW [facility] Health Heart/Vascular Care Navigation  [phone]
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
## 2021-01-28 ENCOUNTER — Encounter: Payer: Self-pay | Admitting: Cardiology

## 2021-01-28 ENCOUNTER — Other Ambulatory Visit: Payer: Self-pay | Admitting: Cardiology

## 2021-01-28 ENCOUNTER — Other Ambulatory Visit: Payer: Self-pay | Admitting: *Deleted

## 2021-01-28 ENCOUNTER — Other Ambulatory Visit: Payer: Self-pay

## 2021-01-28 ENCOUNTER — Ambulatory Visit (INDEPENDENT_AMBULATORY_CARE_PROVIDER_SITE_OTHER): Payer: Self-pay | Admitting: Cardiology

## 2021-01-28 VITALS — BP 130/68 | HR 65 | Ht 65.0 in | Wt 204.2 lb

## 2021-01-28 DIAGNOSIS — E785 Hyperlipidemia, unspecified: Secondary | ICD-10-CM

## 2021-01-28 DIAGNOSIS — I2510 Atherosclerotic heart disease of native coronary artery without angina pectoris: Secondary | ICD-10-CM

## 2021-01-28 DIAGNOSIS — I10 Essential (primary) hypertension: Secondary | ICD-10-CM

## 2021-01-28 MED ORDER — CARVEDILOL 25 MG PO TABS: 25.0000 mg | ORAL_TABLET | Freq: Two times a day (BID) | ORAL | 3 refills | Status: DC

## 2021-01-28 MED ORDER — CLOPIDOGREL BISULFATE 75 MG PO TABS: 75.0000 mg | ORAL_TABLET | Freq: Every day | ORAL | 3 refills | Status: DC

## 2021-01-28 MED ORDER — LISINOPRIL-HYDROCHLOROTHIAZIDE 20-25 MG PO TABS: 1.0000 | ORAL_TABLET | Freq: Every day | ORAL | 3 refills | Status: DC

## 2021-01-28 MED ORDER — ROSUVASTATIN CALCIUM 40 MG PO TABS: 40.0000 mg | ORAL_TABLET | Freq: Every day | ORAL | 3 refills | Status: DC

## 2021-01-28 MED FILL — METHOCARBAMOL 500 MG TABS: 500 | 20 days supply | Qty: 60 | Fill #1

## 2021-01-28 MED FILL — ROSUVASTATIN CALCIUM 40 MG: 40 | 30 days supply | Qty: 30 | Fill #0

## 2021-01-28 MED FILL — LISINOPRIL-HYDROCHLOROTHIAZ: 20-25 | 30 days supply | Qty: 30 | Fill #0

## 2021-01-28 MED FILL — ?glipiZIDE 10MG TABLETS: 10 | 30 days supply | Qty: 60 | Fill #1

## 2021-01-28 MED FILL — ?CARVEDILOL 25 MG TABLET: 25 | 30 days supply | Qty: 60 | Fill #0

## 2021-01-28 MED FILL — ?TRULICITY 0.75 MG/0.5ML PE: 0.75 | 28 days supply | Qty: 2 | Fill #1

## 2021-01-28 MED FILL — ?CLOPIDOGREL 75MG TABL: 75 | 30 days supply | Qty: 30 | Fill #0

## 2021-01-28 NOTE — Progress Notes (Addendum)
Heart and Vascular Care Navigation  01/28/2021  [name] 10/29/1966 [id number]  Reason for Referral:    Engaged with patient face to face for follow up visit for Heart and Vascular Care Coordination.                                                                                                   Assessment:          LCSW met with pt at end of her appointment. Pt shared w/ Dr. Schumann and his RN Hayley that she is out of medications. LCSW had previously counseled pt on how to fill/obtain assistance w/ prescriptions at [facility]. Pt had previously been given NCMedAssist application, did not bring to appointment. I printed off another application for her and she completed application w/ me while she was in the office. I shared that she does need to have her daughter complete the letter of support and assisted her with sending transcript request to IRS since pt shares she did file taxes last year. LCSW again let pt know that we could assist w/ covering medications this time but that we cannot provide continuous financial assistance unless pt works on completing these more long term assistance program applications. On the top of the application I wrote for pt to inquire about Blue Card and ask for Jane, RNCM, when she went to [facility] to pick up medications. Pt requested my assistance w/ sending her bank/card statements to Chelsea at [facility] who is assisting w/ Medicaid and disability applications. LCSW securely sent them via email and fax and they were confirmed received. LCSW assisted pt with scheduling ride from office as she did not have her phone. LCSW let pt know I would reach out to her when medications ready to be picked up, pt confirmed this was okay.                               HRT/VAS Care Coordination    Patients Home Cardiology Office [facility]   Outpatient Care Team Social Worker   Social Worker Name: Isabel Chasse, LCSW, [facility], [phone]   Living  arrangements for the past 2 months Hotel[employer]el   Lives with: Self   Patient Current Insurance Coverage Medicaid Pending   Patient Has Concern With Paying Medical Bills Yes   Patient Concerns With Medical Bills prior hospitalizations, ongoing medical bills   Medical Bill Referrals: Medicaid app pending w/ [facility];  CAFA app provided   Does Patient Have Prescription Coverage? No   Patient Prescription Assistance Programs Blue Card; [place] [facility]  pt provided w/ applications   Home Assistive Devices/Equipment None      Social History:                                                                               SDOH Screenings   Alcohol Screen: Not on file  Depression (PHQ2-9): Low Risk   . PHQ-2 Score: 0  Financial Resource Strain: High Risk  . Difficulty of Paying Living Expenses: Very hard  Food Insecurity: Food Insecurity Present  . Worried About Running Out of Food in the Last Year: Sometimes true  . Ran Out of Food in the Last Year: Sometimes true  Housing: Medium Risk  . Last Housing Risk Score: 1  Physical Activity: Not on file  Social Connections: Not on file  Stress: Not on file  Tobacco Use: Low Risk   . Smoking Tobacco Use: Never Smoker  . Smokeless Tobacco Use: Never Used  Transportation Needs: Unmet Transportation Needs  . Lack of Transportation (Medical): Yes  . Lack of Transportation (Non-Medical): Yes    SDOH Interventions: Financial Resources:  Financial Strain Interventions: Other (Comment) (referral again for Blue Card)     Other Care Navigation Interventions:     Provided Pharmacy assistance resources  paid for medications for cardiology at [facility], confirmed pt does get some medications covered for free by [facility], encouraged pt to f/u about Blue Card   Follow-up plan:   LCSW sent paperwork to Chelsea, she shared that pt has additional paperwork that needs to be completed and pt no showed for her appointment there. LCSW also spoke with [facility]  pharmacy and confirmed we would cover medications and they shared that pt had stopped by but not asked about Blue Card as this writer had requested she do so. They confirmed that of the four medications pt has ready, two are free and two cost- she is eligible for [facility] through the state and some of her medications should be covered by that program and Blue Card could help with others. LCSW attempted to call pt to inform of above, neither her cell nor her hotel phone were able to accept calls. I sent text message with the above information and requests to f/u with me and Chelsea to pt cell phone. Our team will pay for meds and pt can pick up tomorrow. Remain available moving forward, will f/u regarding Letter of Support as needed for her NCMedAssist program.    

## 2021-01-28 NOTE — Patient Instructions (Signed)
Medication Instructions:  Your physician recommends that you continue on your current medications as directed. Please refer to the Current Medication list given to you today.  *If you need a refill on your cardiac medications before your next appointment, please call your pharmacy*  Follow-Up: At [facility], you and your health needs are our priority.  As part of our continuing mission to provide you with exceptional heart care, we have created designated Provider Care Teams.  These Care Teams include your primary Cardiologist (physician) and Advanced Practice Providers (APPs -  Physician Assistants and Nurse Practitioners) who all work together to provide you with the care you need, when you need it.  We recommend signing up for the patient portal called "MyChart".  Sign up information is provided on this After Visit Summary.  MyChart is used to connect with patients for Virtual Visits (Telemedicine).  Patients are able to view lab/test results, encounter notes, upcoming appointments, etc.  Non-urgent messages can be sent to your provider as well.   To learn more about what you can do with MyChart, go to [email or web address].    Your next appointment:   3 month(s)  The format for your next appointment:   In Person  Provider:   Christopher Schumann, MD    

## 2021-01-29 ENCOUNTER — Telehealth: Payer: Self-pay | Admitting: Licensed Clinical Social Worker

## 2021-01-29 NOTE — Telephone Encounter (Signed)
LCSW attempted to reach pt again this morning to let her know medications available, and that [facility] has additional paperwork that needs to be completed. Pt did not answer phone but I was able to leave HIPAA compliant message at [phone]. I also have let [facility] know that pt voicemail is once again up and working.   Octavio Graves, MSW, LCSW [facility] Health Heart/Vascular Care Navigation  [phone]

## 2021-01-30 ENCOUNTER — Telehealth: Payer: Self-pay | Admitting: Licensed Clinical Social Worker

## 2021-01-30 NOTE — Telephone Encounter (Signed)
LCSW called and was able to reach pt this morning- she is on her way to an appointment w/ [facility]. She shares that I am on speakerphone but that it is only her there. I reminded her that medications are ready at [facility] pharmacy. I shared that we had paid for two medications but some of her medications are covered under [facility]. Pt was not aware of this- I encouraged her to review her list of medications w/ pharmacy staff to understand which ones are covered and which ones she is responsible for. I also inquired if pt has been able to give the letter of support to her daughter, she says she has and I requested she bring those to the [facility] office as soon as she is able to. Pt states understanding and will call me if any additional questions/concerns. If I do not receive letter by next week I will f/u again with pt.   Octavio Graves, MSW, LCSW [facility] Health Heart/Vascular Care Navigation  [phone]

## 2021-01-31 ENCOUNTER — Ambulatory Visit: Payer: Self-pay | Admitting: Pharmacist

## 2021-02-04 ENCOUNTER — Telehealth: Payer: Self-pay | Admitting: Licensed Clinical Social Worker

## 2021-02-04 NOTE — Telephone Encounter (Signed)
Was able to reach pt via text message to [phone]. I inquired if she had been able to pick up her meds at the [facility], she texted back yes. I also reminded her again that in order for [place] to submit NCMedAssist application pt will need to bring completed letter of assistance from daughter to office. Pt again texted yes. I will f/u next Monday if I do not hear from pt prior to that time.   Octavio Graves, MSW, LCSW [facility] Health Heart/Vascular Care Navigation  [phone]

## 2021-02-06 ENCOUNTER — Telehealth: Payer: Self-pay | Admitting: Licensed Clinical Social Worker

## 2021-02-06 NOTE — Telephone Encounter (Signed)
LCSW received patient letter of support from front desk where pt had dropped it off this afternoon. Pt application submitted to [employer]ed[employer]ist, will f/u with [place] [facility] if I do not hear back by the end of the week.   Octavio Graves, MSW, LCSW [facility] Health Heart/Vascular Care Navigation  [phone]

## 2021-02-13 ENCOUNTER — Telehealth: Payer: Self-pay | Admitting: Licensed Clinical Social Worker

## 2021-02-13 ENCOUNTER — Other Ambulatory Visit: Payer: Self-pay | Admitting: *Deleted

## 2021-02-13 MED ORDER — CARVEDILOL 25 MG PO TABS: ORAL_TABLET | Freq: Two times a day (BID) | ORAL | 3 refills | Status: DC

## 2021-02-13 MED ORDER — LISINOPRIL-HYDROCHLOROTHIAZIDE 20-25 MG PO TABS
1.0000 | ORAL_TABLET | Freq: Every day | ORAL | 3 refills | Status: DC
Start: 2021-02-13 — End: 2022-03-10

## 2021-02-13 MED ORDER — CLOPIDOGREL BISULFATE 75 MG PO TABS: 75.0000 mg | ORAL_TABLET | Freq: Every day | ORAL | 3 refills | Status: DC

## 2021-02-13 NOTE — Telephone Encounter (Signed)
LCSW had called and not reached pt to update her, I texted her to let her know of approval for MedAssist. Pt responded back that she understands and has additional paperwork for [facility]. Pt also states in message that "Ms. Sharol Harness said Chenelle suppose to be doing all of this for me."   I am unfamiliar w/ a Ms. Simmons or [employer]. LCSW shared that I had sent bank statements directly to [facility] at [facility] and apologized that I was unable to further assist with obtaining additional paperwork. LCSW attempted to call pt again and was able to reach her this afternoon. I clarified that Ms. Sharol Harness is her DSS caseworker and that Dagoberto Reef was a typo for [employer]. Pt upset b/c she was told by DSS that "she's very sick and [name] should be doing all of this for her." I shared w/ pt that there may be times where [name] is unable to request/call for a particular document and will need pt to assist with completing/signing a document. I acknowledged pt frustration that this process takes longer than is ideal but that if she has any additional questions about her disability or Medicaid applications to please contact Chelsea directly and to ensure she stays in contact with their team. Pt states understanding.   She shares she is having some discomfort "where my stents are." LCSW shared pt should seek advice from the nurse triage line. I will send her the number to contact. A list of her medications and where she should call for questions/refills has been mailed to her.   At this time LCSW has connected pt w/ financial assistance through DSS, Medicaid and Disability (pending w/ [facility] assistance), Transportation Services, [employer] and their pharmacy and [employer]edAssist.   Octavio Graves, MSW, LCSW [facility] Health Heart/Vascular Care Navigation  [phone]

## 2021-02-13 NOTE — Telephone Encounter (Signed)
Pt approved for NCMedAssist through 01/01/2022. I have requested Hayley, RN's assistance w/ sending scripts for carvedilol, clopidogrel bisulfate, lisinopril-hydrochlorothiazide. I have also reached out to [place], [facility] at [facility] to see if she can assist w/ medications managed by Dr. Alvis Lemmings to be sent to [facility].   Octavio Graves, MSW, LCSW [facility] Health Heart/Vascular Care Navigation  [phone]

## 2021-02-13 NOTE — Telephone Encounter (Signed)
Received call back from [facility]. She shares she is in the office today and has not heard from pt but is open to pt bringing by paperwork if needed. She clarifies that she has attempted to send information to DSS but it was not received on online system, she has mailed it to them. She confirms should pt have any additional questions to call her directly. I also shared that I had noticed this writer's name on documentation when disability filed, she shares that it was as someone who could attest to pt function. LCSW shared that unfortunately I do not have first hand knowledge of pt functioning outside of the office therefore am not going to be able to attest to that, [name] will contact Programmer, systems for pt and alert them that they may need to contact pt for another family member or friend that could attest to that on behalf of pt. She confirms request for documentation has been sent to pt physicians offices and I note them from medical records. I let Chelsea know to reach out to me should I be of any assistance moving forward.   Octavio Graves, MSW, LCSW [facility] Heart/Vascular Care Navigation  [phone]

## 2021-02-14 ENCOUNTER — Telehealth: Payer: Self-pay

## 2021-02-14 ENCOUNTER — Other Ambulatory Visit: Payer: Self-pay | Admitting: Pharmacist

## 2021-02-14 DIAGNOSIS — E1159 Type 2 diabetes mellitus with other circulatory complications: Secondary | ICD-10-CM

## 2021-02-14 DIAGNOSIS — M19041 Primary osteoarthritis, right hand: Secondary | ICD-10-CM

## 2021-02-14 MED ORDER — IPRATROPIUM BROMIDE 0.06 % NA SOLN: 2.0000 | Freq: Four times a day (QID) | NASAL | 0 refills | Status: DC | PRN

## 2021-02-14 MED ORDER — DULAGLUTIDE 0.75 MG/0.5ML ~~LOC~~ SOAJ: 0.7500 mg | SUBCUTANEOUS | 1 refills | Status: AC

## 2021-02-14 MED ORDER — DICLOFENAC SODIUM 1 % EX GEL: 4.0000 g | Freq: Four times a day (QID) | CUTANEOUS | 0 refills | Status: AC

## 2021-02-14 MED ORDER — GLIPIZIDE 10 MG PO TABS: 10.0000 mg | ORAL_TABLET | Freq: Two times a day (BID) | ORAL | 1 refills | Status: DC

## 2021-02-14 MED ORDER — CETIRIZINE HCL 10 MG PO TABS: 10.0000 mg | ORAL_TABLET | Freq: Every day | ORAL | 1 refills | Status: DC

## 2021-02-14 NOTE — Telephone Encounter (Signed)
At request of Octavio Graves, LCSW,  [place], [facility] transferred the following prescriptions to [facility] [place]: Cetirizine Diclofenac Sodium Dulaglutide Glipizide Ipratropium Bromide

## 2021-02-19 ENCOUNTER — Telehealth: Payer: Self-pay | Admitting: Licensed Clinical Social Worker

## 2021-02-19 NOTE — Telephone Encounter (Signed)
Received a call from disability specialist Chelsea at [facility]. She has been trying to reach pt but has been unable since last week and didn't know if patient had reached out to me. Have not spoken on phone with pt since last week. I attempted to reach pt while on phone with [facility] and was unsuccessful. Sent text to pt with Chelsea's number and reminder to get in touch with her regarding ongoing applications (Medicaid and Disability).   Octavio Graves, MSW, LCSW [facility] Heart/Vascular Care Navigation  [phone]

## 2021-03-05 ENCOUNTER — Ambulatory Visit: Payer: Self-pay | Admitting: Family Medicine

## 2021-03-07 ENCOUNTER — Other Ambulatory Visit: Payer: Self-pay | Admitting: Family Medicine

## 2021-03-07 ENCOUNTER — Encounter: Payer: Self-pay | Admitting: Family Medicine

## 2021-03-07 ENCOUNTER — Other Ambulatory Visit: Payer: Self-pay

## 2021-03-07 DIAGNOSIS — M25551 Pain in right hip: Secondary | ICD-10-CM

## 2021-03-07 MED ORDER — METHOCARBAMOL 500 MG PO TABS
500.0000 mg | ORAL_TABLET | Freq: Three times a day (TID) | ORAL | 0 refills | Status: DC | PRN
  Filled 2021-03-07: qty 60, 20d supply, fill #0

## 2021-03-07 MED FILL — Rosuvastatin Calcium Tab 40 MG: ORAL | 30 days supply | Qty: 30 | Fill #0 | Status: CN

## 2021-03-07 MED FILL — Nitroglycerin SL Tab 0.4 MG: SUBLINGUAL | 10 days supply | Qty: 25 | Fill #0 | Status: CN

## 2021-03-07 NOTE — Telephone Encounter (Signed)
Requested medication (s) are due for refill today: yes  Requested medication (s) are on the active medication list:yes   Last refill:  01/28/2021  Future visit scheduled: no  Notes to clinic:  this refill cannot be delegated    Requested Prescriptions  Pending Prescriptions Disp Refills   methocarbamol (ROBAXIN) 500 MG tablet 60 tablet 1    Sig: TAKE 1 TABLET (500 MG TOTAL) BY MOUTH EVERY 8 (EIGHT) HOURS AS NEEDED FOR MUSCLE SPASMS.      Not Delegated - Analgesics:  Muscle Relaxants Failed - 03/07/2021  8:37 AM      Failed - This refill cannot be delegated      Passed - Valid encounter within last 6 months    Recent Outpatient Visits           3 months ago Type 2 diabetes mellitus with other circulatory complication, without long-term current use of insulin (HCC)   [facility] [place], [place], MD   9 months ago Type 2 diabetes mellitus with other circulatory complication, without long-term current use of insulin [facility])   [facility] [employer], Cornelius Moras, RPH-CPP   9 months ago Encounter for medication review and counseling   [facility] [place], Jeannett Senior L, RPH-CPP   9 months ago Type 2 diabetes mellitus with other circulatory complication, without long-term current use of insulin (HCC)   [facility] [place], Odette Horns, MD   1 year ago Type 2 diabetes mellitus with other circulatory complication, without long-term current use of insulin [facility])   [facility] [facility] Hoy Register, MD       Future Appointments             In 2 months Hilyard Ishikawa, MD [facility] [place], [facility]

## 2021-03-08 ENCOUNTER — Other Ambulatory Visit: Payer: Self-pay

## 2021-03-13 ENCOUNTER — Other Ambulatory Visit: Payer: Self-pay

## 2021-03-14 ENCOUNTER — Other Ambulatory Visit: Payer: Self-pay

## 2021-03-14 ENCOUNTER — Telehealth: Payer: Self-pay | Admitting: Licensed Clinical Social Worker

## 2021-03-14 ENCOUNTER — Other Ambulatory Visit: Payer: Self-pay | Admitting: *Deleted

## 2021-03-14 MED FILL — Rosuvastatin Calcium Tab 40 MG: ORAL | 30 days supply | Qty: 30 | Fill #0 | Status: CN

## 2021-03-14 MED FILL — Nitroglycerin SL Tab 0.4 MG: SUBLINGUAL | 30 days supply | Qty: 25 | Fill #0 | Status: CN

## 2021-03-14 NOTE — Telephone Encounter (Signed)
LCSW spoke with [facility] at [facility], confirmed pt denied as she was not found to be disabled by DDS therefore is not eligible for Medicaid or Disability at this time. Confirmed w/ Shanda Bumps, financial counseling that she is eligible to apply for CAFA (although no outstanding hospital bills) and [employer]. I sent message back to pt with the above, she requests I mail her applications. Applications and supporting documentation needs sent to pt with highlighted instructions included. Pt able to meet with financial counselor at [facility] as she is a pt there.   Noted that pt had called for refills, spoke with RN [facility] and it appears pt has refills available for Crestor she just needs to call pharmacy.   Octavio Graves, MSW, LCSW [facility] Heart/Vascular Care Navigation  [phone]

## 2021-03-14 NOTE — Progress Notes (Signed)
Heart and Vascular Care Navigation  03/14/2021  [name] 19-May-1966 [id number]  Reason for Referral:  Engaged with patient by telephone for follow up visit for Heart and Vascular Care Coordination.                                                                                                   Assessment:       LCSW received text message from pt stating she was denied for Medicaid and Disability on 03/08/2021. LCSW offered to provide pt with CAFA form and make appt with financial counselor at [facility]. Pt states she was denied for that too. LCSW offered to check with financial counseling dept at [facility] as I do not think pt had applied recently for CAFA. Message sent to financial counselor Christia Reading.                                  HRT/VAS Care Coordination    Patients Home Cardiology Office [facility]   Outpatient Care Team Social Worker   Social Worker Name: Sandra Graves, LCSW, [facility]   Living arrangements for the past 2 months Hotel[employer]Motel   Lives with: Self   Patient Current Insurance Coverage Self-Pay   Patient Has Concern With Paying Medical Bills Yes   Patient Concerns With Medical Bills denied for Medicaid, ongoing medical work up   Medical Bill Referrals: CAFA   Does Patient Have Prescription Coverage? No   Patient Prescription Assistance Programs [place] [facility]; Blue Card   Blue Card Medications pt approved for [facility] medications   [place] [facility] Medications pt approved for [place] [facility] through 01/01/2022   Home Assistive Devices/Equipment None      Social History:                                                                             SDOH Screenings   Alcohol Screen: Not on file  Depression (PHQ2-9): Low Risk   . PHQ-2 Score: 0  Financial Resource Strain: High Risk  . Difficulty of Paying Living Expenses: Very hard  Food Insecurity: Food Insecurity Present  . Worried About Programme researcher, broadcasting/film/video in the Last Year: Sometimes true  . Ran Out of  Food in the Last Year: Sometimes true  Housing: Medium Risk  . Last Housing Risk Score: 1  Physical Activity: Not on file  Social Connections: Not on file  Stress: Not on file  Tobacco Use: Low Risk   . Smoking Tobacco Use: Never Smoker  . Smokeless Tobacco Use: Never Used  Transportation Needs: Unmet Transportation Needs  . Lack of Transportation (Medical): Yes  . Lack of Transportation (Non-Medical): Yes    SDOH Interventions: Financial Resources:  Financial Strain Interventions: Chiropodist (Comment) (  pt denied for Medicaid and Disability on 03/08/2021- referred for CAFA) Financial Counseling for [employer] Program    Other Care Navigation Interventions:     Provided Pharmacy assistance resources [place] [facility],Blue Card   Follow-up plan:   If pt eligible I will send her [facility] Financial Assistance form/schedule a time for her to meet with financial counselor to review application if desired. Pt has my number for any additional questions/concerns.

## 2021-03-16 ENCOUNTER — Encounter (HOSPITAL_COMMUNITY): Payer: Self-pay

## 2021-03-16 ENCOUNTER — Ambulatory Visit (HOSPITAL_COMMUNITY)
Admission: EM | Admit: 2021-03-16 | Discharge: 2021-03-16 | Disposition: A | Discharge status: Home / Self Care | Payer: Self-pay | Attending: Medical Oncology | Admitting: Medical Oncology

## 2021-03-16 ENCOUNTER — Other Ambulatory Visit: Payer: Self-pay

## 2021-03-16 DIAGNOSIS — R112 Nausea with vomiting, unspecified: Secondary | ICD-10-CM | POA: Insufficient documentation

## 2021-03-16 DIAGNOSIS — M545 Low back pain, unspecified: Secondary | ICD-10-CM | POA: Insufficient documentation

## 2021-03-16 DIAGNOSIS — R1032 Left lower quadrant pain: Secondary | ICD-10-CM | POA: Insufficient documentation

## 2021-03-16 DIAGNOSIS — R197 Diarrhea, unspecified: Secondary | ICD-10-CM | POA: Insufficient documentation

## 2021-03-16 LAB — POCT URINALYSIS DIPSTICK, ED / UC
Bilirubin Urine: NEGATIVE
Glucose, UA: 100 mg/dL — AB
Hgb urine dipstick: NEGATIVE
Ketones, ur: NEGATIVE mg/dL
Nitrite: NEGATIVE
Protein, ur: NEGATIVE mg/dL
Specific Gravity, Urine: >=1.03 (ref 1.005–1.030)
Urobilinogen, UA: 0.2 mg/dL (ref 0.0–1.0)
pH: 5.5 (ref 5.0–8.0)
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
## 2021-03-16 NOTE — Discharge Instructions (Addendum)
Please go to the emergency room for further workup.

## 2021-03-16 NOTE — ED Triage Notes (Signed)
Pt in with c/o left side upper and lower back pain x 4 days  Pt states she has also been vomiting

## 2021-03-17 ENCOUNTER — Encounter (HOSPITAL_COMMUNITY): Payer: Self-pay

## 2021-03-17 ENCOUNTER — Emergency Department (HOSPITAL_COMMUNITY): Payer: Self-pay

## 2021-03-17 ENCOUNTER — Emergency Department (HOSPITAL_COMMUNITY)
Admission: EM | Admit: 2021-03-17 | Discharge: 2021-03-17 | Disposition: A | Discharge status: Home / Self Care | Payer: Self-pay | Attending: Emergency Medicine | Admitting: Emergency Medicine

## 2021-03-17 ENCOUNTER — Other Ambulatory Visit: Payer: Self-pay

## 2021-03-17 DIAGNOSIS — R110 Nausea: Secondary | ICD-10-CM | POA: Insufficient documentation

## 2021-03-17 DIAGNOSIS — R197 Diarrhea, unspecified: Secondary | ICD-10-CM | POA: Insufficient documentation

## 2021-03-17 DIAGNOSIS — E119 Type 2 diabetes mellitus without complications: Secondary | ICD-10-CM | POA: Insufficient documentation

## 2021-03-17 DIAGNOSIS — R109 Unspecified abdominal pain: Secondary | ICD-10-CM

## 2021-03-17 DIAGNOSIS — Z7984 Long term (current) use of oral hypoglycemic drugs: Secondary | ICD-10-CM | POA: Insufficient documentation

## 2021-03-17 DIAGNOSIS — I2510 Atherosclerotic heart disease of native coronary artery without angina pectoris: Secondary | ICD-10-CM | POA: Insufficient documentation

## 2021-03-17 DIAGNOSIS — Z7902 Long term (current) use of antithrombotics/antiplatelets: Secondary | ICD-10-CM | POA: Insufficient documentation

## 2021-03-17 DIAGNOSIS — R1012 Left upper quadrant pain: Secondary | ICD-10-CM | POA: Insufficient documentation

## 2021-03-17 DIAGNOSIS — R1013 Epigastric pain: Secondary | ICD-10-CM | POA: Insufficient documentation

## 2021-03-17 DIAGNOSIS — R1032 Left lower quadrant pain: Secondary | ICD-10-CM | POA: Insufficient documentation

## 2021-03-17 DIAGNOSIS — I10 Essential (primary) hypertension: Secondary | ICD-10-CM | POA: Insufficient documentation

## 2021-03-17 DIAGNOSIS — Z79899 Other long term (current) drug therapy: Secondary | ICD-10-CM | POA: Insufficient documentation

## 2021-03-17 LAB — COMPREHENSIVE METABOLIC PANEL
ALT: 37 U/L (ref 0–44)
AST: 27 U/L (ref 15–41)
Albumin: 3.7 g/dL (ref 3.5–5.0)
Alkaline Phosphatase: 63 U/L (ref 38–126)
Anion gap: 8 (ref 5–15)
BUN: 24 mg/dL — ABNORMAL HIGH (ref 6–20)
CO2: 27 mmol/L (ref 22–32)
Calcium: 9.5 mg/dL (ref 8.9–10.3)
Chloride: 101 mmol/L (ref 98–111)
Creatinine, Ser: 0.93 mg/dL (ref 0.44–1.00)
GFR, Estimated: >60 mL/min (ref >60)
Glucose, Bld: 449 mg/dL — ABNORMAL HIGH (ref 70–99)
Potassium: 4.5 mmol/L (ref 3.5–5.1)
Sodium: 136 mmol/L (ref 135–145)
Total Bilirubin: 0.6 mg/dL (ref 0.3–1.2)
Total Protein: 6.6 g/dL (ref 6.5–8.1)

## 2021-03-17 LAB — CBC WITH DIFFERENTIAL/PLATELET
Abs Immature Granulocytes: 0.02 10*3/uL (ref 0.00–0.07)
Basophils Absolute: 0 10*3/uL (ref 0.0–0.1)
Basophils Relative: 1 %
Eosinophils Absolute: 0.1 10*3/uL (ref 0.0–0.5)
Eosinophils Relative: 2 %
HCT: 37.1 % (ref 36.0–46.0)
Hemoglobin: 12.2 g/dL (ref 12.0–15.0)
Immature Granulocytes: 0 %
Lymphocytes Relative: 37 %
Lymphs Abs: 2.1 10*3/uL (ref 0.7–4.0)
MCH: 31.5 pg (ref 26.0–34.0)
MCHC: 32.9 g/dL (ref 30.0–36.0)
MCV: 95.9 fL (ref 80.0–100.0)
Monocytes Absolute: 0.3 10*3/uL (ref 0.1–1.0)
Monocytes Relative: 5 %
Neutro Abs: 3.1 10*3/uL (ref 1.7–7.7)
Neutrophils Relative %: 55 %
Platelets: 220 10*3/uL (ref 150–400)
RBC: 3.87 MIL/uL (ref 3.87–5.11)
RDW: 12.6 % (ref 11.5–15.5)
WBC: 5.7 10*3/uL (ref 4.0–10.5)
nRBC: 0 % (ref 0.0–0.2)

## 2021-03-17 LAB — LIPASE, BLOOD: Lipase: 54 U/L — ABNORMAL HIGH (ref 11–51)

## 2021-03-17 LAB — TROPONIN I (HIGH SENSITIVITY)
Troponin I (High Sensitivity): 5 ng/L (ref <18)
Troponin I (High Sensitivity): 7 ng/L (ref <18)

## 2021-03-17 LAB — CBG MONITORING, ED: Glucose-Capillary: 306 mg/dL — ABNORMAL HIGH (ref 70–99)

## 2021-03-17 MED ORDER — SODIUM CHLORIDE 0.9 % IV BOLUS
1000.0000 mL | Freq: Once | INTRAVENOUS | Status: AC
  Administered 2021-03-17: 1000 mL via INTRAVENOUS

## 2021-03-17 MED ORDER — METHOCARBAMOL 500 MG PO TABS: 1000.0000 mg | ORAL_TABLET | Freq: Four times a day (QID) | ORAL | 0 refills | Status: DC

## 2021-03-17 MED ORDER — FENTANYL CITRATE (PF) 100 MCG/2ML IJ SOLN
50.0000 ug | Freq: Once | INTRAMUSCULAR | Status: AC
  Administered 2021-03-17: 50 ug via INTRAVENOUS
  Filled 2021-03-17: qty 2

## 2021-03-17 MED ORDER — ONDANSETRON HCL 4 MG/2ML IJ SOLN
4.0000 mg | Freq: Once | INTRAMUSCULAR | Status: AC
  Administered 2021-03-17: 4 mg via INTRAVENOUS
  Filled 2021-03-17: qty 2

## 2021-03-17 NOTE — Discharge Instructions (Signed)
Please read and follow all provided instructions.  Your diagnoses today include:  1. Flank pain     Tests performed today include:  Blood cell counts and platelets  Kidney and liver function tests  Pancreas function test (called lipase)  Urine test to look for infection  CT scan of the abdomen - no acute problems seen  EKG/cardiac enzymes - no signs of a heart attack or stress on the heart today  Vital signs. See below for your results today.   Medications prescribed:   Robaxin (methocarbamol) - muscle relaxer medication  DO NOT drive or perform any activities that require you to be awake and alert because this medicine can make you drowsy.   Take any prescribed medications only as directed.  Home care instructions:   Follow any educational materials contained in this packet.  Follow-up instructions: Please follow-up with your primary care provider in the next 3 days for further evaluation of your symptoms.    Return instructions:  SEEK IMMEDIATE MEDICAL ATTENTION IF:  The pain does not go away or becomes severe   A temperature above 101F develops   Repeated vomiting occurs (multiple episodes)   The pain becomes localized to portions of the abdomen. The right side could possibly be appendicitis. In an adult, the left lower portion of the abdomen could be colitis or diverticulitis.   Blood is being passed in stools or vomit (bright red or black tarry stools)   You develop chest pain, difficulty breathing, dizziness or fainting, or become confused, poorly responsive, or inconsolable (young children)  If you have any other emergent concerns regarding your health  Additional Information: Abdominal (belly) pain can be caused by many things. Your caregiver performed an examination and possibly ordered blood/urine tests and imaging (CT scan, x-rays, ultrasound). Many cases can be observed and treated at home after initial evaluation in the emergency department. Even  though you are being discharged home, abdominal pain can be unpredictable. Therefore, you need a repeated exam if your pain does not resolve, returns, or worsens. Most patients with abdominal pain don't have to be admitted to the hospital or have surgery, but serious problems like appendicitis and gallbladder attacks can start out as nonspecific pain. Many abdominal conditions cannot be diagnosed in one visit, so follow-up evaluations are very important.  Please be aware that you were seen during a time of global shortage of iodinated contrast media. This means an alternative approach to your diagnosis and treatment may have been employed in order to provide optimal care during this shortage. If you have any worsening symptoms, please go to the nearest Emergency Department or call 911 immediately.   Your vital signs today were: BP 129/68   Pulse (!) 57   Temp 98.9 F (37.2 C) (Oral)   Resp 16   LMP 01/24/2015 (Approximate)   SpO2 98%  If your blood pressure (bp) was elevated above 135/85 this visit, please have this repeated by your doctor within one month. --------------

## 2021-03-17 NOTE — ED Provider Notes (Signed)
Emergency Medicine Provider Triage Evaluation Note  [name] , a 55 y.o. female  was evaluated in triage.  Pt complains of chest pain and abdominal pain  Review of Systems  Positive: Shortness of breath Negative: fever  Physical Exam  BP 99/60 (BP Location: Left Arm)   Pulse (!) 58   Temp 98.9 F (37.2 C) (Oral)   Resp 18   LMP 01/24/2015 (Approximate)   SpO2 99%  Gen:   Awake, no distress   Resp:  Normal effort  MSK:   Moves extremities without difficulty  Other:    Medical Decision Making  Medically screening exam initiated at 12:58 PM.  Appropriate orders placed.  [name] was informed that the remainder of the evaluation will be completed by another provider, this initial triage assessment does not replace that evaluation, and the importance of remaining in the ED until their evaluation is complete.     Elson Areas, [place] 03/18/21 1359    Tegeler, Canary Brim, MD 03/20/21 [phone]

## 2021-03-17 NOTE — ED Triage Notes (Signed)
Pt presents with Left upper and lower back pain and vomiting x4 days. Seen at [facility] yesterday, sent here for CT scan and further evaluation of her stomach. Pt was unable to stay yesterday d/t having her client with her
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
## 2021-03-18 LAB — URINE CULTURE

## 2021-03-21 ENCOUNTER — Other Ambulatory Visit: Payer: Self-pay

## 2021-04-08 ENCOUNTER — Telehealth: Payer: Self-pay | Admitting: Licensed Clinical Social Worker

## 2021-04-08 ENCOUNTER — Telehealth: Payer: Self-pay | Admitting: *Deleted

## 2021-04-08 NOTE — Telephone Encounter (Signed)
Patient requested refills on her "heart" medications  Attempted to call patient, no answer and unable to leave VM  Patient has refills on all cardiac medications.  Needs to contact pharmacy for refill.

## 2021-04-08 NOTE — Telephone Encounter (Signed)
Pt texted this Clinical research associate stating "I need to order cardiver. Heart pill they gone lost got 6 left." Deferred this message to RN Hayley- pt does have a year of refills ordered but she will call the pt to answer medication related questions/concerns. I have texted pt back and let her know the RN will call to answer her questions/assist her further with this concern.   Octavio Graves, MSW, LCSW [facility] Heart/Vascular Care Navigation  [phone]

## 2021-04-23 ENCOUNTER — Telehealth: Payer: Self-pay | Admitting: Licensed Clinical Social Worker

## 2021-04-23 NOTE — Telephone Encounter (Signed)
Pt contacted this Clinical research associate regarding needing a ride to [employer] where . LCSW shared that is not in [facility] network. We can provide 1x ride to PCP appt but pt is aware that we can only provide rides to in network [facility] providers moving forward. She asked me to text her about upcoming appt in July at [facility]. I have sent her 7/5 appt at 9:20 via text to her phone. I contacted [place] w/ Higher education careers adviser to provide one time approval.   Octavio Graves, MSW, LCSW [facility] Heart/Vascular Care Navigation  [phone]

## 2021-04-24 ENCOUNTER — Ambulatory Visit: Payer: Medicaid Other | Admitting: Family Medicine

## 2021-05-02 ENCOUNTER — Telehealth: Payer: Self-pay | Admitting: Licensed Clinical Social Worker

## 2021-05-02 NOTE — Telephone Encounter (Signed)
Pt texted that she cannot make appointment on July 5th due to having a court date.  I deferred this to scheduler who will attempt to reschedule pt. Pt then texted "I keep it think at 2. I check." I responded and let pt know that I do not manage scheduling for patients and that our scheduling team would be happy to assist her with that if she needs to reschedule an appointment. Stated that she needs to call the office directly and provided office number.    Octavio Graves, MSW, LCSW [facility] Heart/Vascular Care Navigation  [phone]
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
## 2021-05-07 ENCOUNTER — Ambulatory Visit: Payer: Self-pay | Admitting: Cardiology

## 2021-05-13 ENCOUNTER — Telehealth: Payer: Self-pay | Admitting: Licensed Clinical Social Worker

## 2021-05-13 NOTE — Telephone Encounter (Signed)
Received text from pt when I returned to the office this morning stating "I need to order my meds not that address I am homeless".   LCSW attempted to reach pt via telephone to address the above. No answer and voicemail full. I sent the following message via text back to pt- "Good morning. I am sorry to hear that. Where have you been staying/where would you like your mediations sent? Perhaps one of your children can receive them?"   At this time no answer, pt no showed the most recent [facility] appointment and the PCP appointments which had been communicated to pt previously.   Octavio Graves, MSW, LCSW [facility]  [phone]

## 2021-05-17 ENCOUNTER — Other Ambulatory Visit: Payer: Self-pay

## 2021-06-12 ENCOUNTER — Other Ambulatory Visit: Payer: Self-pay

## 2021-06-12 ENCOUNTER — Other Ambulatory Visit: Payer: Self-pay | Admitting: Family Medicine

## 2021-06-12 DIAGNOSIS — M25551 Pain in right hip: Secondary | ICD-10-CM

## 2021-06-12 DIAGNOSIS — E1159 Type 2 diabetes mellitus with other circulatory complications: Secondary | ICD-10-CM

## 2021-06-12 NOTE — Telephone Encounter (Signed)
Requested medication (s) are due for refill today: yes   Requested medication (s) are on the active medication list: yes   Last refill:  02/14/2021  Future visit scheduled:no  Notes to clinic:   overdue for follow up Message sent for patient to contact office    Requested Prescriptions  Pending Prescriptions Disp Refills   TRULICITY 0.75 MG/0.5ML SOPN [Pharmacy Med Name: Trulicity 0.75 mg/0.5 mL subcutaneous pen injector] 2 mL 1    Sig: INJECT 1 PEN UNDER THE SKIN ONCE WEEKLY      Endocrinology:  Diabetes - GLP-1 Receptor Agonists Failed - 06/12/2021  1:14 PM      Failed - HBA1C is between 0 and 7.9 and within 180 days    HbA1c, POC (controlled diabetic range)  Date Value Ref Range Status  12/06/2020 11.1 (A) 0.0 - 7.0 % Final          Failed - Valid encounter within last 6 months    Recent Outpatient Visits           6 months ago Type 2 diabetes mellitus with other circulatory complication, without long-term current use of insulin (HCC)   [facility] [place], Odette Horns, MD   1 year ago Type 2 diabetes mellitus with other circulatory complication, without long-term current use of insulin (HCC)   [place] [facility] [place], Cornelius Moras, RPH-CPP   1 year ago Encounter for medication review and counseling   [place] [facility] Drucilla Chalet, RPH-CPP   1 year ago Type 2 diabetes mellitus with other circulatory complication, without long-term current use of insulin (HCC)   [place] [facility] [place], Odette Horns, MD   1 year ago Type 2 diabetes mellitus with other circulatory complication, without long-term current use of insulin (HCC)   [place] [facility] [place], [place], MD                  glipiZIDE (GLUCOTROL) 10 MG tablet [Pharmacy Med Name: glipizide 10 mg tablet] 60 tablet 1    Sig: TAKE 1 Tablet  BY MOUTH TWICE DAILY      Endocrinology:   Diabetes - Sulfonylureas Failed - 06/12/2021  1:14 PM      Failed - HBA1C is between 0 and 7.9 and within 180 days    HbA1c, POC (controlled diabetic range)  Date Value Ref Range Status  12/06/2020 11.1 (A) 0.0 - 7.0 % Final          Failed - Valid encounter within last 6 months    Recent Outpatient Visits           6 months ago Type 2 diabetes mellitus with other circulatory complication, without long-term current use of insulin (HCC)   [facility] [place], [place], MD   1 year ago Type 2 diabetes mellitus with other circulatory complication, without long-term current use of insulin [facility])   [place] [facility] Lois [place]ux[place], Cornelius Moras, RPH-CPP   1 year ago Encounter for medication review and counseling   [place] [facility] [place], Cornelius Moras, RPH-CPP   1 year ago Type 2 diabetes mellitus with other circulatory complication, without long-term current use of insulin (HCC)   [place] [facility] [place], Odette Horns, MD   1 year ago Type 2 diabetes mellitus with other circulatory complication, without long-term current use of insulin (HCC)  [place] [facility] [place], [place], MD                  cetirizine (ZYRTEC) 10 MG tablet [Pharmacy Med Name: cetirizine 10 mg tablet] 30 tablet 1    Sig: TAKE 1 Tablet BY MOUTH ONCE DAILY      Ear, Nose, and Throat:  Antihistamines Passed - 06/12/2021  1:14 PM      Passed - Valid encounter within last 12 months    Recent Outpatient Visits           6 months ago Type 2 diabetes mellitus with other circulatory complication, without long-term current use of insulin (HCC)   [place] [facility] [place], [place], MD   1 year ago Type 2 diabetes mellitus with other circulatory complication, without long-term current use of insulin [facility])   [place] [facility] Lois Huxley, Cornelius Moras, RPH-CPP    1 year ago Encounter for medication review and counseling   [place] [facility] [place], Cornelius Moras, RPH-CPP   1 year ago Type 2 diabetes mellitus with other circulatory complication, without long-term current use of insulin (HCC)   [place] [facility] [place], Odette Horns, MD   1 year ago Type 2 diabetes mellitus with other circulatory complication, without long-term current use of insulin (HCC)    [facility] Hoy Register, MD

## 2021-06-12 NOTE — Telephone Encounter (Signed)
Requested medication (s) are due for refill today -no  Requested medication (s) are on the active medication list -yes  Future visit scheduled -no  Last refill: 03/14/21 #20  Notes to clinic: Request RF- non delegated Rx, listed as discontinued 03/17/21  Requested Prescriptions  Pending Prescriptions Disp Refills   methocarbamol (ROBAXIN) 500 MG tablet 60 tablet 0    Sig: Take 1 tablet (500 mg total) by mouth every 8 (eight) hours as needed for muscle spasms.      Not Delegated - Analgesics:  Muscle Relaxants Failed - 06/12/2021  2:25 PM      Failed - This refill cannot be delegated      Failed - Valid encounter within last 6 months    Recent Outpatient Visits           6 months ago Type 2 diabetes mellitus with other circulatory complication, without long-term current use of insulin (HCC)   [place] [facility] [place], Odette Horns, MD   1 year ago Type 2 diabetes mellitus with other circulatory complication, without long-term current use of insulin [facility])   [place] [facility] [place], Cornelius Moras, RPH-CPP   1 year ago Encounter for medication review and counseling   [place] [facility] Drucilla Chalet, RPH-CPP   1 year ago Type 2 diabetes mellitus with other circulatory complication, without long-term current use of insulin (HCC)   [place] [facility] [place], Odette Horns, MD   1 year ago Type 2 diabetes mellitus with other circulatory complication, without long-term current use of insulin (HCC)   [place] [facility] [place], Odette Horns, MD                    Requested Prescriptions  Pending Prescriptions Disp Refills   methocarbamol (ROBAXIN) 500 MG tablet 60 tablet 0    Sig: Take 1 tablet (500 mg total) by mouth every 8 (eight) hours as needed for muscle spasms.      Not Delegated - Analgesics:  Muscle Relaxants Failed - 06/12/2021  2:25 PM      Failed - This  refill cannot be delegated      Failed - Valid encounter within last 6 months    Recent Outpatient Visits           6 months ago Type 2 diabetes mellitus with other circulatory complication, without long-term current use of insulin (HCC)   [place] [facility] [place], [place], MD   1 year ago Type 2 diabetes mellitus with other circulatory complication, without long-term current use of insulin [facility])   [place] [facility] Lois Hux[place], Cornelius Moras, RPH-CPP   1 year ago Encounter for medication review and counseling   [place] [facility] [place], Cornelius Moras, RPH-CPP   1 year ago Type 2 diabetes mellitus with other circulatory complication, without long-term current use of insulin (HCC)   [place] [facility] [place], Odette Horns, MD   1 year ago Type 2 diabetes mellitus with other circulatory complication, without long-term current use of insulin (HCC)   [facility] Hoy Register, MD

## 2021-06-24 IMAGING — DX DG CHEST 2V
2 series · 2 of 2 positions shown · non-contrast
Comparison: May 05, 2020

CLINICAL DATA: Shortness of breath.  Cough.

EXAM:
CHEST - 2 VIEW

[chest pa]
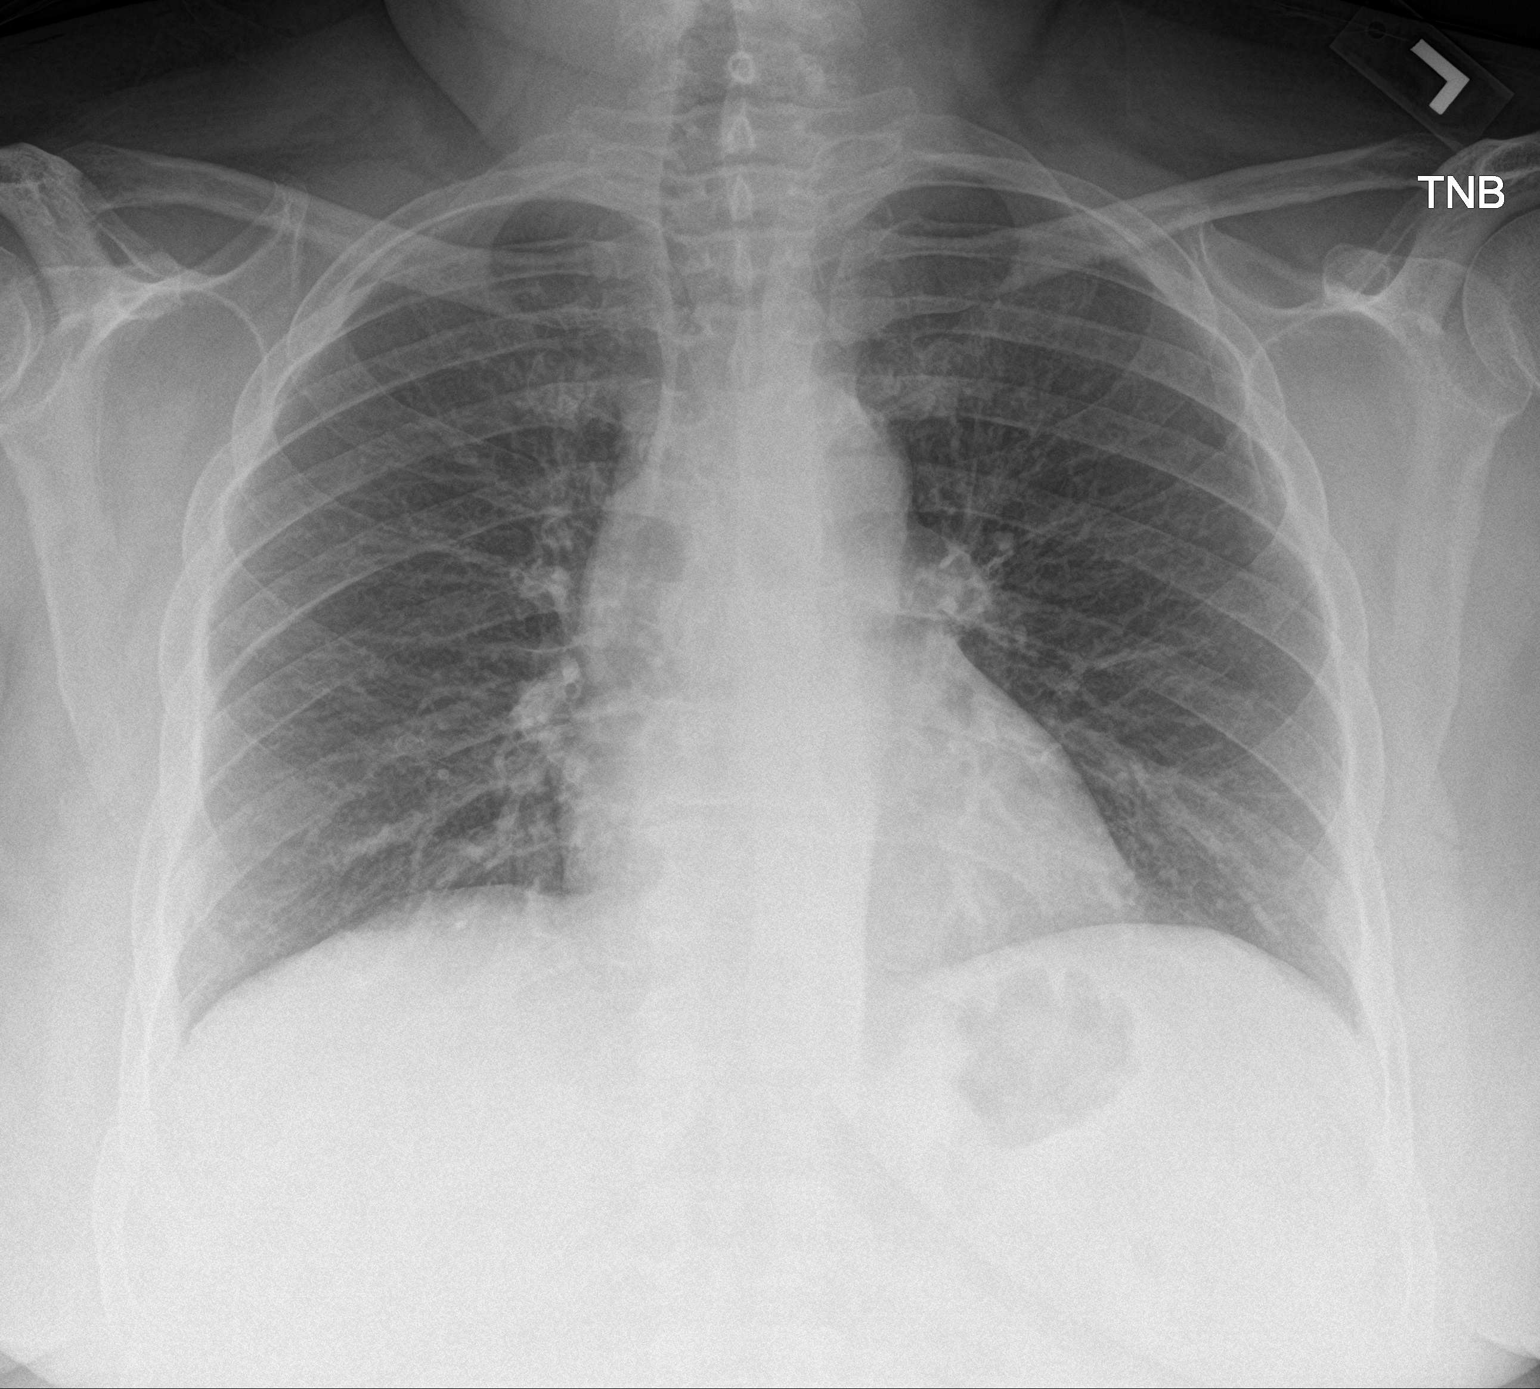

[chest lat]
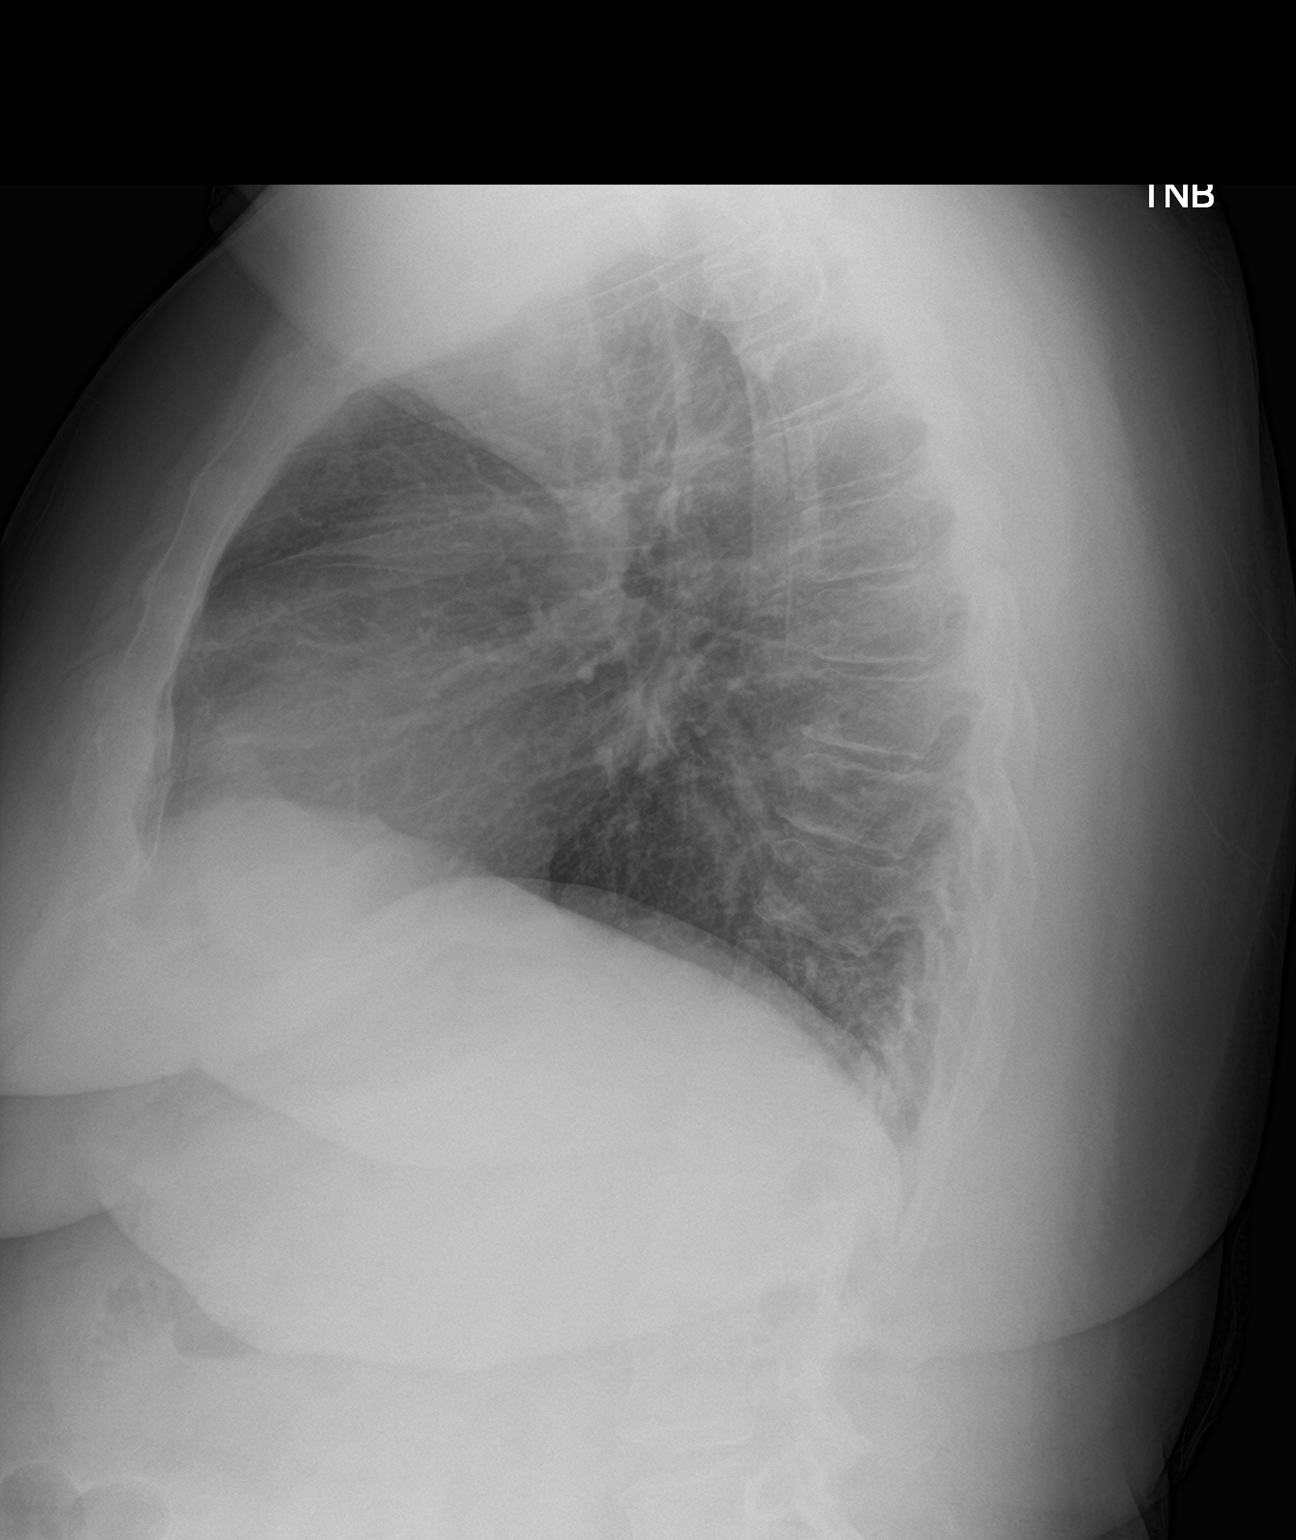

[2 of 2 positions shown; findings below may reference images not displayed]

FINDINGS: The heart size and mediastinal contours are within normal limits.
Both lungs are clear. The visualized skeletal structures are
unremarkable.
IMPRESSION: No active cardiopulmonary disease.

## 2021-09-17 ENCOUNTER — Other Ambulatory Visit: Payer: Self-pay

## 2021-09-17 ENCOUNTER — Encounter (HOSPITAL_COMMUNITY): Payer: Self-pay | Admitting: Emergency Medicine

## 2021-09-17 ENCOUNTER — Ambulatory Visit (HOSPITAL_COMMUNITY)
Admission: EM | Admit: 2021-09-17 | Discharge: 2021-09-17 | Disposition: A | Discharge status: Home / Self Care | Payer: Self-pay | Attending: Emergency Medicine | Admitting: Emergency Medicine

## 2021-09-17 ENCOUNTER — Ambulatory Visit (INDEPENDENT_AMBULATORY_CARE_PROVIDER_SITE_OTHER): Payer: Self-pay

## 2021-09-17 DIAGNOSIS — M25552 Pain in left hip: Secondary | ICD-10-CM

## 2021-09-17 DIAGNOSIS — M25531 Pain in right wrist: Secondary | ICD-10-CM

## 2021-09-17 DIAGNOSIS — W19XXXA Unspecified fall, initial encounter: Secondary | ICD-10-CM

## 2021-09-17 DIAGNOSIS — M25532 Pain in left wrist: Secondary | ICD-10-CM

## 2021-09-17 MED ORDER — METHOCARBAMOL 500 MG PO TABS: 1000.0000 mg | ORAL_TABLET | Freq: Four times a day (QID) | ORAL | 0 refills | Status: DC

## 2021-09-17 MED ORDER — METHOCARBAMOL 500 MG PO TABS
1000.0000 mg | ORAL_TABLET | Freq: Four times a day (QID) | ORAL | 0 refills | Status: DC
  Filled 2021-09-17: qty 20, 3d supply, fill #0

## 2021-09-17 NOTE — Discharge Instructions (Addendum)
Use tylenol as directed on the package for pain. Try soaking in a warm bath with Epsom salt in it to help with the pain and swelling.
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
## 2021-09-17 NOTE — ED Triage Notes (Signed)
Pt is present today with bilateral and hand and wrist pain form a fall that happened x3 days ago. Pt denies LOC or hitting.

## 2021-09-20 ENCOUNTER — Other Ambulatory Visit: Payer: Self-pay | Admitting: Family Medicine

## 2021-09-20 ENCOUNTER — Other Ambulatory Visit: Payer: Self-pay

## 2021-09-20 DIAGNOSIS — E1159 Type 2 diabetes mellitus with other circulatory complications: Secondary | ICD-10-CM

## 2021-10-28 ENCOUNTER — Encounter (HOSPITAL_COMMUNITY): Payer: Self-pay | Admitting: Emergency Medicine

## 2021-10-28 ENCOUNTER — Ambulatory Visit (HOSPITAL_COMMUNITY)
Admission: EM | Admit: 2021-10-28 | Discharge: 2021-10-28 | Disposition: A | Discharge status: Home / Self Care | Payer: Medicaid Other | Attending: Student | Admitting: Student

## 2021-10-28 ENCOUNTER — Other Ambulatory Visit: Payer: Self-pay

## 2021-10-28 DIAGNOSIS — J09X2 Influenza due to identified novel influenza A virus with other respiratory manifestations: Secondary | ICD-10-CM

## 2021-10-28 LAB — POC INFLUENZA A AND B ANTIGEN (URGENT CARE ONLY)
INFLUENZA A ANTIGEN, POC: NEGATIVE
INFLUENZA B ANTIGEN, POC: NEGATIVE

## 2021-10-28 MED ORDER — METHOCARBAMOL 500 MG PO TABS
1000.0000 mg | ORAL_TABLET | Freq: Four times a day (QID) | ORAL | 0 refills | Status: DC
  Filled 2021-10-28 – 2021-11-15 (×2): qty 20, 3d supply, fill #0

## 2021-10-28 MED ORDER — OSELTAMIVIR PHOSPHATE 75 MG PO CAPS
75.0000 mg | ORAL_CAPSULE | Freq: Two times a day (BID) | ORAL | 0 refills | Status: DC
  Filled 2021-10-28 – 2021-11-15 (×2): qty 10, 5d supply, fill #0

## 2021-10-28 MED ORDER — ALBUTEROL SULFATE HFA 108 (90 BASE) MCG/ACT IN AERS
1.0000 | INHALATION_SPRAY | Freq: Four times a day (QID) | RESPIRATORY_TRACT | 0 refills | Status: DC | PRN
  Filled 2021-10-28: qty 18, 25d supply, fill #0

## 2021-10-28 MED ORDER — ONDANSETRON 8 MG PO TBDP
8.0000 mg | ORAL_TABLET | Freq: Three times a day (TID) | ORAL | 0 refills | Status: DC | PRN
  Filled 2021-10-28: qty 18, 6d supply, fill #0
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
## 2021-10-28 NOTE — ED Triage Notes (Signed)
Pt reports since Friday having body aches, cough, sore throat, right ear. Pt reports that she gets her muscle relaxer from here.

## 2021-10-28 NOTE — Discharge Instructions (Addendum)
-  Tamiflu twice daily x5 days. This medication can cause nausea, so I also sent nausea medication. You can stop the Tamiflu if you don't like it or if it causes side effects.  °-Take the Zofran (ondansetron) up to 3 times daily for nausea and vomiting. °-Albuterol inhaler as needed for cough, wheezing, shortness of breath, 1 to 2 puffs every 6 hours as needed. °-For fevers/chills, bodyaches, headaches- You can take Tylenol up to 1000 mg 3 times daily, and ibuprofen up to 600 mg 3 times daily with food.  You can take these together, or alternate every 3-4 hours. °-Drink plenty of water/gatorade and get plenty of rest °-With a virus, you're typically contagious for 5-7 days, or as long as you're having fevers.  °-Come back and see us if things are getting worse instead of better, like shortness of breath, chest pain, fevers and chills that are getting higher instead of lower and do not come down with Tylenol or ibuprofen, etc. ° °

## 2021-10-29 ENCOUNTER — Other Ambulatory Visit: Payer: Self-pay

## 2021-11-01 ENCOUNTER — Other Ambulatory Visit: Payer: Self-pay

## 2021-11-15 ENCOUNTER — Other Ambulatory Visit: Payer: Self-pay

## 2021-11-29 ENCOUNTER — Other Ambulatory Visit: Payer: Self-pay

## 2021-12-10 ENCOUNTER — Other Ambulatory Visit (HOSPITAL_COMMUNITY): Payer: Self-pay

## 2021-12-17 ENCOUNTER — Ambulatory Visit (HOSPITAL_COMMUNITY)
Admission: EM | Admit: 2021-12-17 | Discharge: 2021-12-17 | Disposition: A | Discharge status: Home / Self Care | Payer: 59 | Attending: Family Medicine | Admitting: Family Medicine

## 2021-12-17 ENCOUNTER — Other Ambulatory Visit: Payer: Self-pay

## 2021-12-17 ENCOUNTER — Encounter (HOSPITAL_COMMUNITY): Payer: Self-pay

## 2021-12-17 ENCOUNTER — Ambulatory Visit (INDEPENDENT_AMBULATORY_CARE_PROVIDER_SITE_OTHER): Payer: 59

## 2021-12-17 DIAGNOSIS — R109 Unspecified abdominal pain: Secondary | ICD-10-CM | POA: Insufficient documentation

## 2021-12-17 DIAGNOSIS — R519 Headache, unspecified: Secondary | ICD-10-CM | POA: Insufficient documentation

## 2021-12-17 DIAGNOSIS — R079 Chest pain, unspecified: Secondary | ICD-10-CM

## 2021-12-17 DIAGNOSIS — E1140 Type 2 diabetes mellitus with diabetic neuropathy, unspecified: Secondary | ICD-10-CM | POA: Insufficient documentation

## 2021-12-17 DIAGNOSIS — Z20822 Contact with and (suspected) exposure to covid-19: Secondary | ICD-10-CM | POA: Diagnosis not present

## 2021-12-17 DIAGNOSIS — M549 Dorsalgia, unspecified: Secondary | ICD-10-CM

## 2021-12-17 DIAGNOSIS — R0981 Nasal congestion: Secondary | ICD-10-CM | POA: Diagnosis not present

## 2021-12-17 DIAGNOSIS — M5134 Other intervertebral disc degeneration, thoracic region: Secondary | ICD-10-CM | POA: Diagnosis not present

## 2021-12-17 NOTE — ED Triage Notes (Signed)
Pt presents with right side facial pain and left side flank pain since yesterday.
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
## 2021-12-17 NOTE — Discharge Instructions (Addendum)
Chest x-ray did not show any fluid or pneumonia in the lungs, but it did show arthritis in your upper back.  This may explain the burning pain in your left upper back you are having.  I think you are beginning to have an upper respiratory infection that is causing headache and the congestion. You have been swabbed for COVID, and the test will result in the next 24 hours. Our staff will call you if positive. If the test is positive, you should quarantine for 5 days.   Take Tylenol 500 mg 2 every 6 hours as needed for pain or fever

## 2021-12-18 LAB — SARS CORONAVIRUS 2 (TAT 6-24 HRS): SARS Coronavirus 2: NEGATIVE

## 2021-12-20 ENCOUNTER — Encounter (HOSPITAL_COMMUNITY): Payer: Self-pay

## 2021-12-20 ENCOUNTER — Ambulatory Visit (HOSPITAL_COMMUNITY)
Admission: EM | Admit: 2021-12-20 | Discharge: 2021-12-20 | Disposition: A | Discharge status: Home / Self Care | Payer: 59 | Attending: Family Medicine | Admitting: Family Medicine

## 2021-12-20 DIAGNOSIS — R059 Cough, unspecified: Secondary | ICD-10-CM | POA: Diagnosis not present

## 2021-12-20 DIAGNOSIS — J0120 Acute ethmoidal sinusitis, unspecified: Secondary | ICD-10-CM

## 2021-12-20 LAB — POCT RAPID STREP A, ED / UC: Streptococcus, Group A Screen (Direct): NEGATIVE

## 2021-12-20 MED ORDER — BENZONATATE 100 MG PO CAPS
100.0000 mg | ORAL_CAPSULE | Freq: Four times a day (QID) | ORAL | 0 refills | Status: DC | PRN
  Filled 2021-12-20: qty 30, 8d supply, fill #0

## 2021-12-20 MED ORDER — AMOXICILLIN 500 MG PO CAPS
500.0000 mg | ORAL_CAPSULE | Freq: Three times a day (TID) | ORAL | 0 refills | Status: DC
  Filled 2021-12-20: qty 30, 10d supply, fill #0

## 2021-12-21 ENCOUNTER — Other Ambulatory Visit: Payer: Self-pay

## 2021-12-23 ENCOUNTER — Other Ambulatory Visit: Payer: Self-pay

## 2021-12-23 LAB — CULTURE, GROUP A STREP (THRC)
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
## 2021-12-30 ENCOUNTER — Other Ambulatory Visit: Payer: Self-pay

## 2022-01-01 ENCOUNTER — Other Ambulatory Visit: Payer: Self-pay

## 2022-01-01 ENCOUNTER — Ambulatory Visit (HOSPITAL_COMMUNITY)
Admission: EM | Admit: 2022-01-01 | Discharge: 2022-01-01 | Disposition: A | Discharge status: Home / Self Care | Payer: Medicaid Other | Attending: Internal Medicine | Admitting: Internal Medicine

## 2022-01-01 ENCOUNTER — Encounter (HOSPITAL_COMMUNITY): Payer: Self-pay

## 2022-01-01 DIAGNOSIS — M5442 Lumbago with sciatica, left side: Secondary | ICD-10-CM

## 2022-01-01 DIAGNOSIS — R0789 Other chest pain: Secondary | ICD-10-CM

## 2022-01-01 DIAGNOSIS — G8929 Other chronic pain: Secondary | ICD-10-CM

## 2022-01-01 MED ORDER — METHOCARBAMOL 500 MG PO TABS
1000.0000 mg | ORAL_TABLET | Freq: Three times a day (TID) | ORAL | 0 refills | Status: DC
  Filled 2022-01-01: qty 60, 10d supply, fill #0

## 2022-01-01 NOTE — Discharge Instructions (Addendum)
As discussed your chest pain is concerning given your multiple risk factors and previous history of MI.  As discussed the EKG today showed no acute findings however I cannot rule out a non-stemi myocardial infarction.  I encourage you to go to the emergency room if your pain persists for further evaluation.  I renewed your prescription for Robaxin please follow-up with your primary care provider regarding further prescription refills ?
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
## 2022-01-01 NOTE — ED Triage Notes (Signed)
Pt c/o lt sided chest pain since 3am. States took a nitro with relief. Pt c/o chronic lt hip pain today. State she received bad new at 3am this morning is what brought everything on. 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  ?

## 2022-01-03 ENCOUNTER — Ambulatory Visit (HOSPITAL_COMMUNITY)
Admission: EM | Admit: 2022-01-03 | Discharge: 2022-01-03 | Disposition: A | Discharge status: Home / Self Care | Payer: Medicaid Other | Attending: Internal Medicine | Admitting: Internal Medicine

## 2022-01-03 ENCOUNTER — Other Ambulatory Visit: Payer: Self-pay

## 2022-01-03 ENCOUNTER — Telehealth (HOSPITAL_COMMUNITY): Payer: Self-pay

## 2022-01-03 ENCOUNTER — Encounter (HOSPITAL_COMMUNITY): Payer: Self-pay

## 2022-01-03 DIAGNOSIS — R200 Anesthesia of skin: Secondary | ICD-10-CM

## 2022-01-03 DIAGNOSIS — M79601 Pain in right arm: Secondary | ICD-10-CM

## 2022-01-03 MED ORDER — PREDNISONE 20 MG PO TABS: 20.0000 mg | ORAL_TABLET | Freq: Every day | ORAL | 0 refills | Status: AC

## 2022-01-03 MED ORDER — PREDNISONE 20 MG PO TABS
20.0000 mg | ORAL_TABLET | Freq: Every day | ORAL | 0 refills | Status: DC
  Filled 2022-01-03: qty 5, 5d supply, fill #0
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
## 2022-01-03 NOTE — Discharge Instructions (Addendum)
We will try a short course of steroids to help with right arm pain ?Gentle range of motion exercises ?Heating pad use only 20 minutes on-20 minutes off cycle ?Continue taking Tylenol as needed for pain ?Return to urgent care if symptoms worsen. ?

## 2022-01-03 NOTE — ED Triage Notes (Signed)
Pt presents with right shoulder pain that developed today, pt states she has been having issues with pain for weeks  ?

## 2022-01-03 NOTE — Telephone Encounter (Signed)
Pharmacy changed for new medication prescribed. ?

## 2022-01-06 ENCOUNTER — Other Ambulatory Visit: Payer: Self-pay

## 2022-02-15 ENCOUNTER — Other Ambulatory Visit: Payer: Self-pay

## 2022-02-15 ENCOUNTER — Ambulatory Visit (HOSPITAL_COMMUNITY)
Admission: EM | Admit: 2022-02-15 | Discharge: 2022-02-15 | Disposition: A | Discharge status: Home / Self Care | Payer: Self-pay | Attending: Emergency Medicine | Admitting: Emergency Medicine

## 2022-02-15 ENCOUNTER — Encounter (HOSPITAL_COMMUNITY): Payer: Self-pay | Admitting: Emergency Medicine

## 2022-02-15 DIAGNOSIS — M542 Cervicalgia: Secondary | ICD-10-CM

## 2022-02-15 MED ORDER — METHYLPREDNISOLONE SODIUM SUCC 125 MG IJ SOLR
INTRAMUSCULAR | Status: AC
  Filled 2022-02-15: qty 2

## 2022-02-15 MED ORDER — METHYLPREDNISOLONE SODIUM SUCC 125 MG IJ SOLR
60.0000 mg | Freq: Once | INTRAMUSCULAR | Status: AC
  Administered 2022-02-15: 60 mg via INTRAMUSCULAR

## 2022-02-15 MED ORDER — CYCLOBENZAPRINE HCL 5 MG PO TABS: 5.0000 mg | ORAL_TABLET | Freq: Three times a day (TID) | ORAL | 0 refills | Status: DC | PRN
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
## 2022-02-15 NOTE — ED Triage Notes (Signed)
Pt reports yesterday due to left shoulder and neck pains that radiates to right arm. Pt has swelling in right hand.  ?

## 2022-02-15 NOTE — Discharge Instructions (Signed)
Your pain is most likely caused by irritation to the muscles or ligaments.  ? ?Given an injection of a steroid here in the office today to help reduce inflammation and help with your pain, we will avoid use of oral steroids as your last diabetes numbers were elevated and you have not recently been monitored ? ?You may use muscle relaxer 3 times a day as needed for comfort ? ?You may use heating pad in 15 minute intervals as needed for additional comfort, within the first 2-3 days you may find comfort in using ice in 10-15 minutes over affected area ? ?Begin stretching affected area daily for 10 minutes as tolerated to further loosen muscles  ? ?When lying down place pillow underneath and between knees for support ? ?Can try sleeping without pillow on firm mattress  ? ?Practice good posture: head back, shoulders back, chest forward, pelvis back and weight distributed evenly on both legs ? ?If pain persist after recommended treatment or reoccurs if may be beneficial to follow up with orthopedic specialist for evaluation, this doctor specializes in the bones and can manage your symptoms long-term with options such as but not limited to imaging, medications or physical therapy  ?  ?

## 2022-02-28 ENCOUNTER — Other Ambulatory Visit: Payer: Self-pay

## 2022-02-28 ENCOUNTER — Telehealth (HOSPITAL_COMMUNITY): Payer: Self-pay | Admitting: *Deleted

## 2022-02-28 NOTE — Telephone Encounter (Signed)
TC to Pt . Pt had questions about DC meds from last week. Pt informed meds were e faxed to [facility] on [address]. Pt reported she would pick med up. ?

## 2022-03-10 ENCOUNTER — Other Ambulatory Visit: Payer: Self-pay

## 2022-03-10 MED ORDER — LISINOPRIL-HYDROCHLOROTHIAZIDE 20-25 MG PO TABS: 1.0000 | ORAL_TABLET | Freq: Every day | ORAL | 1 refills | Status: DC

## 2022-03-10 MED ORDER — CLOPIDOGREL BISULFATE 75 MG PO TABS: 75.0000 mg | ORAL_TABLET | Freq: Every day | ORAL | 1 refills | Status: DC

## 2022-03-12 ENCOUNTER — Ambulatory Visit (HOSPITAL_COMMUNITY)
Admission: EM | Admit: 2022-03-12 | Discharge: 2022-03-12 | Disposition: A | Discharge status: Home / Self Care | Payer: Self-pay | Attending: Family Medicine | Admitting: Family Medicine

## 2022-03-12 ENCOUNTER — Encounter (HOSPITAL_COMMUNITY): Payer: Self-pay | Admitting: *Deleted

## 2022-03-12 DIAGNOSIS — Z760 Encounter for issue of repeat prescription: Secondary | ICD-10-CM

## 2022-03-12 DIAGNOSIS — Z86718 Personal history of other venous thrombosis and embolism: Secondary | ICD-10-CM

## 2022-03-12 DIAGNOSIS — I749 Embolism and thrombosis of unspecified artery: Secondary | ICD-10-CM

## 2022-03-12 HISTORY — DX: Embolism and thrombosis of unspecified artery: I74.9

## 2022-03-12 MED ORDER — CLOPIDOGREL BISULFATE 75 MG PO TABS: 75.0000 mg | ORAL_TABLET | Freq: Every day | ORAL | 0 refills | Status: DC

## 2022-03-12 NOTE — ED Triage Notes (Signed)
Pt reports running out of clopidigrel 3 days ago; states cannot afford to pick up her Rx due to cost. States today at work started having some left thoracic back pain and left lower leg tightness. Denies any parasthesias. ?

## 2022-03-12 NOTE — ED Notes (Signed)
Assisted pt in setting up free GoodRx acct for coupon for supply of Plavix for $0.50 through different pharmacy -- coupon printed and provided to pt. ?
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
## 2022-03-23 ENCOUNTER — Ambulatory Visit (HOSPITAL_COMMUNITY)
Admission: EM | Admit: 2022-03-23 | Discharge: 2022-03-23 | Disposition: A | Discharge status: Home / Self Care | Payer: Self-pay | Attending: Family Medicine | Admitting: Family Medicine

## 2022-03-23 ENCOUNTER — Ambulatory Visit (INDEPENDENT_AMBULATORY_CARE_PROVIDER_SITE_OTHER): Payer: Self-pay

## 2022-03-23 ENCOUNTER — Other Ambulatory Visit: Payer: Self-pay

## 2022-03-23 ENCOUNTER — Encounter (HOSPITAL_COMMUNITY): Payer: Self-pay | Admitting: *Deleted

## 2022-03-23 DIAGNOSIS — R0789 Other chest pain: Secondary | ICD-10-CM

## 2022-03-23 DIAGNOSIS — R079 Chest pain, unspecified: Secondary | ICD-10-CM

## 2022-03-23 DIAGNOSIS — I519 Heart disease, unspecified: Secondary | ICD-10-CM

## 2022-03-23 DIAGNOSIS — Z86718 Personal history of other venous thrombosis and embolism: Secondary | ICD-10-CM

## 2022-03-23 DIAGNOSIS — D869 Sarcoidosis, unspecified: Secondary | ICD-10-CM

## 2022-03-23 MED ORDER — FAMOTIDINE 20 MG PO TABS: 20.0000 mg | ORAL_TABLET | Freq: Two times a day (BID) | ORAL | 0 refills | Status: DC

## 2022-03-23 NOTE — ED Triage Notes (Signed)
Pt reports lungs are burning and has CP.
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
## 2022-03-28 ENCOUNTER — Telehealth: Payer: Self-pay | Admitting: Cardiology

## 2022-03-28 NOTE — Telephone Encounter (Signed)
Pt c/o of Chest Pain: STAT if CP now or developed within 24 hours  1. Are you having CP right now? No   2. Are you experiencing any other symptoms (ex. SOB, nausea, vomiting, sweating)? No   3. How long have you been experiencing CP? 2 to 3 months  4. Is your CP continuous or coming and going? Coming and going  5. Have you taken Nitroglycerin? No   Please call number [phone] ?

## 2022-03-28 NOTE — Telephone Encounter (Signed)
Spoke with pt who reports recurrent CP, worse at night when she lies down.  She has history of MI in 2020 per pt.  She has had multiple recent visits to urgent care with the last one being 03/23/2022.  Provider felt CP is unlikely cardiac related but encouraged follow up with PCP and cardiology.  Pt last seen by Dr Gardiner Rhyme 01/28/2021.  She states she is taking medications as prescribed.  She also states her work is requesting she follow up with cardiology due to her frequent complaints of chest pain. 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  Reviewed ED precautions.  Pt verbalizes understanding and agrees with current plan.

## 2022-03-29 ENCOUNTER — Inpatient Hospital Stay (HOSPITAL_COMMUNITY)
Admission: EM | Admit: 2022-03-29 | Discharge: 2022-04-08 | DRG: 246 | Disposition: A | Discharge status: Rehabilitation Facility | Payer: No Typology Code available for payment source | Attending: Internal Medicine | Admitting: Internal Medicine

## 2022-03-29 ENCOUNTER — Encounter (HOSPITAL_COMMUNITY)
Admission: EM | Disposition: A | Discharge status: Rehabilitation Facility | Payer: Self-pay | Source: Home / Self Care | Attending: Internal Medicine

## 2022-03-29 ENCOUNTER — Encounter (HOSPITAL_COMMUNITY): Payer: Self-pay | Admitting: Emergency Medicine

## 2022-03-29 ENCOUNTER — Emergency Department (HOSPITAL_COMMUNITY): Payer: No Typology Code available for payment source

## 2022-03-29 ENCOUNTER — Other Ambulatory Visit: Payer: Self-pay

## 2022-03-29 ENCOUNTER — Other Ambulatory Visit (HOSPITAL_COMMUNITY): Payer: Medicaid Other

## 2022-03-29 DIAGNOSIS — I214 Non-ST elevation (NSTEMI) myocardial infarction: Secondary | ICD-10-CM | POA: Diagnosis not present

## 2022-03-29 DIAGNOSIS — E1169 Type 2 diabetes mellitus with other specified complication: Secondary | ICD-10-CM | POA: Diagnosis present

## 2022-03-29 DIAGNOSIS — Z86711 Personal history of pulmonary embolism: Secondary | ICD-10-CM

## 2022-03-29 DIAGNOSIS — Z7984 Long term (current) use of oral hypoglycemic drugs: Secondary | ICD-10-CM

## 2022-03-29 DIAGNOSIS — E785 Hyperlipidemia, unspecified: Secondary | ICD-10-CM

## 2022-03-29 DIAGNOSIS — K920 Hematemesis: Secondary | ICD-10-CM | POA: Clinically undetermined

## 2022-03-29 DIAGNOSIS — K219 Gastro-esophageal reflux disease without esophagitis: Secondary | ICD-10-CM | POA: Diagnosis present

## 2022-03-29 DIAGNOSIS — E1165 Type 2 diabetes mellitus with hyperglycemia: Secondary | ICD-10-CM | POA: Diagnosis present

## 2022-03-29 DIAGNOSIS — I10 Essential (primary) hypertension: Secondary | ICD-10-CM | POA: Diagnosis present

## 2022-03-29 DIAGNOSIS — Z91018 Allergy to other foods: Secondary | ICD-10-CM

## 2022-03-29 DIAGNOSIS — Z833 Family history of diabetes mellitus: Secondary | ICD-10-CM

## 2022-03-29 DIAGNOSIS — Z888 Allergy status to other drugs, medicaments and biological substances status: Secondary | ICD-10-CM

## 2022-03-29 DIAGNOSIS — E010 Iodine-deficiency related diffuse (endemic) goiter: Secondary | ICD-10-CM | POA: Diagnosis present

## 2022-03-29 DIAGNOSIS — R29706 NIHSS score 6: Secondary | ICD-10-CM | POA: Diagnosis not present

## 2022-03-29 DIAGNOSIS — Z91148 Patient's other noncompliance with medication regimen for other reason: Secondary | ICD-10-CM

## 2022-03-29 DIAGNOSIS — Z886 Allergy status to analgesic agent status: Secondary | ICD-10-CM

## 2022-03-29 DIAGNOSIS — R079 Chest pain, unspecified: Secondary | ICD-10-CM | POA: Diagnosis present

## 2022-03-29 DIAGNOSIS — D869 Sarcoidosis, unspecified: Secondary | ICD-10-CM | POA: Diagnosis present

## 2022-03-29 DIAGNOSIS — I6340 Cerebral infarction due to embolism of unspecified cerebral artery: Secondary | ICD-10-CM

## 2022-03-29 DIAGNOSIS — I69354 Hemiplegia and hemiparesis following cerebral infarction affecting left non-dominant side: Secondary | ICD-10-CM

## 2022-03-29 DIAGNOSIS — E669 Obesity, unspecified: Secondary | ICD-10-CM | POA: Diagnosis present

## 2022-03-29 DIAGNOSIS — Z8249 Family history of ischemic heart disease and other diseases of the circulatory system: Secondary | ICD-10-CM

## 2022-03-29 DIAGNOSIS — Z6833 Body mass index (BMI) 33.0-33.9, adult: Secondary | ICD-10-CM

## 2022-03-29 DIAGNOSIS — Z955 Presence of coronary angioplasty implant and graft: Secondary | ICD-10-CM

## 2022-03-29 DIAGNOSIS — I2510 Atherosclerotic heart disease of native coronary artery without angina pectoris: Secondary | ICD-10-CM

## 2022-03-29 DIAGNOSIS — Q446 Cystic disease of liver: Secondary | ICD-10-CM

## 2022-03-29 DIAGNOSIS — Z885 Allergy status to narcotic agent status: Secondary | ICD-10-CM

## 2022-03-29 DIAGNOSIS — Z79899 Other long term (current) drug therapy: Secondary | ICD-10-CM

## 2022-03-29 DIAGNOSIS — Z9851 Tubal ligation status: Secondary | ICD-10-CM

## 2022-03-29 DIAGNOSIS — I6381 Other cerebral infarction due to occlusion or stenosis of small artery: Secondary | ICD-10-CM | POA: Diagnosis not present

## 2022-03-29 DIAGNOSIS — R600 Localized edema: Secondary | ICD-10-CM | POA: Diagnosis not present

## 2022-03-29 DIAGNOSIS — R200 Anesthesia of skin: Secondary | ICD-10-CM

## 2022-03-29 DIAGNOSIS — I639 Cerebral infarction, unspecified: Secondary | ICD-10-CM | POA: Diagnosis present

## 2022-03-29 DIAGNOSIS — Z7902 Long term (current) use of antithrombotics/antiplatelets: Secondary | ICD-10-CM

## 2022-03-29 HISTORY — PX: LEFT HEART CATH AND CORONARY ANGIOGRAPHY: CATH118249

## 2022-03-29 HISTORY — DX: Non-ST elevation (NSTEMI) myocardial infarction: I21.4

## 2022-03-29 HISTORY — DX: Atherosclerotic heart disease of native coronary artery without angina pectoris: I25.10

## 2022-03-29 HISTORY — PX: CORONARY/GRAFT ACUTE MI REVASCULARIZATION: CATH118305

## 2022-03-29 LAB — TROPONIN I (HIGH SENSITIVITY)
Troponin I (High Sensitivity): 15 ng/L (ref <18)
Troponin I (High Sensitivity): 44 ng/L — ABNORMAL HIGH (ref <18)
Troponin I (High Sensitivity): 87 ng/L — ABNORMAL HIGH (ref <18)
Troponin I (High Sensitivity): 134 ng/L (ref <18)
Troponin I (High Sensitivity): 302 ng/L (ref <18)

## 2022-03-29 LAB — CBC WITH DIFFERENTIAL/PLATELET
Abs Immature Granulocytes: 0.02 10*3/uL (ref 0.00–0.07)
Basophils Absolute: 0.1 10*3/uL (ref 0.0–0.1)
Basophils Relative: 1 %
Eosinophils Absolute: 0.1 10*3/uL (ref 0.0–0.5)
Eosinophils Relative: 2 %
HCT: 37.7 % (ref 36.0–46.0)
Hemoglobin: 12.8 g/dL (ref 12.0–15.0)
Immature Granulocytes: 0 %
Lymphocytes Relative: 38 %
Lymphs Abs: 2.3 10*3/uL (ref 0.7–4.0)
MCH: 32.1 pg (ref 26.0–34.0)
MCHC: 34 g/dL (ref 30.0–36.0)
MCV: 94.5 fL (ref 80.0–100.0)
Monocytes Absolute: 0.3 10*3/uL (ref 0.1–1.0)
Monocytes Relative: 5 %
Neutro Abs: 3.4 10*3/uL (ref 1.7–7.7)
Neutrophils Relative %: 54 %
Platelets: 212 10*3/uL (ref 150–400)
RBC: 3.99 MIL/uL (ref 3.87–5.11)
RDW: 11.9 % (ref 11.5–15.5)
WBC: 6.2 10*3/uL (ref 4.0–10.5)
nRBC: 0 % (ref 0.0–0.2)

## 2022-03-29 LAB — HIV ANTIBODY (ROUTINE TESTING W REFLEX): HIV Screen 4th Generation wRfx: NONREACTIVE

## 2022-03-29 LAB — BASIC METABOLIC PANEL
Anion gap: 10 (ref 5–15)
BUN: 16 mg/dL (ref 6–20)
CO2: 23 mmol/L (ref 22–32)
Calcium: 9.2 mg/dL (ref 8.9–10.3)
Chloride: 104 mmol/L (ref 98–111)
Creatinine, Ser: 0.91 mg/dL (ref 0.44–1.00)
GFR, Estimated: >60 mL/min (ref >60)
Glucose, Bld: 394 mg/dL — ABNORMAL HIGH (ref 70–99)
Potassium: 3.8 mmol/L (ref 3.5–5.1)
Sodium: 137 mmol/L (ref 135–145)

## 2022-03-29 LAB — HEMOGLOBIN AND HEMATOCRIT, BLOOD
HCT: 38.5 % (ref 36.0–46.0)
Hemoglobin: 13.2 g/dL (ref 12.0–15.0)

## 2022-03-29 LAB — LIPID PANEL
Cholesterol: 269 mg/dL — ABNORMAL HIGH (ref 0–200)
HDL: 48 mg/dL (ref >40)
LDL Cholesterol: 194 mg/dL — ABNORMAL HIGH (ref 0–99)
Total CHOL/HDL Ratio: 5.6 RATIO
Triglycerides: 136 mg/dL (ref <150)
VLDL: 27 mg/dL (ref 0–40)

## 2022-03-29 LAB — MRSA NEXT GEN BY PCR, NASAL: MRSA by PCR Next Gen: DETECTED — AB

## 2022-03-29 LAB — HEMOGLOBIN A1C
Hgb A1c MFr Bld: 12.3 % — ABNORMAL HIGH (ref 4.8–5.6)
Mean Plasma Glucose: 306.31 mg/dL

## 2022-03-29 LAB — CBG MONITORING, ED: Glucose-Capillary: 396 mg/dL — ABNORMAL HIGH (ref 70–99)

## 2022-03-29 LAB — D-DIMER, QUANTITATIVE: D-Dimer, Quant: 0.29 ug/mL-FEU (ref 0.00–0.50)

## 2022-03-29 LAB — GLUCOSE, CAPILLARY
Glucose-Capillary: 231 mg/dL — ABNORMAL HIGH (ref 70–99)
Glucose-Capillary: 295 mg/dL — ABNORMAL HIGH (ref 70–99)

## 2022-03-29 LAB — ABO/RH: ABO/RH(D): AB POS

## 2022-03-29 LAB — OCCULT BLOOD X 1 CARD TO LAB, STOOL: Fecal Occult Bld: NEGATIVE

## 2022-03-29 SURGERY — CORONARY/GRAFT ACUTE MI REVASCULARIZATION
Anesthesia: LOCAL

## 2022-03-29 MED ORDER — SODIUM CHLORIDE 0.9% FLUSH: 3.0000 mL | Freq: Two times a day (BID) | INTRAVENOUS | Status: DC

## 2022-03-29 MED ORDER — SODIUM CHLORIDE 0.9% FLUSH
3.0000 mL | Freq: Two times a day (BID) | INTRAVENOUS | Status: DC
  Administered 2022-03-29 – 2022-04-08 (×17): 3 mL via INTRAVENOUS

## 2022-03-29 MED ORDER — SODIUM CHLORIDE 0.9 % IV SOLN: INTRAVENOUS | Status: DC

## 2022-03-29 MED ORDER — FENTANYL CITRATE (PF) 100 MCG/2ML IJ SOLN
INTRAMUSCULAR | Status: DC | PRN
  Administered 2022-03-29 (×2): 25 ug via INTRAVENOUS

## 2022-03-29 MED ORDER — CLOPIDOGREL BISULFATE 75 MG PO TABS: 75.0000 mg | ORAL_TABLET | Freq: Every day | ORAL | Status: DC

## 2022-03-29 MED ORDER — HEPARIN (PORCINE) 25000 UT/250ML-% IV SOLN
1100.0000 [IU]/h | INTRAVENOUS | Status: DC
  Administered 2022-03-29: 1100 [IU]/h via INTRAVENOUS
  Filled 2022-03-29: qty 250

## 2022-03-29 MED ORDER — LISINOPRIL 20 MG PO TABS: 20.0000 mg | ORAL_TABLET | Freq: Every day | ORAL | Status: DC

## 2022-03-29 MED ORDER — HEPARIN SODIUM (PORCINE) 1000 UNIT/ML IJ SOLN
INTRAMUSCULAR | Status: AC
  Filled 2022-03-29: qty 10

## 2022-03-29 MED ORDER — CARVEDILOL 12.5 MG PO TABS
25.0000 mg | ORAL_TABLET | Freq: Two times a day (BID) | ORAL | Status: DC
  Administered 2022-03-29 – 2022-04-08 (×20): 25 mg via ORAL
  Filled 2022-03-29 (×7): qty 2
  Filled 2022-03-29 (×2): qty 1
  Filled 2022-03-29 (×2): qty 2
  Filled 2022-03-29: qty 1
  Filled 2022-03-29 (×8): qty 2

## 2022-03-29 MED ORDER — HEPARIN SODIUM (PORCINE) 1000 UNIT/ML IJ SOLN
INTRAMUSCULAR | Status: DC | PRN
  Administered 2022-03-29 (×2): 5000 [IU] via INTRAVENOUS

## 2022-03-29 MED ORDER — VERAPAMIL HCL 2.5 MG/ML IV SOLN
INTRAVENOUS | Status: AC
  Filled 2022-03-29: qty 2

## 2022-03-29 MED ORDER — SODIUM CHLORIDE 0.9 % IV SOLN
250.0000 mL | INTRAVENOUS | Status: DC | PRN
  Administered 2022-03-30: 250 mL via INTRAVENOUS

## 2022-03-29 MED ORDER — CLOPIDOGREL BISULFATE 75 MG PO TABS
75.0000 mg | ORAL_TABLET | Freq: Every day | ORAL | Status: DC
  Administered 2022-03-29 – 2022-03-30 (×2): 75 mg via ORAL
  Filled 2022-03-29 (×2): qty 1

## 2022-03-29 MED ORDER — NITROGLYCERIN IN D5W 200-5 MCG/ML-% IV SOLN
0.0000 ug/min | INTRAVENOUS | Status: DC
  Administered 2022-03-29: 20 ug/min via INTRAVENOUS
  Administered 2022-03-29: 10 ug/min via INTRAVENOUS
  Administered 2022-03-29: 40 ug/min via INTRAVENOUS
  Administered 2022-03-29: 5 ug/min via INTRAVENOUS
  Administered 2022-03-29: 75 ug/min via INTRAVENOUS
  Administered 2022-03-29: 30 ug/min via INTRAVENOUS
  Administered 2022-03-29: 130 ug/min via INTRAVENOUS
  Administered 2022-03-29: 40 ug/min via INTRAVENOUS
  Administered 2022-03-29: 70 ug/min via INTRAVENOUS
  Administered 2022-03-29: 95 ug/min via INTRAVENOUS
  Administered 2022-03-29: 105 ug/min via INTRAVENOUS
  Administered 2022-03-29: 35 ug/min via INTRAVENOUS
  Administered 2022-03-29: 140 ug/min via INTRAVENOUS
  Administered 2022-03-29: 65 ug/min via INTRAVENOUS
  Administered 2022-03-29: 60 ug/min via INTRAVENOUS
  Administered 2022-03-29: 100 ug/min via INTRAVENOUS
  Administered 2022-03-29: 40 ug/min via INTRAVENOUS
  Administered 2022-03-29: 85 ug/min via INTRAVENOUS
  Administered 2022-03-29: 110 ug/min via INTRAVENOUS
  Administered 2022-03-29: 90 ug/min via INTRAVENOUS
  Administered 2022-03-29: 25 ug/min via INTRAVENOUS
  Administered 2022-03-29: 135 ug/min via INTRAVENOUS
  Administered 2022-03-29: 115 ug/min via INTRAVENOUS
  Administered 2022-03-29: 55 ug/min via INTRAVENOUS
  Administered 2022-03-29: 50 ug/min via INTRAVENOUS
  Administered 2022-03-29: 110 ug/min via INTRAVENOUS
  Administered 2022-03-29: 125 ug/min via INTRAVENOUS
  Administered 2022-03-29: 65 ug/min via INTRAVENOUS
  Administered 2022-03-29: 80 ug/min via INTRAVENOUS
  Administered 2022-03-29: 120 ug/min via INTRAVENOUS
  Administered 2022-03-29: 15 ug/min via INTRAVENOUS
  Filled 2022-03-29: qty 250

## 2022-03-29 MED ORDER — ACETAMINOPHEN 325 MG PO TABS
650.0000 mg | ORAL_TABLET | ORAL | Status: DC | PRN
  Administered 2022-03-29: 650 mg via ORAL
  Filled 2022-03-29: qty 2

## 2022-03-29 MED ORDER — PANTOPRAZOLE SODIUM 40 MG IV SOLR
40.0000 mg | Freq: Two times a day (BID) | INTRAVENOUS | Status: DC
  Administered 2022-03-29 – 2022-03-30 (×3): 40 mg via INTRAVENOUS
  Filled 2022-03-29 (×3): qty 10

## 2022-03-29 MED ORDER — HEPARIN (PORCINE) IN NACL 1000-0.9 UT/500ML-% IV SOLN
INTRAVENOUS | Status: DC | PRN
  Administered 2022-03-29 (×2): 500 mL

## 2022-03-29 MED ORDER — VERAPAMIL HCL 2.5 MG/ML IV SOLN
INTRAVENOUS | Status: DC | PRN
  Administered 2022-03-29: 10 mL via INTRA_ARTERIAL

## 2022-03-29 MED ORDER — ONDANSETRON HCL 4 MG/2ML IJ SOLN: 4.0000 mg | Freq: Four times a day (QID) | INTRAMUSCULAR | Status: DC | PRN

## 2022-03-29 MED ORDER — SODIUM CHLORIDE 0.9 % IV SOLN: INTRAVENOUS | Status: AC

## 2022-03-29 MED ORDER — NITROGLYCERIN 0.4 MG SL SUBL
0.4000 mg | SUBLINGUAL_TABLET | SUBLINGUAL | Status: DC | PRN
  Administered 2022-03-29 – 2022-04-03 (×10): 0.4 mg via SUBLINGUAL
  Filled 2022-03-29 (×8): qty 1

## 2022-03-29 MED ORDER — ALBUTEROL SULFATE (2.5 MG/3ML) 0.083% IN NEBU: 2.5000 mg | INHALATION_SOLUTION | Freq: Four times a day (QID) | RESPIRATORY_TRACT | Status: DC | PRN

## 2022-03-29 MED ORDER — ACETAMINOPHEN 325 MG PO TABS: 650.0000 mg | ORAL_TABLET | Freq: Four times a day (QID) | ORAL | Status: DC | PRN

## 2022-03-29 MED ORDER — SODIUM CHLORIDE 0.9 % IV SOLN: 250.0000 mL | INTRAVENOUS | Status: DC | PRN

## 2022-03-29 MED ORDER — MORPHINE SULFATE (PF) 2 MG/ML IV SOLN
2.0000 mg | INTRAVENOUS | Status: AC | PRN
  Administered 2022-03-29 – 2022-03-31 (×3): 2 mg via INTRAVENOUS
  Filled 2022-03-29 (×3): qty 1

## 2022-03-29 MED ORDER — FENTANYL CITRATE (PF) 100 MCG/2ML IJ SOLN
INTRAMUSCULAR | Status: AC
Start: 2022-03-29 — End: ?
  Filled 2022-03-29: qty 2

## 2022-03-29 MED ORDER — LABETALOL HCL 5 MG/ML IV SOLN: 10.0000 mg | INTRAVENOUS | Status: AC | PRN

## 2022-03-29 MED ORDER — ONDANSETRON HCL 4 MG PO TABS: 4.0000 mg | ORAL_TABLET | Freq: Four times a day (QID) | ORAL | Status: DC | PRN

## 2022-03-29 MED ORDER — DIPHENHYDRAMINE HCL 50 MG/ML IJ SOLN
12.5000 mg | Freq: Four times a day (QID) | INTRAMUSCULAR | Status: DC | PRN
  Administered 2022-03-29 – 2022-03-31 (×3): 12.5 mg via INTRAVENOUS
  Filled 2022-03-29 (×3): qty 1

## 2022-03-29 MED ORDER — SODIUM CHLORIDE 0.9% FLUSH: 3.0000 mL | INTRAVENOUS | Status: DC | PRN

## 2022-03-29 MED ORDER — HYDROCHLOROTHIAZIDE 25 MG PO TABS: 25.0000 mg | ORAL_TABLET | Freq: Every day | ORAL | Status: DC

## 2022-03-29 MED ORDER — INSULIN GLARGINE-YFGN 100 UNIT/ML ~~LOC~~ SOLN
5.0000 [IU] | Freq: Every day | SUBCUTANEOUS | Status: DC
  Administered 2022-03-29 – 2022-03-30 (×2): 5 [IU] via SUBCUTANEOUS
  Filled 2022-03-29 (×2): qty 0.05

## 2022-03-29 MED ORDER — MIDAZOLAM HCL 2 MG/2ML IJ SOLN
INTRAMUSCULAR | Status: DC | PRN
  Administered 2022-03-29 (×2): 1 mg via INTRAVENOUS

## 2022-03-29 MED ORDER — HYDRALAZINE HCL 20 MG/ML IJ SOLN: 10.0000 mg | INTRAMUSCULAR | Status: AC | PRN

## 2022-03-29 MED ORDER — ALUM & MAG HYDROXIDE-SIMETH 200-200-20 MG/5ML PO SUSP
30.0000 mL | Freq: Once | ORAL | Status: AC
  Administered 2022-03-29: 30 mL via ORAL
  Filled 2022-03-29: qty 30

## 2022-03-29 MED ORDER — PANTOPRAZOLE SODIUM 40 MG IV SOLR
40.0000 mg | Freq: Once | INTRAVENOUS | Status: AC
  Administered 2022-03-29: 40 mg via INTRAVENOUS
  Filled 2022-03-29: qty 10

## 2022-03-29 MED ORDER — INSULIN ASPART 100 UNIT/ML IJ SOLN
0.0000 [IU] | Freq: Four times a day (QID) | INTRAMUSCULAR | Status: DC
  Administered 2022-03-29: 8 [IU] via SUBCUTANEOUS
  Administered 2022-03-29: 15 [IU] via SUBCUTANEOUS
  Administered 2022-03-29 (×2): 5 [IU] via SUBCUTANEOUS
  Administered 2022-03-30: 8 [IU] via SUBCUTANEOUS
  Administered 2022-03-31: 5 [IU] via SUBCUTANEOUS
  Administered 2022-03-31: 3 [IU] via SUBCUTANEOUS
  Administered 2022-03-31: 8 [IU] via SUBCUTANEOUS

## 2022-03-29 MED ORDER — ACETAMINOPHEN 650 MG RE SUPP: 650.0000 mg | Freq: Four times a day (QID) | RECTAL | Status: DC | PRN

## 2022-03-29 MED ORDER — LIDOCAINE HCL (PF) 1 % IJ SOLN
INTRAMUSCULAR | Status: AC
  Filled 2022-03-29: qty 30

## 2022-03-29 MED ORDER — HEPARIN BOLUS VIA INFUSION
2000.0000 [IU] | Freq: Once | INTRAVENOUS | Status: AC
  Administered 2022-03-29: 2000 [IU] via INTRAVENOUS
  Filled 2022-03-29: qty 2000

## 2022-03-29 MED ORDER — SODIUM CHLORIDE 0.9% FLUSH
3.0000 mL | Freq: Two times a day (BID) | INTRAVENOUS | Status: DC
  Administered 2022-03-29 – 2022-04-08 (×11): 3 mL via INTRAVENOUS

## 2022-03-29 MED ORDER — LISINOPRIL-HYDROCHLOROTHIAZIDE 20-25 MG PO TABS: 1.0000 | ORAL_TABLET | Freq: Every day | ORAL | Status: DC

## 2022-03-29 MED ORDER — CLOPIDOGREL BISULFATE 75 MG PO TABS
ORAL_TABLET | ORAL | Status: DC | PRN
  Administered 2022-03-29: 75 mg via ORAL

## 2022-03-29 MED ORDER — MIDAZOLAM HCL 2 MG/2ML IJ SOLN
INTRAMUSCULAR | Status: AC
  Filled 2022-03-29: qty 2

## 2022-03-29 MED ORDER — IOHEXOL 350 MG/ML SOLN
INTRAVENOUS | Status: DC | PRN
  Administered 2022-03-29: 85 mL

## 2022-03-29 SURGICAL SUPPLY — 23 items
BALLN SAPPHIRE 2.0X12 (BALLOONS) ×2
BALLN SAPPHIRE 2.5X20 (BALLOONS) ×2
BALLN ~~LOC~~ EUPHORA RX 2.5X15 (BALLOONS) ×2
BALLOON SAPPHIRE 2.0X12 (BALLOONS) IMPLANT
BALLOON SAPPHIRE 2.5X20 (BALLOONS) IMPLANT
BALLOON ~~LOC~~ EUPHORA RX 2.5X15 (BALLOONS) IMPLANT
BAND ZEPHYR COMPRESS 30 LONG (HEMOSTASIS) ×1 IMPLANT
CATH DIAG 6FR JR4 (CATHETERS) ×1 IMPLANT
CATH INFINITI 6F FL3.5 (CATHETERS) ×1 IMPLANT
CATH LAUNCHER 6FR JR4 (CATHETERS) ×1 IMPLANT
ELECT DEFIB PAD ADLT CADENCE (PAD) ×1 IMPLANT
GLIDESHEATH SLEND SS 6F .021 (SHEATH) ×1 IMPLANT
GUIDEWIRE INQWIRE 1.5J.035X260 (WIRE) IMPLANT
GUIDEWIRE VAS SION BLUE 190 (WIRE) ×1 IMPLANT
INQWIRE 1.5J .035X260CM (WIRE) ×2
KIT ENCORE 26 ADVANTAGE (KITS) ×1 IMPLANT
KIT HEART LEFT (KITS) ×2 IMPLANT
PACK CARDIAC CATHETERIZATION (CUSTOM PROCEDURE TRAY) ×2 IMPLANT
STENT SYNERGY XD 2.50X24 (Permanent Stent) IMPLANT
SYNERGY XD 2.50X24 (Permanent Stent) ×2 IMPLANT
SYR MEDRAD MARK 7 150ML (SYRINGE) ×2 IMPLANT
TRANSDUCER W/STOPCOCK (MISCELLANEOUS) ×2 IMPLANT
TUBING CIL FLEX 10 FLL-RA (TUBING) ×3 IMPLANT

## 2022-03-29 NOTE — ED Triage Notes (Signed)
BIB EMS.  Burning sensation in the chest for several days. Seen at urgent care on 5/21 but has not improved.  Pt reports epiosode of hematemesis today prior to EMS arrival.  Pain is worse palpation.  CBG 399  Hx of MI in the past. Allergic to asa so none given.  Unable to get an IV so no nitro given.

## 2022-03-29 NOTE — Progress Notes (Signed)
ANTICOAGULATION CONSULT NOTE - Initial Consult  Pharmacy Consult for heparin Indication: chest pain/ACS  Allergies  Allergen Reactions   Aspirin Anaphylaxis and Swelling    Tongue swells   Tramadol Hives   Hydrocodone Rash   Ibuprofen Itching   Morphine And Related Itching   Orange Fruit [Citrus] Rash    Patient Measurements: Height: 5\' 5"  (165.1 cm) Weight: 90.7 kg (200 lb)  IBW/kg (Calculated) : 57 Heparin Dosing Weight: 80kg  Vital Signs: Temp: 97.4 F (36.3 C) (05/27 0339) Temp Source: Oral (05/27 0339) BP: 115/61 (05/27 0600) Pulse Rate: 56 (05/27 0600)  Labs: Recent Labs    03/29/22 0210 03/29/22 0517  HGB 12.8  --   HCT 37.7  --   PLT 212  --   CREATININE 0.91  --   TROPONINIHS 15 44*    Estimated Creatinine Clearance: 75.9 mL/min (by C-G formula based on SCr of 0.91 mg/dL).   Medical History: Past Medical History:  Diagnosis Date   Anxiety    Coronary artery disease    Diabetes mellitus    Embolism (HCC)    Hypertension    Sarcoidosis     Assessment: 56yo female c/o burning sensation in chest, had episode of hematemesis prior to EMS arrival though tells EDP that it was a small amount of blood, Hgb at baseline; initial troponin negative but now trending up, to begin heparin.  Goal of Therapy:  Heparin level 0.3-0.7 units/ml Monitor platelets by anticoagulation protocol: Yes   Plan:  Heparin 2000 units IV bolus x1 followed by infusion at 1100 units/hr and monitor heparin levels and CBC.  56yo, PharmD, BCPS  03/29/2022,6:53 AM

## 2022-03-29 NOTE — Significant Event (Signed)
Patient with continued symptoms despite aggressive up-titration of nitro gtt. Repeat trop is pending and ECG is stable. Given degree of symptoms despite high dose nitro and history of similar symptoms prior to her PCI in the past, will proceed with cath at this time. Plan discussed with interventional cardiology, nursing, the patient and her son.  INFORMED CONSENT: I have reviewed the risks, indications, and alternatives to cardiac catheterization, possible angioplasty, and stenting with the patient. Risks include but are not limited to bleeding, infection, vascular injury, stroke, myocardial infection, arrhythmia, kidney injury, radiation-related injury in the case of prolonged fluoroscopy use, emergency cardiac surgery, and death. The patient understands the risks of serious complication is 1-2 in 1000 with diagnostic cardiac cath and 1-2% or less with angioplasty/stenting.    Laurance Flatten, MD

## 2022-03-29 NOTE — Progress Notes (Signed)
Pt complaining of 7 out of 10 chest pain on 145 mcg of nitro  Pt describes pain as sharp and burning.  MD notified
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
## 2022-03-29 NOTE — Progress Notes (Incomplete)
Pt experiencing acute chest pain wit

## 2022-03-29 NOTE — ED Notes (Signed)
Up to bathroom , states her burning sensation is getting better after medication.

## 2022-03-29 NOTE — ED Notes (Signed)
[name] [phone] would like an update asap
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
## 2022-03-29 NOTE — ED Provider Notes (Signed)
[facility] EMERGENCY DEPARTMENT Provider Note   CSN: [id number] Arrival date & time: 03/29/22  0143     History  Chief Complaint  Patient presents with   Chest Pain    [name] is a 56 y.o. female.  The history is provided by the patient.  Chest Pain She has history of hypertension, diabetes, sarcoidosis, coronary artery disease and comes in because of ongoing problems with chest pain.  Pain is described as a burning sensation in her mid chest going to both sides.  Pain has been present for the last 3 months.  It is worse with exertion but also with worse with laying flat.  She has been seen several times at urgent care.  She was prescribed famotidine, but was unable to get that medication so has taken 2 doses of omeprazole without any benefit.  There is associated dyspnea and diaphoresis.  Tonight, she did have an episode of nausea and vomiting and states she vomited a small amount of blood.  She denies any melena.  She is a non-smoker and is not consuming ethanol on a regular basis.   Home Medications Prior to Admission medications   Medication Sig Start Date End Date Taking? Authorizing Provider  acetaminophen (TYLENOL) 500 MG tablet Take 1 tablet (500 mg total) by mouth every 6 (six) hours as needed. 06/16/20   Darr, Edison Nasuti, PA-C  Blood Glucose Monitoring Suppl (TRUE METRIX METER) w/Device KIT Check blood sugar BID. 12/10/20   Charlott Rakes, MD  carvedilol (COREG) 25 MG tablet TAKE 1 TABLET (25 MG TOTAL) BY MOUTH 2 (TWO) TIMES DAILY. 02/13/21 03/12/22  Donato Heinz, MD  clopidogrel (PLAVIX) 75 MG tablet Take 1 tablet (75 mg total) by mouth daily. 03/12/22   Vanessa Kick, MD  famotidine (PEPCID) 20 MG tablet Take 1 tablet (20 mg total) by mouth 2 (two) times daily. 03/23/22   Jaynee Eagles, PA-C  glipiZIDE (GLUCOTROL) 10 MG tablet Take 1 tablet (10 mg total) by mouth 2 (two) times daily. 02/14/21 04/15/21  Charlott Rakes, MD  ipratropium (ATROVENT) 0.06 %  nasal spray Place 2 sprays into both nostrils 4 (four) times daily as needed for rhinitis. 02/14/21   Charlott Rakes, MD  lisinopril-hydrochlorothiazide (ZESTORETIC) 20-25 MG tablet Take 1 tablet by mouth daily. 03/10/22 03/10/23  Donato Heinz, MD  nitroGLYCERIN (NITROSTAT) 0.4 MG SL tablet PLACE 1 TABLET (0.4 MG TOTAL) UNDER THE TONGUE EVERY 5 (FIVE) MINUTES AS NEEDED FOR CHEST PAIN. 10/30/20 10/30/21  Donato Heinz, MD  lisinopril (ZESTRIL) 20 MG tablet Take 1 tablet (20 mg total) by mouth daily. 10/11/20 12/06/20  Donato Heinz, MD  omeprazole (PRILOSEC) 40 MG capsule Take 1 capsule (40 mg total) by mouth daily. 07/28/20 10/30/20  Hall-Potvin, [place], PA-C      Allergies    Aspirin, Tramadol, Hydrocodone, Ibuprofen, Morphine and related, and Orange fruit [citrus]    Review of Systems   Review of Systems  Cardiovascular:  Positive for chest pain.  All other systems reviewed and are negative.  Physical Exam Updated Vital Signs BP (!) 182/84   Pulse (!) 55   Temp 98.1 F (36.7 C)   Resp 12   LMP 01/24/2015 (Approximate)   SpO2 99%  Physical Exam Vitals and nursing note reviewed.  56 year old female, resting comfortably and in no acute distress. Vital signs are significant for elevated blood pressure and slightly slow heart rate. Oxygen saturation is 99%, which is normal. Head is normocephalic and atraumatic.  PERRLA, EOMI. Oropharynx is clear. Neck is nontender and supple without adenopathy or JVD. Back is nontender and there is no CVA tenderness. Lungs are clear without rales, wheezes, or rhonchi. Chest is moderately tender throughout the anterior chest wall.  There is no point tenderness.  There is no crepitus Heart has regular rate and rhythm without murmur. Abdomen is soft, flat, nontender with moderate epigastric tenderness.  There is no rebound or guarding. Extremities have no cyanosis or edema, full range of motion is present. Skin is warm and dry  without rash. Neurologic: Mental status is normal, cranial nerves are intact, moves all extremities equally.  ED Results / Procedures / Treatments   Labs (all labs ordered are listed, but only abnormal results are displayed) Labs Reviewed  BASIC METABOLIC PANEL - Abnormal; Notable for the following components:      Result Value   Glucose, Bld 394 (*)    All other components within normal limits  TROPONIN I (HIGH SENSITIVITY) - Abnormal; Notable for the following components:   Troponin I (High Sensitivity) 44 (*)    All other components within normal limits  CBC WITH DIFFERENTIAL/PLATELET  HEPARIN LEVEL (UNFRACTIONATED)  OCCULT BLOOD X 1 CARD TO LAB, STOOL  HEMOGLOBIN A1C  HIV ANTIBODY (ROUTINE TESTING W REFLEX)  D-DIMER, QUANTITATIVE  HEMOGLOBIN AND HEMATOCRIT, BLOOD  TYPE AND SCREEN  TROPONIN I (HIGH SENSITIVITY)  TROPONIN I (HIGH SENSITIVITY)    EKG EKG Interpretation  Date/Time:  Saturday Mar 29 2022 01:50:02 EDT Ventricular Rate:  58 PR Interval:  187 QRS Duration: 102 QT Interval:  444 QTC Calculation: 437 R Axis:   -32 Text Interpretation: Sinus rhythm Left ventricular hypertrophy Anterior Q waves, possibly due to LVH Abnormal T, consider ischemia, inferior leads When compared with ECG of 03/23/2022, No significant change was found Confirmed by Delora Fuel ([id number]) on 03/29/2022 2:01:24 AM  Radiology DG Chest Port 1 View  Result Date: 03/29/2022 CLINICAL DATA:  Chest pain EXAM: PORTABLE CHEST 1 VIEW COMPARISON:  03/23/2022 FINDINGS: Cardiac and mediastinal contours are within normal limits given AP technique. No focal pulmonary opacity. No pleural effusion or pneumothorax. No acute osseous abnormality. IMPRESSION: No acute cardiopulmonary process. Electronically Signed   By: Merilyn Baba M.D.   On: 03/29/2022 03:33    Procedures Procedures  Cardiac monitor shows normal sinus rhythm, per my interpretation.  Medications Ordered in ED Medications  heparin ADULT  infusion 100 units/mL (25000 units/232m) (1,100 Units/hr Intravenous New Bag/Given 03/29/22 0715)  insulin aspart (novoLOG) injection 0-15 Units (has no administration in time range)  sodium chloride flush (NS) 0.9 % injection 3 mL (has no administration in time range)  acetaminophen (TYLENOL) tablet 650 mg (has no administration in time range)    Or  acetaminophen (TYLENOL) suppository 650 mg (has no administration in time range)  ondansetron (ZOFRAN) tablet 4 mg (has no administration in time range)    Or  ondansetron (ZOFRAN) injection 4 mg (has no administration in time range)  albuterol (PROVENTIL) (2.5 MG/3ML) 0.083% nebulizer solution 2.5 mg (has no administration in time range)  alum & mag hydroxide-simeth (MAALOX/MYLANTA) 200-200-20 MG/5ML suspension 30 mL (30 mLs Oral Given 03/29/22 0336)  pantoprazole (PROTONIX) injection 40 mg (40 mg Intravenous Given 03/29/22 0336)  heparin bolus via infusion 2,000 Units (2,000 Units Intravenous Bolus from Bag 03/29/22 0715)    ED Course/ Medical Decision Making/ A&P  Medical Decision Making Amount and/or Complexity of Data Reviewed Labs: ordered. Radiology: ordered.  Risk OTC drugs. Prescription drug management. Decision regarding hospitalization.   Chest pain which seems most likely to be GERD based on history, although it is concerning that it has not responded to proton pump inhibitor.  Also consider coronary artery disease, pulmonary embolism, esophageal spasm.  Old records are reviewed, showing urgent care visits for chest pain on 5/21, 3/1.  Per cardiology office note on 01/28/2021, she had stents placed in the circumflex and posterior descending artery in December 2020.  I have reviewed and interpreted today's ECG, and it does show significant T wave abnormalities, but these are unchanged from prior ECGs and not felt to be representative of ACS.  Will check troponin x2 and also, given failure to respond to other  treatments, will check D-dimer to make sure that this is not a typical presentation of pulmonary embolism.  She will be given a therapeutic trial of an antacid and pantoprazole.  Of note, she has a severe aspirin allergy, so aspirin is not given.  I have reviewed all of her laboratory tests and interpreted them.  Initial troponin is normal, but repeat troponin is mildly elevated at 44.  This is a significant increase and indicates probable non-STEMI.  She is started on heparin.  Her other laboratory tests are significant only for elevated glucose consistent with her known history of diabetes.  Case is discussed with Dr. Tamala Julian of [facility], who agrees to admit the patient.  Cardiology consult has been placed.  CRITICAL CARE Performed by: Delora Fuel Total critical care time: 40 minutes Critical care time was exclusive of separately billable procedures and treating other patients. Critical care was necessary to treat or prevent imminent or life-threatening deterioration. Critical care was time spent personally by me on the following activities: development of treatment plan with patient and/or surrogate as well as nursing, discussions with consultants, evaluation of patient's response to treatment, examination of patient, obtaining history from patient or surrogate, ordering and performing treatments and interventions, ordering and review of laboratory studies, ordering and review of radiographic studies, pulse oximetry and re-evaluation of patient's condition.  Final Clinical Impression(s) / ED Diagnoses Final diagnoses:  Non-STEMI (non-ST elevated myocardial infarction) [facility])    Rx / DC Orders ED Discharge Orders     None         Delora Fuel, MD 56/38/75 [phone]
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
## 2022-03-29 NOTE — Progress Notes (Incomplete)
Progress Note  Patient Name: [name] Date of Encounter: 03/29/2022  [facility] Cardiologist: Donato Heinz, MD ***  Subjective   ***  Inpatient Medications    Scheduled Meds:  carvedilol  25 mg Oral BID   clopidogrel  75 mg Oral Daily   insulin aspart  0-15 Units Subcutaneous Q6H   insulin glargine-yfgn  5 Units Subcutaneous Daily   pantoprazole (PROTONIX) IV  40 mg Intravenous Q12H   sodium chloride flush  3 mL Intravenous Q12H   sodium chloride flush  3 mL Intravenous Q12H   Continuous Infusions:  sodium chloride     sodium chloride     PRN Meds: sodium chloride, acetaminophen, albuterol, diphenhydrAMINE, hydrALAZINE, labetalol, morphine injection, nitroGLYCERIN, ondansetron (ZOFRAN) IV, sodium chloride flush   Vital Signs    Vitals:   03/29/22 1601 03/29/22 1847 03/29/22 1857 03/29/22 1920  BP: 130/79 (!) 159/83 (!) 159/83 (!) 175/91  Pulse:   (!) 59 (!) 58  Resp:  19 14 14   Temp:   97.8 F (36.6 C) 97.8 F (36.6 C)  TempSrc:   Oral Oral  SpO2:   100% 100%  Weight:      Height:       No intake or output data in the 24 hours ending 03/29/22 2025    03/29/2022    6:00 AM 01/28/2021    9:25 AM 12/06/2020   10:41 AM  Last 3 Weights  Weight (lbs) 200 lb 204 lb 3.2 oz 208 lb  Weight (kg) 90.719 kg 92.625 kg 94.348 kg      Telemetry    *** - Personally Reviewed  ECG    *** - Personally Reviewed  Physical Exam  *** GEN: No acute distress.   Neck: No JVD Cardiac: RRR, no murmurs, rubs, or gallops.  Respiratory: Clear to auscultation bilaterally. GI: Soft, nontender, non-distended  MS: No edema; No deformity. Neuro:  Nonfocal  Psych: Normal affect   Labs    High Sensitivity Troponin:   Recent Labs  Lab 03/29/22 0210 03/29/22 0517 03/29/22 0820 03/29/22 1002 03/29/22 1634  TROPONINIHS 15 44* 87* 134* 302*     Chemistry Recent Labs  Lab 03/29/22 0210  NA 137  K 3.8  CL 104  CO2 23  GLUCOSE 394*  BUN 16   CREATININE 0.91  CALCIUM 9.2  GFRNONAA >60  ANIONGAP 10    Lipids  Recent Labs  Lab 03/29/22 1634  CHOL 269*  TRIG 136  HDL 48  LDLCALC 194*  CHOLHDL 5.6    Hematology Recent Labs  Lab 03/29/22 0210 03/29/22 0820  WBC 6.2  --   RBC 3.99  --   HGB 12.8 13.2  HCT 37.7 38.5  MCV 94.5  --   MCH 32.1  --   MCHC 34.0  --   RDW 11.9  --   PLT 212  --    Thyroid No results for input(s): TSH, FREET4 in the last 168 hours.  BNPNo results for input(s): BNP, PROBNP in the last 168 hours.  DDimer  Recent Labs  Lab 03/29/22 0820  DDIMER 0.29     Radiology    CARDIAC CATHETERIZATION  Result Date: 03/29/2022   RPDA-2 lesion is 99% stenosed.   RPDA-1 lesion is 40% stenosed.   2nd Diag lesion is 80% stenosed.   Previously placed Mid Cx stent (unknown type) is  widely patent.   A stent was successfully placed.   Post intervention, there is a 0% residual stenosis.  LV end diastolic pressure is normal. 1.  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 2.  High-grade second diagonal lesion which should be treated medically due to proximity to LAD. 3.  LVEDP of 11 mmHg The results were reviewed with Dr. Johney Frame. 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   DG Chest Port 1 View  Result Date: 03/29/2022 CLINICAL DATA:  Chest pain EXAM: PORTABLE CHEST 1 VIEW COMPARISON:  03/23/2022 FINDINGS: Cardiac and mediastinal contours are within normal limits given AP technique. No focal pulmonary opacity. No pleural effusion or pneumothorax. No acute osseous abnormality. IMPRESSION: No acute cardiopulmonary process. Electronically Signed   By: Merilyn Baba M.D.   On: 03/29/2022 03:33    Cardiac Studies   LHC 03/29/22:   RPDA-2 lesion is 99% stenosed.   RPDA-1 lesion is 40% stenosed.   2nd Diag lesion is 80% stenosed.   Previously placed Mid  Cx stent (unknown type) is  widely patent.   A stent was successfully placed.   Post intervention, there is a 0% residual stenosis.   LV end diastolic pressure is normal.   1.  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 2.  High-grade second diagonal lesion which should be treated medically due to proximity to LAD. 3.  LVEDP of 11 mmHg   The results were reviewed with Dr. Johney Frame.   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    Patient Profile     56 y.o. female with history of known CAD s/p PCI to Lcx and PDA in 10/2019 at [facility] hospital, HTNm HLD, DMII, and prior history of PE who presented to the ER with chest burning radiating to her back found to have elevated troponin for which Cardiology was consulted.  Assessment & Plan    #NSTEMI: #CAD:  #HTN:  #HLD:   {Are we signing off today?:[id number]}  For questions or updates, please contact [place] Please consult [email or web address] for contact info under        Signed, Freada Bergeron, MD  03/29/2022, 8:25 PM
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
## 2022-03-29 NOTE — Interval H&P Note (Signed)
History and Physical Interval Note:  03/29/2022 5:04 PM  [name]  has presented today for surgery, with the diagnosis of STEMI.  The various methods of treatment have been discussed with the patient and family. After consideration of risks, benefits and other options for treatment, the patient has consented to  Procedure(s): Coronary/Graft Acute MI Revascularization (N/A) LEFT HEART CATH AND CORONARY ANGIOGRAPHY (N/A) as a surgical intervention.  The patient's history has been reviewed, patient examined, no change in status, stable for surgery.  I have reviewed the patient's chart and labs.  Questions were answered to the patient's satisfaction.    Cath Lab Visit (complete for each Cath Lab visit)  Clinical Evaluation Leading to the Procedure:   ACS: Yes.    Non-ACS:    Anginal Classification: CCS IV  Anti-ischemic medical therapy: Maximal Therapy (2 or more classes of medications)  Non-Invasive Test Results: No non-invasive testing performed  Prior CABG: No previous CABG        [name]

## 2022-03-29 NOTE — Progress Notes (Signed)
Inpatient Diabetes Program Recommendations  AACE/ADA: New Consensus Statement on Inpatient Glycemic Control (2015)  Target Ranges:  Prepandial:   less than 140 mg/dL      Peak postprandial:   less than 180 mg/dL (1-2 hours)      Critically ill patients:  140 - 180 mg/dL    Latest Reference Range & Units 03/29/22 08:31 03/29/22 13:22  Glucose-Capillary 70 - 99 mg/dL 366 (H)  15 units Novolog  231 (H)  5 units Novolog     Latest Reference Range & Units 12/06/20 11:01  HbA1c, POC (controlled diabetic range) 0.0 - 7.0 % 11.1 !    Latest Reference Range & Units 03/29/22 08:20  Hemoglobin A1C 4.8 - 5.6 % 12.3 (H)  (306 mg/dl)    Admit with: NSTEMI  History: DM, Sarcoidosis  Home DM Meds: Glipizide 10 mg BID (NOT taking)  Current Orders: Semglee 5 units Daily      Novolog Moderate Correction Scale/ SSI (0-15 units) Q6 hours   PCP: [facility] Last seen 12/06/2020 Was given Rx to start Trulicity   Note Semglee and Novolog started today  Looks like pt has NOT been taking her Glipizide--Was given Rx to start Trulicity in Feb 2022 but unsure if pt taking that either  Needs follow up with PCP and better CBG control to prevent further Cardiac events  Will follow patient during hospitalization and assist with CBG control    Korinna Tat Tawanna Sat RN, MSN, CDE Diabetes Coordinator Inpatient Glycemic Control Team Team Pager: [phone] (8a-5p)

## 2022-03-30 ENCOUNTER — Inpatient Hospital Stay (HOSPITAL_COMMUNITY): Payer: No Typology Code available for payment source

## 2022-03-30 ENCOUNTER — Observation Stay (HOSPITAL_COMMUNITY): Payer: No Typology Code available for payment source

## 2022-03-30 DIAGNOSIS — Z833 Family history of diabetes mellitus: Secondary | ICD-10-CM | POA: Diagnosis not present

## 2022-03-30 DIAGNOSIS — D869 Sarcoidosis, unspecified: Secondary | ICD-10-CM | POA: Diagnosis present

## 2022-03-30 DIAGNOSIS — Q446 Cystic disease of liver: Secondary | ICD-10-CM | POA: Diagnosis not present

## 2022-03-30 DIAGNOSIS — E785 Hyperlipidemia, unspecified: Secondary | ICD-10-CM | POA: Diagnosis present

## 2022-03-30 DIAGNOSIS — Z79899 Other long term (current) drug therapy: Secondary | ICD-10-CM | POA: Diagnosis not present

## 2022-03-30 DIAGNOSIS — Z885 Allergy status to narcotic agent status: Secondary | ICD-10-CM | POA: Diagnosis not present

## 2022-03-30 DIAGNOSIS — I10 Essential (primary) hypertension: Secondary | ICD-10-CM | POA: Diagnosis present

## 2022-03-30 DIAGNOSIS — E669 Obesity, unspecified: Secondary | ICD-10-CM

## 2022-03-30 DIAGNOSIS — Z7902 Long term (current) use of antithrombotics/antiplatelets: Secondary | ICD-10-CM | POA: Diagnosis not present

## 2022-03-30 DIAGNOSIS — E010 Iodine-deficiency related diffuse (endemic) goiter: Secondary | ICD-10-CM | POA: Diagnosis present

## 2022-03-30 DIAGNOSIS — I214 Non-ST elevation (NSTEMI) myocardial infarction: Principal | ICD-10-CM

## 2022-03-30 DIAGNOSIS — Z886 Allergy status to analgesic agent status: Secondary | ICD-10-CM | POA: Diagnosis not present

## 2022-03-30 DIAGNOSIS — E1165 Type 2 diabetes mellitus with hyperglycemia: Secondary | ICD-10-CM

## 2022-03-30 DIAGNOSIS — K219 Gastro-esophageal reflux disease without esophagitis: Secondary | ICD-10-CM | POA: Diagnosis present

## 2022-03-30 DIAGNOSIS — Z9851 Tubal ligation status: Secondary | ICD-10-CM | POA: Diagnosis not present

## 2022-03-30 DIAGNOSIS — I639 Cerebral infarction, unspecified: Secondary | ICD-10-CM | POA: Diagnosis present

## 2022-03-30 DIAGNOSIS — Z91018 Allergy to other foods: Secondary | ICD-10-CM | POA: Diagnosis not present

## 2022-03-30 DIAGNOSIS — Z7984 Long term (current) use of oral hypoglycemic drugs: Secondary | ICD-10-CM | POA: Diagnosis not present

## 2022-03-30 DIAGNOSIS — I2510 Atherosclerotic heart disease of native coronary artery without angina pectoris: Secondary | ICD-10-CM

## 2022-03-30 DIAGNOSIS — I69354 Hemiplegia and hemiparesis following cerebral infarction affecting left non-dominant side: Secondary | ICD-10-CM | POA: Diagnosis not present

## 2022-03-30 DIAGNOSIS — K920 Hematemesis: Secondary | ICD-10-CM | POA: Diagnosis not present

## 2022-03-30 DIAGNOSIS — Z9861 Coronary angioplasty status: Secondary | ICD-10-CM

## 2022-03-30 DIAGNOSIS — Z8249 Family history of ischemic heart disease and other diseases of the circulatory system: Secondary | ICD-10-CM | POA: Diagnosis not present

## 2022-03-30 DIAGNOSIS — R200 Anesthesia of skin: Secondary | ICD-10-CM

## 2022-03-30 DIAGNOSIS — Z6833 Body mass index (BMI) 33.0-33.9, adult: Secondary | ICD-10-CM | POA: Diagnosis not present

## 2022-03-30 DIAGNOSIS — I6381 Other cerebral infarction due to occlusion or stenosis of small artery: Secondary | ICD-10-CM | POA: Diagnosis not present

## 2022-03-30 DIAGNOSIS — R079 Chest pain, unspecified: Secondary | ICD-10-CM | POA: Diagnosis present

## 2022-03-30 DIAGNOSIS — Z888 Allergy status to other drugs, medicaments and biological substances status: Secondary | ICD-10-CM | POA: Diagnosis not present

## 2022-03-30 LAB — POCT ACTIVATED CLOTTING TIME
Activated Clotting Time: 329 seconds
Activated Clotting Time: 636 seconds

## 2022-03-30 LAB — CBC
HCT: 34.6 % — ABNORMAL LOW (ref 36.0–46.0)
Hemoglobin: 12 g/dL (ref 12.0–15.0)
MCH: 33.1 pg (ref 26.0–34.0)
MCHC: 34.7 g/dL (ref 30.0–36.0)
MCV: 95.6 fL (ref 80.0–100.0)
Platelets: 171 10*3/uL (ref 150–400)
RBC: 3.62 MIL/uL — ABNORMAL LOW (ref 3.87–5.11)
RDW: 12 % (ref 11.5–15.5)
WBC: 9 10*3/uL (ref 4.0–10.5)
nRBC: 0 % (ref 0.0–0.2)

## 2022-03-30 LAB — GLUCOSE, CAPILLARY
Glucose-Capillary: 206 mg/dL — ABNORMAL HIGH (ref 70–99)
Glucose-Capillary: 263 mg/dL — ABNORMAL HIGH (ref 70–99)
Glucose-Capillary: 268 mg/dL — ABNORMAL HIGH (ref 70–99)
Glucose-Capillary: 298 mg/dL — ABNORMAL HIGH (ref 70–99)
Glucose-Capillary: 334 mg/dL — ABNORMAL HIGH (ref 70–99)

## 2022-03-30 LAB — TYPE AND SCREEN
ABO/RH(D): AB POS
Antibody Screen: NEGATIVE

## 2022-03-30 LAB — ECHOCARDIOGRAM COMPLETE
Area-P 1/2: 3.03 cm2
Height: 65 in
Weight: 3200 oz

## 2022-03-30 LAB — BASIC METABOLIC PANEL
Anion gap: 5 (ref 5–15)
BUN: 13 mg/dL (ref 6–20)
CO2: 25 mmol/L (ref 22–32)
Calcium: 8.3 mg/dL — ABNORMAL LOW (ref 8.9–10.3)
Chloride: 106 mmol/L (ref 98–111)
Creatinine, Ser: 0.81 mg/dL (ref 0.44–1.00)
GFR, Estimated: >60 mL/min (ref >60)
Glucose, Bld: 379 mg/dL — ABNORMAL HIGH (ref 70–99)
Potassium: 4.1 mmol/L (ref 3.5–5.1)
Sodium: 136 mmol/L (ref 135–145)

## 2022-03-30 LAB — MAGNESIUM: Magnesium: 2 mg/dL (ref 1.7–2.4)

## 2022-03-30 LAB — PHOSPHORUS: Phosphorus: 3.6 mg/dL (ref 2.5–4.6)

## 2022-03-30 LAB — TROPONIN I (HIGH SENSITIVITY)
Troponin I (High Sensitivity): 184 ng/L (ref <18)
Troponin I (High Sensitivity): 162 ng/L (ref <18)

## 2022-03-30 MED ORDER — CHLORHEXIDINE GLUCONATE CLOTH 2 % EX PADS
6.0000 | MEDICATED_PAD | Freq: Every day | CUTANEOUS | Status: DC
  Administered 2022-03-30 – 2022-04-08 (×7): 6 via TOPICAL

## 2022-03-30 MED ORDER — PANTOPRAZOLE SODIUM 40 MG IV SOLR
40.0000 mg | Freq: Every day | INTRAVENOUS | Status: DC
  Filled 2022-03-30: qty 10

## 2022-03-30 MED ORDER — LABETALOL HCL 5 MG/ML IV SOLN
10.0000 mg | INTRAVENOUS | Status: DC | PRN
  Administered 2022-03-30 – 2022-04-08 (×3): 10 mg via INTRAVENOUS
  Filled 2022-03-30: qty 4

## 2022-03-30 MED ORDER — INSULIN GLARGINE-YFGN 100 UNIT/ML ~~LOC~~ SOLN
7.0000 [IU] | Freq: Once | SUBCUTANEOUS | Status: DC
  Filled 2022-03-30: qty 0.07

## 2022-03-30 MED ORDER — SODIUM CHLORIDE 0.9 % IV SOLN: INTRAVENOUS | Status: DC

## 2022-03-30 MED ORDER — LABETALOL HCL 5 MG/ML IV SOLN
20.0000 mg | Freq: Once | INTRAVENOUS | Status: AC
  Administered 2022-03-30: 20 mg via INTRAVENOUS
  Filled 2022-03-30: qty 4

## 2022-03-30 MED ORDER — PERFLUTREN LIPID MICROSPHERE
1.0000 mL | INTRAVENOUS | Status: AC | PRN
  Administered 2022-03-30: 4 mL via INTRAVENOUS

## 2022-03-30 MED ORDER — ACETAMINOPHEN 160 MG/5ML PO SOLN: 650.0000 mg | ORAL | Status: DC | PRN

## 2022-03-30 MED ORDER — IOHEXOL 350 MG/ML SOLN
100.0000 mL | Freq: Once | INTRAVENOUS | Status: AC | PRN
  Administered 2022-03-30: 100 mL via INTRAVENOUS

## 2022-03-30 MED ORDER — CLEVIDIPINE BUTYRATE 0.5 MG/ML IV EMUL
0.0000 mg/h | INTRAVENOUS | Status: DC
  Administered 2022-03-30 (×2): 2 mg/h via INTRAVENOUS
  Filled 2022-03-30 (×3): qty 50

## 2022-03-30 MED ORDER — MUPIROCIN 2 % EX OINT
1.0000 "application " | TOPICAL_OINTMENT | Freq: Two times a day (BID) | CUTANEOUS | Status: AC
  Administered 2022-03-30 – 2022-04-04 (×10): 1 via NASAL
  Filled 2022-03-30: qty 22

## 2022-03-30 MED ORDER — ACETAMINOPHEN 325 MG PO TABS
650.0000 mg | ORAL_TABLET | ORAL | Status: DC | PRN
  Administered 2022-03-31 – 2022-04-03 (×7): 650 mg via ORAL
  Filled 2022-03-30 (×7): qty 2

## 2022-03-30 MED ORDER — STROKE: EARLY STAGES OF RECOVERY BOOK
Freq: Once | Status: AC
  Filled 2022-03-30 (×2): qty 1

## 2022-03-30 MED ORDER — PANTOPRAZOLE SODIUM 40 MG PO TBEC: 40.0000 mg | DELAYED_RELEASE_TABLET | Freq: Two times a day (BID) | ORAL | Status: DC

## 2022-03-30 MED ORDER — TENECTEPLASE FOR STROKE
0.2500 mg/kg | PACK | Freq: Once | INTRAVENOUS | Status: AC
  Administered 2022-03-30: 23 mg via INTRAVENOUS
  Filled 2022-03-30: qty 10

## 2022-03-30 MED ORDER — SENNOSIDES-DOCUSATE SODIUM 8.6-50 MG PO TABS
1.0000 | ORAL_TABLET | Freq: Two times a day (BID) | ORAL | Status: DC
  Administered 2022-03-31 – 2022-04-08 (×15): 1 via ORAL
  Filled 2022-03-30 (×17): qty 1

## 2022-03-30 MED ORDER — ACETAMINOPHEN 650 MG RE SUPP: 650.0000 mg | RECTAL | Status: DC | PRN

## 2022-03-30 MED ORDER — CLEVIDIPINE BUTYRATE 0.5 MG/ML IV EMUL: 0.0000 mg/h | INTRAVENOUS | Status: DC

## 2022-03-30 MED ORDER — CHLORHEXIDINE GLUCONATE CLOTH 2 % EX PADS
6.0000 | MEDICATED_PAD | Freq: Every day | CUTANEOUS | Status: DC
  Administered 2022-03-31: 6 via TOPICAL

## 2022-03-30 MED ORDER — ROSUVASTATIN CALCIUM 20 MG PO TABS
20.0000 mg | ORAL_TABLET | Freq: Every day | ORAL | Status: DC
  Administered 2022-03-30: 20 mg via ORAL
  Filled 2022-03-30: qty 1

## 2022-03-30 MED ORDER — INSULIN GLARGINE-YFGN 100 UNIT/ML ~~LOC~~ SOLN
12.0000 [IU] | Freq: Every day | SUBCUTANEOUS | Status: DC
  Filled 2022-03-30: qty 0.12

## 2022-03-30 MED ORDER — INSULIN ASPART 100 UNIT/ML IJ SOLN
4.0000 [IU] | Freq: Three times a day (TID) | INTRAMUSCULAR | Status: DC
  Administered 2022-03-31 (×3): 4 [IU] via SUBCUTANEOUS

## 2022-03-30 NOTE — Assessment & Plan Note (Addendum)
--  continue Plavix, statin; cannot tolerate ASA

## 2022-03-30 NOTE — Plan of Care (Signed)
  Problem: Education: Goal: Knowledge of General Education information will improve Description Including pain rating scale, medication(s)/side effects and non-pharmacologic comfort measures Outcome: Progressing   Problem: Clinical Measurements: Goal: Will remain free from infection Outcome: Progressing Goal: Cardiovascular complication will be avoided Outcome: Progressing   Problem: Activity: Goal: Risk for activity intolerance will decrease Outcome: Progressing   

## 2022-03-30 NOTE — Progress Notes (Signed)
Pt was experiencing 8/10 sharp chest pain with no radiation to arm or neck. Nitro was given once at 2110 and at 2135. Pt chest pain subsided for a couple of hours and came back around 0300. TRH on call DR was paged about the situation and ordered a stat EKG as well as troponin levels in the AM. No EKG changes noted. Will continue to monitor

## 2022-03-30 NOTE — Assessment & Plan Note (Addendum)
--  s/p LHC and DES with excellent results, with plan to medically manage D2 disease. --TTE: LVEF 60-65%, no WMA, normal RV systolic function --Cannot tolerate ASA, will continue Plavix, Coreg, Crestor (started), lisinopril with repeat lipids in 6-8 weeks.

## 2022-03-30 NOTE — Code Documentation (Signed)
Patient complains of new onset left sided sensory deficits of her face, arm, and leg. Patient has a history of DM2, sarcoidosis, GERD, NSTEMI. LKW is at 1250. Code stroke activated at 1332 by 2C staff by order of Dr. Irene Limbo. Patient taken to CT for CT, CTA, and CT of forearm x2. NIH 6 on examination.   Patient given TnK 23mg  at 1405. After administration of TnK the patient had complaints of oral mucosal bleeding, headache, chest pain, and significant swelling of right radial aspect of her forearm. CT of forearm performed x 2 due to poor quality of 1st image. ED provider assessed the site. Patient has strong radial pulse, but there is swelling, and she endorses pain. Neuro MD put stat vascular consult. This was the site that the cath lab accessed. EKG was negative for ST elevation. CT. Patient became hypertensive in the CT and labetalol x 2 given to maintain goal BP. Patient was transferred to 4N ICU-26. Upon arrival to 4N patient was still hypertensive and was given 10 mg labetalol and Rns initiated Cleviprex gtt.  Rapid Response RN

## 2022-03-30 NOTE — Progress Notes (Signed)
PHARMACIST CODE STROKE RESPONSE  Notified to mix TNK at 1403 by Dr. Thomasena Edis Delivered TNK to RN at 1405  TNK dose = 23 mg IV over 5 seconds.   Issues/delays encountered (if applicable):   Daylene Posey 03/30/22 2:07 PM

## 2022-03-30 NOTE — Progress Notes (Signed)
Pt s/p TNK for probable embolic stroke, transferred to neuro ICU under neurology, medical consult transferred to PCCM.  TRH will sign off for now.  Brendia Sacks, MD [facility]

## 2022-03-30 NOTE — Assessment & Plan Note (Signed)
--  significance unclear, follow-up with GI as an outpatient

## 2022-03-30 NOTE — Progress Notes (Signed)
Progress Note   Patient: [name] JFH:[id number] DOB: February 21, 1966 DOA: 03/29/2022     0 DOS: the patient was seen and examined on 03/30/2022   Brief hospital course: 56 year old woman PMH including CAD, diabetes mellitus type 2, noncompliance with medications, presented with chest pain, GERD symptoms and reported hematemesis.  Admitted for evaluation of chest pain, ruled in for NSTEMI, underwent cardiac catheterization with stent placement and was cleared by cardiology for discharge.  Hospitalization notable for hyperglycemia with hemoglobin A1c 12.3, started on insulin, still adjusting.  Discharge was contemplated 5/28, patient developed sudden onset left-sided numbness and weakness, code stroke was called and is in process.  Assessment and Plan: * NSTEMI (non-ST elevated myocardial infarction) [facility]) --s/p LHC and DES with excellent results, with plan to medically manage D2 disease. --TTE: LVEF 60-65%, no WMA, normal RV systolic function --Cannot tolerate ASA, will continue Plavix, Coreg, Crestor (started), lisinopril with repeat lipids in 6-8 weeks.  Left sided numbness --sudden onset around 12:20 PM today; LUE, LUE, face; left LE weakness 4/5 but effort appears to be submaximal --Code stroke called, neurology consulted  CAD S/P percutaneous coronary angioplasty --continue Plavix, statin; cannot tolerate ASA  Uncontrolled type 2 diabetes mellitus with hyperglycemia, without long-term current use of insulin (HCC) --Hgb A1c 12.5; was noncompliant with meds as outpatient --CBG poorly controlled; best tx at this time would be insulin if patient will accept --increase basal, change SSI, add meal coverage --dietician consult  Cystic disease of liver --significance unclear, follow-up with GI as an outpatient  Hematemesis --by report prior to arrival --Hgb stable, no reported recurrence; consider MWT; in absence of further issues, NSTEMI and code stroke evaluation, will defer GI  evaluation to outpt setting. --continue PPI  Essential hypertension, benign --stable; resume Coreg, lisinopril-HCTZ when appropriate  Hyperlipidemia --started on statin  Obesity (BMI 30-39.9) --follow-up as an outpatient, encourage weight loss        Subjective:  Patient reports sudden onset left-sided numbness arms legs and face about 1230 or so today.  No difficulty speaking or swallowing.  She reports left leg weakness as well had difficulty ambulating.  Physical Exam: Vitals:   03/30/22 0025 03/30/22 0253 03/30/22 0716 03/30/22 1057  BP: 113/68 (!) 114/57 110/65 118/62  Pulse: 70 70 66 72  Resp: 20 16 17 17   Temp: 98.7 F (37.1 C) 98.3 F (36.8 C) 98.4 F (36.9 C) 98.4 F (36.9 C)  TempSrc: Oral Oral Oral Oral  SpO2: 99% 100% 100% 99%  Weight:      Height:       Physical Exam Vitals reviewed.  Constitutional:      General: She is not in acute distress.    Appearance: She is not ill-appearing or toxic-appearing.  Cardiovascular:     Rate and Rhythm: Normal rate and regular rhythm.     Heart sounds: No murmur heard. Pulmonary:     Effort: Pulmonary effort is normal. No respiratory distress.     Breath sounds: No wheezing, rhonchi or rales.  Musculoskeletal:     Right lower leg: No edema.     Left lower leg: No edema.  Skin:    Findings: No lesion.  Neurological:     Mental Status: She is alert.     Cranial Nerves: No cranial nerve deficit.     Comments: Effort does not appear to be maximal; may have some dyscoordination left arm; LLE 4/5 strength; grips symmetric, no UE weakness noted; subjective decreased sensation LUE, LLE, no pronator drift  Psychiatric:        Mood and Affect: Mood normal.     Comments: Seems relatively unconcerned about numbness    Data Reviewed:  Troponins noted CBC stable BMP noted CBG high  Family Communication: none  Disposition: Status is: Inpatient Remains inpatient appropriate because: s/p LHC w/ intervention; now  with left-sided weakness and numbness, code stroke in process, hyperglycemia  Planned Discharge Destination: Home    Time spent: 45 minutes  Author: Brendia Sacks, MD 03/30/2022 1:47 PM  For on call review [email or web address].

## 2022-03-30 NOTE — Assessment & Plan Note (Addendum)
--  by report prior to arrival --Hgb stable, no reported recurrence; consider MWT; in absence of further issues, NSTEMI and code stroke evaluation, will defer GI evaluation to outpt setting. --continue PPI

## 2022-03-30 NOTE — Assessment & Plan Note (Signed)
--  sudden onset around 12:20 PM today; LUE, LUE, face; left LE weakness 4/5 but effort appears to be submaximal --Code stroke called, neurology consulted

## 2022-03-30 NOTE — Assessment & Plan Note (Signed)
--  stable; resume Coreg, lisinopril-HCTZ when appropriate

## 2022-03-30 NOTE — Assessment & Plan Note (Signed)
--  follow-up as an outpatient, encourage weight loss

## 2022-03-30 NOTE — H&P (Signed)
Please see consult note written 03/30/2022.   Electronically signed by:  Marisue Humble, MD Page: [phone] 03/30/2022, 5:11 PM

## 2022-03-30 NOTE — Hospital Course (Addendum)
56 year old female with past medical history of coronary artery disease, diabetes mellitus type 2, noncompliance with medication, GERD presented to hospital with hematemesis chest pain and GERD symptoms.  Patient was admitted to the hospital for evaluation of chest pain, ruled in for NSTEMI, underwent cardiac catheterization with stent placement and was cleared by cardiology for discharge.  Hospitalization was notable for hyperglycemia with hemoglobin A1c 12.3, started on insulin. Discharge was contemplated 5/28, patient developed sudden onset left-sided numbness and weakness, code stroke was called.  Patient was then transferred to the neuro ICU and TNK was administered.  Patient was subsequently transferred out of the ICU to the medical team.  At this time, patient has remained stable though she complains of left-sided numbness and left lower extremity weakness.  Medically stable for disposition.  Has been awaiting for rehabilitation placement.  Assessment and Plan: Principal Problem:   NSTEMI (non-ST elevated myocardial infarction) (HCC) Active Problems:   Left sided numbness   CAD S/P percutaneous coronary angioplasty   Uncontrolled type 2 diabetes mellitus with hyperglycemia, without long-term current use of insulin (HCC)   Cystic disease of liver   Essential hypertension, benign   Hyperlipidemia   Obesity (BMI 30-39.9)   Stroke (cerebrum) (HCC)   NSTEMI, CAD: Status post left heart catheterization with stent placement.  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  Stable at this time.  No further chest pain during hospitalization.  Acute bilateral CVAs, predominantly right sided, consistent with embolic source.  Thought to be secondary to recent catheterization/ cryptogenic in nature.  No cardioembolic source noted on 2D echocardiogram.  TEE done on 04/02/2022 showed LV ejection fraction of 60 to 65% with no atrial thrombus.  Bubble  study was negative as well.  Plan for 30-day monitoring on discharge. Physical therapy has recommended CIR at this time.  Neurology has signed off at this time.  Recommendation is to continue Plavix and statin on discharge..  Still has left-sided weakness and numbness.  Uncontrolled type 2 diabetes mellitus with hyperglycemia, without long-term current use of insulin (HCC):  Latest HbA1c 12.5%.  Continue basal and sliding scale insulin.  Patient would likely benefit from  semaglutide as outpatient  given concomitant obesity.    Cystic disease of liver Will need to follow-up with GI as outpatient.  Significance unknown at this time.   Hematemesis Resolved.  Continue PPI   Essential HTN:  Continue Coreg.  Lisinopril has been changed to losartan as per cardiology.  Blood pressure seems to be stable at this time.  Latest blood pressure of 120/74.  Hyperlipidemia.  Latest LDL 173. Continue Crestor.  LDL goal less than 55.   Obesity: Body mass index is 33.28 kg/m. Could benefit from GLP agonist due to diabetes and obesity.   RUE edema: Negative venous and arterial U/S.  Mild ecchymosis improving.  Enlarged thyroid gland. Plan for thyroid U/S as outpatient.   Debility deconditioning.  At this time, patient has been considered for CIR. Medically stable for disposition

## 2022-03-30 NOTE — Progress Notes (Signed)
Inpatient Diabetes Program Recommendations  AACE/ADA: New Consensus Statement on Inpatient Glycemic Control (2015)  Target Ranges:  Prepandial:   less than 140 mg/dL      Peak postprandial:   less than 180 mg/dL (1-2 hours)      Critically ill patients:  140 - 180 mg/dL    Latest Reference Range & Units 03/29/22 08:31 03/29/22 13:22 03/29/22 23:48 03/30/22 06:55  Glucose-Capillary 70 - 99 mg/dL 950 (H)  15 units Novolog  231 (H)  5 units Novolog  5 units Semglee @1332   295 (H)  8 units Novolog  268 (H)  8 units Novolog   5 units Semglee @1028   (H): Data is abnormally high  Latest Reference Range & Units 03/29/22 08:20  Hemoglobin A1C 4.8 - 5.6 % 12.3 (H)   (306 mg/dl)    Admit with: NSTEMI   History: DM, Sarcoidosis   Home DM Meds: Glipizide 10 mg BID (NOT taking)   Current Orders: Semglee 5 units Daily                            Novolog Moderate Correction Scale/ SSI (0-15 units) Q6 hours     PCP: [facility] Last seen 12/06/2020 Was given Rx to start Trulicity      MD- Please consider:  1. Increase Semglee to 12 units Daily (~0.15 units/kg)  2. Change Novolog SSi to TID AC + HS  3. Start Novolog Meal Coverage: Novolog 4 units TID with meals HOLD of pt eats <50% meals  4. Given current A1c of 12.3%, needs close follow up for diabetes and better CBG control to prevent further Cardiac events--Looks like pt has NOT been taking her Glipizide--Was given Rx to start Trulicity in Feb 2022 but unsure if pt taking that either? Will pt need Insulin for home? Diabetes RN will plan to see pt Monday 5/28 to discuss A1c and need for better control       --Will follow patient during hospitalization--  Sunday RN, MSN, CDE Diabetes Coordinator Inpatient Glycemic Control Team Team Pager: [phone] (8a-5p)

## 2022-03-30 NOTE — Progress Notes (Signed)
Left message with misha's phone [phone] to let her know about patient's complaint of numbness and weakness on left side, rapid nurse called to room and patient is going for CT scan, I will have her nurse call her back at this number with updates as we get them

## 2022-03-30 NOTE — Assessment & Plan Note (Signed)
--  started on statin

## 2022-03-30 NOTE — Consult Note (Signed)
Neurology Consult H&P  [name] MR# [id number] 03/30/2022   CC: LLE numbness and weakness  History is obtained from: patient and chart.  HPI: [name] is a 56 y.o. female PMHx as reviewed below CAD, DM, presented w/ CP, s/p LHC w/ stent placement, was planned for d/c today when developed left-sided numbness and LLE weakness about 1220. Bedside exam showed decreased LLE numbness, possibly objective LLE weakness compared to right    LKW: 1220 tNK given: yes IR Thrombectomy No, medication Modified Rankin Scale: 0-Completely asymptomatic and back to baseline post- stroke NIHSS: 6  ROS: A complete ROS was performed and is negative except as noted in the HPI.   Past Medical History:  Diagnosis Date   Anxiety    Coronary artery disease    Diabetes mellitus    Embolism ([place])    Hypertension    Sarcoidosis      Family History  Problem Relation Age of Onset   Healthy Mother    Cancer Maternal Grandmother    Diabetes Maternal Grandmother    Heart disease Maternal Grandmother    Tuberculosis Maternal Grandfather     Social History:  reports that she has never smoked. She has never used smokeless tobacco. She reports current alcohol use. She reports that she does not use drugs.   Prior to Admission medications   Medication Sig Start Date End Date Taking? Authorizing Provider  acetaminophen (TYLENOL) 500 MG tablet Take 1 tablet (500 mg total) by mouth every 6 (six) hours as needed. Patient taking differently: Take 500 mg by mouth every 6 (six) hours as needed for moderate pain or headache. 06/16/20  Yes Darr, Edison Nasuti, PA-C  Blood Glucose Monitoring Suppl (TRUE METRIX METER) w/Device KIT Check blood sugar BID. 12/10/20  Yes Newlin, Enobong, MD  carvedilol (COREG) 25 MG tablet TAKE 1 TABLET (25 MG TOTAL) BY MOUTH 2 (TWO) TIMES DAILY. Patient taking differently: Take 25 mg by mouth 2 (two) times daily. 02/13/21 03/29/22 Yes Donato Heinz, MD  clopidogrel (PLAVIX) 75 MG  tablet Take 1 tablet (75 mg total) by mouth daily. 03/12/22  Yes Hagler, Aaron Edelman, MD  ipratropium (ATROVENT) 0.06 % nasal spray Place 2 sprays into both nostrils 4 (four) times daily as needed for rhinitis. 02/14/21  Yes Charlott Rakes, MD  lisinopril-hydrochlorothiazide (ZESTORETIC) 20-25 MG tablet Take 1 tablet by mouth daily. 03/10/22 03/10/23 Yes Donato Heinz, MD  nitroGLYCERIN (NITROSTAT) 0.4 MG SL tablet PLACE 1 TABLET (0.4 MG TOTAL) UNDER THE TONGUE EVERY 5 (FIVE) MINUTES AS NEEDED FOR CHEST PAIN. Patient taking differently: Place 0.4 mg under the tongue every 5 (five) minutes as needed for chest pain. 10/30/20 03/29/22 Yes Donato Heinz, MD  famotidine (PEPCID) 20 MG tablet Take 1 tablet (20 mg total) by mouth 2 (two) times daily. 03/23/22   Jaynee Eagles, PA-C  glipiZIDE (GLUCOTROL) 10 MG tablet Take 1 tablet (10 mg total) by mouth 2 (two) times daily. Patient not taking: Reported on 03/29/2022 02/14/21 04/15/21  Charlott Rakes, MD  lisinopril (ZESTRIL) 20 MG tablet Take 1 tablet (20 mg total) by mouth daily. 10/11/20 12/06/20  Donato Heinz, MD  omeprazole (PRILOSEC) 40 MG capsule Take 1 capsule (40 mg total) by mouth daily. 07/28/20 10/30/20  Hall-Potvin, [place], PA-C    Exam: Current vital signs: BP 118/62 (BP Location: Left Arm)   Pulse 72   Temp 98.4 F (36.9 C) (Oral)   Resp 17   Ht '5\' 5"'  (1.651 m)   Wt 90.7  kg Comment: from 2022 records, please update  LMP 01/24/2015 (Approximate)   SpO2 99%   BMI 33.28 kg/m   Physical Exam  Constitutional: Appears well-developed and well-nourished.  Psych: Affect appropriate to situation Eyes: No scleral injection HENT: No OP obstruction. Head: Normocephalic.  Cardiovascular: Normal rate and regular rhythm.  Respiratory: Effort normal, symmetric excursions bilaterally, no audible wheezing. GI: Soft.  No distension. There is no tenderness.  Skin: WDI  Neuro: Mental Status: Patient is awake, alert, oriented to  person, place, month, year, and situation. Patient is able to give a clear and coherent history. Speech  fluent, intact comprehension and repetition. No signs of aphasia or neglect. Visual Fields are full. Pupils are equal, round, and reactive to light. EOMI without ptosis or diplopia.  Facial sensation is symmetric to temperature Facial movement is symmetric.  Hearing is intact to voice. Uvula midline and palate elevates symmetrically. Shoulder shrug is symmetric. Tongue is midline without atrophy or fasciculations.  Tone is normal. Bulk is normal. 2/5 strength left lower extremity. Sensation severely impaired to noxious stimulus left lower extremity.   Deep Tendon Reflexes: 2+ and symmetric in the biceps and patellae. Toes are downgoing bilaterally. FNF and HKS are intact bilaterally. Gait - Deferred  I have reviewed labs in epic and the pertinent results are: CBG 298  I have reviewed the images obtained: NCT head showed  No evidence of acute intracranial abnormality.  ASPECTS is 10. CTA head and neck showed No evidence of acute intracranial abnormality. No emergent large vessel occlusion or proximal hemodynamically significant stenosis.  Assessment: [name] is a 56 y.o. female PMHx as noted above status post 2 stents with right radial access developed acute onset left lower extremity weakness and loss of left lower extremity sensation.  NIHSS of 6 and she was deemed an appropriate candidate for TNK.  We discussed her level of disability and treatment of stroke with tNK. We also discussed the risks and benefits associated with tPA including uncontrollable and potentially fatal bleeding. She took a few minutes to consider then tearfully consented to move forward.   After administration of TNK patient was noted to have oral mucosal bleeding elevated blood pressure and significant swelling and pain in the right wrist at the radial access point from the heart cath yesterday.  She  also complaining of significant headache and chest pain.  Repeat CT head noncontrast did not show any bleeding.  Examination of her mouth did not show any lacerations or open ulcers and being that she recently had 2 stents placed and was only on Brilinta due to aspirin allergy local tranexamic acid was applied to the area with intensive monitoring in order to control bleeding.  Her right wrist was elevated ice packs were placed and a repeat CT scan was done without contrast as the patient refused contrast as she was under the impression that contrast was the source of the swelling and pain in her wrist and pain in her chest.  Noncontrast CT right forearm did not show fluid collection hematoma seroma.  The patient was then transported up to the neuro ICU and a referral was placed to vascular surgery to evaluate for the possibility of compartment syndrome.   Impression:  Probable periprocedural embolic stroke. Left lower extremity weakness Left lower extremity sensory loss NIHSS 6 Received thrombolytics in the emergency room Mucosal bleeding possibly related to thrombolytics-s/p local TXA with adequate hemostasis. CAD Aspirin allergy HTN   Plan: Patient administered tPA/tNK, with no contraindications  to administration.  Vascular surgery consultation was placed to evaluate for possible right forearm compartment syndrome.  Plan: Admit to ICU for 24 hours with frequent neuro checks.  Labs: Coags, CBC, type and cross, CMP, Mg, Phos, fasting lipids, HbA1c, hCRP, troponins, urinalysis. Replete electrolytes as needed. Post tPA blood pressure: <180/150m Hg for first 24 hours the gradual reduction over next few days. Maintain O2 sats > 94% Normothermia - For temperature >37.5C - acetaminophen [id number] hours PRN. CT head in 24 hours if patient has not had MRI brain. No antiplatelet medications or anticoagulants for 24 hours following tPA/tNK and after imaging CT/MRI.  Blood pressure management per  post tPA/tNK-protocol order set.  Gentle IV hydration.  Relative euglycemia and treat hyperglycemia (>200 mg/dL)/hypoglycemia (< 6020mL). PT/OT/Speech. MRI of brain, TTE.  Telemetry monitoring for arrhythmia. Bedside swallow screen. Stroke education. Recommend PT/OT/SLP consult. If there is acute neurologic decline STAT CT head.    PPx:     GI - H2     DVT - SCDs for now. Precautions: Aspiration/seizure/fall.  Discussed patient's status with family and ED.   This patient is critically ill and at significant risk of neurological worsening, death and care requires constant monitoring of vital signs, hemodynamics,respiratory and cardiac monitoring, neurological assessment, discussion with family, other specialists and medical decision making of high complexity. I spent 135 minutes of neurocritical care time  in the care of  this patient. This was time spent independent of any time provided by nurse practitioner or PA.  Electronically signed by:  HunLynnae SandhoffD Page: [phone]/2023, 1:35 PM  If 7pm- 7am, please page neurology on call as listed in [facility]

## 2022-03-30 NOTE — Assessment & Plan Note (Addendum)
--  Hgb A1c 12.5; was noncompliant with meds as outpatient --CBG poorly controlled; best tx at this time would be insulin if patient will accept --increase basal, change SSI, add meal coverage --dietician consult
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
## 2022-03-31 ENCOUNTER — Inpatient Hospital Stay (HOSPITAL_COMMUNITY): Payer: No Typology Code available for payment source

## 2022-03-31 DIAGNOSIS — M79601 Pain in right arm: Secondary | ICD-10-CM

## 2022-03-31 DIAGNOSIS — R233 Spontaneous ecchymoses: Secondary | ICD-10-CM

## 2022-03-31 DIAGNOSIS — M7989 Other specified soft tissue disorders: Secondary | ICD-10-CM

## 2022-03-31 DIAGNOSIS — I639 Cerebral infarction, unspecified: Secondary | ICD-10-CM

## 2022-03-31 DIAGNOSIS — I10 Essential (primary) hypertension: Secondary | ICD-10-CM

## 2022-03-31 LAB — HEMOGLOBIN A1C
Hgb A1c MFr Bld: 12.8 % — ABNORMAL HIGH (ref 4.8–5.6)
Mean Plasma Glucose: 320.66 mg/dL

## 2022-03-31 LAB — CBC
HCT: 36.5 % (ref 36.0–46.0)
Hemoglobin: 12.6 g/dL (ref 12.0–15.0)
MCH: 32.3 pg (ref 26.0–34.0)
MCHC: 34.5 g/dL (ref 30.0–36.0)
MCV: 93.6 fL (ref 80.0–100.0)
Platelets: 186 10*3/uL (ref 150–400)
RBC: 3.9 MIL/uL (ref 3.87–5.11)
RDW: 11.9 % (ref 11.5–15.5)
WBC: 7.3 10*3/uL (ref 4.0–10.5)
nRBC: 0 % (ref 0.0–0.2)

## 2022-03-31 LAB — BASIC METABOLIC PANEL
Anion gap: 6 (ref 5–15)
BUN: 7 mg/dL (ref 6–20)
CO2: 26 mmol/L (ref 22–32)
Calcium: 8.8 mg/dL — ABNORMAL LOW (ref 8.9–10.3)
Chloride: 107 mmol/L (ref 98–111)
Creatinine, Ser: 0.72 mg/dL (ref 0.44–1.00)
GFR, Estimated: >60 mL/min (ref >60)
Glucose, Bld: 188 mg/dL — ABNORMAL HIGH (ref 70–99)
Potassium: 3.5 mmol/L (ref 3.5–5.1)
Sodium: 139 mmol/L (ref 135–145)

## 2022-03-31 LAB — GLUCOSE, CAPILLARY
Glucose-Capillary: 178 mg/dL — ABNORMAL HIGH (ref 70–99)
Glucose-Capillary: 171 mg/dL — ABNORMAL HIGH (ref 70–99)
Glucose-Capillary: 262 mg/dL — ABNORMAL HIGH (ref 70–99)
Glucose-Capillary: 276 mg/dL — ABNORMAL HIGH (ref 70–99)
Glucose-Capillary: 187 mg/dL — ABNORMAL HIGH (ref 70–99)

## 2022-03-31 LAB — LIPID PANEL
Cholesterol: 254 mg/dL — ABNORMAL HIGH (ref 0–200)
HDL: 42 mg/dL (ref >40)
LDL Cholesterol: 173 mg/dL — ABNORMAL HIGH (ref 0–99)
Total CHOL/HDL Ratio: 6 RATIO
Triglycerides: 194 mg/dL — ABNORMAL HIGH (ref <150)
VLDL: 39 mg/dL (ref 0–40)

## 2022-03-31 MED ORDER — ROSUVASTATIN CALCIUM 20 MG PO TABS
40.0000 mg | ORAL_TABLET | Freq: Every day | ORAL | Status: DC
  Administered 2022-03-31 – 2022-04-08 (×9): 40 mg via ORAL
  Filled 2022-03-31 (×9): qty 2

## 2022-03-31 MED ORDER — INSULIN ASPART 100 UNIT/ML IJ SOLN
0.0000 [IU] | Freq: Three times a day (TID) | INTRAMUSCULAR | Status: DC
  Administered 2022-03-31: 8 [IU] via SUBCUTANEOUS

## 2022-03-31 MED ORDER — CLOPIDOGREL BISULFATE 75 MG PO TABS: 75.0000 mg | ORAL_TABLET | Freq: Every day | ORAL | Status: DC

## 2022-03-31 MED ORDER — FENTANYL CITRATE PF 50 MCG/ML IJ SOSY
12.5000 ug | PREFILLED_SYRINGE | Freq: Once | INTRAMUSCULAR | Status: AC
  Administered 2022-03-31: 12.5 ug via INTRAVENOUS
  Filled 2022-03-31: qty 1

## 2022-03-31 MED ORDER — PANTOPRAZOLE SODIUM 40 MG PO TBEC
40.0000 mg | DELAYED_RELEASE_TABLET | Freq: Every day | ORAL | Status: DC
  Administered 2022-03-31 – 2022-04-07 (×8): 40 mg via ORAL
  Filled 2022-03-31 (×8): qty 1

## 2022-03-31 MED ORDER — DIPHENHYDRAMINE HCL 50 MG/ML IJ SOLN: 12.5000 mg | Freq: Four times a day (QID) | INTRAMUSCULAR | Status: DC | PRN

## 2022-03-31 MED ORDER — CLOPIDOGREL BISULFATE 75 MG PO TABS
75.0000 mg | ORAL_TABLET | Freq: Every day | ORAL | Status: DC
  Administered 2022-03-31 – 2022-04-08 (×9): 75 mg via ORAL
  Filled 2022-03-31 (×9): qty 1

## 2022-03-31 MED ORDER — INSULIN GLARGINE-YFGN 100 UNIT/ML ~~LOC~~ SOLN
14.0000 [IU] | Freq: Two times a day (BID) | SUBCUTANEOUS | Status: DC
  Administered 2022-03-31 – 2022-04-08 (×17): 14 [IU] via SUBCUTANEOUS
  Filled 2022-03-31 (×19): qty 0.14

## 2022-03-31 MED ORDER — DIPHENHYDRAMINE HCL 25 MG PO CAPS
25.0000 mg | ORAL_CAPSULE | Freq: Four times a day (QID) | ORAL | Status: DC | PRN
  Administered 2022-03-31 – 2022-04-07 (×7): 25 mg via ORAL
  Filled 2022-03-31 (×7): qty 1

## 2022-03-31 MED ORDER — POTASSIUM CHLORIDE 10 MEQ/100ML IV SOLN
10.0000 meq | INTRAVENOUS | Status: AC
  Administered 2022-03-31 (×4): 10 meq via INTRAVENOUS
  Filled 2022-03-31 (×4): qty 100

## 2022-03-31 NOTE — Progress Notes (Signed)
OT Cancellation Note  Patient Details Name: MCKENSEY BERGHUIS MRN: [id number] DOB: Mar 27, 1966   Cancelled Treatment:    Reason Eval/Treat Not Completed: Patient at procedure or test/ unavailable (MRI)  [facility] 03/31/2022, 10:36 AM Luisa Dago, OT/L   Acute OT Clinical Specialist Acute Rehabilitation Services Pager [phone] Office [phone]

## 2022-03-31 NOTE — Progress Notes (Addendum)
Inpatient Diabetes Program Recommendations  AACE/ADA: New Consensus Statement on Inpatient Glycemic Control (2015)  Target Ranges:  Prepandial:   less than 140 mg/dL      Peak postprandial:   less than 180 mg/dL (1-2 hours)      Critically ill patients:  140 - 180 mg/dL   Lab Results  Component Value Date   GLUCAP 262 (H) 03/31/2022   HGBA1C 12.8 (H) 03/31/2022    Review of Glycemic Control  Diabetes history: DM 2 Outpatient Diabetes medications: currently no meds, was on Glipizide 10 mg bid in the past and Trulicity Current orders for Inpatient glycemic control:  Semglee 14 units bid Novolog 0-15 units tid Novolog 4 units tid meal coverage  A1c 12.8% on 5/29  Inpatient Diabetes Program Recommendations:    Spoke with pt at bedside regarding A1c and glucose control at home. Pt reports being on Trulicity injection in the past and she felt like it did not work to lower her glucose levels. Discussed glucose and A1c goals. Discussed importance of glucose control at home.  Pt reports needing all diabetic supplies at time of discharge. Demonstrated the use of insulin pen for pt and discussed the possible need for insulin at time of discharge. The insulin pen has similar operation as her Trulicity pen in the past. Pt has no further questions at this time. Pt has been to [facility] in the past.   Note: pt without insurance. May need 70/30 insulin at time of discharge for cost effectiveness.  D/c needs for Diabetes: Glucose meter kit order #[id number]  Thanks,  Tama Headings RN, MSN, BC-ADM Inpatient Diabetes Coordinator Team Pager [phone] (8a-5p)

## 2022-03-31 NOTE — Progress Notes (Signed)
Spoke to Dr. Roda Shutters. Plavix held for 24h post tpA. If MRI imaging reassuring, will resume plavix given recent PCI.   Laurance Flatten, MD

## 2022-03-31 NOTE — Evaluation (Signed)
Occupational Therapy Evaluation Patient Details Name: [name] MRN: [id number] DOB: [birth year] Today's Date: 03/31/2022   History of Present Illness 56 yo F admitted 5/27 as code STEMI, taken for LHC.  On 5/28 pt had L sided sensory loss. Code stroke activated.  Received tnk. After tnk had oral mucosal bleeding, HA, chest pain, R forearm pain and swelling at site of LHC access. MRI - Small scattered acute infarcts in the cerebral cortex and right thalamus.   Clinical Impression   This 56 yo female admitted with above presents to acute OT with PLOF of being totally independent with all basic ADLs, IADLs, and driving. Currently is she max A +2 for bed mobility, has extreme extensor tone of LLE, decreased use of LUE and tends to keep her eyes shut most of session--all affecting her safety and ability to care for herself at home. She will continue to benefit from acute OT with follow up on AIR to hopefully get her home with intermittent A from family.      Recommendations for follow up therapy are one component of a multi-disciplinary discharge planning process, led by the attending physician.  Recommendations may be updated based on patient status, additional functional criteria and insurance authorization.   Follow Up Recommendations  Acute inpatient rehab (3hours/day)    Assistance Recommended at Discharge Frequent or constant Supervision/Assistance  Patient can return home with the following Two people to help with walking and/or transfers;Two people to help with bathing/dressing/bathroom;Assistance with cooking/housework;Assistance with feeding;Help with stairs or ramp for entrance;Assist for transportation;Direct supervision/assist for financial management;Direct supervision/assist for medications management    Functional Status Assessment  Patient has had a recent decline in their functional status and demonstrates the ability to make significant improvements in function in a  reasonable and predictable amount of time.  Equipment Recommendations  Other (comment) (TBD next venue)    Recommendations for Other Services Rehab consult     Precautions / Restrictions Precautions Precautions: Fall Precaution Comments: right arm painful from IVs, extensor tone in LLE Restrictions Weight Bearing Restrictions: No      Mobility Bed Mobility Overal bed mobility: Needs Assistance Bed Mobility: Rolling, Sidelying to Sit Rolling: Min guard Sidelying to sit: Max assist, +2 for physical assistance, +2 for safety/equipment       General bed mobility comments: VCs for sequencing for rolling, HOB up; Max A +2 to come up to sit from left side (pt did initate RLE off of bed but it got in the way of getting LLE off of bed and she did initiate pushing up through LUE)    Transfers                   General transfer comment: did not attempt due to pt's LLE in close to full extension while seated EOB, gravity and deep pressure applied to quads did help her leg relax a Saia (but unable to "break" extensor tone)          ADL either performed or assessed with clinical judgement   ADL Overall ADL's : Needs assistance/impaired Eating/Feeding: Sitting;Minimal assistance   Grooming: Moderate assistance;Sitting   Upper Body Bathing: Moderate assistance;Sitting   Lower Body Bathing: Total assistance;Bed level   Upper Body Dressing : Maximal assistance;Sitting   Lower Body Dressing: Total assistance;Bed level                       Vision Baseline Vision/History: 0 No visual deficits Ability to See in  Adequate Light: 0 Adequate Additional Comments: Continue to assess--tended to keep eyes closed most of session (headache)         Hand Dominance Right   Extremity/Trunk Assessment Upper Extremity Assessment Upper Extremity Assessment: LUE deficits/detail;RUE deficits/detail RUE Deficits / Details: painful from IVs; LUE Deficits / Details: can  actively raise arm to ~45 degrees shoulder flexion--PROM full. Once shoulder at full flexion she can hold it there and slowly lower arm back down beside her LUE Sensation: decreased light touch LUE Coordination: decreased fine motor;decreased gross motor           Communication Communication Communication: No difficulties   Cognition Arousal/Alertness: Awake/alert Behavior During Therapy: WFL for tasks assessed/performed Overall Cognitive Status: Impaired/Different from baseline Area of Impairment: Following commands, Safety/judgement, Awareness, Problem solving                       Following Commands: Follows one step commands with increased time Safety/Judgement: Decreased awareness of safety, Decreased awareness of deficits Awareness: Intellectual Problem Solving: Decreased initiation, Difficulty sequencing, Requires verbal cues, Requires tactile cues                  Home Living Family/patient expects to be discharged to:: Private residence Living Arrangements: Other relatives (sister who is blind and pt takes care of) Available Help at Discharge: Family;Available PRN/intermittently Type of Home: House Home Access: Level entry Entrance Stairs-Number of Steps: 1   Home Layout: Two level;Bed/bath upstairs Alternate Level Stairs-Number of Steps: 14 Alternate Level Stairs-Rails: Right;Left Bathroom Shower/Tub: Chief Strategy OfficerTub/shower unit   Bathroom Toilet: Standard     Home Equipment: None          Prior Functioning/Environment Prior Level of Function : Independent/Modified Independent                        OT Problem List: Decreased strength;Decreased range of motion;Decreased activity tolerance;Impaired balance (sitting and/or standing);Impaired vision/perception;Decreased coordination;Decreased safety awareness;Decreased knowledge of use of DME or AE;Impaired UE functional use;Impaired tone;Impaired sensation;Pain      OT Treatment/Interventions:  Self-care/ADL training;DME and/or AE instruction;Patient/family education;Balance training;Visual/perceptual remediation/compensation;Neuromuscular education;Therapeutic exercise;Therapeutic activities    OT Goals(Current goals can be found in the care plan section) Acute Rehab OT Goals Patient Stated Goal: to go home by herself OT Goal Formulation: With patient Time For Goal Achievement: 04/14/22 Potential to Achieve Goals: Good  OT Frequency: Min 3X/week    Co-evaluation PT/OT/SLP Co-Evaluation/Treatment: Yes Reason for Co-Treatment: For patient/therapist safety PT goals addressed during session: Mobility/safety with mobility;Balance;Strengthening/ROM OT goals addressed during session: Strengthening/ROM      AM-PAC OT "6 Clicks" Daily Activity     Outcome Measure Help from another person eating meals?: A Gilberto Help from another person taking care of personal grooming?: A Lot Help from another person toileting, which includes using toliet, bedpan, or urinal?: Total Help from another person bathing (including washing, rinsing, drying)?: A Lot Help from another person to put on and taking off regular upper body clothing?: A Lot Help from another person to put on and taking off regular lower body clothing?: Total 6 Click Score: 6   End of Session    Activity Tolerance: Patient limited by pain (headache, RUE with use and movement, mid back when she was sitting EOB) Patient left: in bed;with call bell/phone within reach;with bed alarm set;with family/visitor present  OT Visit Diagnosis: Other abnormalities of gait and mobility (R26.89);Muscle weakness (generalized) (M62.81);Other symptoms and signs involving cognitive function;Hemiplegia and  hemiparesis;Pain Hemiplegia - Right/Left: Left Hemiplegia - dominant/non-dominant: Non-Dominant Hemiplegia - caused by: Cerebral infarction Pain - part of body:  (right arm, headache, mid back)                Time: 7673-4193 OT Time Calculation  (min): 20 min Charges:  OT General Charges $OT Visit: 1 Visit OT Evaluation $OT Eval Moderate Complexity: 1 Mod [name], OTR/L Acute Rehab Services [facility] [phone] Office [phone]    [name] 03/31/2022, 4:57 PM

## 2022-03-31 NOTE — Consult Note (Signed)
NAME:  [name], MRN:  [id number], DOB:  08-14-1966, LOS: 1 ADMISSION DATE:  03/29/2022, CONSULTATION DATE:  03/30/22 REFERRING MD:  Durene Cal - Neuro , CHIEF COMPLAINT:  probable stroke s/p tnk   History of Present Illness:  56 yo F admitted 5/27 as code STEMI, taken for LHC -- PDA 99% in stent restenosis s/p DES.  On 5/28 pt had L sided sensory loss. Code stroke activated at 1332.   Received tnk. After tnk had oral mucosal bleeding, HA, chest pain, R forearm pain and swelling at site of LHC access.   Taken to ICU after imaging for further management.  PCCM was consulted for help with medical management  Pertinent  Medical History    Significant Hospital Events: Including procedures, antibiotic start and stop dates in addition to other pertinent events   5/27 NSTEMI -- [facility]  5/28 Code stroke L sided sensory loss   Interim History / Subjective:  Continues to complain stinging pain in right forearm Complained of severe headache overnight 10/10, stat head CT was done which was negative for acute findings  Objective   Blood pressure 136/86, pulse 63, temperature 98.2 F (36.8 C), temperature source Oral, resp. rate 15, height 5\' 5"  (1.651 m), weight 90.7 kg, last menstrual period 01/24/2015, SpO2 96 %.        Intake/Output Summary (Last 24 hours) at 03/31/2022 0912 Last data filed at 03/31/2022 0900 Gross per 24 hour  Intake 711.41 ml  Output 1650 ml  Net -938.59 ml   Filed Weights   03/29/22 0600  Weight: 90.7 kg    Examination: Physical exam: General: Acutely ill-appearing female, lying on the bed HEENT: [place]/AT, eyes anicteric.  moist mucus membranes Neuro: Alert, awake following commands.  Decreased to absent sensation on left upper and lower extremity, antigravity on all 4 extremities but weaker on left than right Chest: Coarse breath sounds, no wheezes or rhonchi Heart: Regular rate and rhythm, no murmurs or gallops Abdomen: Soft, nontender, nondistended, bowel sounds  present Skin: No rash.  Right forearm is swollen, tender to touch   Resolved Hospital Problem list     Assessment & Plan:  Acute non-ST elevation MI status postcardiac cath with DES Acute stroke with left-sided numbness and weakness s/p TNK Poorly controlled diabetes with hyperglycemia Post TNK right forearm swelling and pain due to arterial bleeding and forearm Hypertension Hyperlipidemia Morbid obesity Hypocalcemia  Patient is status post cardiac cath s/p DES Continue Plavix, intolerant of aspirin Continue Crestor Continue neuro watch every hour Patient continued to have left-sided weakness and numbness CT head was repeated due to headache overnight, showed no acute changes MRI brain in pending Stroke team is following Continue neuro watch every hour Patient hemoglobin A1c is 12.8 Increase Lantus to 14 units twice daily Continue sliding scale insulin with CBG goal 140-180 I think patient will require insulin therapy at discharge as she only takes glipizide at home Continue Coreg Continue to hold lisinopril and HCTZ Hold antihypertensive for now Keep right arm elevated Resume diabetic diet Continue aggressive electrolyte supplement Dietitian follow-up  Best Practice (right click and "Reselect all SmartList Selections" daily)   Diet/type: Consistent carb diet DVT prophylaxis: SCD GI prophylaxis: PPI Lines: N/A Foley:  N/A Code Status:  full code Last date of multidisciplinary goals of care discussion [Per primary team]  Labs   CBC: Recent Labs  Lab 03/29/22 0210 03/29/22 0820 03/30/22 0052 03/31/22 0301  WBC 6.2  --  9.0 7.3  NEUTROABS 3.4  --   --   --  HGB 12.8 13.2 12.0 12.6  HCT 37.7 38.5 34.6* 36.5  MCV 94.5  --  95.6 93.6  PLT 212  --  171 186    Basic Metabolic Panel: Recent Labs  Lab 03/29/22 0210 03/30/22 0052 03/30/22 1750 03/31/22 0301  NA 137 136  --  139  K 3.8 4.1  --  3.5  CL 104 106  --  107  CO2 23 25  --  26  GLUCOSE 394*  379*  --  188*  BUN 16 13  --  7  CREATININE 0.91 0.81  --  0.72  CALCIUM 9.2 8.3*  --  8.8*  MG  --   --  2.0  --   PHOS  --   --  3.6  --    GFR: Estimated Creatinine Clearance: 86.4 mL/min (by C-G formula based on SCr of 0.72 mg/dL). Recent Labs  Lab 03/29/22 0210 03/30/22 0052 03/31/22 0301  WBC 6.2 9.0 7.3    Liver Function Tests: No results for input(s): AST, ALT, ALKPHOS, BILITOT, PROT, ALBUMIN in the last 168 hours. No results for input(s): LIPASE, AMYLASE in the last 168 hours. No results for input(s): AMMONIA in the last 168 hours.  ABG    Component Value Date/Time   HCO3 28.1 (H) 03/06/2016 1612   TCO2 30 03/06/2016 1612   O2SAT 45.0 03/06/2016 1612     Coagulation Profile: No results for input(s): INR, PROTIME in the last 168 hours.  Cardiac Enzymes: No results for input(s): CKTOTAL, CKMB, CKMBINDEX, TROPONINI in the last 168 hours.  HbA1C: HbA1c, POC (controlled diabetic range)  Date/Time Value Ref Range Status  12/06/2020 11:01 AM 11.1 (A) 0.0 - 7.0 % Final  05/21/2020 08:51 AM 9.4 (A) 0.0 - 7.0 % Final   Hgb A1c MFr Bld  Date/Time Value Ref Range Status  03/31/2022 03:01 AM 12.8 (H) 4.8 - 5.6 % Final    Comment:    (NOTE) Pre diabetes:          5.7%-6.4%  Diabetes:              >6.4%  Glycemic control for   <7.0% adults with diabetes   03/29/2022 08:20 AM 12.3 (H) 4.8 - 5.6 % Final    Comment:    (NOTE) Pre diabetes:          5.7%-6.4%  Diabetes:              >6.4%  Glycemic control for   <7.0% adults with diabetes     CBG: Recent Labs  Lab 03/30/22 1337 03/30/22 1946 03/30/22 2331 03/31/22 0329 03/31/22 0527  GLUCAP 334* 263* 206* 178* 171*    Critical care time:     Total critical care time: 32 minutes  Performed by: Cheri FowlerSudham Bonny Vanleeuwen   Critical care time was exclusive of separately billable procedures and treating other patients.   Critical care was necessary to treat or prevent imminent or life-threatening  deterioration.   Critical care was time spent personally by me on the following activities: development of treatment plan with patient and/or surrogate as well as nursing, discussions with consultants, evaluation of patient's response to treatment, examination of patient, obtaining history from patient or surrogate, ordering and performing treatments and interventions, ordering and review of laboratory studies, ordering and review of radiographic studies, pulse oximetry and re-evaluation of patient's condition.   Cheri FowlerSudham Lakeya Mulka MD [facility] See Amion for pager If no response to pager, please call [phone] until 7pm After  7pm, Please call E-link [phone]

## 2022-03-31 NOTE — Progress Notes (Signed)
Progress Note  Patient Name: [name] Date of Encounter: 03/31/2022  [facility] Cardiologist: Donato Heinz, MD   Subjective   Had planned for discharge yesterday, developed left-sided numbness and weakness.  Stroke code called, she was administered tPA and admitted to neuro ICU.  Head CT unremarkable, brain MRI with small scattered acute infarcts. Plavix was held x24 hours following tPA  She denies any chest pain this morning.  Reports persistent left sided weakness.    Inpatient Medications    Scheduled Meds:  carvedilol  25 mg Oral BID   Chlorhexidine Gluconate Cloth  6 each Topical Daily   Chlorhexidine Gluconate Cloth  6 each Topical Q0600   insulin aspart  0-15 Units Subcutaneous Q6H   insulin aspart  4 Units Subcutaneous TID WC   insulin glargine-yfgn  14 Units Subcutaneous BID   mupirocin ointment  1 application. Nasal BID   pantoprazole  40 mg Oral QHS   rosuvastatin  40 mg Oral Daily   senna-docusate  1 tablet Oral BID   sodium chloride flush  3 mL Intravenous Q12H   sodium chloride flush  3 mL Intravenous Q12H   Continuous Infusions:  sodium chloride Stopped (03/30/22 1820)   sodium chloride Stopped (03/30/22 2230)   PRN Meds: sodium chloride, acetaminophen **OR** acetaminophen (TYLENOL) oral liquid 160 mg/5 mL **OR** acetaminophen, albuterol, diphenhydrAMINE **OR** diphenhydrAMINE, labetalol, nitroGLYCERIN, ondansetron (ZOFRAN) IV, sodium chloride flush   Vital Signs    Vitals:   03/31/22 0800 03/31/22 0900 03/31/22 1000 03/31/22 1100  BP: (!) 145/78 136/86 (!) 149/92 (!) 141/88  Pulse: 62 63 83 90  Resp: 17 15 (!) 21 20  Temp: 98.2 F (36.8 C)     TempSrc: Oral     SpO2: 96% 96% 97% 100%  Weight:      Height:        Intake/Output Summary (Last 24 hours) at 03/31/2022 1115 Last data filed at 03/31/2022 1000 Gross per 24 hour  Intake 799.44 ml  Output 1250 ml  Net -450.56 ml      03/29/2022    6:00 AM 01/28/2021    9:25 AM  12/06/2020   10:41 AM  Last 3 Weights  Weight (lbs) 200 lb 204 lb 3.2 oz 208 lb  Weight (kg) 90.719 kg 92.625 kg 94.348 kg      Telemetry    NSR - Personally Reviewed  ECG    EKG today appears limb lead reversal, will repeat - Personally Reviewed  Physical Exam   GEN: No acute distress.   Neck: No JVD Cardiac: RRR, no murmurs, rubs, or gallops.  Respiratory: Clear to auscultation bilaterally. GI: Soft, nontender, non-distended  MS: No edema; No deformity. Right radial access site c/d/i Neuro:  L sided weakness Psych: Normal affect   Labs    High Sensitivity Troponin:   Recent Labs  Lab 03/29/22 0820 03/29/22 1002 03/29/22 1634 03/30/22 0350 03/30/22 0540  TROPONINIHS 87* 134* 302* 184* 162*      Chemistry Recent Labs  Lab 03/29/22 0210 03/30/22 0052 03/30/22 1750 03/31/22 0301  NA 137 136  --  139  K 3.8 4.1  --  3.5  CL 104 106  --  107  CO2 23 25  --  26  GLUCOSE 394* 379*  --  188*  BUN 16 13  --  7  CREATININE 0.91 0.81  --  0.72  CALCIUM 9.2 8.3*  --  8.8*  MG  --   --  2.0  --  GFRNONAA >60 >60  --  >60  ANIONGAP 10 5  --  6     Lipids  Recent Labs  Lab 03/31/22 0301  CHOL 254*  TRIG 194*  HDL 42  LDLCALC 173*  CHOLHDL 6.0     Hematology Recent Labs  Lab 03/29/22 0210 03/29/22 0820 03/30/22 0052 03/31/22 0301  WBC 6.2  --  9.0 7.3  RBC 3.99  --  3.62* 3.90  HGB 12.8 13.2 12.0 12.6  HCT 37.7 38.5 34.6* 36.5  MCV 94.5  --  95.6 93.6  MCH 32.1  --  33.1 32.3  MCHC 34.0  --  34.7 34.5  RDW 11.9  --  12.0 11.9  PLT 212  --  171 186    Thyroid No results for input(s): TSH, FREET4 in the last 168 hours.  BNPNo results for input(s): BNP, PROBNP in the last 168 hours.  DDimer  Recent Labs  Lab 03/29/22 0820  DDIMER 0.29      Radiology    CT HEAD WO CONTRAST (5MM)  Result Date: 03/31/2022 CLINICAL DATA:  Stroke.  Follow-up headache after T NK EXAM: CT HEAD WITHOUT CONTRAST TECHNIQUE: Contiguous axial images were obtained  from the base of the skull through the vertex without intravenous contrast. RADIATION DOSE REDUCTION: This exam was performed according to the departmental dose-optimization program which includes automated exposure control, adjustment of the mA and/or kV according to patient size and/or use of iterative reconstruction technique. COMPARISON:  Yesterday FINDINGS: Brain: No evidence of acute infarction, hemorrhage, hydrocephalus, extra-axial collection or mass lesion/mass effect. Vascular: No hyperdense vessel or unexpected calcification. Skull: Normal. Negative for fracture or focal lesion. Sinuses/Orbits: No acute finding. IMPRESSION: Negative head CT. Electronically Signed   By: Jorje Guild M.D.   On: 03/31/2022 03:55   CT HEAD WO CONTRAST (5MM)  Result Date: 03/30/2022 CLINICAL DATA:  Neuro deficit, acute, stroke suspected s/p cath now with LLE weakness EXAM: CT ANGIOGRAPHY HEAD AND NECK TECHNIQUE: Multidetector CT imaging of the head and neck was performed using the standard protocol during bolus administration of intravenous contrast. Multiplanar CT image reconstructions and MIPs were obtained to evaluate the vascular anatomy. Carotid stenosis measurements (when applicable) are obtained utilizing NASCET criteria, using the distal internal carotid diameter as the denominator. RADIATION DOSE REDUCTION: This exam was performed according to the departmental dose-optimization program which includes automated exposure control, adjustment of the mA and/or kV according to patient size and/or use of iterative reconstruction technique. CONTRAST:  178mL OMNIPAQUE IOHEXOL 350 MG/ML SOLN COMPARISON:  None Available. FINDINGS: CT HEAD FINDINGS Brain: No evidence of acute large vascular territory infarction, hemorrhage, hydrocephalus, extra-axial collection or mass lesion/mass effect. Partially empty sella. Vascular: See below. Skull: No acute fracture. Sinuses: Clear sinuses. Orbits: No acute finding. Review of the  MIP images confirms the above findings CTA NECK FINDINGS Aortic arch: Great vessel origins are patent without significant stenosis. Right carotid system: Atherosclerosis at the carotid bifurcation without greater than 50% stenosis. The common carotid artery is displaced laterally by the enlarged thyroid. Left carotid system: Atherosclerosis at the carotid bifurcation without greater than 50% stenosis. The common carotid artery is displaced laterally by the enlarged thyroid. Vertebral arteries: Left dominant. No significant (greater than 50%) stenosis. Skeleton: No acute findings. Other neck: Enlarged and heterogeneous thyroid. Upper chest: Visualized lung apices are clear. Review of the MIP images confirms the above findings CTA HEAD FINDINGS Anterior circulation: Bilateral intracranial ICAs, MCAs, and ACAs are patent without proximal hemodynamically significant stenosis.  Posterior circulation: Left dominant. Bilateral intradural vertebral arteries, basilar artery and both posterior cerebral arteries are patent without proximal hemodynamically significant stenosis. Small bilateral P1 PCAs with prominent bilateral posterior communicating arteries, anatomic variant. Venous sinuses: No evidence of dural venous sinus thrombosis. Small left transverse sinus. Anatomic variants: Detailed above. Review of the MIP images confirms the above findings IMPRESSION: 1. No evidence of acute intracranial abnormality. 2. No emergent large vessel occlusion or proximal hemodynamically significant stenosis. 3. Partially empty sella and narrowed left transverse sinus. These findings are nonspecific and often incidental anatomic variant, but can be seen with idiopathic intracranial hypertension in the correct clinical setting. 4. Enlarged heterogeneous thyroid. Recommend thyroid ultrasound (ref: J Am Coll Radiol. 2015 Feb;12(2): 143-50). Electronically Signed   By: Margaretha Sheffield M.D.   On: 03/30/2022 14:53   CARDIAC  CATHETERIZATION  Result Date: 03/29/2022   RPDA-2 lesion is 99% stenosed.   RPDA-1 lesion is 40% stenosed.   2nd Diag lesion is 80% stenosed.   Previously placed Mid Cx stent (unknown type) is  widely patent.   A stent was successfully placed.   Post intervention, there is a 0% residual stenosis.   LV end diastolic pressure is normal. 1.  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 2.  High-grade second diagonal lesion which should be treated medically due to proximity to LAD. 3.  LVEDP of 11 mmHg The results were reviewed with Dr. Johney Frame. 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   CT FOREARM RIGHT WO CONTRAST  Result Date: 03/30/2022 CLINICAL DATA:  Forearm trauma status post right radial catheterization. EXAM: CT OF THE RIGHT FOREARM WITHOUT CONTRAST TECHNIQUE: Multidetector CT imaging was performed according to the standard protocol. Multiplanar CT image reconstructions were also generated. RADIATION DOSE REDUCTION: This exam was performed according to the departmental dose-optimization program which includes automated exposure control, adjustment of the mA and/or kV according to patient size and/or use of iterative reconstruction technique. COMPARISON:  None Available. FINDINGS: Bones/Joint/Cartilage No evidence of fracture or dislocation. No evidence of arthropathy. No periosteal reaction. Ligaments Suboptimally assessed by CT. Muscles and Tendons Muscles of the flexor and access stent compartments are within normal limits. Thenar and hypothenar muscles are normal in caliber. Soft tissues Moderate subcutaneous soft tissue edema about the radial aspect of the forearm. No fluid collection or seroma. IMPRESSION: 1. Moderate subcutaneous soft tissue edema about the radial aspect of the forearm without evidence of drainable fluid collection or  hematoma/seroma. 2. Muscles are normal in bulk and density. The tendons of the flexor and extensor compartments are within normal limits. 3.  No acute osseous abnormality. Electronically Signed   By: Keane Police D.O.   On: 03/30/2022 15:57   DG Chest Port 1 View  Result Date: 03/31/2022 CLINICAL DATA:  Stroke Coronary artery disease Hypertension EXAM: PORTABLE CHEST - 1 VIEW COMPARISON:  05/22/2022 FINDINGS: Cardiomediastinal silhouette and pulmonary vasculature are within normal limits. Lungs are clear. IMPRESSION: No acute cardiopulmonary process. Electronically Signed   By: Miachel Roux M.D.   On: 03/31/2022 07:23   ECHOCARDIOGRAM COMPLETE  Result Date: 03/30/2022    ECHOCARDIOGRAM REPORT   Patient Name:   [name] Date of Exam: 03/30/2022 Medical Rec #:  [id number]      Height:       65.0 in Accession #:    [id number]     Weight:  200.0 lb Date of Birth:  04/05/1966     BSA:          1.978 m Patient Age:    20 years       BP:           110/65 mmHg Patient Gender: F              HR:           62 bpm. Exam Location:  Inpatient Procedure: 2D Echo, Cardiac Doppler, Color Doppler and Intracardiac            Opacification Agent Indications:    NSTEMI  History:        Patient has prior history of Echocardiogram examinations, most                 recent 07/02/2020. CAD and Previous Myocardial Infarction,                 Signs/Symptoms:Chest Pain; Risk Factors:Hypertension and                 Diabetes. Sarcoidosis.  Sonographer:    Clayton Lefort RDCS (AE) Referring Phys: [id number] [facility] [place] IMPRESSIONS  1. Cystic liver disease noted.  2. Left ventricular ejection fraction, by estimation, is 60 to 65%. The left ventricle has normal function. The left ventricle has no regional wall motion abnormalities. There is moderate left ventricular hypertrophy. Left ventricular diastolic parameters were normal.  3. Right ventricular systolic function is normal. The right ventricular size is normal.  4. The  mitral valve is normal in structure. No evidence of mitral valve regurgitation. No evidence of mitral stenosis.  5. The aortic valve is tricuspid. There is mild calcification of the aortic valve. Aortic valve regurgitation is mild. Aortic valve sclerosis is present, with no evidence of aortic valve stenosis.  6. The inferior vena cava is normal in size with greater than 50% respiratory variability, suggesting right atrial pressure of 3 mmHg. FINDINGS  Left Ventricle: Left ventricular ejection fraction, by estimation, is 60 to 65%. The left ventricle has normal function. The left ventricle has no regional wall motion abnormalities. Definity contrast agent was given IV to delineate the left ventricular  endocardial borders. The left ventricular internal cavity size was normal in size. There is moderate left ventricular hypertrophy. Left ventricular diastolic parameters were normal. Right Ventricle: The right ventricular size is normal. No increase in right ventricular wall thickness. Right ventricular systolic function is normal. Left Atrium: Left atrial size was normal in size. Right Atrium: Right atrial size was normal in size. Pericardium: There is no evidence of pericardial effusion. Mitral Valve: The mitral valve is normal in structure. No evidence of mitral valve regurgitation. No evidence of mitral valve stenosis. Tricuspid Valve: The tricuspid valve is normal in structure. Tricuspid valve regurgitation is not demonstrated. No evidence of tricuspid stenosis. Aortic Valve: The aortic valve is tricuspid. There is mild calcification of the aortic valve. Aortic valve regurgitation is mild. Aortic valve sclerosis is present, with no evidence of aortic valve stenosis. Pulmonic Valve: The pulmonic valve was normal in structure. Pulmonic valve regurgitation is not visualized. No evidence of pulmonic stenosis. Aorta: The aortic root is normal in size and structure. Venous: The inferior vena cava is normal in size with  greater than 50% respiratory variability, suggesting right atrial pressure of 3 mmHg. IAS/Shunts: No atrial level shunt detected by color flow Doppler. Additional Comments: Cystic liver disease noted.  LEFT VENTRICLE PLAX 2D LVOT diam:  2.10 cm   Diastology LVOT Area:     3.46 cm  LV e' medial:    5.98 cm/s                          LV E/e' medial:  11.2                          LV e' lateral:   17.10 cm/s                          LV E/e' lateral: 3.9  IVC IVC diam: 1.80 cm LEFT ATRIUM              Index        RIGHT ATRIUM           Index LA Vol (A2C):   37.4 ml  18.91 ml/m  RA Area:     14.80 cm LA Vol (A4C):   100.0 ml 50.55 ml/m  RA Volume:   31.30 ml  15.82 ml/m LA Biplane Vol: 67.5 ml  34.12 ml/m   AORTA Ao Root diam: 3.20 cm Ao Asc diam:  0.40 cm MITRAL VALVE MV Area (PHT): 3.03 cm    SHUNTS MV Decel Time: 250 msec    Systemic Diam: 2.10 cm MV E velocity: 66.90 cm/s MV A velocity: 50.50 cm/s MV E/A ratio:  1.32 Jenkins Rouge MD Electronically signed by Jenkins Rouge MD Signature Date/Time: 03/30/2022/11:36:07 AM    Final    CT HEAD CODE STROKE WO CONTRAST  Result Date: 03/30/2022 CLINICAL DATA:  Code stroke.  Neuro deficit, acute, stroke suspected EXAM: CT HEAD WITHOUT CONTRAST TECHNIQUE: Contiguous axial images were obtained from the base of the skull through the vertex without intravenous contrast. RADIATION DOSE REDUCTION: This exam was performed according to the departmental dose-optimization program which includes automated exposure control, adjustment of the mA and/or kV according to patient size and/or use of iterative reconstruction technique. COMPARISON:  None Available. FINDINGS: Brain: No evidence of acute large vascular territory infarction, hemorrhage, hydrocephalus, extra-axial collection or mass lesion/mass effect. Partially empty sella. Vascular: No hyperdense vessel identified Skull: No acute fracture. Sinuses/Orbits: Clear sinuses.  No acute orbital findings. Other: No mastoid  effusions. ASPECTS [facility] Stroke Program Early CT Score) total score (0-10 with 10 being normal): 10. IMPRESSION: 1. No evidence of acute intracranial abnormality.  ASPECTS is 10. 2. Partially empty sella, which is often a normal anatomic variant but can be associated with idiopathic intracranial hypertension. Code stroke imaging results were communicated on 03/30/2022 at 1:51 pm to provider [facility] via secure text paging. Electronically Signed   By: Margaretha Sheffield M.D.   On: 03/30/2022 13:55   CT ANGIO HEAD NECK W WO CM (CODE STROKE)  Result Date: 03/30/2022 CLINICAL DATA:  Neuro deficit, acute, stroke suspected s/p cath now with LLE weakness EXAM: CT ANGIOGRAPHY HEAD AND NECK TECHNIQUE: Multidetector CT imaging of the head and neck was performed using the standard protocol during bolus administration of intravenous contrast. Multiplanar CT image reconstructions and MIPs were obtained to evaluate the vascular anatomy. Carotid stenosis measurements (when applicable) are obtained utilizing NASCET criteria, using the distal internal carotid diameter as the denominator. RADIATION DOSE REDUCTION: This exam was performed according to the departmental dose-optimization program which includes automated exposure control, adjustment of the mA and/or kV according to patient size and/or use of iterative reconstruction technique. CONTRAST:  163mL OMNIPAQUE IOHEXOL 350 MG/ML SOLN COMPARISON:  None Available. FINDINGS: CT HEAD FINDINGS Brain: No evidence of acute large vascular territory infarction, hemorrhage, hydrocephalus, extra-axial collection or mass lesion/mass effect. Partially empty sella. Vascular: See below. Skull: No acute fracture. Sinuses: Clear sinuses. Orbits: No acute finding. Review of the MIP images confirms the above findings CTA NECK FINDINGS Aortic arch: Great vessel origins are patent without significant stenosis. Right carotid system: Atherosclerosis at the carotid bifurcation without greater than  50% stenosis. The common carotid artery is displaced laterally by the enlarged thyroid. Left carotid system: Atherosclerosis at the carotid bifurcation without greater than 50% stenosis. The common carotid artery is displaced laterally by the enlarged thyroid. Vertebral arteries: Left dominant. No significant (greater than 50%) stenosis. Skeleton: No acute findings. Other neck: Enlarged and heterogeneous thyroid. Upper chest: Visualized lung apices are clear. Review of the MIP images confirms the above findings CTA HEAD FINDINGS Anterior circulation: Bilateral intracranial ICAs, MCAs, and ACAs are patent without proximal hemodynamically significant stenosis. Posterior circulation: Left dominant. Bilateral intradural vertebral arteries, basilar artery and both posterior cerebral arteries are patent without proximal hemodynamically significant stenosis. Small bilateral P1 PCAs with prominent bilateral posterior communicating arteries, anatomic variant. Venous sinuses: No evidence of dural venous sinus thrombosis. Small left transverse sinus. Anatomic variants: Detailed above. Review of the MIP images confirms the above findings IMPRESSION: 1. No evidence of acute intracranial abnormality. 2. No emergent large vessel occlusion or proximal hemodynamically significant stenosis. 3. Partially empty sella and narrowed left transverse sinus. These findings are nonspecific and often incidental anatomic variant, but can be seen with idiopathic intracranial hypertension in the correct clinical setting. 4. Enlarged heterogeneous thyroid. Recommend thyroid ultrasound (ref: J Am Coll Radiol. 2015 Feb;12(2): 143-50). Electronically Signed   By: Margaretha Sheffield M.D.   On: 03/30/2022 14:53    Cardiac Studies   LHC 03/29/22:   RPDA-2 lesion is 99% stenosed.   RPDA-1 lesion is 40% stenosed.   2nd Diag lesion is 80% stenosed.   Previously placed Mid Cx stent (unknown type) is  widely patent.   A stent was successfully  placed.   Post intervention, there is a 0% residual stenosis.   LV end diastolic pressure is normal.   1.  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 2.  High-grade second diagonal lesion which should be treated medically due to proximity to LAD. 3.  LVEDP of 11 mmHg   The results were reviewed with Dr. Johney Frame.   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    Patient Profile     56 y.o. female with history of known CAD s/p PCI to Lcx and PDA in 10/2019 at [facility] hospital, HTN, HLD, DMII, and prior history of PE who presented to the ER with chest burning radiating to her back found to have elevated troponin for which Cardiology was consulted.  Assessment & Plan    #Acute CVA Sudden left-sided weakness on 5/28, given tPA and admitted to neuro ICU.  Head CT unremarkable, brain MRI with small scattered acute infarcts.  D/w Dr Erlinda Hong, [place] to restart plavix this afternoon.   -D/w Dr Erlinda Hong and recommend TEE/loop recorder prior to discharge.  After careful review of history and examination, the risks and benefits of transesophageal echocardiogram have been explained including risks of esophageal damage, perforation (1:10,000 risk), bleeding, pharyngeal hematoma as well as other potential complications associated with conscious sedation  including aspiration, arrhythmia, respiratory failure and death. Alternatives to treatment were discussed, questions were answered. Patient is willing to proceed.    #NSTEMI: #CAD: Patient presented with chest burning radiating to her back that had been progressively worsening over the past 3 weeks. Had several ER visits over that time and was treated for GERD with no relief. Symptoms were similar to those with her prior PDA and Lcx stents. We placed her on nitro and heparin, however, symptoms continued and she ultimately  was taken for urgent cath on 5/27 which revealed 99% in-stent restenosis of PDA and 80% D2. The PDA was treated with DES with excellent result. D2 was recommended for medical management given proximity to LAD. Currently, feeling much better and asking to go home. -Cath with 99% in-stent restenosis of PDA now s/p PCI with DES -Echo shows EF 60 to 65%, no significant valvular disease -Unable to tolerate ASA due to severe allergy -Continue plavix 75mg  daily indefinitely.  Plavix held this AM given acute CVA, d/w Dr Erlinda Hong and OK to restart this afternoon as above -Continue coreg 25mg  BID -Add ARB as able as outpatient -Started crestor 20mg  daily -Repeat lipids as outpatient in 6-8 weeks  #HTN: -Continue coreg 25mg  BID -Add ARB as able as outpatient  #HLD: -Crestor 20mg  daily as above -Repeat lipids as outpatient in 6-8 weeks -Goal LDL<55  #DMII: Very poorly controlled. A1C 12.5. Needs close follow-up with endocrinology as an outpatient     For questions or updates, please contact [place] Please consult [email or web address] for contact info under        Signed, Donato Heinz, MD  03/31/2022, 11:15 AM

## 2022-03-31 NOTE — TOC CAGE-AID Note (Signed)
Transition of Care [facility]) - CAGE-AID Screening   Patient Details  Name: [name] MRN: [id number] Date of Birth: 1966/09/13  [facility] of [facility]) CM/SW Contact:    Marchello Rothgeb C Tarpley-Carter, LCSWA Phone Number: 03/31/2022, 1:42 PM   Clinical Narrative: Pt is unable to participate in Cage Aid. Pt is critically ill.  CSW will assess at a better time.  Carless Slatten Tarpley-Carter, MSW, LCSW-A Pronouns:  She/Her/Hers [facility] HealthTransitions of Care Clinical Social Worker Direct Number:  [phone] [email or web address]  CAGE-AID Screening: Substance Abuse Screening unable to be completed due to: : Patient unable to participate

## 2022-03-31 NOTE — Progress Notes (Addendum)
STROKE TEAM PROGRESS NOTE   INTERVAL HISTORY Patient is seen in her room with no family at the bedside.  Yesterday, she developed left sided weakness and numbness and a code stroke was called.  Patient received TNK to treat stroke.  MRI revealed small, scattered infarcts in the right cerebral cortex and thalamus, likely embolic.  She did developed swelling and pain to her right wrist and arm near her radial artery access site.  Ultrasound of this area was ordered.  Vitals:   03/31/22 1000 03/31/22 1100 03/31/22 1200 03/31/22 1300  BP: (!) 149/92 (!) 141/88 (!) 149/65 (!) 146/80  Pulse: 83 90 63 77  Resp: (!) Temp:   98.1 F (36.7 C)   TempSrc:   Oral   SpO2: 97% 100% 99% 97%  Weight:      Height:       CBC:  Recent Labs  Lab 03/29/22 0210 03/29/22 0820 03/30/22 0052 03/31/22 0301  WBC 6.2  --  9.0 7.3  NEUTROABS 3.4  --   --   --   HGB 12.8   < > 12.0 12.6  HCT 37.7   < > 34.6* 36.5  MCV 94.5  --  95.6 93.6  PLT 212  --  171 186   < > = values in this interval not displayed.   Basic Metabolic Panel:  Recent Labs  Lab 03/30/22 0052 03/30/22 1750 03/31/22 0301  NA 136  --  139  K 4.1  --  3.5  CL 106  --  107  CO2 25  --  26  GLUCOSE 379*  --  188*  BUN 13  --  7  CREATININE 0.81  --  0.72  CALCIUM 8.3*  --  8.8*  MG  --  2.0  --   PHOS  --  3.6  --    Lipid Panel:  Recent Labs  Lab 03/31/22 0301  CHOL 254*  TRIG 194*  HDL 42  CHOLHDL 6.0  VLDL 39  LDLCALC 161*   HgbA1c:  Recent Labs  Lab 03/31/22 0301  HGBA1C 12.8*   Urine Drug Screen: No results for input(s): LABOPIA, COCAINSCRNUR, LABBENZ, AMPHETMU, THCU, LABBARB in the last 168 hours.  Alcohol Level No results for input(s): ETH in the last 168 hours.  IMAGING past 24 hours CT HEAD WO CONTRAST ( )  Result Date: 03/31/2022 CLINICAL DATA:  Stroke.  Follow-up headache after T NK EXAM: CT HEAD WITHOUT CONTRAST TECHNIQUE: Contiguous axial images were obtained from the base of the  skull through the vertex without intravenous contrast. RADIATION DOSE REDUCTION: This exam was performed according to the departmental dose-optimization program which includes automated exposure control, adjustment of the mA and/or kV according to patient size and/or use of iterative reconstruction technique. COMPARISON:  Yesterday FINDINGS: Brain: No evidence of acute infarction, hemorrhage, hydrocephalus, extra-axial collection or mass lesion/mass effect. Vascular: No hyperdense vessel or unexpected calcification. Skull: Normal. Negative for fracture or focal lesion. Sinuses/Orbits: No acute finding. IMPRESSION: Negative head CT. Electronically Signed   By: Tiburcio Pea M.D.   On: 03/31/2022 03:55   CT HEAD WO CONTRAST ( )  Result Date: 03/30/2022 CLINICAL DATA:  Neuro deficit, acute, stroke suspected s/p cath now with LLE weakness EXAM: CT ANGIOGRAPHY HEAD AND NECK TECHNIQUE: Multidetector CT imaging of the head and neck was performed using the standard protocol during bolus administration of intravenous contrast. Multiplanar CT image reconstructions and MIPs were obtained to evaluate the vascular anatomy. Carotid  stenosis measurements (when applicable) are obtained utilizing NASCET criteria, using the distal internal carotid diameter as the denominator. RADIATION DOSE REDUCTION: This exam was performed according to the departmental dose-optimization program which includes automated exposure control, adjustment of the mA and/or kV according to patient size and/or use of iterative reconstruction technique. CONTRAST:  OMNIPAQUE IOHEXOL 350 MG/ML SOLN COMPARISON:  None Available. FINDINGS: CT HEAD FINDINGS Brain: No evidence of acute large vascular territory infarction, hemorrhage, hydrocephalus, extra-axial collection or mass lesion/mass effect. Partially empty sella. Vascular: See below. Skull: No acute fracture. Sinuses: Clear sinuses. Orbits: No acute finding. Review of the MIP images confirms the  above findings CTA NECK FINDINGS Aortic arch: Great vessel origins are patent without significant stenosis. Right carotid system: Atherosclerosis at the carotid bifurcation without greater than 50% stenosis. The common carotid artery is displaced laterally by the enlarged thyroid. Left carotid system: Atherosclerosis at the carotid bifurcation without greater than 50% stenosis. The common carotid artery is displaced laterally by the enlarged thyroid. Vertebral arteries: Left dominant. No significant (greater than 50%) stenosis. Skeleton: No acute findings. Other neck: Enlarged and heterogeneous thyroid. Upper chest: Visualized lung apices are clear. Review of the MIP images confirms the above findings CTA HEAD FINDINGS Anterior circulation: Bilateral intracranial ICAs, MCAs, and ACAs are patent without proximal hemodynamically significant stenosis. Posterior circulation: Left dominant. Bilateral intradural vertebral arteries, basilar artery and both posterior cerebral arteries are patent without proximal hemodynamically significant stenosis. Small bilateral P1 PCAs with prominent bilateral posterior communicating arteries, anatomic variant. Venous sinuses: No evidence of dural venous sinus thrombosis. Small left transverse sinus. Anatomic variants: Detailed above. Review of the MIP images confirms the above findings IMPRESSION: 1. No evidence of acute intracranial abnormality. 2. No emergent large vessel occlusion or proximal hemodynamically significant stenosis. 3. Partially empty sella and narrowed left transverse sinus. These findings are nonspecific and often incidental anatomic variant, but can be seen with idiopathic intracranial hypertension in the correct clinical setting. 4. Enlarged heterogeneous thyroid. Recommend thyroid ultrasound (ref: J Am Coll Radiol. 2015 Feb;12(2): 143-50). Electronically Signed   By: Feliberto Harts M.D.   On: 03/30/2022 14:53   MR BRAIN WO CONTRAST  Result Date:  03/31/2022 CLINICAL DATA:  Stroke follow-up scan.  Headache. EXAM: MRI HEAD WITHOUT CONTRAST TECHNIQUE: Multiplanar, multiecho pulse sequences of the brain and surrounding structures were obtained without intravenous contrast. COMPARISON:  Head CT from yesterday FINDINGS: Brain: Subcentimeter acute infarcts in the right temporal, right occipital, and bifrontal cortex. Acute lacunar infarct in the right thalamus. Mild chronic vessel ischemia hemispheric white matter. Brain volume is normal. No hemorrhage, hydrocephalus, or collection. Vascular: Major flow voids are preserved Skull and upper cervical spine: No focal marrow lesion Sinuses/Orbits: No acute finding IMPRESSION: 1. Small scattered acute infarcts in the cerebral cortex and right thalamus, pattern suggesting central embolic disease. 2. Mild chronic small vessel ischemia. Electronically Signed   By: Tiburcio Pea M.D.   On: 03/31/2022 11:33   CT FOREARM RIGHT WO CONTRAST  Result Date: 03/30/2022 CLINICAL DATA:  Forearm trauma status post right radial catheterization. EXAM: CT OF THE RIGHT FOREARM WITHOUT CONTRAST TECHNIQUE: Multidetector CT imaging was performed according to the standard protocol. Multiplanar CT image reconstructions were also generated. RADIATION DOSE REDUCTION: This exam was performed according to the departmental dose-optimization program which includes automated exposure control, adjustment of the mA and/or kV according to patient size and/or use of iterative reconstruction technique. COMPARISON:  None Available. FINDINGS: Bones/Joint/Cartilage No evidence of fracture or dislocation. No  evidence of arthropathy. No periosteal reaction. Ligaments Suboptimally assessed by CT. Muscles and Tendons Muscles of the flexor and access stent compartments are within normal limits. Thenar and hypothenar muscles are normal in caliber. Soft tissues Moderate subcutaneous soft tissue edema about the radial aspect of the forearm. No fluid collection  or seroma. IMPRESSION: 1. Moderate subcutaneous soft tissue edema about the radial aspect of the forearm without evidence of drainable fluid collection or hematoma/seroma. 2. Muscles are normal in bulk and density. The tendons of the flexor and extensor compartments are within normal limits. 3.  No acute osseous abnormality. Electronically Signed   By: Larose Hires D.O.   On: 03/30/2022 15:57   DG Chest Port 1 View  Result Date: 03/31/2022 CLINICAL DATA:  Stroke Coronary artery disease Hypertension EXAM: PORTABLE CHEST - 1 VIEW COMPARISON:  05/22/2022 FINDINGS: Cardiomediastinal silhouette and pulmonary vasculature are within normal limits. Lungs are clear. IMPRESSION: No acute cardiopulmonary process. Electronically Signed   By: Acquanetta Belling M.D.   On: 03/31/2022 07:23   VAS [place] UPPER EXTREMITY ARTERIAL DUPLEX  Result Date: 03/31/2022  UPPER EXTREMITY DUPLEX STUDY Patient Name:  [name]  Date of Exam:   03/31/2022 Medical Rec #: [id number]       Accession #:    [id number] Date of Birth: 09-04-1966      Patient Gender: F Patient Age:   55 years Exam Location:  [facility] Procedure:      VAS [place] UPPER EXTREMITY ARTERIAL DUPLEX Referring Phys: --------------------------------------------------------------------------------  Indications: Pain, swelling, bruising. History:     Patient has a history of catheterization via right radial artery.  Risk Factors: Hypertension, hyperlipidemia, Diabetes, no history of smoking,               prior MI, coronary artery disease, prior CVA. Comparison Study: No prior studies. Performing Technologist: Jean Rosenthal RDMS, RVT  Examination Guidelines: A complete evaluation includes B-mode imaging, spectral Doppler, color Doppler, and power Doppler as needed of all accessible portions of each vessel. Bilateral testing is considered an integral part of a complete examination. Limited examinations for reoccurring indications may be performed as noted.  Right Doppler  Findings: +---------------+----------+---------+--------+--------+ Site           PSV (cm/s)Waveform StenosisComments +---------------+----------+---------+--------+--------+ Subclavian Prox131       triphasic                 +---------------+----------+---------+--------+--------+ Subclavian Mid 102       triphasic                 +---------------+----------+---------+--------+--------+ Subclavian Dist121       triphasic                 +---------------+----------+---------+--------+--------+ Axillary       137       triphasic                 +---------------+----------+---------+--------+--------+ Brachial Prox  116       triphasic                 +---------------+----------+---------+--------+--------+ Brachial Mid   112       triphasic                 +---------------+----------+---------+--------+--------+ Brachial Dist  116       triphasic                 +---------------+----------+---------+--------+--------+ Radial Prox    68        triphasic                 +---------------+----------+---------+--------+--------+  Radial Mid     76        triphasic                 +---------------+----------+---------+--------+--------+ Radial Dist    49        triphasic                 +---------------+----------+---------+--------+--------+ Ulnar Prox     47        triphasic                 +---------------+----------+---------+--------+--------+ Ulnar Mid      77        triphasic                 +---------------+----------+---------+--------+--------+ Ulnar Dist     48        triphasic                 +---------------+----------+---------+--------+--------+ Palmar Arch    62        triphasic                 +---------------+----------+---------+--------+--------+     Summary:  Right: No obstruction visualized in the right upper extremity. *See table(s) above for measurements and observations. Electronically signed by Lemar LivingsBrandon Cain MD  on 03/31/2022 at 12:57:32 PM.    Final    CT HEAD CODE STROKE WO CONTRAST  Result Date: 03/30/2022 CLINICAL DATA:  Code stroke.  Neuro deficit, acute, stroke suspected EXAM: CT HEAD WITHOUT CONTRAST TECHNIQUE: Contiguous axial images were obtained from the base of the skull through the vertex without intravenous contrast. RADIATION DOSE REDUCTION: This exam was performed according to the departmental dose-optimization program which includes automated exposure control, adjustment of the mA and/or kV according to patient size and/or use of iterative reconstruction technique. COMPARISON:  None Available. FINDINGS: Brain: No evidence of acute large vascular territory infarction, hemorrhage, hydrocephalus, extra-axial collection or mass lesion/mass effect. Partially empty sella. Vascular: No hyperdense vessel identified Skull: No acute fracture. Sinuses/Orbits: Clear sinuses.  No acute orbital findings. Other: No mastoid effusions. ASPECTS [facility]([place] Stroke Program Early CT Score) total score (0-10 with 10 being normal): 10. IMPRESSION: 1. No evidence of acute intracranial abnormality.  ASPECTS is 10. 2. Partially empty sella, which is often a normal anatomic variant but can be associated with idiopathic intracranial hypertension. Code stroke imaging results were communicated on 03/30/2022 at 1:51 pm to provider [facility] via secure text paging. Electronically Signed   By: Feliberto HartsFrederick S Jones M.D.   On: 03/30/2022 13:55   VAS US UPPER EXTREMITY VENOUS DUPLEX  Result Date: 03/31/2022 UPPER VENOUS STUDY  Patient Name:  [name]  Date of Exam:   03/31/2022 Medical Rec #: [id number]       Accession #:    [id number] Date of Birth: Jul 22, 1966      Patient Gender: F Patient Age:   [age] years [facility] Procedure:      VAS US UPPER EXTREMITY VENOUS DUPLEX Referring Phys: Scheryl MartenJINDONG Gentle Hoge --------------------------------------------------------------------------------  Indications: Swelling, pain, bruising  Other Indications: S/P radial catheterization. Comparison Study: No prior studies. Performing Technologist: Jean Rosenthalachel Hodge RDMS, RVT  Examination Guidelines: A complete evaluation includes B-mode imaging, spectral Doppler, color Doppler, and power Doppler as needed of all accessible portions of each vessel. Bilateral testing is considered an integral part of a complete examination. Limited examinations for reoccurring indications may be performed as noted.  Right Findings: +----------+------------+---------+-----------+----------+-------+ RIGHT     CompressiblePhasicitySpontaneousPropertiesSummary +----------+------------+---------+-----------+----------+-------+ IJV  Full       Yes       Yes                      +----------+------------+---------+-----------+----------+-------+ Subclavian               Yes       Yes                      +----------+------------+---------+-----------+----------+-------+ Axillary      Full       Yes       Yes                      +----------+------------+---------+-----------+----------+-------+ Brachial      Full                                          +----------+------------+---------+-----------+----------+-------+ Radial        Full                                          +----------+------------+---------+-----------+----------+-------+ Ulnar         Full                                          +----------+------------+---------+-----------+----------+-------+ Cephalic      Full                                          +----------+------------+---------+-----------+----------+-------+ Basilic       Full                                          +----------+------------+---------+-----------+----------+-------+  Left Findings: +----------+------------+---------+-----------+----------+-------+ LEFT      CompressiblePhasicitySpontaneousPropertiesSummary  +----------+------------+---------+-----------+----------+-------+ Subclavian               Yes       Yes                      +----------+------------+---------+-----------+----------+-------+  Summary:  Right: No evidence of deep vein thrombosis in the upper extremity. No evidence of superficial vein thrombosis in the upper extremity.  Left: No evidence of thrombosis in the subclavian.  *See table(s) above for measurements and observations.  Diagnosing physician: Lemar Livings MD Electronically signed by Lemar Livings MD on 03/31/2022 at 12:57:24 PM.    Final    CT ANGIO HEAD NECK W WO CM (CODE STROKE)  Result Date: 03/30/2022 CLINICAL DATA:  Neuro deficit, acute, stroke suspected s/p cath now with LLE weakness EXAM: CT ANGIOGRAPHY HEAD AND NECK TECHNIQUE: Multidetector CT imaging of the head and neck was performed using the standard protocol during bolus administration of intravenous contrast. Multiplanar CT image reconstructions and MIPs were obtained to evaluate the vascular anatomy. Carotid stenosis measurements (when applicable) are obtained utilizing NASCET criteria, using the distal internal carotid diameter as the denominator. RADIATION DOSE REDUCTION: This exam was performed according to the departmental dose-optimization program which includes  automated exposure control, adjustment of the mA and/or kV according to patient size and/or use of iterative reconstruction technique. CONTRAST:  OMNIPAQUE IOHEXOL 350 MG/ML SOLN COMPARISON:  None Available. FINDINGS: CT HEAD FINDINGS Brain: No evidence of acute large vascular territory infarction, hemorrhage, hydrocephalus, extra-axial collection or mass lesion/mass effect. Partially empty sella. Vascular: See below. Skull: No acute fracture. Sinuses: Clear sinuses. Orbits: No acute finding. Review of the MIP images confirms the above findings CTA NECK FINDINGS Aortic arch: Great vessel origins are patent without significant stenosis. Right carotid  system: Atherosclerosis at the carotid bifurcation without greater than 50% stenosis. The common carotid artery is displaced laterally by the enlarged thyroid. Left carotid system: Atherosclerosis at the carotid bifurcation without greater than 50% stenosis. The common carotid artery is displaced laterally by the enlarged thyroid. Vertebral arteries: Left dominant. No significant (greater than 50%) stenosis. Skeleton: No acute findings. Other neck: Enlarged and heterogeneous thyroid. Upper chest: Visualized lung apices are clear. Review of the MIP images confirms the above findings CTA HEAD FINDINGS Anterior circulation: Bilateral intracranial ICAs, MCAs, and ACAs are patent without proximal hemodynamically significant stenosis. Posterior circulation: Left dominant. Bilateral intradural vertebral arteries, basilar artery and both posterior cerebral arteries are patent without proximal hemodynamically significant stenosis. Small bilateral P1 PCAs with prominent bilateral posterior communicating arteries, anatomic variant. Venous sinuses: No evidence of dural venous sinus thrombosis. Small left transverse sinus. Anatomic variants: Detailed above. Review of the MIP images confirms the above findings IMPRESSION: 1. No evidence of acute intracranial abnormality. 2. No emergent large vessel occlusion or proximal hemodynamically significant stenosis. 3. Partially empty sella and narrowed left transverse sinus. These findings are nonspecific and often incidental anatomic variant, but can be seen with idiopathic intracranial hypertension in the correct clinical setting. 4. Enlarged heterogeneous thyroid. Recommend thyroid ultrasound (ref: J Am Coll Radiol. 2015 Feb;12(2): 143-50). Electronically Signed   By: Feliberto Harts M.D.   On: 03/30/2022 14:53    PHYSICAL EXAM General:  Alert, well-nourished, well-developed patient in no acute distress Respiratory:  Regular, unlabored respirations on room air  NEURO:  Mental  Status: AA&Ox3  Speech/Language: speech is without dysarthria or aphasia.  Repetition, fluency, and comprehension intact.  Cranial Nerves:  II: PERRL. Visual fields full.  III, IV, VI: EOMI. Eyelids elevate symmetrically.  V: Sensation is diminished on left side VII: Smile is symmetrical.   VIII: hearing intact to voice. IX, X: Phonation is normal.  XII: tongue is midline without fasciculations. Motor: 5/5 strength to RUE and RLE, 4/5 to LUE and 3/5 to LLE Tone: is normal and bulk is normal Sensation- Diminished on left side Coordination: FTN with ataxia on the left. Gait- deferred   ASSESSMENT/PLAN [name] is a 56 y.o. female with history of DM, CAD, NSTEMI and recent PCI presenting with left sided weakness and numbness.  Patient received TNK to treat stroke.  MRI revealed small, scattered infarcts in the right cerebral cortex and thalamus, likely embolic.  She did developed swelling and pain to her right wrist and arm near her radial artery access site.  Ultrasound of this area was ordered.  Stroke: Bilateral scattered infarct s/p TNK likely secondary to recent PCI and embolic source, ? Occult afib Code Stroke CT head No acute abnormality. ASPECTS 10.    CTA head & neck no LVO or hemodynamically significant stenosis MRI  scattered small infarcts in bilateral cerebral cortex and right thalamus 2D Echo EF 60-65%, no atrial level shunt, cystic liver disease noted  Discussed with Dr. Bjorn Pippin cardiology, will consider TEE and loop recorder LDL 173 HgbA1c 12.8 VTE prophylaxis - SCDs clopidogrel 75 mg daily prior to admission, now on clopidogrel 75 mg daily no ASA due to allergy Therapy recommendations:  CIR Disposition:  pending  CAD NSTEMI/recent PCI Patient underwent PCI on 5/27 Bruising and swelling noted to right arm near access site Upper extremity US shows no obstruction or clot Continue Plavix  Hypertension Home meds:  carvedilol 25 mg BID, lisinopril-HCTZ  20-25 mg daily Stable On Coreg 25 twice daily Keep BP <180/105 Long-term BP goal normotensive  Hyperlipidemia Home meds:  none LDL 173, goal < 70 Add rosuvastatin 40 mg daily  Continue statin at discharge  Diabetes type II Uncontrolled Home meds:  none HgbA1c 12.8, goal < 7.0 CBGs Diabetes coordinator consult Insulin glargine 14 units BID Insulin aspart 4 units TID with meals SSI Close PCP follow-up for better DM control  Other Stroke Risk Factors Obesity, Body mass index is 33.28 kg/m., BMI >/= 30 associated with increased stroke risk, recommend weight loss, diet and exercise as appropriate   Other Active Problems Enlarged heterogenous thyroid Thyroid US as outpatient  Hospital day # 1  [name] , MSN, AGACNP-BC [facility] See Amion for schedule and pager information 03/31/2022 1:48 PM  ATTENDING NOTE: I reviewed above note and agree with the assessment and plan. Pt was seen and examined.   56 year old female with history of CAD, diabetes, hypertension, status post recent cardiac cath and stenting had a code stroke yesterday for left sided numbness and weakness.  CT no acute finding.  CT head and neck no LVO.  Status post TNK.  Repeat CT negative for post TNK headache.  MRI showed bilateral scattered small infarcts, embolic pattern.  EF 60 to 65%.  LDL 173, A1c 12.8.  Creatinine 0.72.  On exam, patient still has left hemiparesthesia, hemiparesis, and ataxic on left finger-to-nose.  Patient also has swollen right arm, CT right arm, venous and arterial ultrasound of right arm all negative.    Etiology for patient stroke likely due to unclear embolic source,?  Occult A-fib.  Also recent cardiac cath can be part of the reason.  Resume Plavix after 24 hours of TNK for cardiac stenting.  Added Crestor 40 for HLD.  Cardiology on board, will consider TEE and loop recorder.  Aggressive risk factor modification.  PT/OT recommend CIR.  For detailed  assessment and plan, please refer to above as I have made changes wherever appropriate.   Marvel Plan, MD PhD Stroke Neurology 03/31/2022 7:14 PM  This patient is critically ill due to stroke, recent cardiac stent placement, status post TNK and at significant risk of neurological worsening, death form recurrent stroke, bleeding from TNK, MI. This patient's care requires constant monitoring of vital signs, hemodynamics, respiratory and cardiac monitoring, review of multiple databases, neurological assessment, discussion with family, other specialists and medical decision making of high complexity. I spent 40 minutes of neurocritical care time in the care of this patient. I had long discussion with patient and family at bedside, updated pt current condition, treatment plan and potential prognosis, and answered all the questions.  They expressed understanding and appreciation.  I also discussed with cardiology Dr. Bjorn Pippin     To contact Stroke Continuity provider, please refer to [email or web address]. After hours, contact General Neurology

## 2022-03-31 NOTE — Evaluation (Signed)
Physical Therapy Evaluation Patient Details Name: [name] MRN: [id number] DOB: 02-Jan-1966 Today's Date: 03/31/2022  History of Present Illness  56 yo F admitted 5/27 as code STEMI, taken for LHC.  On 5/28 pt had L sided sensory loss. Code stroke activated.  Received tnk. After tnk had oral mucosal bleeding, HA, chest pain, R forearm pain and swelling at site of LHC access. MRI - Small scattered acute infarcts in the cerebral cortex and right thalamus.  Clinical Impression  Patient presents with decreased mobility due to deficits noted in PT problem list.  Session limited as pt in process of moving out of ICU.  Noted limitations in balance, L side motor control, sensation and activity tolerance.  She will benefit from skilled PT in the acute setting to allow maximized mobility prior to d/c.  Likely to benefit from acute inpatient rehab prior to d/c home as well.        Recommendations for follow up therapy are one component of a multi-disciplinary discharge planning process, led by the attending physician.  Recommendations may be updated based on patient status, additional functional criteria and insurance authorization.  Follow Up Recommendations Acute inpatient rehab (3hours/day)    Assistance Recommended at Discharge Frequent or constant Supervision/Assistance  Patient can return home with the following  A lot of help with bathing/dressing/bathroom;Two people to help with walking and/or transfers;Assistance with cooking/housework;Assist for transportation;Help with stairs or ramp for entrance    Equipment Recommendations Other (comment) (TBA)  Recommendations for Other Services  Rehab consult    Functional Status Assessment Patient has had a recent decline in their functional status and demonstrates the ability to make significant improvements in function in a reasonable and predictable amount of time.     Precautions / Restrictions Precautions Precautions: Fall Precaution  Comments: right arm painful from IVs, extensor tone in LLE Restrictions Weight Bearing Restrictions: No      Mobility  Bed Mobility Overal bed mobility: Needs Assistance Bed Mobility: Rolling, Sidelying to Sit, Sit to Supine Rolling: Min guard Sidelying to sit: Max assist, +2 for physical assistance, +2 for safety/equipment   Sit to supine: Max assist, +2 for physical assistance   General bed mobility comments: VCs for sequencing for rolling, HOB up; Max A +2 to come up to sit from left side (pt did initate RLE off of bed but it got in the way of getting LLE off of bed and she did initiate pushing up through LUE)    Transfers                   General transfer comment: did not attempt due to pt's LLE in close to full extension while seated EOB, gravity and deep pressure applied to quads did help her leg relax a Straughter (but unable to "break" extensor tone)    Ambulation/Gait                  Stairs            Wheelchair Mobility    Modified Rankin (Stroke Patients Only) Modified Rankin (Stroke Patients Only) Pre-Morbid Rankin Score: No symptoms Modified Rankin: Severe disability     Balance Overall balance assessment: Needs assistance   Sitting balance-Leahy Scale: Fair Sitting balance - Comments: seated EOB for about 5 minutes trying to get R knee to bend pt seated with S  Pertinent Vitals/Pain Pain Assessment Pain Assessment: 0-10 Pain Score: 8  Pain Location: R arm Pain Descriptors / Indicators: Sore Pain Intervention(s): Monitored during session    Home Living Family/patient expects to be discharged to:: Private residence Living Arrangements: Other relatives (sister who is blind and pt takes care of) Available Help at Discharge: Family;Available PRN/intermittently Type of Home: House Home Access: Level entry   Entrance Stairs-Number of Steps: 1 Alternate Level Stairs-Number of Steps:  14 Home Layout: Two level;Bed/bath upstairs Home Equipment: None      Prior Function Prior Level of Function : Independent/Modified Independent                     Hand Dominance   Dominant Hand: Right    Extremity/Trunk Assessment   Upper Extremity Assessment Upper Extremity Assessment: Defer to OT evaluation RUE Deficits / Details: painful from IVs; LUE Deficits / Details: can actively raise arm to ~45 degrees shoulder flexion--PROM full. Once shoulder at full flexion she can hold it there and slowly lower arm back down beside her LUE Sensation: decreased light touch LUE Coordination: decreased fine motor;decreased gross motor    Lower Extremity Assessment Lower Extremity Assessment: RLE deficits/detail;LLE deficits/detail RLE Deficits / Details: AROM/strength WFL LLE Deficits / Details: PROM hip flexion WFL, ankle DF limited and stiff with no active movement at ankle; knee stuck in extension, able to flex seated EOB with deep manual pressure over time into quads to approx 20 degrees. LLE Sensation: decreased light touch (absent to light touch and pain)       Communication   Communication: No difficulties  Cognition Arousal/Alertness: Awake/alert Behavior During Therapy: WFL for tasks assessed/performed Overall Cognitive Status: Impaired/Different from baseline Area of Impairment: Following commands, Safety/judgement, Awareness, Problem solving                       Following Commands: Follows one step commands with increased time Safety/Judgement: Decreased awareness of safety, Decreased awareness of deficits Awareness: Intellectual Problem Solving: Decreased initiation, Difficulty sequencing, Requires verbal cues, Requires tactile cues          General Comments General comments (skin integrity, edema, etc.): daughter in the room, VSS throughout    Exercises     Assessment/Plan    PT Assessment Patient needs continued PT services  PT Problem  List Decreased strength;Decreased mobility;Decreased balance;Pain;Decreased safety awareness;Decreased range of motion;Decreased coordination;Decreased activity tolerance       PT Treatment Interventions DME instruction;Therapeutic activities;Patient/family education;Therapeutic exercise;Gait training;Balance training;Functional mobility training;Neuromuscular re-education    PT Goals (Current goals can be found in the Care Plan section)  Acute Rehab PT Goals Patient Stated Goal: to go back to her home PT Goal Formulation: With patient Time For Goal Achievement: 04/14/22 Potential to Achieve Goals: Good    Frequency Min 4X/week     Co-evaluation PT/OT/SLP Co-Evaluation/Treatment: Yes Reason for Co-Treatment: For patient/therapist safety PT goals addressed during session: Mobility/safety with mobility;Balance;Strengthening/ROM OT goals addressed during session: Strengthening/ROM       AM-PAC PT "6 Clicks" Mobility  Outcome Measure Help needed turning from your back to your side while in a flat bed without using bedrails?: A Lot Help needed moving from lying on your back to sitting on the side of a flat bed without using bedrails?: Total Help needed moving to and from a bed to a chair (including a wheelchair)?: Total Help needed standing up from a chair using your arms (e.g., wheelchair or bedside chair)?: Total Help needed  to walk in hospital room?: Total Help needed climbing 3-5 steps with a railing? : Total 6 Click Score: 7    End of Session   Activity Tolerance: Patient tolerated treatment well Patient left: in bed;with call bell/phone within reach;with family/visitor present   PT Visit Diagnosis: Other abnormalities of gait and mobility (R26.89);Other symptoms and signs involving the nervous system (R29.898);Hemiplegia and hemiparesis Hemiplegia - Right/Left: Right Hemiplegia - dominant/non-dominant: Dominant Hemiplegia - caused by: Cerebral infarction    Time:  [id number] PT Time Calculation (min) (ACUTE ONLY): 20 min   Charges:   PT Evaluation $PT Eval Moderate Complexity: 1 Mod          Cyndi Jia Mohamed, PT Acute Rehabilitation Services Pager:[phone] Office:[phone] 03/31/2022   Elray McgregorCynthia Shahir Karen 03/31/2022, 5:50 PM
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
## 2022-03-31 NOTE — Progress Notes (Signed)
Patient complaining of new onset 10/10 headache. Neuro contacted. Stat head CT ordered.

## 2022-03-31 NOTE — Progress Notes (Signed)
OT Cancellation Note  Patient Details Name: [name] MRN: [id number] DOB: 1966-01-01   Cancelled Treatment:    Reason Eval/Treat Not Completed: Other (comment) (Pt eatingt lunch and sking for OT to return later time. Will attempt later as schedule allows.)  [facility] 03/31/2022, 2:37 PM Luisa Dago, OT/L   Acute OT Clinical Specialist Acute Rehabilitation Services Pager [phone] Office [phone]

## 2022-03-31 NOTE — Progress Notes (Signed)
[facility] ADULT ICU REPLACEMENT PROTOCOL   The patient does apply for the [facility] Adult ICU Electrolyte Replacment Protocol based on the criteria listed below:   1.Exclusion criteria: TCTS patients, ECMO patients, and Dialysis patients 2. Is GFR >/= 30 ml/min? Yes.    Patient's GFR today is >60 3. Is SCr </= 2? Yes.   Patient's SCr is 0.72 mg/dL 4. Did SCr increase >/= 0.5 in 24 hours? No. 5.Pt's weight >40kg  Yes.   6. Abnormal electrolyte(s): K  7. Electrolytes replaced per protocol 8.  Call MD STAT for K+ </= 2.5, Phos </= 1, or Mag </= 1 Physician:  Azucena Freed [facility] 03/31/2022 4:59 AM

## 2022-03-31 NOTE — Progress Notes (Signed)
Patient ID: [name], female   DOB: Mar 03, 1966, 56 y.o.   MRN: [id number]  STAT head CT w/o ICH on my read, will follow up radiology read  Patient NPO 10/10 headache pain persists Patient declines rectal tylenol 12.5 mcg fentanyl x 1 dose

## 2022-03-31 NOTE — Progress Notes (Signed)
Right upper extremity venous and arterial studies completed.   Please see CV Proc for preliminary results.   Jean Rosenthal, RDMS, RVT

## 2022-03-31 NOTE — TOC Initial Note (Addendum)
Transition of Care [facility]) - Initial/Assessment Note    Patient Details  Name: [name] MRN: [id number] Date of Birth: June 26, 1966  Transition of Care [facility]) CM/SW Contact:    Harriet Masson, RN Phone Number: 03/31/2022, 2:09 PM  Clinical Narrative:                  Spoke to patient regarding transition needs. Patient lives alone and take bus to appointments. Patient's PCP is at [facility]. Patient doesn't have insurance. Patient requested RNCM send information to [facility] to review for medicaid. Email sent to Christia Reading. Patient states she has trouble affording prescriptions and will need MATCH letter at discharge and copays waived. RNCM recommended to patient to get refills at [facility]. This RNCM requested CMA to make a PCP apt sooner then the one already scheduled for 6/22/2/3. Patient can take bus to follow up appointments. Patient may need transportation home at discharge. TOC will continue to follow for needs.  Expected Discharge Plan: Home/Self Care Barriers to Discharge: Continued Medical Work up   Patient Goals and CMS Choice Patient states their goals for this hospitalization and ongoing recovery are:: return home      Expected Discharge Plan and Services Expected Discharge Plan: Home/Self Care In-house Referral: Financial Counselor Discharge Planning Services: CM Consult, MATCH Program   Living arrangements for the past 2 months: Apartment                                      Prior Living Arrangements/Services Living arrangements for the past 2 months: Apartment Lives with:: Self Patient language and need for interpreter reviewed:: Yes Do you feel safe going back to the place where you live?: Yes            Criminal Activity/Legal Involvement Pertinent to Current Situation/Hospitalization: No - Comment as needed  Activities of Daily Living      Permission Sought/Granted                  Emotional Assessment   Attitude/Demeanor/Rapport:  Engaged Affect (typically observed): Accepting Orientation: : Oriented to Self, Oriented to Place, Oriented to  Time, Oriented to Situation   Psych Involvement: No (comment)  Admission diagnosis:  Non-STEMI (non-ST elevated myocardial infarction) (HCC) [I21.4] Chest pain [R07.9] Stroke (cerebrum) [facility]) [I63.9] Patient Active Problem List   Diagnosis Date Noted   Chest pain 03/30/2022   Cystic disease of liver 03/30/2022   Left sided numbness 03/30/2022   Stroke (cerebrum) ([facility]CC) 03/30/2022   NSTEMI (non-ST elevated myocardial infarction) ([facility]) 03/29/2022   Hematemesis 03/29/2022   CAD S/P percutaneous coronary angioplasty 03/29/2022   Hyperlipidemia 03/29/2022   Obesity (BMI 30-39.9) 03/29/2022   Cervical radicular pain 04/01/2016   Uncontrolled type 2 diabetes mellitus with hyperglycemia, without long-term current use of insulin (HCC) 03/14/2016   Excessive or frequent menstruation 08/29/2013   Symptomatic menopausal or female climacteric states 08/29/2013   Essential hypertension, benign 08/29/2013   POLYNEUROPATHY OTHER DISEASES CLASSIFIED ELSW 10/13/2007   PCP:  Hoy Register, MD Pharmacy:   [id number] - [place], [place] - [address] [place] [address] Phone: [phone] Fax: [phone]  [id number] [place], [place] - [address] AT [facility] OF [address] [place] [address] Phone: [phone] Fax: [phone]     Social Determinants of Health (SDOH) Interventions  Readmission Risk Interventions     View : No data to display.

## 2022-04-01 ENCOUNTER — Other Ambulatory Visit (HOSPITAL_COMMUNITY): Payer: Medicaid Other

## 2022-04-01 ENCOUNTER — Other Ambulatory Visit (HOSPITAL_COMMUNITY): Payer: Self-pay

## 2022-04-01 ENCOUNTER — Encounter (HOSPITAL_COMMUNITY): Payer: Self-pay | Admitting: Internal Medicine

## 2022-04-01 DIAGNOSIS — E782 Mixed hyperlipidemia: Secondary | ICD-10-CM

## 2022-04-01 DIAGNOSIS — I6340 Cerebral infarction due to embolism of unspecified cerebral artery: Secondary | ICD-10-CM

## 2022-04-01 LAB — BASIC METABOLIC PANEL
Anion gap: 7 (ref 5–15)
BUN: 20 mg/dL (ref 6–20)
CO2: 25 mmol/L (ref 22–32)
Calcium: 8.9 mg/dL (ref 8.9–10.3)
Chloride: 104 mmol/L (ref 98–111)
Creatinine, Ser: 0.9 mg/dL (ref 0.44–1.00)
GFR, Estimated: >60 mL/min (ref >60)
Glucose, Bld: 263 mg/dL — ABNORMAL HIGH (ref 70–99)
Potassium: 4 mmol/L (ref 3.5–5.1)
Sodium: 136 mmol/L (ref 135–145)

## 2022-04-01 LAB — GLUCOSE, CAPILLARY
Glucose-Capillary: 269 mg/dL — ABNORMAL HIGH (ref 70–99)
Glucose-Capillary: 225 mg/dL — ABNORMAL HIGH (ref 70–99)
Glucose-Capillary: 263 mg/dL — ABNORMAL HIGH (ref 70–99)
Glucose-Capillary: 135 mg/dL — ABNORMAL HIGH (ref 70–99)

## 2022-04-01 LAB — CBC
HCT: 34.2 % — ABNORMAL LOW (ref 36.0–46.0)
Hemoglobin: 11.9 g/dL — ABNORMAL LOW (ref 12.0–15.0)
MCH: 32.7 pg (ref 26.0–34.0)
MCHC: 34.8 g/dL (ref 30.0–36.0)
MCV: 94 fL (ref 80.0–100.0)
Platelets: 185 10*3/uL (ref 150–400)
RBC: 3.64 MIL/uL — ABNORMAL LOW (ref 3.87–5.11)
RDW: 12 % (ref 11.5–15.5)
WBC: 7.6 10*3/uL (ref 4.0–10.5)
nRBC: 0 % (ref 0.0–0.2)

## 2022-04-01 MED ORDER — INSULIN ASPART 100 UNIT/ML IJ SOLN
6.0000 [IU] | Freq: Three times a day (TID) | INTRAMUSCULAR | Status: DC
Start: 2022-04-01 — End: 2022-04-08
  Administered 2022-04-01 – 2022-04-08 (×18): 6 [IU] via SUBCUTANEOUS

## 2022-04-01 MED ORDER — INSULIN ASPART 100 UNIT/ML IJ SOLN
0.0000 [IU] | Freq: Every day | INTRAMUSCULAR | Status: DC
  Administered 2022-04-03 – 2022-04-06 (×2): 2 [IU] via SUBCUTANEOUS

## 2022-04-01 MED ORDER — LOSARTAN POTASSIUM 25 MG PO TABS
12.5000 mg | ORAL_TABLET | Freq: Every day | ORAL | Status: DC
  Administered 2022-04-01 – 2022-04-07 (×7): 12.5 mg via ORAL
  Filled 2022-04-01 (×7): qty 1

## 2022-04-01 MED ORDER — INSULIN ASPART 100 UNIT/ML IJ SOLN
0.0000 [IU] | Freq: Three times a day (TID) | INTRAMUSCULAR | Status: DC
  Administered 2022-04-01: 7 [IU] via SUBCUTANEOUS
  Administered 2022-04-01 (×2): 11 [IU] via SUBCUTANEOUS
  Administered 2022-04-02: 4 [IU] via SUBCUTANEOUS
  Administered 2022-04-02 – 2022-04-03 (×2): 7 [IU] via SUBCUTANEOUS
  Administered 2022-04-03: 4 [IU] via SUBCUTANEOUS
  Administered 2022-04-03: 3 [IU] via SUBCUTANEOUS
  Administered 2022-04-04 (×2): 4 [IU] via SUBCUTANEOUS
  Administered 2022-04-04: 7 [IU] via SUBCUTANEOUS
  Administered 2022-04-05: 3 [IU] via SUBCUTANEOUS
  Administered 2022-04-05: 4 [IU] via SUBCUTANEOUS
  Administered 2022-04-06: 7 [IU] via SUBCUTANEOUS
  Administered 2022-04-06 – 2022-04-07 (×4): 3 [IU] via SUBCUTANEOUS
  Administered 2022-04-07: 7 [IU] via SUBCUTANEOUS
  Administered 2022-04-08 (×2): 4 [IU] via SUBCUTANEOUS

## 2022-04-01 MED ORDER — EMPAGLIFLOZIN 10 MG PO TABS
10.0000 mg | ORAL_TABLET | Freq: Every day | ORAL | Status: DC
  Administered 2022-04-01 – 2022-04-08 (×8): 10 mg via ORAL
  Filled 2022-04-01 (×8): qty 1

## 2022-04-01 NOTE — Progress Notes (Signed)
STROKE TEAM PROGRESS NOTE   INTERVAL HISTORY No family is at the bedside. Pt lying in bed, eating burgers. Neuro stable, no acute event overnight, TEE scheduled for tomorrow. Her right arm soft tissue swelling is improving.   Vitals:   03/31/22 2308 04/01/22 0420 04/01/22 0743 04/01/22 1142  BP: (!) 145/73 124/70 (!) 147/86 123/78  Pulse: 66 63  68  Resp:   20 18  Temp: 97.6 F (36.4 C) 98 F (36.7 C) 98.3 F (36.8 C) 98.1 F (36.7 C)  TempSrc: Oral Oral Oral Oral  SpO2: 99% 97%  96%  Weight:      Height:       CBC:  Recent Labs  Lab 03/29/22 0210 03/29/22 0820 03/31/22 0301 04/01/22 0347  WBC 6.2   < > 7.3 7.6  NEUTROABS 3.4  --   --   --   HGB 12.8   < > 12.6 11.9*  HCT 37.7   < > 36.5 34.2*  MCV 94.5   < > 93.6 94.0  PLT 212   < > 186 185   < > = values in this interval not displayed.   Basic Metabolic Panel:  Recent Labs  Lab 03/30/22 1750 03/31/22 0301 04/01/22 0347  NA  --  139 136  K  --  3.5 4.0  CL  --  107 104  CO2  --  26 25  GLUCOSE  --  188* 263*  BUN  --  7 20  CREATININE  --  0.72 0.90  CALCIUM  --  8.8* 8.9  MG 2.0  --   --   PHOS 3.6  --   --    Lipid Panel:  Recent Labs  Lab 03/31/22 0301  CHOL 254*  TRIG 194*  HDL 42  CHOLHDL 6.0  VLDL 39  LDLCALC 173*   HgbA1c:  Recent Labs  Lab 03/31/22 0301  HGBA1C 12.8*   Urine Drug Screen: No results for input(s): LABOPIA, COCAINSCRNUR, LABBENZ, AMPHETMU, THCU, LABBARB in the last 168 hours.  Alcohol Level No results for input(s): ETH in the last 168 hours.  IMAGING past 24 hours No results found.  PHYSICAL EXAM General:  Alert, well-nourished, well-developed patient in no acute distress Respiratory:  Regular, unlabored respirations on room air  NEURO:  Mental Status: AA&Ox3  Speech/Language: speech is without dysarthria or aphasia.  Repetition, fluency, and comprehension intact. Extremities: R forearm swelling with bruise, tender to touch  Cranial Nerves:  II: PERRL. Visual  fields full.  III, IV, VI: EOMI. Eyelids elevate symmetrically.  V: Sensation is diminished on left side VII: Smile is symmetrical.   VIII: hearing intact to voice. IX, X: Phonation is normal.  XII: tongue is midline without fasciculations. Motor: 5/5 strength to RUE and RLE, 4/5 to LUE and 3/5 to LLE Tone: is normal and bulk is normal Sensation- Diminished on left side Coordination: FTN with ataxia on the left. Gait- deferred   ASSESSMENT/PLAN Ms. JERALENE MCEVER is a 56 y.o. female with history of DM, CAD, NSTEMI and recent PCI presenting with left sided weakness and numbness.  Patient received TNK to treat stroke.  MRI revealed small, scattered infarcts in the right cerebral cortex and thalamus, likely embolic.  She did developed swelling and pain to her right wrist and arm near her radial artery access site.  Ultrasound of this area was ordered.  Stroke: Bilateral scattered infarct s/p TNK likely secondary to recent PCI and embolic source, ? Occult afib Code Stroke  CT head No acute abnormality. ASPECTS 10.    CTA head & neck no LVO or hemodynamically significant stenosis MRI  scattered small infarcts in bilateral cerebral cortex and right thalamus 2D Echo EF 60-65%, no atrial level shunt, cystic liver disease noted TEE schedule tomorrow. If negative, will consider loop recorder LDL 173 HgbA1c 12.8 VTE prophylaxis - SCDs clopidogrel 75 mg daily prior to admission, now on clopidogrel 75 mg daily no ASA due to allergy.  Pt potentially OCEANIC trial candidate Therapy recommendations:  CIR Disposition:  pending  CAD NSTEMI/recent PCI Patient underwent PCI on 5/27 Cardiology on board Continue Plavix  Hypertension Home meds:  carvedilol 25 mg BID, lisinopril-HCTZ 20-25 mg daily Stable On Coreg 25 twice daily Long-term BP goal normotensive  Hyperlipidemia Home meds:  none LDL 173, goal < 70 Add rosuvastatin 40 mg daily  Continue statin at discharge  Diabetes type II  Uncontrolled Home meds:  none HgbA1c 12.8, goal < 7.0 CBGs Diabetes coordinator consult Insulin glargine 14 units BID Insulin aspart 4 units TID with meals SSI Close PCP follow-up for better DM control  Other Stroke Risk Factors Obesity, Body mass index is 33.28 kg/m., BMI >/= 30 associated with increased stroke risk, recommend weight loss, diet and exercise as appropriate   Other Active Problems Enlarged heterogenous thyroid - Thyroid US as outpatient Right UE swelling post procedure - CT RUE, RUE artrial and venous doppler negative - likely soft tissue swelling due to bruises  Hospital day # 2  Rosalin Hawking, MD PhD Stroke Neurology 04/01/2022 1:07 PM   To contact Stroke Continuity provider, please refer to [email or web address] After hours, contact General Neurology

## 2022-04-01 NOTE — Progress Notes (Signed)
Progress Note  Patient: [name] ZOX:[id number] DOB: 01/01/1966  DOA: 03/29/2022  DOS: 04/01/2022    Brief hospital course: [name] is a 56 y.o. 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  Assessment and Plan: NSTEMI, CAD: s/p LHC and DES for instent thrombosis with excellent results, with plan to medically manage D2 disease. LVEF 60-65%, no WMA, normal RV systolic function --Cannot tolerate ASA, will continue plavix, coreg, rosuvastatin (started), lisinopril with repeat lipids in 6-8 weeks.  Acute bilateral CVAs, predominantly right sided consistent with embolic source (?related to recent catheterization and/or cryptogenic embolic source) as well as lacunar infarct in right thalamus.  - No CES on echo, TEE and longer term cardiac monitoring recommended by neurology. TEE scheduled 5/31 at 2pm. EP consulted to consider loop recorder.  - Plavix, statin, CIR disposition pending. Risk factor modification as discussed elsewhere.  Uncontrolled type 2 diabetes mellitus with hyperglycemia, without long-term current use of insulin (HCC): HbA1c 12.5%.  - Continue basal insulin, SSI, meal coverage novolog.  - Consider semaglutide given concomitant obesity.   Cystic disease of liver --significance unclear, follow-up with GI as an outpatient  Hematemesis --by report prior to arrival --Hgb stable, no reported recurrence; consider MWT; in absence of further issues, NSTEMI and code stroke evaluation, will defer GI evaluation to outpt setting. --continue PPI  HTN:  - Continue coreg, changed lisinopril to losartan per cardiology.   HLD: LDL 173. - Started rosuvastatin. Cardiology states goal LDL is <55.  Obesity: Body mass index is 33.28 kg/m.  -  Consider GLP agonist given this and diabetes.  RUE edema: Negative venous and arterial U/S investigations. Improving.   Thyromegaly:  - Thyroid U/S as outpatient.   Subjective: No new complaints, states she is RHD and can't move her left leg, can't feel it either. Abnormal movement in LUE as well. No chest pain currently. Has intermittent headache. Right arm swelling is improving.   Objective: Vitals:   03/31/22 1932 03/31/22 2308 04/01/22 0420 04/01/22 0743  BP: 124/67 (!) 145/73 124/70 (!) 147/86  Pulse: 72 66 63   Resp: 19   20  Temp: 98.2 F (36.8 C) 97.6 F (36.4 C) 98 F (36.7 C) 98.3 F (36.8 C)  TempSrc: Oral Oral Oral Oral  SpO2: 99% 99% 97%   Weight:      Height:       Gen: 56 y.o. female in no distress Pulm: Nonlabored breathing room air. Clear CV: Regular rate and rhythm. No murmur, rub, or gallop. No JVD, no pitting dependent edema. GI: Abdomen soft, non-tender, non-distended, with normoactive bowel sounds.  Ext: Warm, no deformities Skin: Right forearm with evolving ecchymosis and edema. No active bleeding. No other rashes, lesions or ulcers on visualized skin. Neuro: Alert and oriented. Absent sensation and very diminished strength in LLE, abnormal sensation in LUE, diminished strength LUE. Psych: Judgement and insight appear fair. Mood euthymic & affect congruent. Behavior is appropriate.    Data Personally reviewed:  CBC: Recent Labs  Lab 03/29/22 0210 03/29/22 0820 03/30/22 0052 03/31/22 0301 04/01/22 0347  WBC 6.2  --  9.0 7.3 7.6  NEUTROABS 3.4  --   --   --   --   HGB 12.8 13.2 12.0 12.6 11.9*  HCT 37.7  38.5 34.6* 36.5 34.2*  MCV 94.5  --  95.6 93.6 94.0  PLT 212  --  171 186 185   Basic Metabolic Panel: Recent Labs  Lab 03/29/22 0210 03/30/22 0052 03/30/22 1750 03/31/22 0301 04/01/22 0347  NA 137 136  --  139 136  K 3.8 4.1  --  3.5 4.0  CL 104 106  --  107 104  CO2 23 25  --  26 25  GLUCOSE 394* 379*  --  188* 263*  BUN 16 13  --   7 20  CREATININE 0.91 0.81  --  0.72 0.90  CALCIUM 9.2 8.3*  --  8.8* 8.9  MG  --   --  2.0  --   --   PHOS  --   --  3.6  --   --    GFR: Estimated Creatinine Clearance: 76.8 mL/min (by C-G formula based on SCr of 0.9 mg/dL). Liver Function Tests: No results for input(s): AST, ALT, ALKPHOS, BILITOT, PROT, ALBUMIN in the last 168 hours. No results for input(s): LIPASE, AMYLASE in the last 168 hours. No results for input(s): AMMONIA in the last 168 hours. Coagulation Profile: No results for input(s): INR, PROTIME in the last 168 hours. Cardiac Enzymes: No results for input(s): CKTOTAL, CKMB, CKMBINDEX, TROPONINI in the last 168 hours. BNP (last 3 results) No results for input(s): PROBNP in the last 8760 hours. HbA1C: Recent Labs    03/31/22 0301  HGBA1C 12.8*   CBG: Recent Labs  Lab 03/31/22 0527 03/31/22 1155 03/31/22 1726 03/31/22 2135 04/01/22 0746  GLUCAP 171* 262* 276* 187* 269*   Lipid Profile: Recent Labs    03/29/22 1634 03/31/22 0301  CHOL 269* 254*  HDL 48 42  LDLCALC 194* 173*  TRIG 136 194*  CHOLHDL 5.6 6.0   Thyroid Function Tests: No results for input(s): TSH, T4TOTAL, FREET4, T3FREE, THYROIDAB in the last 72 hours. Anemia Panel: No results for input(s): VITAMINB12, FOLATE, FERRITIN, TIBC, IRON, RETICCTPCT in the last 72 hours. Urine analysis:    Component Value Date/Time   COLORURINE YELLOW 11/15/2019 0426   APPEARANCEUR CLEAR 11/15/2019 0426   LABSPEC >=1.030 03/16/2021 1640   PHURINE 5.5 03/16/2021 1640   GLUCOSEU 100 (A) 03/16/2021 1640   HGBUR NEGATIVE 03/16/2021 1640   BILIRUBINUR NEGATIVE 03/16/2021 1640   KETONESUR NEGATIVE 03/16/2021 1640   PROTEINUR NEGATIVE 03/16/2021 1640   UROBILINOGEN 0.2 03/16/2021 1640   NITRITE NEGATIVE 03/16/2021 1640   LEUKOCYTESUR SMALL (A) 03/16/2021 1640   Recent Results (from the past 240 hour(s))  MRSA Next Gen by PCR, Nasal     Status: Abnormal   Collection Time: 03/29/22  4:59 PM   Specimen:  Nasal Mucosa; Nasal Swab  Result Value Ref Range Status   MRSA by PCR Next Gen DETECTED (A) NOT DETECTED Final    Comment: RESULT CALLED TO, READ BACK BY AND VERIFIED WITH: S,KING RN @1920  03/29/22 E,[place] (NOTE) The GeneXpert MRSA Assay (FDA approved for NASAL specimens only), is one component of a comprehensive MRSA colonization surveillance program. It is not intended to diagnose MRSA infection nor to guide or monitor treatment for MRSA infections. Test performance is not FDA approved in patients less than 34 years old. Performed at [facility], [address], [place], [place]      CT HEAD WO CONTRAST (32355)  Result Date: 03/31/2022 CLINICAL DATA:  Stroke.  Follow-up headache after T NK EXAM: CT HEAD WITHOUT CONTRAST TECHNIQUE: Contiguous axial images were  obtained from the base of the skull through the vertex without intravenous contrast. RADIATION DOSE REDUCTION: This exam was performed according to the departmental dose-optimization program which includes automated exposure control, adjustment of the mA and/or kV according to patient size and/or use of iterative reconstruction technique. COMPARISON:  Yesterday FINDINGS: Brain: No evidence of acute infarction, hemorrhage, hydrocephalus, extra-axial collection or mass lesion/mass effect. Vascular: No hyperdense vessel or unexpected calcification. Skull: Normal. Negative for fracture or focal lesion. Sinuses/Orbits: No acute finding. IMPRESSION: Negative head CT. Electronically Signed   By: Tiburcio Pea M.D.   On: 03/31/2022 03:55   CT HEAD WO CONTRAST ( )  Result Date: 03/30/2022 CLINICAL DATA:  Neuro deficit, acute, stroke suspected s/p cath now with LLE weakness EXAM: CT ANGIOGRAPHY HEAD AND NECK TECHNIQUE: Multidetector CT imaging of the head and neck was performed using the standard protocol during bolus administration of intravenous contrast. Multiplanar CT image reconstructions and MIPs were obtained to evaluate  the vascular anatomy. Carotid stenosis measurements (when applicable) are obtained utilizing NASCET criteria, using the distal internal carotid diameter as the denominator. RADIATION DOSE REDUCTION: This exam was performed according to the departmental dose-optimization program which includes automated exposure control, adjustment of the mA and/or kV according to patient size and/or use of iterative reconstruction technique. CONTRAST:  OMNIPAQUE IOHEXOL 350 MG/ML SOLN COMPARISON:  None Available. FINDINGS: CT HEAD FINDINGS Brain: No evidence of acute large vascular territory infarction, hemorrhage, hydrocephalus, extra-axial collection or mass lesion/mass effect. Partially empty sella. Vascular: See below. Skull: No acute fracture. Sinuses: Clear sinuses. Orbits: No acute finding. Review of the MIP images confirms the above findings CTA NECK FINDINGS Aortic arch: Great vessel origins are patent without significant stenosis. Right carotid system: Atherosclerosis at the carotid bifurcation without greater than 50% stenosis. The common carotid artery is displaced laterally by the enlarged thyroid. Left carotid system: Atherosclerosis at the carotid bifurcation without greater than 50% stenosis. The common carotid artery is displaced laterally by the enlarged thyroid. Vertebral arteries: Left dominant. No significant (greater than 50%) stenosis. Skeleton: No acute findings. Other neck: Enlarged and heterogeneous thyroid. Upper chest: Visualized lung apices are clear. Review of the MIP images confirms the above findings CTA HEAD FINDINGS Anterior circulation: Bilateral intracranial ICAs, MCAs, and ACAs are patent without proximal hemodynamically significant stenosis. Posterior circulation: Left dominant. Bilateral intradural vertebral arteries, basilar artery and both posterior cerebral arteries are patent without proximal hemodynamically significant stenosis. Small bilateral P1 PCAs with prominent bilateral  posterior communicating arteries, anatomic variant. Venous sinuses: No evidence of dural venous sinus thrombosis. Small left transverse sinus. Anatomic variants: Detailed above. Review of the MIP images confirms the above findings IMPRESSION: 1. No evidence of acute intracranial abnormality. 2. No emergent large vessel occlusion or proximal hemodynamically significant stenosis. 3. Partially empty sella and narrowed left transverse sinus. These findings are nonspecific and often incidental anatomic variant, but can be seen with idiopathic intracranial hypertension in the correct clinical setting. 4. Enlarged heterogeneous thyroid. Recommend thyroid ultrasound (ref: J Am Coll Radiol. 2015 Feb;12(2): 143-50). Electronically Signed   By: Feliberto Harts M.D.   On: 03/30/2022 14:53   MR BRAIN WO CONTRAST  Result Date: 03/31/2022 CLINICAL DATA:  Stroke follow-up scan.  Headache. EXAM: MRI HEAD WITHOUT CONTRAST TECHNIQUE: Multiplanar, multiecho pulse sequences of the brain and surrounding structures were obtained without intravenous contrast. COMPARISON:  Head CT from yesterday FINDINGS: Brain: Subcentimeter acute infarcts in the right temporal, right occipital, and bifrontal cortex. Acute lacunar infarct in the right  thalamus. Mild chronic vessel ischemia hemispheric white matter. Brain volume is normal. No hemorrhage, hydrocephalus, or collection. Vascular: Major flow voids are preserved Skull and upper cervical spine: No focal marrow lesion Sinuses/Orbits: No acute finding IMPRESSION: 1. Small scattered acute infarcts in the cerebral cortex and right thalamus, pattern suggesting central embolic disease. 2. Mild chronic small vessel ischemia. Electronically Signed   By: Tiburcio Pea M.D.   On: 03/31/2022 11:33   CT FOREARM RIGHT WO CONTRAST  Result Date: 03/30/2022 CLINICAL DATA:  Forearm trauma status post right radial catheterization. EXAM: CT OF THE RIGHT FOREARM WITHOUT CONTRAST TECHNIQUE: Multidetector  CT imaging was performed according to the standard protocol. Multiplanar CT image reconstructions were also generated. RADIATION DOSE REDUCTION: This exam was performed according to the departmental dose-optimization program which includes automated exposure control, adjustment of the mA and/or kV according to patient size and/or use of iterative reconstruction technique. COMPARISON:  None Available. FINDINGS: Bones/Joint/Cartilage No evidence of fracture or dislocation. No evidence of arthropathy. No periosteal reaction. Ligaments Suboptimally assessed by CT. Muscles and Tendons Muscles of the flexor and access stent compartments are within normal limits. Thenar and hypothenar muscles are normal in caliber. Soft tissues Moderate subcutaneous soft tissue edema about the radial aspect of the forearm. No fluid collection or seroma. IMPRESSION: 1. Moderate subcutaneous soft tissue edema about the radial aspect of the forearm without evidence of drainable fluid collection or hematoma/seroma. 2. Muscles are normal in bulk and density. The tendons of the flexor and extensor compartments are within normal limits. 3.  No acute osseous abnormality. Electronically Signed   By: Larose Hires D.O.   On: 03/30/2022 15:57   DG Chest Port 1 View  Result Date: 03/31/2022 CLINICAL DATA:  Stroke Coronary artery disease Hypertension EXAM: PORTABLE CHEST - 1 VIEW COMPARISON:  05/22/2022 FINDINGS: Cardiomediastinal silhouette and pulmonary vasculature are within normal limits. Lungs are clear. IMPRESSION: No acute cardiopulmonary process. Electronically Signed   By: Acquanetta Belling M.D.   On: 03/31/2022 07:23   VAS [place] UPPER EXTREMITY ARTERIAL DUPLEX  Result Date: 03/31/2022  UPPER EXTREMITY DUPLEX STUDY Patient Name:  VERLISA VARA Chargois  Date of Exam:   03/31/2022 Medical Rec #: [id number]       Accession #:    [id number] Date of Birth: 1966/04/05      Patient Gender: F Patient Age:   37 years Exam Location:  [facility]  Procedure:      VAS [place] UPPER EXTREMITY ARTERIAL DUPLEX Referring Phys: --------------------------------------------------------------------------------  Indications: Pain, swelling, bruising. History:     Patient has a history of catheterization via right radial artery.  Risk Factors: Hypertension, hyperlipidemia, Diabetes, no history of smoking,               prior MI, coronary artery disease, prior CVA. Comparison Study: No prior studies. Performing Technologist: Jean Rosenthal RDMS, RVT  Examination Guidelines: A complete evaluation includes B-mode imaging, spectral Doppler, color Doppler, and power Doppler as needed of all accessible portions of each vessel. Bilateral testing is considered an integral part of a complete examination. Limited examinations for reoccurring indications may be performed as noted.  Right Doppler Findings: +---------------+----------+---------+--------+--------+ Site           PSV (cm/s)Waveform StenosisComments +---------------+----------+---------+--------+--------+ Subclavian Prox131       triphasic                 +---------------+----------+---------+--------+--------+ Subclavian Mid 102       triphasic                 +---------------+----------+---------+--------+--------+  Subclavian Dist121       triphasic                 +---------------+----------+---------+--------+--------+ Axillary       137       triphasic                 +---------------+----------+---------+--------+--------+ Brachial Prox  116       triphasic                 +---------------+----------+---------+--------+--------+ Brachial Mid   112       triphasic                 +---------------+----------+---------+--------+--------+ Brachial Dist  116       triphasic                 +---------------+----------+---------+--------+--------+ Radial Prox    68        triphasic                 +---------------+----------+---------+--------+--------+ Radial Mid     76         triphasic                 +---------------+----------+---------+--------+--------+ Radial Dist    49        triphasic                 +---------------+----------+---------+--------+--------+ Ulnar Prox     47        triphasic                 +---------------+----------+---------+--------+--------+ Ulnar Mid      77        triphasic                 +---------------+----------+---------+--------+--------+ Ulnar Dist     48        triphasic                 +---------------+----------+---------+--------+--------+ Palmar Arch    62        triphasic                 +---------------+----------+---------+--------+--------+     Summary:  Right: No obstruction visualized in the right upper extremity. *See table(s) above for measurements and observations. Electronically signed by Lemar Livings MD on 03/31/2022 at 12:57:32 PM.    Final    CT HEAD CODE STROKE WO CONTRAST  Result Date: 03/30/2022 CLINICAL DATA:  Code stroke.  Neuro deficit, acute, stroke suspected EXAM: CT HEAD WITHOUT CONTRAST TECHNIQUE: Contiguous axial images were obtained from the base of the skull through the vertex without intravenous contrast. RADIATION DOSE REDUCTION: This exam was performed according to the departmental dose-optimization program which includes automated exposure control, adjustment of the mA and/or kV according to patient size and/or use of iterative reconstruction technique. COMPARISON:  None Available. FINDINGS: Brain: No evidence of acute large vascular territory infarction, hemorrhage, hydrocephalus, extra-axial collection or mass lesion/mass effect. Partially empty sella. Vascular: No hyperdense vessel identified Skull: No acute fracture. Sinuses/Orbits: Clear sinuses.  No acute orbital findings. Other: No mastoid effusions. ASPECTS [facility] Stroke Program Early CT Score) total score (0-10 with 10 being normal): 10. IMPRESSION: 1. No evidence of acute intracranial abnormality.  ASPECTS is  10. 2. Partially empty sella, which is often a normal anatomic variant but can be associated with idiopathic intracranial hypertension. Code stroke imaging results were communicated on 03/30/2022 at 1:51 pm to provider [facility] via secure text paging. Electronically Signed   By:  Feliberto Harts M.D.   On: 03/30/2022 13:55   VAS [place] UPPER EXTREMITY VENOUS DUPLEX  Result Date: 03/31/2022 UPPER VENOUS STUDY  Patient Name:  EDELINE GREENING Principato  Date of Exam:   03/31/2022 Medical Rec #: [id number]       Accession #:    [id number] Date of Birth: 07/06/1966      Patient Gender: F Patient Age:   27 years Exam Location:  [facility] Procedure:      VAS [place] UPPER EXTREMITY VENOUS DUPLEX Referring Phys: Scheryl Marten XU --------------------------------------------------------------------------------  Indications: Swelling, pain, bruising Other Indications: S/P radial catheterization. Comparison Study: No prior studies. Performing Technologist: Jean Rosenthal RDMS, RVT  Examination Guidelines: A complete evaluation includes B-mode imaging, spectral Doppler, color Doppler, and power Doppler as needed of all accessible portions of each vessel. Bilateral testing is considered an integral part of a complete examination. Limited examinations for reoccurring indications may be performed as noted.  Right Findings: +----------+------------+---------+-----------+----------+-------+ RIGHT     CompressiblePhasicitySpontaneousPropertiesSummary +----------+------------+---------+-----------+----------+-------+ IJV           Full       Yes       Yes                      +----------+------------+---------+-----------+----------+-------+ Subclavian               Yes       Yes                      +----------+------------+---------+-----------+----------+-------+ Axillary      Full       Yes       Yes                      +----------+------------+---------+-----------+----------+-------+ Brachial      Full                                           +----------+------------+---------+-----------+----------+-------+ Radial        Full                                          +----------+------------+---------+-----------+----------+-------+ Ulnar         Full                                          +----------+------------+---------+-----------+----------+-------+ Cephalic      Full                                          +----------+------------+---------+-----------+----------+-------+ Basilic       Full                                          +----------+------------+---------+-----------+----------+-------+  Left Findings: +----------+------------+---------+-----------+----------+-------+ LEFT      CompressiblePhasicitySpontaneousPropertiesSummary +----------+------------+---------+-----------+----------+-------+ Subclavian               Yes       Yes                      +----------+------------+---------+-----------+----------+-------+  Summary:  Right: No evidence of deep vein thrombosis in the upper extremity. No evidence of superficial vein thrombosis in the upper extremity.  Left: No evidence of thrombosis in the subclavian.  *See table(s) above for measurements and observations.  Diagnosing physician: Lemar Livings MD Electronically signed by Lemar Livings MD on 03/31/2022 at 12:57:24 PM.    Final    CT ANGIO HEAD NECK W WO CM (CODE STROKE)  Result Date: 03/30/2022 CLINICAL DATA:  Neuro deficit, acute, stroke suspected s/p cath now with LLE weakness EXAM: CT ANGIOGRAPHY HEAD AND NECK TECHNIQUE: Multidetector CT imaging of the head and neck was performed using the standard protocol during bolus administration of intravenous contrast. Multiplanar CT image reconstructions and MIPs were obtained to evaluate the vascular anatomy. Carotid stenosis measurements (when applicable) are obtained utilizing NASCET criteria, using the distal internal carotid diameter as the denominator.  RADIATION DOSE REDUCTION: This exam was performed according to the departmental dose-optimization program which includes automated exposure control, adjustment of the mA and/or kV according to patient size and/or use of iterative reconstruction technique. CONTRAST:  OMNIPAQUE IOHEXOL 350 MG/ML SOLN COMPARISON:  None Available. FINDINGS: CT HEAD FINDINGS Brain: No evidence of acute large vascular territory infarction, hemorrhage, hydrocephalus, extra-axial collection or mass lesion/mass effect. Partially empty sella. Vascular: See below. Skull: No acute fracture. Sinuses: Clear sinuses. Orbits: No acute finding. Review of the MIP images confirms the above findings CTA NECK FINDINGS Aortic arch: Great vessel origins are patent without significant stenosis. Right carotid system: Atherosclerosis at the carotid bifurcation without greater than 50% stenosis. The common carotid artery is displaced laterally by the enlarged thyroid. Left carotid system: Atherosclerosis at the carotid bifurcation without greater than 50% stenosis. The common carotid artery is displaced laterally by the enlarged thyroid. Vertebral arteries: Left dominant. No significant (greater than 50%) stenosis. Skeleton: No acute findings. Other neck: Enlarged and heterogeneous thyroid. Upper chest: Visualized lung apices are clear. Review of the MIP images confirms the above findings CTA HEAD FINDINGS Anterior circulation: Bilateral intracranial ICAs, MCAs, and ACAs are patent without proximal hemodynamically significant stenosis. Posterior circulation: Left dominant. Bilateral intradural vertebral arteries, basilar artery and both posterior cerebral arteries are patent without proximal hemodynamically significant stenosis. Small bilateral P1 PCAs with prominent bilateral posterior communicating arteries, anatomic variant. Venous sinuses: No evidence of dural venous sinus thrombosis. Small left transverse sinus. Anatomic variants: Detailed above.  Review of the MIP images confirms the above findings IMPRESSION: 1. No evidence of acute intracranial abnormality. 2. No emergent large vessel occlusion or proximal hemodynamically significant stenosis. 3. Partially empty sella and narrowed left transverse sinus. These findings are nonspecific and often incidental anatomic variant, but can be seen with idiopathic intracranial hypertension in the correct clinical setting. 4. Enlarged heterogeneous thyroid. Recommend thyroid ultrasound (ref: J Am Coll Radiol. 2015 Feb;12(2): 143-50). Electronically Signed   By: Feliberto Harts M.D.   On: 03/30/2022 14:53    Family Communication: At bedside  Disposition: Status is: Inpatient awaiting CIR disposition pending TEE/loop recorder possible 5/31.  Planned Discharge Destination: Rehab  Tyrone Nine, MD 04/01/2022 11:23 AM Page by [email or web address]

## 2022-04-01 NOTE — Progress Notes (Signed)
Inpatient Rehab Admissions Coordinator:   Per therapy recommendations, patient was screened for CIR candidacy by Megan Salon, MS, CCC-SLP . At this time, Pt. Has not yet demonstrated ability to tolerate OOB and is not at a level where I think she could tolerate the intensity of CIR.   Pt. may have potential to progress to becoming a potential CIR candidate, so CIR admissions team will follow and monitor for progress and participation with therapies and place consult order if Pt. appears to be an appropriate candidate. Please contact me with any questions.    Megan Salon, MS, CCC-SLP Rehab Admissions Coordinator  [phone] (celll) [phone] (office)

## 2022-04-01 NOTE — H&P (View-Only) (Signed)
Progress Note  Patient Name: [name] Date of Encounter: 04/01/2022  [facility] Cardiologist: Donato Heinz, MD   Subjective   No acute events overnight.  LUE function improved but LLE still severely impaired.  Denies chest pain, palpitations, or dyspnea.   NPO for TEE today.    Inpatient Medications    Scheduled Meds:  carvedilol  25 mg Oral BID   Chlorhexidine Gluconate Cloth  6 each Topical Daily   clopidogrel  75 mg Oral Daily   insulin aspart  0-20 Units Subcutaneous TID WC   insulin aspart  0-5 Units Subcutaneous QHS   insulin aspart  6 Units Subcutaneous TID WC   insulin glargine-yfgn  14 Units Subcutaneous BID   mupirocin ointment  1 application. Nasal BID   pantoprazole  40 mg Oral QHS   rosuvastatin  40 mg Oral Daily   senna-docusate  1 tablet Oral BID   sodium chloride flush  3 mL Intravenous Q12H   sodium chloride flush  3 mL Intravenous Q12H   Continuous Infusions:  sodium chloride Stopped (03/30/22 1820)   sodium chloride 100 mL/hr at 04/01/22 0752   PRN Meds: sodium chloride, acetaminophen **OR** acetaminophen (TYLENOL) oral liquid 160 mg/5 mL **OR** acetaminophen, albuterol, diphenhydrAMINE **OR** diphenhydrAMINE, labetalol, nitroGLYCERIN, ondansetron (ZOFRAN) IV, sodium chloride flush   Vital Signs    Vitals:   03/31/22 1932 03/31/22 2308 04/01/22 0420 04/01/22 0743  BP: 124/67 (!) 145/73 124/70 (!) 147/86  Pulse: 72 66 63   Resp: 19   20  Temp: 98.2 F (36.8 C) 97.6 F (36.4 C) 98 F (36.7 C) 98.3 F (36.8 C)  TempSrc: Oral Oral Oral Oral  SpO2: 99% 99% 97%   Weight:      Height:        Intake/Output Summary (Last 24 hours) at 04/01/2022 0914 Last data filed at 04/01/2022 0645 Gross per 24 hour  Intake 133.17 ml  Output 1600 ml  Net -1466.83 ml      03/29/2022    6:00 AM 01/28/2021    9:25 AM 12/06/2020   10:41 AM  Last 3 Weights  Weight (lbs) 200 lb 204 lb 3.2 oz 208 lb  Weight (kg) 90.719 kg 92.625 kg 94.348 kg       Telemetry    NSR - Personally Reviewed  ECG    EKG none today  Physical Exam   GEN: No acute distress.   Neck: No JVD Cardiac: RRR, no murmurs, rubs, or gallops.  Respiratory: Clear to auscultation bilaterally. GI: Soft, nontender, non-distended  MS: No edema; No deformity. R forearm moderate hematoma Neuro:  LLE weakness Psych: Normal affect   Labs    High Sensitivity Troponin:   Recent Labs  Lab 03/29/22 0820 03/29/22 1002 03/29/22 1634 03/30/22 0350 03/30/22 0540  TROPONINIHS 87* 134* 302* 184* 162*     Chemistry Recent Labs  Lab 03/30/22 0052 03/30/22 1750 03/31/22 0301 04/01/22 0347  NA 136  --  139 136  K 4.1  --  3.5 4.0  CL 106  --  107 104  CO2 25  --  26 25  GLUCOSE 379*  --  188* 263*  BUN 13  --  7 20  CREATININE 0.81  --  0.72 0.90  CALCIUM 8.3*  --  8.8* 8.9  MG  --  2.0  --   --   GFRNONAA >60  --  >60 >60  ANIONGAP 5  --  6 7    Lipids  Recent Labs  Lab 03/31/22 0301  CHOL 254*  TRIG 194*  HDL 42  LDLCALC 173*  CHOLHDL 6.0    Hematology Recent Labs  Lab 03/30/22 0052 03/31/22 0301 04/01/22 0347  WBC 9.0 7.3 7.6  RBC 3.62* 3.90 3.64*  HGB 12.0 12.6 11.9*  HCT 34.6* 36.5 34.2*  MCV 95.6 93.6 94.0  MCH 33.1 32.3 32.7  MCHC 34.7 34.5 34.8  RDW 12.0 11.9 12.0  PLT 171 186 185   Thyroid No results for input(s): TSH, FREET4 in the last 168 hours.  BNPNo results for input(s): BNP, PROBNP in the last 168 hours.  DDimer  Recent Labs  Lab 03/29/22 0820  DDIMER 0.29     Radiology    CT HEAD WO CONTRAST (5MM)  Result Date: 03/31/2022 CLINICAL DATA:  Stroke.  Follow-up headache after T NK EXAM: CT HEAD WITHOUT CONTRAST TECHNIQUE: Contiguous axial images were obtained from the base of the skull through the vertex without intravenous contrast. RADIATION DOSE REDUCTION: This exam was performed according to the departmental dose-optimization program which includes automated exposure control, adjustment of the mA and/or kV  according to patient size and/or use of iterative reconstruction technique. COMPARISON:  Yesterday FINDINGS: Brain: No evidence of acute infarction, hemorrhage, hydrocephalus, extra-axial collection or mass lesion/mass effect. Vascular: No hyperdense vessel or unexpected calcification. Skull: Normal. Negative for fracture or focal lesion. Sinuses/Orbits: No acute finding. IMPRESSION: Negative head CT. Electronically Signed   By: Jorje Guild M.D.   On: 03/31/2022 03:55   CT HEAD WO CONTRAST (5MM)  Result Date: 03/30/2022 CLINICAL DATA:  Neuro deficit, acute, stroke suspected s/p cath now with LLE weakness EXAM: CT ANGIOGRAPHY HEAD AND NECK TECHNIQUE: Multidetector CT imaging of the head and neck was performed using the standard protocol during bolus administration of intravenous contrast. Multiplanar CT image reconstructions and MIPs were obtained to evaluate the vascular anatomy. Carotid stenosis measurements (when applicable) are obtained utilizing NASCET criteria, using the distal internal carotid diameter as the denominator. RADIATION DOSE REDUCTION: This exam was performed according to the departmental dose-optimization program which includes automated exposure control, adjustment of the mA and/or kV according to patient size and/or use of iterative reconstruction technique. CONTRAST:  136mL OMNIPAQUE IOHEXOL 350 MG/ML SOLN COMPARISON:  None Available. FINDINGS: CT HEAD FINDINGS Brain: No evidence of acute large vascular territory infarction, hemorrhage, hydrocephalus, extra-axial collection or mass lesion/mass effect. Partially empty sella. Vascular: See below. Skull: No acute fracture. Sinuses: Clear sinuses. Orbits: No acute finding. Review of the MIP images confirms the above findings CTA NECK FINDINGS Aortic arch: Great vessel origins are patent without significant stenosis. Right carotid system: Atherosclerosis at the carotid bifurcation without greater than 50% stenosis. The common carotid artery  is displaced laterally by the enlarged thyroid. Left carotid system: Atherosclerosis at the carotid bifurcation without greater than 50% stenosis. The common carotid artery is displaced laterally by the enlarged thyroid. Vertebral arteries: Left dominant. No significant (greater than 50%) stenosis. Skeleton: No acute findings. Other neck: Enlarged and heterogeneous thyroid. Upper chest: Visualized lung apices are clear. Review of the MIP images confirms the above findings CTA HEAD FINDINGS Anterior circulation: Bilateral intracranial ICAs, MCAs, and ACAs are patent without proximal hemodynamically significant stenosis. Posterior circulation: Left dominant. Bilateral intradural vertebral arteries, basilar artery and both posterior cerebral arteries are patent without proximal hemodynamically significant stenosis. Small bilateral P1 PCAs with prominent bilateral posterior communicating arteries, anatomic variant. Venous sinuses: No evidence of dural venous sinus thrombosis. Small left transverse sinus. Anatomic variants:  Detailed above. Review of the MIP images confirms the above findings IMPRESSION: 1. No evidence of acute intracranial abnormality. 2. No emergent large vessel occlusion or proximal hemodynamically significant stenosis. 3. Partially empty sella and narrowed left transverse sinus. These findings are nonspecific and often incidental anatomic variant, but can be seen with idiopathic intracranial hypertension in the correct clinical setting. 4. Enlarged heterogeneous thyroid. Recommend thyroid ultrasound (ref: J Am Coll Radiol. 2015 Feb;12(2): 143-50). Electronically Signed   By: Margaretha Sheffield M.D.   On: 03/30/2022 14:53   MR BRAIN WO CONTRAST  Result Date: 03/31/2022 CLINICAL DATA:  Stroke follow-up scan.  Headache. EXAM: MRI HEAD WITHOUT CONTRAST TECHNIQUE: Multiplanar, multiecho pulse sequences of the brain and surrounding structures were obtained without intravenous contrast. COMPARISON:  Head  CT from yesterday FINDINGS: Brain: Subcentimeter acute infarcts in the right temporal, right occipital, and bifrontal cortex. Acute lacunar infarct in the right thalamus. Mild chronic vessel ischemia hemispheric white matter. Brain volume is normal. No hemorrhage, hydrocephalus, or collection. Vascular: Major flow voids are preserved Skull and upper cervical spine: No focal marrow lesion Sinuses/Orbits: No acute finding IMPRESSION: 1. Small scattered acute infarcts in the cerebral cortex and right thalamus, pattern suggesting central embolic disease. 2. Mild chronic small vessel ischemia. Electronically Signed   By: Jorje Guild M.D.   On: 03/31/2022 11:33   CT FOREARM RIGHT WO CONTRAST  Result Date: 03/30/2022 CLINICAL DATA:  Forearm trauma status post right radial catheterization. EXAM: CT OF THE RIGHT FOREARM WITHOUT CONTRAST TECHNIQUE: Multidetector CT imaging was performed according to the standard protocol. Multiplanar CT image reconstructions were also generated. RADIATION DOSE REDUCTION: This exam was performed according to the departmental dose-optimization program which includes automated exposure control, adjustment of the mA and/or kV according to patient size and/or use of iterative reconstruction technique. COMPARISON:  None Available. FINDINGS: Bones/Joint/Cartilage No evidence of fracture or dislocation. No evidence of arthropathy. No periosteal reaction. Ligaments Suboptimally assessed by CT. Muscles and Tendons Muscles of the flexor and access stent compartments are within normal limits. Thenar and hypothenar muscles are normal in caliber. Soft tissues Moderate subcutaneous soft tissue edema about the radial aspect of the forearm. No fluid collection or seroma. IMPRESSION: 1. Moderate subcutaneous soft tissue edema about the radial aspect of the forearm without evidence of drainable fluid collection or hematoma/seroma. 2. Muscles are normal in bulk and density. The tendons of the flexor and  extensor compartments are within normal limits. 3.  No acute osseous abnormality. Electronically Signed   By: Keane Police D.O.   On: 03/30/2022 15:57   DG Chest Port 1 View  Result Date: 03/31/2022 CLINICAL DATA:  Stroke Coronary artery disease Hypertension EXAM: PORTABLE CHEST - 1 VIEW COMPARISON:  05/22/2022 FINDINGS: Cardiomediastinal silhouette and pulmonary vasculature are within normal limits. Lungs are clear. IMPRESSION: No acute cardiopulmonary process. Electronically Signed   By: Miachel Roux M.D.   On: 03/31/2022 07:23   VAS [place] UPPER EXTREMITY ARTERIAL DUPLEX  Result Date: 03/31/2022  UPPER EXTREMITY DUPLEX STUDY Patient Name:  [name]  Date of Exam:   03/31/2022 Medical Rec #: [id number]       Accession #:    [id number] Date of Birth: Nov 14, 1965      Patient Gender: F Patient Age:   10 years Exam Location:  [facility] Procedure:      VAS [place] UPPER EXTREMITY ARTERIAL DUPLEX Referring Phys: --------------------------------------------------------------------------------  Indications: Pain, swelling, bruising. History:     Patient has a history of  catheterization via right radial artery.  Risk Factors: Hypertension, hyperlipidemia, Diabetes, no history of smoking,               prior MI, coronary artery disease, prior CVA. Comparison Study: No prior studies. Performing Technologist: Darlin Coco RDMS, RVT  Examination Guidelines: A complete evaluation includes B-mode imaging, spectral Doppler, color Doppler, and power Doppler as needed of all accessible portions of each vessel. Bilateral testing is considered an integral part of a complete examination. Limited examinations for reoccurring indications may be performed as noted.  Right Doppler Findings: +---------------+----------+---------+--------+--------+ Site           PSV (cm/s)Waveform StenosisComments +---------------+----------+---------+--------+--------+ Subclavian Prox131       triphasic                  +---------------+----------+---------+--------+--------+ Subclavian Mid 102       triphasic                 +---------------+----------+---------+--------+--------+ Subclavian Dist121       triphasic                 +---------------+----------+---------+--------+--------+ Axillary       137       triphasic                 +---------------+----------+---------+--------+--------+ Brachial Prox  116       triphasic                 +---------------+----------+---------+--------+--------+ Brachial Mid   112       triphasic                 +---------------+----------+---------+--------+--------+ Brachial Dist  116       triphasic                 +---------------+----------+---------+--------+--------+ Radial Prox    68        triphasic                 +---------------+----------+---------+--------+--------+ Radial Mid     76        triphasic                 +---------------+----------+---------+--------+--------+ Radial Dist    49        triphasic                 +---------------+----------+---------+--------+--------+ Ulnar Prox     47        triphasic                 +---------------+----------+---------+--------+--------+ Ulnar Mid      77        triphasic                 +---------------+----------+---------+--------+--------+ Ulnar Dist     48        triphasic                 +---------------+----------+---------+--------+--------+ Palmar Arch    62        triphasic                 +---------------+----------+---------+--------+--------+     Summary:  Right: No obstruction visualized in the right upper extremity. *See table(s) above for measurements and observations. Electronically signed by Servando Snare MD on 03/31/2022 at 12:57:32 PM.    Final    ECHOCARDIOGRAM COMPLETE  Result Date: 03/30/2022    ECHOCARDIOGRAM REPORT   Patient Name:   [name] Date of Exam: 03/30/2022 Medical Rec #:  [id number]      Height:       65.0 in  Accession #:    [id number]     Weight:       200.0 lb Date of Birth:  November 20, 1965     BSA:          1.978 m Patient Age:    51 years       BP:           110/65 mmHg Patient Gender: F              HR:           62 bpm. Exam Location:  Inpatient Procedure: 2D Echo, Cardiac Doppler, Color Doppler and Intracardiac            Opacification Agent Indications:    NSTEMI  History:        Patient has prior history of Echocardiogram examinations, most                 recent 07/02/2020. CAD and Previous Myocardial Infarction,                 Signs/Symptoms:Chest Pain; Risk Factors:Hypertension and                 Diabetes. Sarcoidosis.  Sonographer:    Clayton Lefort RDCS (AE) Referring Phys: [id number] [facility] [place] IMPRESSIONS  1. Cystic liver disease noted.  2. Left ventricular ejection fraction, by estimation, is 60 to 65%. The left ventricle has normal function. The left ventricle has no regional wall motion abnormalities. There is moderate left ventricular hypertrophy. Left ventricular diastolic parameters were normal.  3. Right ventricular systolic function is normal. The right ventricular size is normal.  4. The mitral valve is normal in structure. No evidence of mitral valve regurgitation. No evidence of mitral stenosis.  5. The aortic valve is tricuspid. There is mild calcification of the aortic valve. Aortic valve regurgitation is mild. Aortic valve sclerosis is present, with no evidence of aortic valve stenosis.  6. The inferior vena cava is normal in size with greater than 50% respiratory variability, suggesting right atrial pressure of 3 mmHg. FINDINGS  Left Ventricle: Left ventricular ejection fraction, by estimation, is 60 to 65%. The left ventricle has normal function. The left ventricle has no regional wall motion abnormalities. Definity contrast agent was given IV to delineate the left ventricular  endocardial borders. The left ventricular internal cavity size was normal in size. There is moderate left  ventricular hypertrophy. Left ventricular diastolic parameters were normal. Right Ventricle: The right ventricular size is normal. No increase in right ventricular wall thickness. Right ventricular systolic function is normal. Left Atrium: Left atrial size was normal in size. Right Atrium: Right atrial size was normal in size. Pericardium: There is no evidence of pericardial effusion. Mitral Valve: The mitral valve is normal in structure. No evidence of mitral valve regurgitation. No evidence of mitral valve stenosis. Tricuspid Valve: The tricuspid valve is normal in structure. Tricuspid valve regurgitation is not demonstrated. No evidence of tricuspid stenosis. Aortic Valve: The aortic valve is tricuspid. There is mild calcification of the aortic valve. Aortic valve regurgitation is mild. Aortic valve sclerosis is present, with no evidence of aortic valve stenosis. Pulmonic Valve: The pulmonic valve was normal in structure. Pulmonic valve regurgitation is not visualized. No evidence of pulmonic stenosis. Aorta: The aortic root is normal in size and structure. Venous: The inferior vena cava is normal in size with greater than 50%  respiratory variability, suggesting right atrial pressure of 3 mmHg. IAS/Shunts: No atrial level shunt detected by color flow Doppler. Additional Comments: Cystic liver disease noted.  LEFT VENTRICLE PLAX 2D LVOT diam:     2.10 cm   Diastology LVOT Area:     3.46 cm  LV e' medial:    5.98 cm/s                          LV E/e' medial:  11.2                          LV e' lateral:   17.10 cm/s                          LV E/e' lateral: 3.9  IVC IVC diam: 1.80 cm LEFT ATRIUM              Index        RIGHT ATRIUM           Index LA Vol (A2C):   37.4 ml  18.91 ml/m  RA Area:     14.80 cm LA Vol (A4C):   100.0 ml 50.55 ml/m  RA Volume:   31.30 ml  15.82 ml/m LA Biplane Vol: 67.5 ml  34.12 ml/m   AORTA Ao Root diam: 3.20 cm Ao Asc diam:  0.40 cm MITRAL VALVE MV Area (PHT): 3.03 cm    SHUNTS  MV Decel Time: 250 msec    Systemic Diam: 2.10 cm MV E velocity: 66.90 cm/s MV A velocity: 50.50 cm/s MV E/A ratio:  1.32 Jenkins Rouge MD Electronically signed by Jenkins Rouge MD Signature Date/Time: 03/30/2022/11:36:07 AM    Final    CT HEAD CODE STROKE WO CONTRAST  Result Date: 03/30/2022 CLINICAL DATA:  Code stroke.  Neuro deficit, acute, stroke suspected EXAM: CT HEAD WITHOUT CONTRAST TECHNIQUE: Contiguous axial images were obtained from the base of the skull through the vertex without intravenous contrast. RADIATION DOSE REDUCTION: This exam was performed according to the departmental dose-optimization program which includes automated exposure control, adjustment of the mA and/or kV according to patient size and/or use of iterative reconstruction technique. COMPARISON:  None Available. FINDINGS: Brain: No evidence of acute large vascular territory infarction, hemorrhage, hydrocephalus, extra-axial collection or mass lesion/mass effect. Partially empty sella. Vascular: No hyperdense vessel identified Skull: No acute fracture. Sinuses/Orbits: Clear sinuses.  No acute orbital findings. Other: No mastoid effusions. ASPECTS [facility] Stroke Program Early CT Score) total score (0-10 with 10 being normal): 10. IMPRESSION: 1. No evidence of acute intracranial abnormality.  ASPECTS is 10. 2. Partially empty sella, which is often a normal anatomic variant but can be associated with idiopathic intracranial hypertension. Code stroke imaging results were communicated on 03/30/2022 at 1:51 pm to provider [facility] via secure text paging. Electronically Signed   By: Margaretha Sheffield M.D.   On: 03/30/2022 13:55   VAS [place] UPPER EXTREMITY VENOUS DUPLEX  Result Date: 03/31/2022 UPPER VENOUS STUDY  Patient Name:  [name]  Date of Exam:   03/31/2022 Medical Rec #: [id number]       Accession #:    [id number] Date of Birth: 02-11-1966      Patient Gender: F Patient Age:   52 years Exam Location:  [facility]  Procedure:      VAS [place] UPPER EXTREMITY VENOUS DUPLEX Referring Phys: Cornelius Moras XU --------------------------------------------------------------------------------  Indications: Swelling, pain, bruising Other Indications: S/P radial catheterization. Comparison Study: No prior studies. Performing Technologist: Darlin Coco RDMS, RVT  Examination Guidelines: A complete evaluation includes B-mode imaging, spectral Doppler, color Doppler, and power Doppler as needed of all accessible portions of each vessel. Bilateral testing is considered an integral part of a complete examination. Limited examinations for reoccurring indications may be performed as noted.  Right Findings: +----------+------------+---------+-----------+----------+-------+ RIGHT     CompressiblePhasicitySpontaneousPropertiesSummary +----------+------------+---------+-----------+----------+-------+ IJV           Full       Yes       Yes                      +----------+------------+---------+-----------+----------+-------+ Subclavian               Yes       Yes                      +----------+------------+---------+-----------+----------+-------+ Axillary      Full       Yes       Yes                      +----------+------------+---------+-----------+----------+-------+ Brachial      Full                                          +----------+------------+---------+-----------+----------+-------+ Radial        Full                                          +----------+------------+---------+-----------+----------+-------+ Ulnar         Full                                          +----------+------------+---------+-----------+----------+-------+ Cephalic      Full                                          +----------+------------+---------+-----------+----------+-------+ Basilic       Full                                          +----------+------------+---------+-----------+----------+-------+   Left Findings: +----------+------------+---------+-----------+----------+-------+ LEFT      CompressiblePhasicitySpontaneousPropertiesSummary +----------+------------+---------+-----------+----------+-------+ Subclavian               Yes       Yes                      +----------+------------+---------+-----------+----------+-------+  Summary:  Right: No evidence of deep vein thrombosis in the upper extremity. No evidence of superficial vein thrombosis in the upper extremity.  Left: No evidence of thrombosis in the subclavian.  *See table(s) above for measurements and observations.  Diagnosing physician: Servando Snare MD Electronically signed by Servando Snare MD on 03/31/2022 at 12:57:24 PM.    Final    CT ANGIO HEAD NECK W WO CM (CODE STROKE)  Result Date: 03/30/2022  CLINICAL DATA:  Neuro deficit, acute, stroke suspected s/p cath now with LLE weakness EXAM: CT ANGIOGRAPHY HEAD AND NECK TECHNIQUE: Multidetector CT imaging of the head and neck was performed using the standard protocol during bolus administration of intravenous contrast. Multiplanar CT image reconstructions and MIPs were obtained to evaluate the vascular anatomy. Carotid stenosis measurements (when applicable) are obtained utilizing NASCET criteria, using the distal internal carotid diameter as the denominator. RADIATION DOSE REDUCTION: This exam was performed according to the departmental dose-optimization program which includes automated exposure control, adjustment of the mA and/or kV according to patient size and/or use of iterative reconstruction technique. CONTRAST:  1103mL OMNIPAQUE IOHEXOL 350 MG/ML SOLN COMPARISON:  None Available. FINDINGS: CT HEAD FINDINGS Brain: No evidence of acute large vascular territory infarction, hemorrhage, hydrocephalus, extra-axial collection or mass lesion/mass effect. Partially empty sella. Vascular: See below. Skull: No acute fracture. Sinuses: Clear sinuses. Orbits: No acute finding. Review of  the MIP images confirms the above findings CTA NECK FINDINGS Aortic arch: Great vessel origins are patent without significant stenosis. Right carotid system: Atherosclerosis at the carotid bifurcation without greater than 50% stenosis. The common carotid artery is displaced laterally by the enlarged thyroid. Left carotid system: Atherosclerosis at the carotid bifurcation without greater than 50% stenosis. The common carotid artery is displaced laterally by the enlarged thyroid. Vertebral arteries: Left dominant. No significant (greater than 50%) stenosis. Skeleton: No acute findings. Other neck: Enlarged and heterogeneous thyroid. Upper chest: Visualized lung apices are clear. Review of the MIP images confirms the above findings CTA HEAD FINDINGS Anterior circulation: Bilateral intracranial ICAs, MCAs, and ACAs are patent without proximal hemodynamically significant stenosis. Posterior circulation: Left dominant. Bilateral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

## 2022-04-01 NOTE — Progress Notes (Signed)
Progress Note  Patient Name: [name] Date of Encounter: 04/01/2022  [facility] Cardiologist: Donato Heinz, MD   Subjective   No acute events overnight.  LUE function improved but LLE still severely impaired.  Denies chest pain, palpitations, or dyspnea.   NPO for TEE today.    Inpatient Medications    Scheduled Meds:  carvedilol  25 mg Oral BID   Chlorhexidine Gluconate Cloth  6 each Topical Daily   clopidogrel  75 mg Oral Daily   insulin aspart  0-20 Units Subcutaneous TID WC   insulin aspart  0-5 Units Subcutaneous QHS   insulin aspart  6 Units Subcutaneous TID WC   insulin glargine-yfgn  14 Units Subcutaneous BID   mupirocin ointment  1 application. Nasal BID   pantoprazole  40 mg Oral QHS   rosuvastatin  40 mg Oral Daily   senna-docusate  1 tablet Oral BID   sodium chloride flush  3 mL Intravenous Q12H   sodium chloride flush  3 mL Intravenous Q12H   Continuous Infusions:  sodium chloride Stopped (03/30/22 1820)   sodium chloride 100 mL/hr at 04/01/22 0752   PRN Meds: sodium chloride, acetaminophen **OR** acetaminophen (TYLENOL) oral liquid 160 mg/5 mL **OR** acetaminophen, albuterol, diphenhydrAMINE **OR** diphenhydrAMINE, labetalol, nitroGLYCERIN, ondansetron (ZOFRAN) IV, sodium chloride flush   Vital Signs    Vitals:   03/31/22 1932 03/31/22 2308 04/01/22 0420 04/01/22 0743  BP: 124/67 (!) 145/73 124/70 (!) 147/86  Pulse: 72 66 63   Resp: 19   20  Temp: 98.2 F (36.8 C) 97.6 F (36.4 C) 98 F (36.7 C) 98.3 F (36.8 C)  TempSrc: Oral Oral Oral Oral  SpO2: 99% 99% 97%   Weight:      Height:        Intake/Output Summary (Last 24 hours) at 04/01/2022 0914 Last data filed at 04/01/2022 0645 Gross per 24 hour  Intake 133.17 ml  Output 1600 ml  Net -1466.83 ml      03/29/2022    6:00 AM 01/28/2021    9:25 AM 12/06/2020   10:41 AM  Last 3 Weights  Weight (lbs) 200 lb 204 lb 3.2 oz 208 lb  Weight (kg) 90.719 kg 92.625 kg 94.348 kg       Telemetry    NSR - Personally Reviewed  ECG    EKG none today  Physical Exam   GEN: No acute distress.   Neck: No JVD Cardiac: RRR, no murmurs, rubs, or gallops.  Respiratory: Clear to auscultation bilaterally. GI: Soft, nontender, non-distended  MS: No edema; No deformity. R forearm moderate hematoma Neuro:  LLE weakness Psych: Normal affect   Labs    High Sensitivity Troponin:   Recent Labs  Lab 03/29/22 0820 03/29/22 1002 03/29/22 1634 03/30/22 0350 03/30/22 0540  TROPONINIHS 87* 134* 302* 184* 162*     Chemistry Recent Labs  Lab 03/30/22 0052 03/30/22 1750 03/31/22 0301 04/01/22 0347  NA 136  --  139 136  K 4.1  --  3.5 4.0  CL 106  --  107 104  CO2 25  --  26 25  GLUCOSE 379*  --  188* 263*  BUN 13  --  7 20  CREATININE 0.81  --  0.72 0.90  CALCIUM 8.3*  --  8.8* 8.9  MG  --  2.0  --   --   GFRNONAA >60  --  >60 >60  ANIONGAP 5  --  6 7    Lipids  Recent Labs  Lab 03/31/22 0301  CHOL 254*  TRIG 194*  HDL 42  LDLCALC 173*  CHOLHDL 6.0    Hematology Recent Labs  Lab 03/30/22 0052 03/31/22 0301 04/01/22 0347  WBC 9.0 7.3 7.6  RBC 3.62* 3.90 3.64*  HGB 12.0 12.6 11.9*  HCT 34.6* 36.5 34.2*  MCV 95.6 93.6 94.0  MCH 33.1 32.3 32.7  MCHC 34.7 34.5 34.8  RDW 12.0 11.9 12.0  PLT 171 186 185   Thyroid No results for input(s): TSH, FREET4 in the last 168 hours.  BNPNo results for input(s): BNP, PROBNP in the last 168 hours.  DDimer  Recent Labs  Lab 03/29/22 0820  DDIMER 0.29     Radiology    CT HEAD WO CONTRAST (5MM)  Result Date: 03/31/2022 CLINICAL DATA:  Stroke.  Follow-up headache after T NK EXAM: CT HEAD WITHOUT CONTRAST TECHNIQUE: Contiguous axial images were obtained from the base of the skull through the vertex without intravenous contrast. RADIATION DOSE REDUCTION: This exam was performed according to the departmental dose-optimization program which includes automated exposure control, adjustment of the mA and/or kV  according to patient size and/or use of iterative reconstruction technique. COMPARISON:  Yesterday FINDINGS: Brain: No evidence of acute infarction, hemorrhage, hydrocephalus, extra-axial collection or mass lesion/mass effect. Vascular: No hyperdense vessel or unexpected calcification. Skull: Normal. Negative for fracture or focal lesion. Sinuses/Orbits: No acute finding. IMPRESSION: Negative head CT. Electronically Signed   By: Jorje Guild M.D.   On: 03/31/2022 03:55   CT HEAD WO CONTRAST (5MM)  Result Date: 03/30/2022 CLINICAL DATA:  Neuro deficit, acute, stroke suspected s/p cath now with LLE weakness EXAM: CT ANGIOGRAPHY HEAD AND NECK TECHNIQUE: Multidetector CT imaging of the head and neck was performed using the standard protocol during bolus administration of intravenous contrast. Multiplanar CT image reconstructions and MIPs were obtained to evaluate the vascular anatomy. Carotid stenosis measurements (when applicable) are obtained utilizing NASCET criteria, using the distal internal carotid diameter as the denominator. RADIATION DOSE REDUCTION: This exam was performed according to the departmental dose-optimization program which includes automated exposure control, adjustment of the mA and/or kV according to patient size and/or use of iterative reconstruction technique. CONTRAST:  136mL OMNIPAQUE IOHEXOL 350 MG/ML SOLN COMPARISON:  None Available. FINDINGS: CT HEAD FINDINGS Brain: No evidence of acute large vascular territory infarction, hemorrhage, hydrocephalus, extra-axial collection or mass lesion/mass effect. Partially empty sella. Vascular: See below. Skull: No acute fracture. Sinuses: Clear sinuses. Orbits: No acute finding. Review of the MIP images confirms the above findings CTA NECK FINDINGS Aortic arch: Great vessel origins are patent without significant stenosis. Right carotid system: Atherosclerosis at the carotid bifurcation without greater than 50% stenosis. The common carotid artery  is displaced laterally by the enlarged thyroid. Left carotid system: Atherosclerosis at the carotid bifurcation without greater than 50% stenosis. The common carotid artery is displaced laterally by the enlarged thyroid. Vertebral arteries: Left dominant. No significant (greater than 50%) stenosis. Skeleton: No acute findings. Other neck: Enlarged and heterogeneous thyroid. Upper chest: Visualized lung apices are clear. Review of the MIP images confirms the above findings CTA HEAD FINDINGS Anterior circulation: Bilateral intracranial ICAs, MCAs, and ACAs are patent without proximal hemodynamically significant stenosis. Posterior circulation: Left dominant. Bilateral intradural vertebral arteries, basilar artery and both posterior cerebral arteries are patent without proximal hemodynamically significant stenosis. Small bilateral P1 PCAs with prominent bilateral posterior communicating arteries, anatomic variant. Venous sinuses: No evidence of dural venous sinus thrombosis. Small left transverse sinus. Anatomic variants:  Detailed above. Review of the MIP images confirms the above findings IMPRESSION: 1. No evidence of acute intracranial abnormality. 2. No emergent large vessel occlusion or proximal hemodynamically significant stenosis. 3. Partially empty sella and narrowed left transverse sinus. These findings are nonspecific and often incidental anatomic variant, but can be seen with idiopathic intracranial hypertension in the correct clinical setting. 4. Enlarged heterogeneous thyroid. Recommend thyroid ultrasound (ref: J Am Coll Radiol. 2015 Feb;12(2): 143-50). Electronically Signed   By: Margaretha Sheffield M.D.   On: 03/30/2022 14:53   MR BRAIN WO CONTRAST  Result Date: 03/31/2022 CLINICAL DATA:  Stroke follow-up scan.  Headache. EXAM: MRI HEAD WITHOUT CONTRAST TECHNIQUE: Multiplanar, multiecho pulse sequences of the brain and surrounding structures were obtained without intravenous contrast. COMPARISON:  Head  CT from yesterday FINDINGS: Brain: Subcentimeter acute infarcts in the right temporal, right occipital, and bifrontal cortex. Acute lacunar infarct in the right thalamus. Mild chronic vessel ischemia hemispheric white matter. Brain volume is normal. No hemorrhage, hydrocephalus, or collection. Vascular: Major flow voids are preserved Skull and upper cervical spine: No focal marrow lesion Sinuses/Orbits: No acute finding IMPRESSION: 1. Small scattered acute infarcts in the cerebral cortex and right thalamus, pattern suggesting central embolic disease. 2. Mild chronic small vessel ischemia. Electronically Signed   By: Jorje Guild M.D.   On: 03/31/2022 11:33   CT FOREARM RIGHT WO CONTRAST  Result Date: 03/30/2022 CLINICAL DATA:  Forearm trauma status post right radial catheterization. EXAM: CT OF THE RIGHT FOREARM WITHOUT CONTRAST TECHNIQUE: Multidetector CT imaging was performed according to the standard protocol. Multiplanar CT image reconstructions were also generated. RADIATION DOSE REDUCTION: This exam was performed according to the departmental dose-optimization program which includes automated exposure control, adjustment of the mA and/or kV according to patient size and/or use of iterative reconstruction technique. COMPARISON:  None Available. FINDINGS: Bones/Joint/Cartilage No evidence of fracture or dislocation. No evidence of arthropathy. No periosteal reaction. Ligaments Suboptimally assessed by CT. Muscles and Tendons Muscles of the flexor and access stent compartments are within normal limits. Thenar and hypothenar muscles are normal in caliber. Soft tissues Moderate subcutaneous soft tissue edema about the radial aspect of the forearm. No fluid collection or seroma. IMPRESSION: 1. Moderate subcutaneous soft tissue edema about the radial aspect of the forearm without evidence of drainable fluid collection or hematoma/seroma. 2. Muscles are normal in bulk and density. The tendons of the flexor and  extensor compartments are within normal limits. 3.  No acute osseous abnormality. Electronically Signed   By: Keane Police D.O.   On: 03/30/2022 15:57   DG Chest Port 1 View  Result Date: 03/31/2022 CLINICAL DATA:  Stroke Coronary artery disease Hypertension EXAM: PORTABLE CHEST - 1 VIEW COMPARISON:  05/22/2022 FINDINGS: Cardiomediastinal silhouette and pulmonary vasculature are within normal limits. Lungs are clear. IMPRESSION: No acute cardiopulmonary process. Electronically Signed   By: Miachel Roux M.D.   On: 03/31/2022 07:23   VAS [place] UPPER EXTREMITY ARTERIAL DUPLEX  Result Date: 03/31/2022  UPPER EXTREMITY DUPLEX STUDY Patient Name:  [name]  Date of Exam:   03/31/2022 Medical Rec #: [id number]       Accession #:    [id number] Date of Birth: 1966/01/22      Patient Gender: F Patient Age:   43 years Exam Location:  [facility] Procedure:      VAS [place] UPPER EXTREMITY ARTERIAL DUPLEX Referring Phys: --------------------------------------------------------------------------------  Indications: Pain, swelling, bruising. History:     Patient has a history of  catheterization via right radial artery.  Risk Factors: Hypertension, hyperlipidemia, Diabetes, no history of smoking,               prior MI, coronary artery disease, prior CVA. Comparison Study: No prior studies. Performing Technologist: Darlin Coco RDMS, RVT  Examination Guidelines: A complete evaluation includes B-mode imaging, spectral Doppler, color Doppler, and power Doppler as needed of all accessible portions of each vessel. Bilateral testing is considered an integral part of a complete examination. Limited examinations for reoccurring indications may be performed as noted.  Right Doppler Findings: +---------------+----------+---------+--------+--------+ Site           PSV (cm/s)Waveform StenosisComments +---------------+----------+---------+--------+--------+ Subclavian Prox131       triphasic                  +---------------+----------+---------+--------+--------+ Subclavian Mid 102       triphasic                 +---------------+----------+---------+--------+--------+ Subclavian Dist121       triphasic                 +---------------+----------+---------+--------+--------+ Axillary       137       triphasic                 +---------------+----------+---------+--------+--------+ Brachial Prox  116       triphasic                 +---------------+----------+---------+--------+--------+ Brachial Mid   112       triphasic                 +---------------+----------+---------+--------+--------+ Brachial Dist  116       triphasic                 +---------------+----------+---------+--------+--------+ Radial Prox    68        triphasic                 +---------------+----------+---------+--------+--------+ Radial Mid     76        triphasic                 +---------------+----------+---------+--------+--------+ Radial Dist    49        triphasic                 +---------------+----------+---------+--------+--------+ Ulnar Prox     47        triphasic                 +---------------+----------+---------+--------+--------+ Ulnar Mid      77        triphasic                 +---------------+----------+---------+--------+--------+ Ulnar Dist     48        triphasic                 +---------------+----------+---------+--------+--------+ Palmar Arch    62        triphasic                 +---------------+----------+---------+--------+--------+     Summary:  Right: No obstruction visualized in the right upper extremity. *See table(s) above for measurements and observations. Electronically signed by Servando Snare MD on 03/31/2022 at 12:57:32 PM.    Final    ECHOCARDIOGRAM COMPLETE  Result Date: 03/30/2022    ECHOCARDIOGRAM REPORT   Patient Name:   MARIYAH ZELLMAN Cremer Date of Exam: 03/30/2022 Medical Rec #:  [id number]      Height:       65.0 in  Accession #:    [id number]     Weight:       200.0 lb Date of Birth:  1965-12-09     BSA:          1.978 m Patient Age:    18 years       BP:           110/65 mmHg Patient Gender: F              HR:           62 bpm. Exam Location:  Inpatient Procedure: 2D Echo, Cardiac Doppler, Color Doppler and Intracardiac            Opacification Agent Indications:    NSTEMI  History:        Patient has prior history of Echocardiogram examinations, most                 recent 07/02/2020. CAD and Previous Myocardial Infarction,                 Signs/Symptoms:Chest Pain; Risk Factors:Hypertension and                 Diabetes. Sarcoidosis.  Sonographer:    Clayton Lefort RDCS (AE) Referring Phys: [id number] [facility] [place] IMPRESSIONS  1. Cystic liver disease noted.  2. Left ventricular ejection fraction, by estimation, is 60 to 65%. The left ventricle has normal function. The left ventricle has no regional wall motion abnormalities. There is moderate left ventricular hypertrophy. Left ventricular diastolic parameters were normal.  3. Right ventricular systolic function is normal. The right ventricular size is normal.  4. The mitral valve is normal in structure. No evidence of mitral valve regurgitation. No evidence of mitral stenosis.  5. The aortic valve is tricuspid. There is mild calcification of the aortic valve. Aortic valve regurgitation is mild. Aortic valve sclerosis is present, with no evidence of aortic valve stenosis.  6. The inferior vena cava is normal in size with greater than 50% respiratory variability, suggesting right atrial pressure of 3 mmHg. FINDINGS  Left Ventricle: Left ventricular ejection fraction, by estimation, is 60 to 65%. The left ventricle has normal function. The left ventricle has no regional wall motion abnormalities. Definity contrast agent was given IV to delineate the left ventricular  endocardial borders. The left ventricular internal cavity size was normal in size. There is moderate left  ventricular hypertrophy. Left ventricular diastolic parameters were normal. Right Ventricle: The right ventricular size is normal. No increase in right ventricular wall thickness. Right ventricular systolic function is normal. Left Atrium: Left atrial size was normal in size. Right Atrium: Right atrial size was normal in size. Pericardium: There is no evidence of pericardial effusion. Mitral Valve: The mitral valve is normal in structure. No evidence of mitral valve regurgitation. No evidence of mitral valve stenosis. Tricuspid Valve: The tricuspid valve is normal in structure. Tricuspid valve regurgitation is not demonstrated. No evidence of tricuspid stenosis. Aortic Valve: The aortic valve is tricuspid. There is mild calcification of the aortic valve. Aortic valve regurgitation is mild. Aortic valve sclerosis is present, with no evidence of aortic valve stenosis. Pulmonic Valve: The pulmonic valve was normal in structure. Pulmonic valve regurgitation is not visualized. No evidence of pulmonic stenosis. Aorta: The aortic root is normal in size and structure. Venous: The inferior vena cava is normal in size with greater than 50%  respiratory variability, suggesting right atrial pressure of 3 mmHg. IAS/Shunts: No atrial level shunt detected by color flow Doppler. Additional Comments: Cystic liver disease noted.  LEFT VENTRICLE PLAX 2D LVOT diam:     2.10 cm   Diastology LVOT Area:     3.46 cm  LV e' medial:    5.98 cm/s                          LV E/e' medial:  11.2                          LV e' lateral:   17.10 cm/s                          LV E/e' lateral: 3.9  IVC IVC diam: 1.80 cm LEFT ATRIUM              Index        RIGHT ATRIUM           Index LA Vol (A2C):   37.4 ml  18.91 ml/m  RA Area:     14.80 cm LA Vol (A4C):   100.0 ml 50.55 ml/m  RA Volume:   31.30 ml  15.82 ml/m LA Biplane Vol: 67.5 ml  34.12 ml/m   AORTA Ao Root diam: 3.20 cm Ao Asc diam:  0.40 cm MITRAL VALVE MV Area (PHT): 3.03 cm    SHUNTS  MV Decel Time: 250 msec    Systemic Diam: 2.10 cm MV E velocity: 66.90 cm/s MV A velocity: 50.50 cm/s MV E/A ratio:  1.32 Jenkins Rouge MD Electronically signed by Jenkins Rouge MD Signature Date/Time: 03/30/2022/11:36:07 AM    Final    CT HEAD CODE STROKE WO CONTRAST  Result Date: 03/30/2022 CLINICAL DATA:  Code stroke.  Neuro deficit, acute, stroke suspected EXAM: CT HEAD WITHOUT CONTRAST TECHNIQUE: Contiguous axial images were obtained from the base of the skull through the vertex without intravenous contrast. RADIATION DOSE REDUCTION: This exam was performed according to the departmental dose-optimization program which includes automated exposure control, adjustment of the mA and/or kV according to patient size and/or use of iterative reconstruction technique. COMPARISON:  None Available. FINDINGS: Brain: No evidence of acute large vascular territory infarction, hemorrhage, hydrocephalus, extra-axial collection or mass lesion/mass effect. Partially empty sella. Vascular: No hyperdense vessel identified Skull: No acute fracture. Sinuses/Orbits: Clear sinuses.  No acute orbital findings. Other: No mastoid effusions. ASPECTS [facility] Stroke Program Early CT Score) total score (0-10 with 10 being normal): 10. IMPRESSION: 1. No evidence of acute intracranial abnormality.  ASPECTS is 10. 2. Partially empty sella, which is often a normal anatomic variant but can be associated with idiopathic intracranial hypertension. Code stroke imaging results were communicated on 03/30/2022 at 1:51 pm to provider [facility] via secure text paging. Electronically Signed   By: Margaretha Sheffield M.D.   On: 03/30/2022 13:55   VAS [place] UPPER EXTREMITY VENOUS DUPLEX  Result Date: 03/31/2022 UPPER VENOUS STUDY  Patient Name:  ALEXONDRIA SERNA Koziel  Date of Exam:   03/31/2022 Medical Rec #: [id number]       Accession #:    [id number] Date of Birth: Mar 18, 1966      Patient Gender: F Patient Age:   11 years Exam Location:  [facility]  Procedure:      VAS [place] UPPER EXTREMITY VENOUS DUPLEX Referring Phys: Cornelius Moras XU --------------------------------------------------------------------------------  Indications: Swelling, pain, bruising Other Indications: S/P radial catheterization. Comparison Study: No prior studies. Performing Technologist: Darlin Coco RDMS, RVT  Examination Guidelines: A complete evaluation includes B-mode imaging, spectral Doppler, color Doppler, and power Doppler as needed of all accessible portions of each vessel. Bilateral testing is considered an integral part of a complete examination. Limited examinations for reoccurring indications may be performed as noted.  Right Findings: +----------+------------+---------+-----------+----------+-------+ RIGHT     CompressiblePhasicitySpontaneousPropertiesSummary +----------+------------+---------+-----------+----------+-------+ IJV           Full       Yes       Yes                      +----------+------------+---------+-----------+----------+-------+ Subclavian               Yes       Yes                      +----------+------------+---------+-----------+----------+-------+ Axillary      Full       Yes       Yes                      +----------+------------+---------+-----------+----------+-------+ Brachial      Full                                          +----------+------------+---------+-----------+----------+-------+ Radial        Full                                          +----------+------------+---------+-----------+----------+-------+ Ulnar         Full                                          +----------+------------+---------+-----------+----------+-------+ Cephalic      Full                                          +----------+------------+---------+-----------+----------+-------+ Basilic       Full                                          +----------+------------+---------+-----------+----------+-------+   Left Findings: +----------+------------+---------+-----------+----------+-------+ LEFT      CompressiblePhasicitySpontaneousPropertiesSummary +----------+------------+---------+-----------+----------+-------+ Subclavian               Yes       Yes                      +----------+------------+---------+-----------+----------+-------+  Summary:  Right: No evidence of deep vein thrombosis in the upper extremity. No evidence of superficial vein thrombosis in the upper extremity.  Left: No evidence of thrombosis in the subclavian.  *See table(s) above for measurements and observations.  Diagnosing physician: Servando Snare MD Electronically signed by Servando Snare MD on 03/31/2022 at 12:57:24 PM.    Final    CT ANGIO HEAD NECK W WO CM (CODE STROKE)  Result Date: 03/30/2022  CLINICAL DATA:  Neuro deficit, acute, stroke suspected s/p cath now with LLE weakness EXAM: CT ANGIOGRAPHY HEAD AND NECK TECHNIQUE: Multidetector CT imaging of the head and neck was performed using the standard protocol during bolus administration of intravenous contrast. Multiplanar CT image reconstructions and MIPs were obtained to evaluate the vascular anatomy. Carotid stenosis measurements (when applicable) are obtained utilizing NASCET criteria, using the distal internal carotid diameter as the denominator. RADIATION DOSE REDUCTION: This exam was performed according to the departmental dose-optimization program which includes automated exposure control, adjustment of the mA and/or kV according to patient size and/or use of iterative reconstruction technique. CONTRAST:  126mL OMNIPAQUE IOHEXOL 350 MG/ML SOLN COMPARISON:  None Available. FINDINGS: CT HEAD FINDINGS Brain: No evidence of acute large vascular territory infarction, hemorrhage, hydrocephalus, extra-axial collection or mass lesion/mass effect. Partially empty sella. Vascular: See below. Skull: No acute fracture. Sinuses: Clear sinuses. Orbits: No acute finding. Review of  the MIP images confirms the above findings CTA NECK FINDINGS Aortic arch: Great vessel origins are patent without significant stenosis. Right carotid system: Atherosclerosis at the carotid bifurcation without greater than 50% stenosis. The common carotid artery is displaced laterally by the enlarged thyroid. Left carotid system: Atherosclerosis at the carotid bifurcation without greater than 50% stenosis. The common carotid artery is displaced laterally by the enlarged thyroid. Vertebral arteries: Left dominant. No significant (greater than 50%) stenosis. Skeleton: No acute findings. Other neck: Enlarged and heterogeneous thyroid. Upper chest: Visualized lung apices are clear. Review of the MIP images confirms the above findings CTA HEAD FINDINGS Anterior circulation: Bilateral intracranial ICAs, MCAs, and ACAs are patent without proximal hemodynamically significant stenosis. Posterior circulation: Left dominant. Bilateral intradural vertebral arteries, basilar artery and both posterior cerebral arteries are patent without proximal hemodynamically significant stenosis. Small bilateral P1 PCAs with prominent bilateral posterior communicating arteries, anatomic variant. Venous sinuses: No evidence of dural venous sinus thrombosis. Small left transverse sinus. Anatomic variants: Detailed above. Review of the MIP images confirms the above findings IMPRESSION: 1. No evidence of acute intracranial abnormality. 2. No emergent large vessel occlusion or proximal hemodynamically significant stenosis. 3. Partially empty sella and narrowed left transverse sinus. These findings are nonspecific and often incidental anatomic variant, but can be seen with idiopathic intracranial hypertension in the correct clinical setting. 4. Enlarged heterogeneous thyroid. Recommend thyroid ultrasound (ref: J Am Coll Radiol. 2015 Feb;12(2): 143-50). Electronically Signed   By: Margaretha Sheffield M.D.   On: 03/30/2022 14:53    Cardiac Studies    LHC 03/29/22:   RPDA-2 lesion is 99% stenosed.   RPDA-1 lesion is 40% stenosed.   2nd Diag lesion is 80% stenosed.   Previously placed Mid Cx stent (unknown type) is  widely patent.   A stent was successfully placed.   Post intervention, there is a 0% residual stenosis.   LV end diastolic pressure is normal.   1.  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 2.  High-grade second diagonal lesion which should be treated medically due to proximity to LAD. 3.  LVEDP of 11 mmHg   The results were reviewed with Dr. Johney Frame.   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    Patient Profile     56 y.o. female with history of known CAD s/p PCI to Lcx and PDA in 10/2019 at [facility], HTN, HLD, DMII, and prior history of PE who presented to the ER with  chest burning radiating to her back found to have elevated troponin for which Cardiology was consulted.  Assessment & Plan    #Acute CVA Sudden left-sided weakness on 5/28, given tPA and admitted to neuro ICU.  Head CT unremarkable, brain MRI with small scattered acute infarcts.  Continue plavix monotherapy due to ASA allergy; follow up TEE today and will plan for cardiac monitor at discharge.  Cont crestor.   #NSTEMI: #CAD: S/p PCI of PDA due to ISR.  Cont plavix, crestor, coreg for now.  Will need regimen optimized at outpatient.  #HTN: BP well controlled, cont BB. Start low dose losartan 12.5 QPM for renal protection in DM.  #HLD: -Crestor 20mg  daily as above -Repeat lipids as outpatient in 6-8 weeks -Goal LDL<55  #DMII: Very poorly controlled. A1C 12.5. Needs close follow-up with endocrinology as an outpatient.  Start low dose losartan and jardiance 10mg  qday.     For questions or updates, please contact [facility] Please consult [email or web address] for contact info under         Signed, Early Osmond, MD  04/01/2022, 9:14 AM   Patient ID: [name], female   DOB: 1965/12/26, 56 y.o.   MRN: [id number]

## 2022-04-01 NOTE — Progress Notes (Signed)
Inpatient Rehab Admissions Coordinator:  ? ?Per therapy recommendations,  patient was screened for CIR candidacy by Jolaine Fryberger, MS, CCC-SLP. At this time, Pt. Appears to be a a potential candidate for CIR. I will place   order for rehab consult per protocol for full assessment. Please contact me any with questions. ? ?Nahsir Venezia, MS, CCC-SLP ?Rehab Admissions Coordinator  ?[phone] (celll) ?[phone] (office) ? ?

## 2022-04-01 NOTE — Progress Notes (Signed)
Physical Therapy Treatment Patient Details Name: [name] MRN: [id number] DOB: [birth year] Today's Date: 04/01/2022   History of Present Illness 56 yo F admitted 5/27 as code STEMI, taken for LHC.  On 5/28 pt had L sided sensory loss. Code stroke activated.  Received tnk. After tnk had oral mucosal bleeding, HA, chest pain, R forearm pain and swelling at site of LHC access. MRI - Small scattered acute infarcts in the cerebral cortex and right thalamus.    PT Comments    Pt is progressing steadily toward goals.  Limited mostly by significantly impaired L LE sensation and extensor tone.  Emphasis on warm up L side ROM, transition to EOB, scooting, sit to standing technique and safety and standing/balance activity with stress on un-weighting L LE before stand pivot transfer to the chair with a RW.   Recommendations for follow up therapy are one component of a multi-disciplinary discharge planning process, led by the attending physician.  Recommendations may be updated based on patient status, additional functional criteria and insurance authorization.  Follow Up Recommendations  Acute inpatient rehab (3hours/day)     Assistance Recommended at Discharge Frequent or constant Supervision/Assistance  Patient can return home with the following A lot of help with bathing/dressing/bathroom;Two people to help with walking and/or transfers;Assistance with cooking/housework;Assist for transportation;Help with stairs or ramp for entrance   Equipment Recommendations  Other (comment) (TBD)    Recommendations for Other Services Rehab consult     Precautions / Restrictions Precautions Precautions: Fall Precaution Comments: right arm painful from IVs, extensor tone in LLE     Mobility  Bed Mobility Overal bed mobility: Needs Assistance Bed Mobility: Supine to Sit     Supine to sit: Mod assist, +2 for physical assistance     General bed mobility comments: Assist L LE off bed and truncal  assist up onto L elbow with stability while pt pressed up.  Cues for sequencing and help with initiation.    Transfers Overall transfer level: Needs assistance   Transfers: Sit to/from Stand, Bed to chair/wheelchair/BSC Sit to Stand: Mod assist, +2 physical assistance Stand pivot transfers: Mod assist, Max assist, +2 safety/equipment         General transfer comment: pt needed maximal assist to get L LE prepped for stand,  cues for sequencing/ hand placement.  Assist forward and with boost    Ambulation/Gait               General Gait Details: not yet able   Stairs             Wheelchair Mobility    Modified Rankin (Stroke Patients Only) Modified Rankin (Stroke Patients Only) Pre-Morbid Rankin Score: No symptoms Modified Rankin: Severe disability     Balance Overall balance assessment: Needs assistance   Sitting balance-Leahy Scale: Fair       Standing balance-Leahy Scale: Poor Standing balance comment: Able to utilize extensor tone to work well in standing with the RW on w/shifting, unweighting to begin moving L LE and progression toward transfers.                            Cognition Arousal/Alertness: Awake/alert Behavior During Therapy: WFL for tasks assessed/performed Overall Cognitive Status: Impaired/Different from baseline Area of Impairment: Following commands, Safety/judgement, Awareness, Problem solving                       Following Commands: Follows one step  commands with increased time Safety/Judgement: Decreased awareness of safety, Decreased awareness of deficits Awareness: Intellectual Problem Solving: Decreased initiation, Difficulty sequencing, Requires verbal cues, Requires tactile cues          Exercises      General Comments        Pertinent Vitals/Pain Pain Assessment Pain Assessment: Faces Faces Pain Scale: Hurts a [place] bit Pain Location: R arm, and L ankle with flexion ROM Pain  Intervention(s): Monitored during session    Home Living                          Prior Function            PT Goals (current goals can now be found in the care plan section) Acute Rehab PT Goals Patient Stated Goal: to go back to her home PT Goal Formulation: With patient Time For Goal Achievement: 04/14/22 Potential to Achieve Goals: Good Progress towards PT goals: Progressing toward goals    Frequency    Min 4X/week      PT Plan Current plan remains appropriate    Co-evaluation              AM-PAC PT "6 Clicks" Mobility   Outcome Measure  Help needed turning from your back to your side while in a flat bed without using bedrails?: A Lot Help needed moving from lying on your back to sitting on the side of a flat bed without using bedrails?: A Lot Help needed moving to and from a bed to a chair (including a wheelchair)?: Total Help needed standing up from a chair using your arms (e.g., wheelchair or bedside chair)?: Total Help needed to walk in hospital room?: Total Help needed climbing 3-5 steps with a railing? : Total 6 Click Score: 8    End of Session   Activity Tolerance: Patient tolerated treatment well Patient left: in chair;with call bell/phone within reach;with chair alarm set Nurse Communication: Mobility status PT Visit Diagnosis: Other abnormalities of gait and mobility (R26.89);Other symptoms and signs involving the nervous system (R29.898);Hemiplegia and hemiparesis Hemiplegia - Right/Left: Right Hemiplegia - dominant/non-dominant: Dominant Hemiplegia - caused by: Cerebral infarction     Time: 9562-1308 PT Time Calculation (min) (ACUTE ONLY): 37 min  Charges:  $Therapeutic Activity: 8-22 mins $Neuromuscular Re-education: 8-22 mins                     04/01/2022  Jacinto Halim., PT Acute Rehabilitation Services [phone]  (pager) [phone]  (office)   Eliseo Gum Jazion Atteberry 04/01/2022, 2:38 PM

## 2022-04-01 NOTE — TOC Benefit Eligibility Note (Signed)
Patient Product/process development scientist completed.    The patient is currently admitted and upon discharge could be taking Jardiance 10 mg.  The current 30 day co-pay is, $573.23 due to a $8,300.00 deductible.   The patient is insured through [employer]    Roland Earl, CPhT Pharmacy Patient Advocate Specialist [facility] Patient Advocate Team Direct Number: [phone]  Fax: [phone]

## 2022-04-02 ENCOUNTER — Inpatient Hospital Stay (HOSPITAL_COMMUNITY): Payer: No Typology Code available for payment source

## 2022-04-02 ENCOUNTER — Inpatient Hospital Stay (HOSPITAL_COMMUNITY): Payer: No Typology Code available for payment source | Admitting: Anesthesiology

## 2022-04-02 ENCOUNTER — Encounter (HOSPITAL_COMMUNITY)
Admission: EM | Disposition: A | Discharge status: Rehabilitation Facility | Payer: Self-pay | Source: Home / Self Care | Attending: Internal Medicine

## 2022-04-02 ENCOUNTER — Encounter (HOSPITAL_COMMUNITY): Payer: Self-pay | Admitting: Neurology

## 2022-04-02 DIAGNOSIS — I2510 Atherosclerotic heart disease of native coronary artery without angina pectoris: Secondary | ICD-10-CM

## 2022-04-02 DIAGNOSIS — I10 Essential (primary) hypertension: Secondary | ICD-10-CM

## 2022-04-02 DIAGNOSIS — E119 Type 2 diabetes mellitus without complications: Secondary | ICD-10-CM

## 2022-04-02 DIAGNOSIS — I639 Cerebral infarction, unspecified: Secondary | ICD-10-CM

## 2022-04-02 HISTORY — PX: TEE WITHOUT CARDIOVERSION: SHX5443

## 2022-04-02 HISTORY — PX: BUBBLE STUDY: SHX6837

## 2022-04-02 LAB — CBC
HCT: 36 % (ref 36.0–46.0)
Hemoglobin: 12.1 g/dL (ref 12.0–15.0)
MCH: 32 pg (ref 26.0–34.0)
MCHC: 33.6 g/dL (ref 30.0–36.0)
MCV: 95.2 fL (ref 80.0–100.0)
Platelets: 204 10*3/uL (ref 150–400)
RBC: 3.78 MIL/uL — ABNORMAL LOW (ref 3.87–5.11)
RDW: 12.2 % (ref 11.5–15.5)
WBC: 7.7 10*3/uL (ref 4.0–10.5)
nRBC: 0 % (ref 0.0–0.2)

## 2022-04-02 LAB — BASIC METABOLIC PANEL
Anion gap: 6 (ref 5–15)
BUN: 23 mg/dL — ABNORMAL HIGH (ref 6–20)
CO2: 25 mmol/L (ref 22–32)
Calcium: 9.3 mg/dL (ref 8.9–10.3)
Chloride: 107 mmol/L (ref 98–111)
Creatinine, Ser: 1.02 mg/dL — ABNORMAL HIGH (ref 0.44–1.00)
GFR, Estimated: >60 mL/min (ref >60)
Glucose, Bld: 154 mg/dL — ABNORMAL HIGH (ref 70–99)
Potassium: 3.7 mmol/L (ref 3.5–5.1)
Sodium: 138 mmol/L (ref 135–145)

## 2022-04-02 LAB — GLUCOSE, CAPILLARY
Glucose-Capillary: 168 mg/dL — ABNORMAL HIGH (ref 70–99)
Glucose-Capillary: 117 mg/dL — ABNORMAL HIGH (ref 70–99)
Glucose-Capillary: 209 mg/dL — ABNORMAL HIGH (ref 70–99)
Glucose-Capillary: 197 mg/dL — ABNORMAL HIGH (ref 70–99)

## 2022-04-02 SURGERY — ECHOCARDIOGRAM, TRANSESOPHAGEAL
Anesthesia: Monitor Anesthesia Care

## 2022-04-02 MED ORDER — LIDOCAINE 2% (20 MG/ML) 5 ML SYRINGE
INTRAMUSCULAR | Status: DC | PRN
  Administered 2022-04-02: 100 mg via INTRAVENOUS

## 2022-04-02 MED ORDER — PROPOFOL 500 MG/50ML IV EMUL
INTRAVENOUS | Status: DC | PRN
  Administered 2022-04-02: 200 ug/kg/min via INTRAVENOUS

## 2022-04-02 MED ORDER — SODIUM CHLORIDE 0.9 % IV SOLN: INTRAVENOUS | Status: DC

## 2022-04-02 MED ORDER — PHENYLEPHRINE 80 MCG/ML (10ML) SYRINGE FOR IV PUSH (FOR BLOOD PRESSURE SUPPORT)
PREFILLED_SYRINGE | INTRAVENOUS | Status: DC | PRN
  Administered 2022-04-02 (×2): 160 ug via INTRAVENOUS
  Administered 2022-04-02: 240 ug via INTRAVENOUS

## 2022-04-02 MED ORDER — PROPOFOL 10 MG/ML IV BOLUS
INTRAVENOUS | Status: DC | PRN
  Administered 2022-04-02 (×2): 50 mg via INTRAVENOUS

## 2022-04-02 NOTE — Progress Notes (Signed)
Occupational Therapy Treatment Patient Details Name: Sandra CluckJulia M Murrill MRN: [id number] DOB: 05-05-1966 Today's Date: 04/02/2022   History of present illness 56 yo F admitted 5/27 as code STEMI, taken for LHC.  On 5/28 pt had L sided sensory loss. Code stroke activated.  Received tnk. After tnk had oral mucosal bleeding, HA, chest pain, R forearm pain and swelling at site of LHC access. MRI - Small scattered acute infarcts in the cerebral cortex and right thalamus. PMhx: polyneuropathy, obesity, HLD, CAD, DM, HTN   OT comments  Pt progressing towards goals, requiring min guard - min A for ADLs, mod A for bed mobility, and min A for stand pivot transfer to chair using RW. Pt able to complete x2 standing attempts for pericare and donning undergarments. Pt with increased movement in LUE, still presenting with decreased grasp/grip strength, administered squeeze ball and demo'd use. Pt verbalized understanding. Pt presenting with impairments listed below, will follow acutely. Continue to recommend AIR at d/c.   Recommendations for follow up therapy are one component of a multi-disciplinary discharge planning process, led by the attending physician.  Recommendations may be updated based on patient status, additional functional criteria and insurance authorization.    Follow Up Recommendations  Acute inpatient rehab (3hours/day)    Assistance Recommended at Discharge Frequent or constant Supervision/Assistance  Patient can return home with the following  Two people to help with walking and/or transfers;Two people to help with bathing/dressing/bathroom;Assistance with cooking/housework;Assistance with feeding;Help with stairs or ramp for entrance;Assist for transportation;Direct supervision/assist for financial management;Direct supervision/assist for medications management   Equipment Recommendations  None recommended by OT;Other (comment) (defer to next venue of care)    Recommendations for Other  Services Rehab consult    Precautions / Restrictions Precautions Precautions: Fall Precaution Comments: 5/27 radial heart cath Restrictions Weight Bearing Restrictions: No       Mobility Bed Mobility Overal bed mobility: Needs Assistance Bed Mobility: Supine to Sit     Supine to sit: Mod assist     General bed mobility comments: mod A to assist LLE and for trunk elevation    Transfers Overall transfer level: Needs assistance Equipment used: Rolling walker (2 wheels) Transfers: Sit to/from Stand Sit to Stand: Min assist Stand pivot transfers: Min assist         General transfer comment: sit to stand x2 attempts     Balance Overall balance assessment: Needs assistance   Sitting balance-Leahy Scale: Fair Sitting balance - Comments: cannot reach outside BOS without LOB     Standing balance-Leahy Scale: Poor Standing balance comment: reliant on external support                           ADL either performed or assessed with clinical judgement   ADL Overall ADL's : Needs assistance/impaired                 Upper Body Dressing : Minimal assistance;Sitting Upper Body Dressing Details (indicate cue type and reason): to don gown     Toilet Transfer: Minimal assistance;BSC/3in1;Stand-pivot;Rolling walker (2 wheels) Toilet Transfer Details (indicate cue type and reason): simulated to chair Toileting- Clothing Manipulation and Hygiene: Sit to/from stand;Min guard Toileting - Clothing Manipulation Details (indicate cue type and reason): pericare completed in standing     Functional mobility during ADLs: Min guard;Rolling walker (2 wheels)      Extremity/Trunk Assessment Upper Extremity Assessment LUE Deficits / Details: active shoulder flexion WFL LUE Sensation: decreased  light touch LUE Coordination: decreased fine motor   Lower Extremity Assessment Lower Extremity Assessment: Defer to PT evaluation        Vision   Vision Assessment?:  No apparent visual deficits Additional Comments: WFL for tasks assessed   Perception Perception Perception: Not tested   Praxis Praxis Praxis: Not tested    Cognition Arousal/Alertness: Awake/alert Behavior During Therapy: WFL for tasks assessed/performed Overall Cognitive Status: Impaired/Different from baseline Area of Impairment: Following commands, Safety/judgement, Awareness, Problem solving                       Following Commands: Follows one step commands consistently Safety/Judgement: Decreased awareness of safety, Decreased awareness of deficits Awareness: Intellectual Problem Solving: Requires verbal cues, Requires tactile cues          Exercises Exercises:  (administered squeeze ball for LUE, encouraged use, pt able to verbalize and demo understanding)    Shoulder Instructions       General Comments VSS on RA    Pertinent Vitals/ Pain       Pain Assessment Pain Assessment: No/denies pain Pain Intervention(s): Monitored during session  Home Living                           Bathroom Accessibility: Yes How Accessible: Accessible via walker        Lives With: Spouse    Prior Functioning/Environment              Frequency  Min 3X/week        Progress Toward Goals  OT Goals(current goals can now be found in the care plan section)  Progress towards OT goals: Progressing toward goals  Acute Rehab OT Goals Patient Stated Goal: to go home OT Goal Formulation: With patient Time For Goal Achievement: 04/14/22 Potential to Achieve Goals: Good ADL Goals Pt Will Perform Eating: with modified independence;sitting Pt Will Perform Grooming: with modified independence;sitting Pt Will Perform Upper Body Bathing: with modified independence;sitting Pt Will Perform Lower Body Bathing: with supervision;sit to/from stand Pt Will Perform Upper Body Dressing: with modified independence;sitting Pt Will Perform Lower Body Dressing: with  supervision;sit to/from stand Pt Will Transfer to Toilet: with supervision;ambulating;bedside commode Pt Will Perform Toileting - Clothing Manipulation and hygiene: with supervision;sit to/from stand Pt/caregiver will Perform Home Exercise Program: Increased ROM;Left upper extremity;With written HEP provided;Independently Additional ADL Goal #1: Pt will be Mod I in and OOB (HOB flat, no rail, increased time)  Plan Discharge plan remains appropriate;Frequency remains appropriate    Co-evaluation                 AM-PAC OT "6 Clicks" Daily Activity     Outcome Measure   Help from another person eating meals?: A Sivley Help from another person taking care of personal grooming?: A Modeste Help from another person toileting, which includes using toliet, bedpan, or urinal?: A Michele Help from another person bathing (including washing, rinsing, drying)?: A Lot Help from another person to put on and taking off regular upper body clothing?: A Meyer Help from another person to put on and taking off regular lower body clothing?: Total 6 Click Score: 15    End of Session Equipment Utilized During Treatment: Gait belt;Rolling walker (2 wheels)  OT Visit Diagnosis: Other abnormalities of gait and mobility (R26.89);Muscle weakness (generalized) (M62.81);Other symptoms and signs involving cognitive function;Hemiplegia and hemiparesis;Pain Hemiplegia - Right/Left: Left Hemiplegia - dominant/non-dominant: Non-Dominant Hemiplegia - caused by:  Cerebral infarction   Activity Tolerance Patient tolerated treatment well   Patient Left in chair;with call bell/phone within reach;with chair alarm set   Nurse Communication Mobility status        Time: 3557-3220 OT Time Calculation (min): 39 min  Charges: OT General Charges $OT Visit: 1 Visit OT Treatments $Self Care/Home Management : 38-52 mins  Alfonzo Beers, OTD, OTR/L Acute Rehab [phone]) [phone]   Mayer Masker 04/02/2022, 3:17  PM

## 2022-04-02 NOTE — Progress Notes (Signed)
Pain Progress Note  Patient: [name] UJW:[id number] DOB: 01-Sep-1966  DOA: 03/29/2022  DOS: 04/02/2022   56 year old female with past medical history of coronary artery disease, diabetes mellitus type 2, noncompliance with medication, GERD presented to hospital with hematemesis chest pain and GERD symptoms.  Admitted for evaluation of chest pain, ruled in for NSTEMI, underwent cardiac catheterization with stent placement and was cleared by cardiology for discharge.  Hospitalization was notable for hyperglycemia with hemoglobin A1c 12.3, started on insulin. Discharge was contemplated 5/28, patient developed sudden onset left-sided numbness and weakness, code stroke was called.  Patient was then transferred to the neuro ICU and TNK was administered.  Assessment and Plan: Principal Problem:   NSTEMI (non-ST elevated myocardial infarction) (HCC) Active Problems:   Left sided numbness   CAD S/P percutaneous coronary angioplasty   Uncontrolled type 2 diabetes mellitus with hyperglycemia, without long-term current use of insulin (HCC)   Hematemesis   Cystic disease of liver   Essential hypertension, benign   Hyperlipidemia   Obesity (BMI 30-39.9)   Chest pain   Stroke (cerebrum) (HCC)   NSTEMI, CAD: Patient underwent left heart catheterization with stent placement for in-stent thrombosis with excellent results.  Patient cannot tolerate aspirin so is on Plavix cortrak tube Crestor lisinopril.  Recommendation is to repeat the lipids in 6 to 8 weeks.   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  Thought to be secondary to recent catheterization/c cryptogenic in nature.  No cardioembolic source noted on 2D echocardiogram.  Awaiting for TEE and longer term cardiac monitoring recommended by neurology.  EP consulted to consider loop recorder.  We will continue Plavix and statin.  Physical therapy has recommended CIR at this time.   Uncontrolled type 2 diabetes  mellitus with hyperglycemia, without long-term current use of insulin (HCC): Latest HbA1c 12.5%.  Continue basal and sliding scale insulin. Consider semaglutide given concomitant obesity.    Cystic disease of liver Will need to follow-up with GI as outpatient.  Significance unknown at this time.   Hematemesis Reported initially but no further episodes.  Hemoglobin has remained stable.  Continue PPI at this time.  Will need to be on-Plavix.   Essential HTN:  Continue Coreg.  Lisinopril has been changed to losartan as per cardiology.    Hyperlipidemia.  Latest LDL 173. Continue Crestor.  LDL goal less than 55.   Obesity: Body mass index is 33.28 kg/m.  Could benefit from GLP agonist due to diabetes and obesity.   RUE edema: Negative venous and arterial U/S.  Mild ecchymosis but resolving.   Enlarged thyroid gland. - Thyroid U/S as outpatient.     Subjective:  Today, patient was seen and examined at bedside.  Denies pain shortness of breath.  Has mild ecchymosis on the right upper extremity.  Complains of left leg weakness.   Objective: Vitals:   04/01/22 2000 04/02/22 0000 04/02/22 0400 04/02/22 0725  BP: 123/70 112/66 116/68 117/67  Pulse: 67 64 62 62  Resp: Temp: 97.9 F (36.6 C) 98.2 F (36.8 C) 98.3 F (36.8 C) 97.8 F (36.6 C)  TempSrc: Oral Oral Oral Oral  SpO2: 100% 99% 100% 96%  Weight:      Height:       General: Obese built, not in obvious distress HENT:   No scleral pallor or icterus noted. Oral mucosa is moist.  Chest:  Clear breath sounds.  Diminished breath sounds bilaterally. No crackles or wheezes.  CVS: S1 &S2 heard. No murmur.  Regular rate and rhythm. Abdomen: Soft, nontender, nondistended.  Bowel sounds are heard.   Extremities: No cyanosis, clubbing or edema.  Decreased sensation and strength in the left lower extremity. Psych: Alert, awake and oriented, normal mood CNS:   Left lower extremity weakness. Skin: Warm and dry.  No rashes  noted.  Data Personally reviewed:  CBC: Recent Labs  Lab 03/29/22 0210 03/29/22 0820 03/30/22 0052 03/31/22 0301 04/01/22 0347 04/02/22 0327  WBC 6.2  --  9.0 7.3 7.6 7.7  NEUTROABS 3.4  --   --   --   --   --   HGB 12.8 13.2 12.0 12.6 11.9* 12.1  HCT 37.7 38.5 34.6* 36.5 34.2* 36.0  MCV 94.5  --  95.6 93.6 94.0 95.2  PLT 212  --  171 186 185 204   Basic Metabolic Panel: Recent Labs  Lab 03/29/22 0210 03/30/22 0052 03/30/22 1750 03/31/22 0301 04/01/22 0347 04/02/22 0327  NA 137 136  --  139 136 138  K 3.8 4.1  --  3.5 4.0 3.7  CL 104 106  --  107 104 107  CO2 23 25  --  GLUCOSE 394* 379*  --  188* 263* 154*  BUN 16 13  --  7 20 23*  CREATININE 0.91 0.81  --  0.72 0.90 1.02*  CALCIUM 9.2 8.3*  --  8.8* 8.9 9.3  MG  --   --  2.0  --   --   --   PHOS  --   --  3.6  --   --   --    GFR: Estimated Creatinine Clearance: 67.7 mL/min (A) (by C-G formula based on SCr of 1.02 mg/dL (H)). Liver Function Tests: No results for input(s): AST, ALT, ALKPHOS, BILITOT, PROT, ALBUMIN in the last 168 hours. No results for input(s): LIPASE, AMYLASE in the last 168 hours. No results for input(s): AMMONIA in the last 168 hours. Coagulation Profile: No results for input(s): INR, PROTIME in the last 168 hours. Cardiac Enzymes: No results for input(s): CKTOTAL, CKMB, CKMBINDEX, TROPONINI in the last 168 hours. BNP (last 3 results) No results for input(s): PROBNP in the last 8760 hours. HbA1C: Recent Labs    03/31/22 0301  HGBA1C 12.8*   CBG: Recent Labs  Lab 04/01/22 0746 04/01/22 1137 04/01/22 1607 04/01/22 2125 04/02/22 0557  GLUCAP 269* 225* 263* 135* 168*   Lipid Profile: Recent Labs    03/31/22 0301  CHOL 254*  HDL 42  LDLCALC 173*  TRIG 194*  CHOLHDL 6.0   Thyroid Function Tests: No results for input(s): TSH, T4TOTAL, FREET4, T3FREE, THYROIDAB in the last 72 hours. Anemia Panel: No results for input(s): VITAMINB12, FOLATE, FERRITIN, TIBC, IRON,  RETICCTPCT in the last 72 hours. Urine analysis:    Component Value Date/Time   COLORURINE YELLOW 11/15/2019 0426   APPEARANCEUR CLEAR 11/15/2019 0426   LABSPEC >=1.030 03/16/2021 1640   PHURINE 5.5 03/16/2021 1640   GLUCOSEU 100 (A) 03/16/2021 1640   HGBUR NEGATIVE 03/16/2021 1640   BILIRUBINUR NEGATIVE 03/16/2021 1640   KETONESUR NEGATIVE 03/16/2021 1640   PROTEINUR NEGATIVE 03/16/2021 1640   UROBILINOGEN 0.2 03/16/2021 1640   NITRITE NEGATIVE 03/16/2021 1640   LEUKOCYTESUR SMALL (A) 03/16/2021 1640   Recent Results (from the past 240 hour(s))  MRSA Next Gen by PCR, Nasal     Status: Abnormal   Collection Time: 03/29/22  4:59 PM   Specimen: Nasal Mucosa; Nasal  Swab  Result Value Ref Range Status   MRSA by PCR Next Gen DETECTED (A) NOT DETECTED Final    Comment: RESULT CALLED TO, READ BACK BY AND VERIFIED WITH: S,KING RN @1920  03/29/22 E,[place] (NOTE) The GeneXpert MRSA Assay (FDA approved for NASAL specimens only), is one component of a comprehensive MRSA colonization surveillance program. It is not intended to diagnose MRSA infection nor to guide or monitor treatment for MRSA infections. Test performance is not FDA approved in patients less than 58 years old. Performed at [facility], [address] [place], [place] [address]      MR BRAIN WO CONTRAST  Result Date: 03/31/2022 CLINICAL DATA:  Stroke follow-up scan.  Headache. EXAM: MRI HEAD WITHOUT CONTRAST TECHNIQUE: Multiplanar, multiecho pulse sequences of the brain and surrounding structures were obtained without intravenous contrast. COMPARISON:  Head CT from yesterday FINDINGS: Brain: Subcentimeter acute infarcts in the right temporal, right occipital, and bifrontal cortex. Acute lacunar infarct in the right thalamus. Mild chronic vessel ischemia hemispheric white matter. Brain volume is normal. No hemorrhage, hydrocephalus, or collection. Vascular: Major flow voids are preserved Skull and upper cervical spine: No  focal marrow lesion Sinuses/Orbits: No acute finding IMPRESSION: 1. Small scattered acute infarcts in the cerebral cortex and right thalamus, pattern suggesting central embolic disease. 2. Mild chronic small vessel ischemia. Electronically Signed   By: Tiburcio PeaJonathan  Watts M.D.   On: 03/31/2022 11:33   VAS US UPPER EXTREMITY ARTERIAL DUPLEX  Result Date: 03/31/2022  UPPER EXTREMITY DUPLEX STUDY Patient Name:  [name]  Date of Exam:   03/31/2022 Medical Rec #: [id number]       Accession #:    [id number] Date of Birth: 1966-09-02      Patient Gender: F Patient Age:   [age] years Exam [facility]  [facility] Procedure:      VAS US UPPER EXTREMITY ARTERIAL DUPLEX Referring Phys: --------------------------------------------------------------------------------  Indications: Pain, swelling, bruising. History:     Patient has a history of catheterization via right radial artery.  Risk Factors: Hypertension, hyperlipidemia, Diabetes, no history of smoking,               prior MI, coronary artery disease, prior CVA. Comparison Study: No prior studies. Performing Technologist: Jean Rosenthalachel Hodge RDMS, RVT  Examination Guidelines: A complete evaluation includes B-mode imaging, spectral Doppler, color Doppler, and power Doppler as needed of all accessible portions of each vessel. Bilateral testing is considered an integral part of a complete examination. Limited examinations for reoccurring indications may be performed as noted.  Right Doppler Findings: +---------------+----------+---------+--------+--------+ Site           PSV (cm/s)Waveform StenosisComments +---------------+----------+---------+--------+--------+ Subclavian Prox131       triphasic                 +---------------+----------+---------+--------+--------+ Subclavian Mid 102       triphasic                 +---------------+----------+---------+--------+--------+ Subclavian Dist121       triphasic                  +---------------+----------+---------+--------+--------+ Axillary       137       triphasic                 +---------------+----------+---------+--------+--------+ Brachial Prox  116       triphasic                 +---------------+----------+---------+--------+--------+ Brachial Mid  112       triphasic                 +---------------+----------+---------+--------+--------+ Brachial Dist  116       triphasic                 +---------------+----------+---------+--------+--------+ Radial Prox    68        triphasic                 +---------------+----------+---------+--------+--------+ Radial Mid     76        triphasic                 +---------------+----------+---------+--------+--------+ Radial Dist    49        triphasic                 +---------------+----------+---------+--------+--------+ Ulnar Prox     47        triphasic                 +---------------+----------+---------+--------+--------+ Ulnar Mid      77        triphasic                 +---------------+----------+---------+--------+--------+ Ulnar Dist     48        triphasic                 +---------------+----------+---------+--------+--------+ Palmar Arch    62        triphasic                 +---------------+----------+---------+--------+--------+     Summary:  Right: No obstruction visualized in the right upper extremity. *See table(s) above for measurements and observations. Electronically signed by Lemar Livings MD on 03/31/2022 at 12:57:32 PM.    Final    VAS [place] UPPER EXTREMITY VENOUS DUPLEX  Result Date: 03/31/2022 UPPER VENOUS STUDY  Patient Name:  [name]  Date of Exam:   03/31/2022 Medical Rec #: [id number]       Accession #:    [id number] Date of Birth: 1965/12/06      Patient Gender: F Patient Age:   28 years Exam Location:  [facility] Procedure:      VAS [place] UPPER EXTREMITY VENOUS DUPLEX Referring Phys: Scheryl Marten XU  --------------------------------------------------------------------------------  Indications: Swelling, pain, bruising Other Indications: S/P radial catheterization. Comparison Study: No prior studies. Performing Technologist: Jean Rosenthal RDMS, RVT  Examination Guidelines: A complete evaluation includes B-mode imaging, spectral Doppler, color Doppler, and power Doppler as needed of all accessible portions of each vessel. Bilateral testing is considered an integral part of a complete examination. Limited examinations for reoccurring indications may be performed as noted.  Right Findings: +----------+------------+---------+-----------+----------+-------+ RIGHT     CompressiblePhasicitySpontaneousPropertiesSummary +----------+------------+---------+-----------+----------+-------+ IJV           Full       Yes       Yes                      +----------+------------+---------+-----------+----------+-------+ Subclavian               Yes       Yes                      +----------+------------+---------+-----------+----------+-------+ Axillary      Full       Yes       Yes                      +----------+------------+---------+-----------+----------+-------+  Brachial      Full                                          +----------+------------+---------+-----------+----------+-------+ Radial        Full                                          +----------+------------+---------+-----------+----------+-------+ Ulnar         Full                                          +----------+------------+---------+-----------+----------+-------+ Cephalic      Full                                          +----------+------------+---------+-----------+----------+-------+ Basilic       Full                                          +----------+------------+---------+-----------+----------+-------+  Left Findings:  +----------+------------+---------+-----------+----------+-------+ LEFT      CompressiblePhasicitySpontaneousPropertiesSummary +----------+------------+---------+-----------+----------+-------+ Subclavian               Yes       Yes                      +----------+------------+---------+-----------+----------+-------+  Summary:  Right: No evidence of deep vein thrombosis in the upper extremity. No evidence of superficial vein thrombosis in the upper extremity.  Left: No evidence of thrombosis in the subclavian.  *See table(s) above for measurements and observations.  Diagnosing physician: Lemar Livings MD Electronically signed by Lemar Livings MD on 03/31/2022 at 12:57:24 PM.    Final     Family Communication: Spoke with the patient at bedside.  Disposition: Status is: Inpatient awaiting CIR disposition pending TEE/loop recorder possible 5/31.   Planned Discharge Destination: Rehab  Joycelyn Das, MD 04/02/2022 10:09 AM Page by [email or web address]

## 2022-04-02 NOTE — Transfer of Care (Signed)
Immediate Anesthesia Transfer of Care Note  Patient: [name]  Procedure(s) Performed: TRANSESOPHAGEAL ECHOCARDIOGRAM (TEE) BUBBLE STUDY  Patient Location: Short Stay  Anesthesia Type:General  Level of Consciousness: drowsy  Airway & Oxygen Therapy: Patient Spontanous Breathing and Patient connected to nasal cannula oxygen  Post-op Assessment: Report given to RN and Post -op Vital signs reviewed and stable  Post vital signs: Reviewed and stable  Last Vitals:  Vitals Value Taken Time  BP 124/82 04/02/22 1143  Temp    Pulse 49 04/02/22 1145  Resp 17 04/02/22 1145  SpO2 93 % 04/02/22 1145  Vitals shown include unvalidated device data.  Last Pain:  Vitals:   04/02/22 1014  TempSrc: Temporal  PainSc:       Patients Stated Pain Goal: 3 (03/31/22 2230)  Complications: No notable events documented.

## 2022-04-02 NOTE — Progress Notes (Signed)
Progress Note  Patient Name: [name] Date of Encounter: 04/02/2022  [facility] Cardiologist: Schrupp Ishikawa, MD   Subjective   No acute events overnight.  L arm function better.  L leg remains weak.  No other issues.  TEE negative.      Inpatient Medications    Scheduled Meds:  carvedilol  25 mg Oral BID   Chlorhexidine Gluconate Cloth  6 each Topical Daily   clopidogrel  75 mg Oral Daily   empagliflozin  10 mg Oral Daily   insulin aspart  0-20 Units Subcutaneous TID WC   insulin aspart  0-5 Units Subcutaneous QHS   insulin aspart  6 Units Subcutaneous TID WC   insulin glargine-yfgn  14 Units Subcutaneous BID   losartan  12.5 mg Oral QHS   mupirocin ointment  1 application. Nasal BID   pantoprazole  40 mg Oral QHS   rosuvastatin  40 mg Oral Daily   senna-docusate  1 tablet Oral BID   sodium chloride flush  3 mL Intravenous Q12H   sodium chloride flush  3 mL Intravenous Q12H   Continuous Infusions:  sodium chloride Stopped (03/30/22 1820)   sodium chloride 100 mL/hr at 04/02/22 1119   sodium chloride     PRN Meds: sodium chloride, acetaminophen **OR** acetaminophen (TYLENOL) oral liquid 160 mg/5 mL **OR** acetaminophen, albuterol, diphenhydrAMINE **OR** diphenhydrAMINE, labetalol, nitroGLYCERIN, ondansetron (ZOFRAN) IV, sodium chloride flush   Vital Signs    Vitals:   04/02/22 1208 04/02/22 1224 04/02/22 1315 04/02/22 1538  BP: 119/64 129/69 (!) 130/52 110/67  Pulse: 60  78 70  Resp: 18  13   Temp:    98 F (36.7 C)  TempSrc:    Oral  SpO2: 97% 97% 100% 99%  Weight:      Height:        Intake/Output Summary (Last 24 hours) at 04/02/2022 1822 Last data filed at 04/02/2022 1600 Gross per 24 hour  Intake 543 ml  Output --  Net 543 ml      04/02/2022   10:14 AM 03/29/2022    6:00 AM 01/28/2021    9:25 AM  Last 3 Weights  Weight (lbs) 199 lb 15.3 oz 200 lb 204 lb 3.2 oz  Weight (kg) 90.7 kg 90.719 kg 92.625 kg      Telemetry     NSR - Personally Reviewed  ECG    EKG none today  Physical Exam   GEN: No acute distress.   Neck: No JVD Cardiac: RRR, no murmurs, rubs, or gallops.  Respiratory: Clear to auscultation bilaterally. GI: Soft, nontender, non-distended  MS: No edema; No deformity. R forearm moderate hematoma Neuro:  LLE weakness Psych: Normal affect   Labs    High Sensitivity Troponin:   Recent Labs  Lab 03/29/22 0820 03/29/22 1002 03/29/22 1634 03/30/22 0350 03/30/22 0540  TROPONINIHS 87* 134* 302* 184* 162*     Chemistry Recent Labs  Lab 03/30/22 1750 03/31/22 0301 04/01/22 0347 04/02/22 0327  NA  --  139 136 138  K  --  3.5 4.0 3.7  CL  --  107 104 107  CO2  --  26 25 25   GLUCOSE  --  188* 263* 154*  BUN  --  7 20 23*  CREATININE  --  0.72 0.90 1.02*  CALCIUM  --  8.8* 8.9 9.3  MG 2.0  --   --   --   GFRNONAA  --  >60 >60 >60  ANIONGAP  --  6 7 6     Lipids  Recent Labs  Lab 03/31/22 0301  CHOL 254*  TRIG 194*  HDL 42  LDLCALC 173*  CHOLHDL 6.0    Hematology Recent Labs  Lab 03/31/22 0301 04/01/22 0347 04/02/22 0327  WBC 7.3 7.6 7.7  RBC 3.90 3.64* 3.78*  HGB 12.6 11.9* 12.1  HCT 36.5 34.2* 36.0  MCV 93.6 94.0 95.2  MCH 32.3 32.7 32.0  MCHC 34.5 34.8 33.6  RDW 11.9 12.0 12.2  PLT 186 185 204   Thyroid No results for input(s): TSH, FREET4 in the last 168 hours.  BNPNo results for input(s): BNP, PROBNP in the last 168 hours.  DDimer  Recent Labs  Lab 03/29/22 0820  DDIMER 0.29     Radiology    ECHO TEE  Result Date: 04/02/2022    TRANSESOPHOGEAL ECHO REPORT   Patient Name:   [name] Date of Exam: 04/02/2022 Medical Rec #:  [id number]      Height:       65.0 in Accession #:    [id number]     Weight:       200.0 lb Date of Birth:  1966-08-31     BSA:          1.978 m Patient Age:    56 years       BP:           124/58 mmHg Patient Gender: F              HR:           76 bpm. Exam Location:  Inpatient Procedure: 3D Echo Indications:     Cerebral  Infarction, unspecified I63.9  History:         Patient has prior history of Echocardiogram examinations, most                  recent 03/25/2022. CAD and NSTEMI; Risk Factors:Hypertension,                  Diabetes and Dyslipidemia. Sarcoidosis.  Sonographer:     Darlina Sicilian RDCS Referring Phys:  [id number] RHONDA G BARRETT Diagnosing Phys: Eleonore Chiquito MD PROCEDURE: After discussion of the risks and benefits of a TEE, an informed consent was obtained from the patient. TEE procedure time was 7 minutes. The transesophogeal probe was passed without difficulty through the esophogus of the patient. Imaged were  obtained with the patient in a left lateral decubitus position. Sedation performed by different physician. The patient was monitored while under deep sedation. Anesthestetic sedation was provided intravenously by Anesthesiology: 300mg  of Propofol, 100mg   of Lidocaine. Image quality was excellent. The patient's vital signs; including heart rate, blood pressure, and oxygen saturation; remained stable throughout the procedure. The patient developed no complications during the procedure. IMPRESSIONS  1. Left ventricular ejection fraction, by estimation, is 60 to 65%. The left ventricle has normal function.  2. Right ventricular systolic function is normal. The right ventricular size is normal.  3. No left atrial/left atrial appendage thrombus was detected. The LAA emptying velocity was 76 cm/s.  4. The mitral valve is grossly normal. Trivial mitral valve regurgitation. No evidence of mitral stenosis.  5. The aortic valve is tricuspid. Aortic valve regurgitation is mild. No aortic stenosis is present.  6. Agitated saline contrast bubble study was negative, with no evidence of any interatrial shunt. Conclusion(s)/Recommendation(s): No LA/LAA thrombus identified. Negative bubble study for interatrial shunt. No intracardiac source of embolism detected on this  on this transesophageal echocardiogram. FINDINGS  Left  Ventricle: Left ventricular ejection fraction, by estimation, is 60 to 65%. The left ventricle has normal function. The left ventricular internal cavity size was normal in size. Right Ventricle: The right ventricular size is normal. No increase in right ventricular wall thickness. Right ventricular systolic function is normal. Left Atrium: Left atrial size was normal in size. No left atrial/left atrial appendage thrombus was detected. The LAA emptying velocity was 76 cm/s. Right Atrium: Right atrial size was normal in size. Pericardium: Trivial pericardial effusion is present. Mitral Valve: The mitral valve is grossly normal. Trivial mitral valve regurgitation. No evidence of mitral valve stenosis. Tricuspid Valve: The tricuspid valve is grossly normal. Tricuspid valve regurgitation is mild . No evidence of tricuspid stenosis. Aortic Valve: The aortic valve is tricuspid. Aortic valve regurgitation is mild. No aortic stenosis is present. Pulmonic Valve: The pulmonic valve was grossly normal. Pulmonic valve regurgitation is trivial. No evidence of pulmonic stenosis. Aorta: The aortic root and ascending aorta are structurally normal, with no evidence of dilitation. There is minimal (Grade I) layered plaque involving the descending aorta. Venous: The left upper pulmonary vein, left lower pulmonary vein, right lower pulmonary vein and right upper pulmonary vein are normal. IAS/Shunts: No atrial level shunt detected by color flow Doppler. Agitated saline contrast was given intravenously to evaluate for intracardiac shunting. Agitated saline contrast bubble study was negative, with no evidence of any interatrial shunt.   AORTA Ao Root diam: 3.60 cm Ao Asc diam:  3.56 cm TRICUSPID VALVE TR Peak grad:   16.6 mmHg TR Vmax:        204.00 cm/s Eleonore Chiquito MD Electronically signed by Eleonore Chiquito MD Signature Date/Time: 04/02/2022/11:58:59 AM    Final     Cardiac Studies   LHC 03/29/22:   RPDA-2 lesion is 99% stenosed.    RPDA-1 lesion is 40% stenosed.   2nd Diag lesion is 80% stenosed.   Previously placed Mid Cx stent (unknown type) is  widely patent.   A stent was successfully placed.   Post intervention, there is a 0% residual stenosis.   LV end diastolic pressure is normal.   1.  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 2.  High-grade second diagonal lesion which should be treated medically due to proximity to LAD. 3.  LVEDP of 11 mmHg   The results were reviewed with Dr. Johney Frame.   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    Patient Profile     56 y.o. female with history of known CAD s/p PCI to Lcx and PDA in 10/2019 at [facility] hospital, HTN, HLD, DMII, and prior history of PE who presented to the ER with chest burning radiating to her back found to have elevated troponin for which Cardiology was consulted.  Assessment & Plan    #Acute CVA Sudden left-sided weakness on 5/28, given tPA and admitted to neuro ICU.  Head CT unremarkable, brain MRI with small scattered acute infarcts.  Continue plavix monotherapy due to ASA allergy; TEE negative.  Etiology likely as a complication of PCI procedure.  Further evaluation per neurology.   #NSTEMI: #CAD: S/p PCI of PDA due to ISR.  Cont plavix, crestor, coreg for now.  Will need regimen optimized at outpatient.  #HTN: BP under good control, cont losartan.  #HLD: Cont crestor for now.  #DMII: Very poorly controlled. A1C 12.5. Cont plavix  in lieu of ASA, crestor, jardiance, and losartan.     For questions or updates, please contact [place] Please consult [email or web address] for contact info under        Signed, Early Osmond, MD  04/02/2022, 6:22 PM   Patient ID: [name], female   DOB: 01/06/1966, 56 y.o.   MRN: [id number]

## 2022-04-02 NOTE — Progress Notes (Signed)
  Echocardiogram Echocardiogram Transesophageal has been performed.  [name] M 04/02/2022, 11:55 AM

## 2022-04-02 NOTE — Progress Notes (Addendum)
Discussed with pt MI, stent, importance of Plavix, NTG, and CRPII. Gave her diet sheets to read however did not discuss due OT coming to work with pt. Will refer to [place] CRPII for "down the road" after CVA recovery. [id number] Ethelda Chick CES, ACSM 04/02/2022 2:30 PM

## 2022-04-02 NOTE — PMR Pre-admission (Shared)
PMR Admission Coordinator Pre-Admission Assessment  Patient: [name] is an 56 y.o., female MRN: [id number] DOB: November 03, 1966 Height: '5\' 5"'  (165.1 cm) Weight: 90.7 kg  Insurance Information HMO:     PPO:      PCP:      IPA:      80/20:      OTHER:  PRIMARY:none   SECONDARY: none   Financial Counselor:       Phone#:   The Therapist, art Information Summary" for patients in Inpatient Rehabilitation Facilities with attached "Privacy Act [place] Records" was provided and verbally reviewed with: n/a  Emergency Contact Information Contact Information     Name Relation Home Work Mobile   [place] Brother   [phone]   Callia, Swim   [phone]       Current Medical History  Patient Admitting Diagnosis: CVA History of Present Illness: [name] is a 56 y.o. female with medical history significant of hypertension, CAD s/p stents, DM type II, sarcoidosis, and obesity who presented to the ED 03/29/22  with complaints of chest pain. Pain has been present now for approximately 3 weeks.    She had been seen in urgent care 6 days PTA  but symptoms were thought to be atypical in nature likely related to acid reflux.  She was prescribed famotidine 20 mg twice daily, but was not able to pick up the prescription due to lack of funds at that time. She previously had NSTEMI with stents placed in the circumflex and posterior descending artery in December 2020.  Patient reported having a burning sensation all over when that occurred and not just in her chest.  She does not report being on any medication for her diabetes. Upon admission into the emergency department patient was seen to be afebrile with pulse 55-70, blood pressures 105/61-182/84, and all other vital signs maintained.  Labs significant for HS troponin 15 -> 44 and glucose 394 without elevated anion gap.  Chest x-ray was otherwise noted to be clear. EKG significant for sinus bradycardia 58 bpm with signs of  LVH thought to be similar to previous tracing.  Patient had been given a GI cocktail, Protonix 40 mg IV, and started on heparin drip.  PT/OT/SLP were consulted and recommended CIR to assist return to PLOF.  Complete NIHSS TOTAL: 4  Patient's medical record from [facility] has been reviewed by the rehabilitation admission coordinator and physician.  Past Medical History  Past Medical History:  Diagnosis Date   Anxiety    Coronary artery disease    Diabetes mellitus    Embolism ([place])    Hypertension    Sarcoidosis     Has the patient had major surgery during 100 days prior to admission? No  Family History   family history includes Cancer in her maternal grandmother; Diabetes in her maternal grandmother; Healthy in her mother; Heart disease in her maternal grandmother; Tuberculosis in her maternal grandfather.  Current Medications  Current Facility-Administered Medications:    0.9 %  sodium chloride infusion, 250 mL, Intravenous, PRN, O'Neal, Cassie Freer, MD, Stopped at 03/30/22 1820   0.9 %  sodium chloride infusion, , Intravenous, Continuous, O'Neal, Cassie Freer, MD, Last Rate: 100 mL/hr at 04/02/22 1119, Restarted at 04/02/22 1137   0.9 %  sodium chloride infusion, , Intravenous, Continuous, Barrett, Rhonda G, PA-C   acetaminophen (TYLENOL) tablet 650 mg, 650 mg, Oral, Q4H PRN, 650 mg at 04/02/22 0928 **OR** acetaminophen (TYLENOL) 160 MG/5ML solution 650 mg, 650  mg, Per Tube, Q4H PRN **OR** acetaminophen (TYLENOL) suppository 650 mg, 650 mg, Rectal, Q4H PRN, O'Neal, Cassie Freer, MD   albuterol (PROVENTIL) (2.5 MG/3ML) 0.083% nebulizer solution 2.5 mg, 2.5 mg, Nebulization, Q6H PRN, O'Neal, Cassie Freer, MD   carvedilol (COREG) tablet 25 mg, 25 mg, Oral, BID, O'Neal, Cassie Freer, MD, 25 mg at 04/02/22 5643   Chlorhexidine Gluconate Cloth 2 % PADS 6 each, 6 each, Topical, Daily, O'Neal, Cassie Freer, MD, 6 each at 04/02/22 0900   clopidogrel (PLAVIX) tablet  75 mg, 75 mg, Oral, Daily, O'Neal, Cassie Freer, MD, 75 mg at 04/02/22 3295   diphenhydrAMINE (BENADRYL) capsule 25 mg, 25 mg, Oral, Q6H PRN, 25 mg at 04/01/22 2001 **OR** diphenhydrAMINE (BENADRYL) injection 12.5 mg, 12.5 mg, Intravenous, Q6H PRN, O'Neal, Cassie Freer, MD   empagliflozin (JARDIANCE) tablet 10 mg, 10 mg, Oral, Daily, O'Neal, Cassie Freer, MD, 10 mg at 04/02/22 0929   insulin aspart (novoLOG) injection 0-20 Units, 0-20 Units, Subcutaneous, TID WC, O'Neal, Cassie Freer, MD, 4 Units at 04/02/22 0604   insulin aspart (novoLOG) injection 0-5 Units, 0-5 Units, Subcutaneous, QHS, O'Neal, Cassie Freer, MD   insulin aspart (novoLOG) injection 6 Units, 6 Units, Subcutaneous, TID WC, O'Neal, Cassie Freer, MD, 6 Units at 04/01/22 1622   insulin glargine-yfgn (SEMGLEE) injection 14 Units, 14 Units, Subcutaneous, BID, O'Neal, Cassie Freer, MD, 14 Units at 04/02/22 0931   labetalol (NORMODYNE) injection 10 mg, 10 mg, Intravenous, Q10 min PRN, O'Neal, Cassie Freer, MD, 10 mg at 03/30/22 1500   losartan (COZAAR) tablet 12.5 mg, 12.5 mg, Oral, QHS, O'Neal, Cassie Freer, MD, 12.5 mg at 04/01/22 2318   mupirocin ointment (BACTROBAN) 2 % 1 application., 1 application., Nasal, BID, O'Neal, Cassie Freer, MD, 1 application. at 04/02/22 0931   nitroGLYCERIN (NITROSTAT) SL tablet 0.4 mg, 0.4 mg, Sublingual, Q5 min PRN, O'Neal, Cassie Freer, MD, 0.4 mg at 03/30/22 1649   ondansetron (ZOFRAN) injection 4 mg, 4 mg, Intravenous, Q6H PRN, O'Neal, Cassie Freer, MD   pantoprazole (PROTONIX) EC tablet 40 mg, 40 mg, Oral, QHS, O'Neal, Cassie Freer, MD, 40 mg at 04/01/22 2317   rosuvastatin (CRESTOR) tablet 40 mg, 40 mg, Oral, Daily, O'Neal, Cassie Freer, MD, 40 mg at 04/02/22 1884   senna-docusate (Senokot-S) tablet 1 tablet, 1 tablet, Oral, BID, O'Neal, Cassie Freer, MD, 1 tablet at 04/01/22 2318   sodium chloride flush (NS) 0.9 % injection 3 mL, 3 mL, Intravenous, Q12H, O'Neal, Cassie Freer, MD, 3 mL  at 04/02/22 0930   sodium chloride flush (NS) 0.9 % injection 3 mL, 3 mL, Intravenous, Q12H, O'Neal, Cassie Freer, MD, 3 mL at 04/02/22 0930   sodium chloride flush (NS) 0.9 % injection 3 mL, 3 mL, Intravenous, PRN, O'Neal, Cassie Freer, MD  Patients Current Diet:  Diet Order             Diet heart healthy/carb modified Room service appropriate? Yes; Fluid consistency: Thin  Diet effective now                   Precautions / Restrictions Precautions Precautions: Fall Precaution Comments: 5/27 radial heart cath Restrictions Weight Bearing Restrictions: No   Has the patient had 2 or more falls or a fall with injury in the past year? No  Prior Activity Level Community (5-7x/wk): Pt. active in the community PTA  Prior Functional Level Self Care: Did the patient need help bathing, dressing, using the toilet or eating? Independent  Indoor Mobility: Did the patient need assistance with walking from  room to room (with or without device)? Independent  Stairs: Did the patient need assistance with internal or external stairs (with or without device)? Independent  Functional Cognition: Did the patient need help planning regular tasks such as shopping or remembering to take medications? Independent  Patient Information Are you of Hispanic, Latino/a,or Spanish origin?: A. No, not of Hispanic, Latino/a, or Spanish origin What is your race?: B. Black or African American Do you need or want an interpreter to communicate with a doctor or health care staff?: 0. No  Patient's Response To:  Health Literacy and Transportation Is the patient able to respond to health literacy and transportation needs?: Yes Health Literacy - How often do you need to have someone help you when you read instructions, pamphlets, or other written material from your doctor or pharmacy?: Never In the past 12 months, has lack of transportation kept you from medical appointments or from getting medications?: No In  the past 12 months, has lack of transportation kept you from meetings, work, or from getting things needed for daily living?: No  Home Assistive Devices / Equipment Home Equipment: None  Prior Device Use: Indicate devices/aids used by the patient prior to current illness, exacerbation or injury? None of the above  Current Functional Level Cognition  Arousal/Alertness: Lethargic Overall Cognitive Status: Impaired/Different from baseline Orientation Level: Oriented X4 Following Commands: Follows one step commands consistently Safety/Judgement: Decreased awareness of safety, Decreased awareness of deficits Attention: Sustained Sustained Attention: Impaired Sustained Attention Impairment: Verbal basic Awareness: Impaired Awareness Impairment: Intellectual impairment Problem Solving: Impaired Problem Solving Impairment: Functional basic    Extremity Assessment (includes Sensation/Coordination)  Upper Extremity Assessment: Defer to OT evaluation RUE Deficits / Details: painful from IVs; LUE Deficits / Details: can actively raise arm to ~45 degrees shoulder flexion--PROM full. Once shoulder at full flexion she can hold it there and slowly lower arm back down beside her LUE Sensation: decreased light touch LUE Coordination: decreased fine motor, decreased gross motor  Lower Extremity Assessment: RLE deficits/detail, LLE deficits/detail RLE Deficits / Details: AROM/strength WFL LLE Deficits / Details: PROM hip flexion WFL, ankle DF limited and stiff with no active movement at ankle; knee stuck in extension, able to flex seated EOB with deep manual pressure over time into quads to approx 20 degrees. LLE Sensation: decreased light touch (absent to light touch and pain)    ADLs  Overall ADL's : Needs assistance/impaired Eating/Feeding: Sitting, Minimal assistance Grooming: Moderate assistance, Sitting Upper Body Bathing: Moderate assistance, Sitting Lower Body Bathing: Total assistance,  Bed level Upper Body Dressing : Maximal assistance, Sitting Lower Body Dressing: Total assistance, Bed level    Mobility  Overal bed mobility: Needs Assistance Bed Mobility: Rolling, Sidelying to Sit Rolling: Min assist Sidelying to sit: Mod assist Supine to sit: Mod assist, +2 for physical assistance Sit to supine: Max assist, +2 for physical assistance General bed mobility comments: physical assist to bend left knee in bed to roll and cues to hook RLE under LLE to assist with scooting legs off bed. assist for transition to sitting, cues for scooting    Transfers  Overall transfer level: Needs assistance Transfers: Sit to/from Stand Sit to Stand: Min assist Bed to/from chair/wheelchair/BSC transfer type:: Stand pivot Stand pivot transfers: Mod assist, Max assist, +2 safety/equipment General transfer comment: min assist to rise from bed and chair after LLE positioned on floor x 5 trials. Stand pivot with pt placing bil UE on therapist arms with face to face technique to pivot from  recliner<>BSC with cues for hand placement, weight shift and technique. step pivot with face to face technique with side stepping bed to recliner    Ambulation / Gait / Stairs / Wheelchair Mobility  Ambulation/Gait Ambulation/Gait assistance: Herbalist (Feet): 10 Feet Assistive device: Rolling walker (2 wheels) Gait Pattern/deviations: Step-to pattern General Gait Details: pt with LLE extensor tone allowing weight bearing and pt with multimodal cues to weight shift and advance LLE,very short steps but pt able to move LLE for limited distance. Reliant on RW for support and limited by fatigue Gait velocity interpretation: <1.31 ft/sec, indicative of household ambulator    Posture / Balance Dynamic Sitting Balance Sitting balance - Comments: EOB without physical assist with assist to place left foot on the ground Balance Overall balance assessment: Needs assistance Sitting balance-Leahy Scale:  Fair Sitting balance - Comments: EOB without physical assist with assist to place left foot on the ground Standing balance-Leahy Scale: Poor Standing balance comment: bil UE supported in standing    Special needs/care consideration Skin intact   Previous Home Environment (from acute therapy documentation) Living Arrangements: Other relatives (sister who is blind and pt takes care of)  Lives With: Spouse Available Help at Discharge: Family, Available PRN/intermittently Type of Home: House Home Layout: Two level, Bed/bath upstairs Alternate Level Stairs-Rails: Right, Left Alternate Level Stairs-Number of Steps: 14 Home Access: Level entry Entrance Stairs-Number of Steps: 1 Bathroom Shower/Tub: Chiropodist: Standard Bathroom Accessibility: Yes How Accessible: Accessible via walker  Discharge Living Setting Plans for Discharge Living Setting: Patient's home Type of Home at Discharge: Apartment Discharge Home Layout: One level, Two level Alternate Level Stairs-Rails: Right, Left, Can reach both Alternate Level Stairs-Number of Steps: 14 Discharge Home Access: Level entry Entrance Stairs-Rails: Right, Left, Can reach both Discharge Bathroom Shower/Tub: Tub/shower unit Discharge Bathroom Toilet: Standard Discharge Bathroom Accessibility: Yes How Accessible: Accessible via wheelchair  Social/Family/Support Systems Patient Roles: Other (Comment) Contact Information: Mother Wynn Maudlin Anticipated Caregiver: [phone] Anticipated Caregiver's Contact Information: Supervision to light assist Caregiver Availability: 24/7  Goals Patient/Family Goal for Rehab: PT/OT/SLP Supervision Expected length of stay: 7-10 days Pt/Family Agrees to Admission and willing to participate: Yes Program Orientation Provided & Reviewed with Pt/Caregiver Including Roles  & Responsibilities: Yes  Decrease burden of Care through IP rehab admission: Specialzed equipment needs,  Decrease number of caregivers, Bowel and bladder program, and Patient/family education  Possible need for SNF placement upon discharge: not anticipated   Patient Condition: I have reviewed medical records from [facility] , spoken with CM, and patient and daughter. I met with patient at the bedside for inpatient rehabilitation assessment.  Patient will benefit from ongoing PT, OT, and SLP, can actively participate in 3 hours of therapy a day 5 days of the week, and can make measurable gains during the admission.  Patient will also benefit from the coordinated team approach during an Inpatient Acute Rehabilitation admission.  The patient will receive intensive therapy as well as Rehabilitation physician, nursing, social worker, and care management interventions.  Due to safety, disease management, medication administration, pain management, and patient education the patient requires 24 hour a day rehabilitation nursing.  The patient is currently *** with mobility and basic ADLs.  Discharge setting and therapy post discharge at home with home health is anticipated.  Patient has agreed to participate in the Acute Inpatient Rehabilitation Program and will admit {Time; today/tomorrow:10263}.  Preadmission Screen Completed By:  Genella Mech, 04/02/2022 3:12 PM ______________________________________________________________________  Discussed status with Dr. Marland Kitchen on *** at *** and received approval for admission today.  Admission Coordinator:  Genella Mech, CCC-SLP, time Marland KitchenSudie Grumbling ***   Assessment/Plan: Diagnosis: Does the need for close, 24 hr/day Medical supervision in concert with the patient's rehab needs make it unreasonable for this patient to be served in a less intensive setting? {yes_no_potentially:3041433} Co-Morbidities requiring supervision/potential complications: *** Due to {due SZ:5612548}, does the patient require 24 hr/day rehab nursing? {yes_no_potentially:3041433} Does  the patient require coordinated care of a physician, rehab nurse, PT, OT, and SLP to address physical and functional deficits in the context of the above medical diagnosis(es)? {yes_no_potentially:3041433} Addressing deficits in the following areas: {deficits:3041436} Can the patient actively participate in an intensive therapy program of at least 3 hrs of therapy 5 days a week? {yes_no_potentially:3041433} The potential for patient to make measurable gains while on inpatient rehab is {potential:3041437} Anticipated functional outcomes upon discharge from inpatient rehab: {functional outcomes:304600100} PT, {functional outcomes:304600100} OT, {functional outcomes:304600100} SLP Estimated rehab length of stay to reach the above functional goals is: *** Anticipated discharge destination: {anticipated dc setting:21604} 10. Overall Rehab/Functional Prognosis: {potential:3041437}   MD Signature: ***

## 2022-04-02 NOTE — Progress Notes (Signed)
SLP Cancellation Note  Patient Details Name: [name] MRN: [id number] DOB: [birth year]   Cancelled treatment:        Pt in TEE. Will continue attempts tomorrow.    Houston Siren 04/02/2022, 10:22 AM

## 2022-04-02 NOTE — Anesthesia Preprocedure Evaluation (Signed)
Anesthesia Evaluation  Patient identified by MRN, date of birth, ID band Patient awake    Reviewed: Allergy & Precautions, NPO status , Patient's Chart, lab work & pertinent test results  Airway Mallampati: II  TM Distance: >3 FB Neck ROM: Full    Dental  (+) Dental Advisory Given   Pulmonary neg pulmonary ROS,    breath sounds clear to auscultation       Cardiovascular hypertension, Pt. on medications and Pt. on home beta blockers + CAD and + Cardiac Stents   Rhythm:Regular Rate:Normal     Neuro/Psych  Neuromuscular disease CVA    GI/Hepatic negative GI ROS, Neg liver ROS,   Endo/Other  diabetes, Type 2  Renal/GU negative Renal ROS     Musculoskeletal   Abdominal   Peds  Hematology negative hematology ROS (+)   Anesthesia Other Findings   Reproductive/Obstetrics                             Anesthesia Physical Anesthesia Plan  ASA: 3  Anesthesia Plan: MAC   Post-op Pain Management: Minimal or no pain anticipated   Induction:   PONV Risk Score and Plan: 2 and Propofol infusion and Treatment may vary due to age or medical condition  Airway Management Planned: Natural Airway and Nasal Cannula  Additional Equipment:   Intra-op Plan:   Post-operative Plan:   Informed Consent: I have reviewed the patients History and Physical, chart, labs and discussed the procedure including the risks, benefits and alternatives for the proposed anesthesia with the patient or authorized representative who has indicated his/her understanding and acceptance.       Plan Discussed with:   Anesthesia Plan Comments:         Anesthesia Quick Evaluation

## 2022-04-02 NOTE — CV Procedure (Addendum)
    TRANSESOPHAGEAL ECHOCARDIOGRAM   NAME:  [name]    MRN: [id number] DOB:  May 12, 1966    ADMIT DATE: 03/29/2022  INDICATIONS: stroke  PROCEDURE:   Informed consent was obtained prior to the procedure. The risks, benefits and alternatives for the procedure were discussed and the patient comprehended these risks.  Risks include, but are not limited to, cough, sore throat, vomiting, nausea, somnolence, esophageal and stomach trauma or perforation, bleeding, low blood pressure, aspiration, pneumonia, infection, trauma to the teeth and death.    Procedural time out performed. The oropharynx was anesthetized with topical 1% benzocaine.    Anesthesia was administered by Dr. Sampson Goon.  The patient was administered 300 mg of propofol and 100 mg of lidocaine to achieve and maintain moderate conscious sedation.  The patient's heart rate, blood pressure, and oxygen saturation are monitored continuously during the procedure. The period of conscious sedation is 7 minutes, of which I was present face-to-face 100% of this time.   The transesophageal probe was inserted in the esophagus and stomach without difficulty and multiple views were obtained.   COMPLICATIONS:    There were no immediate complications.  KEY FINDINGS:  No LA/LAA thrombus.  Negative bubble study.  Normal LV/RV function.  Full report to follow. Further management per primary team.  US guided IV performed by me before the procedure.   Gerri Spore T. Flora Lipps, MD, [facility]  [facility]  [address] [place], [place] [address] [phone]  11:36 AM

## 2022-04-02 NOTE — Progress Notes (Signed)
Physical Therapy Treatment Patient Details Name: [name] MRN: [id number] DOB: 1966/03/13 Today's Date: 04/02/2022   History of Present Illness 56 yo F admitted 5/27 as code STEMI, taken for LHC.  On 5/28 pt had L sided sensory loss. Code stroke activated.  Received tnk. After tnk had oral mucosal bleeding, HA, chest pain, R forearm pain and swelling at site of LHC access. MRI - Small scattered acute infarcts in the cerebral cortex and right thalamus. PMhx: polyneuropathy, obesity, HLD, CAD, DM, HTN    PT Comments    Pt very pleasant and motivated to start progressing mobility and gait. Pt with continued LLE extensor tone but improved ability to "break" into flexion for transition to sitting and standing. Pt able to initiate gait and progress transfers with tone assisting with maintaining standing but impairing stepping. Pt encouraged by progress and AIR remains appropriate.     Recommendations for follow up therapy are one component of a multi-disciplinary discharge planning process, led by the attending physician.  Recommendations may be updated based on patient status, additional functional criteria and insurance authorization.  Follow Up Recommendations  Acute inpatient rehab (3hours/day)     Assistance Recommended at Discharge Frequent or constant Supervision/Assistance  Patient can return home with the following A lot of help with bathing/dressing/bathroom;Assistance with cooking/housework;Assist for transportation;Help with stairs or ramp for entrance;A lot of help with walking and/or transfers   Equipment Recommendations  Rolling walker (2 wheels)    Recommendations for Other Services       Precautions / Restrictions Precautions Precautions: Fall Precaution Comments: 5/27 radial heart cath     Mobility  Bed Mobility Overal bed mobility: Needs Assistance Bed Mobility: Rolling, Sidelying to Sit Rolling: Min assist Sidelying to sit: Mod assist       General bed  mobility comments: physical assist to bend left knee in bed to roll and cues to hook RLE under LLE to assist with scooting legs off bed. assist for transition to sitting, cues for scooting    Transfers Overall transfer level: Needs assistance   Transfers: Sit to/from Stand Sit to Stand: Min assist           General transfer comment: min assist to rise from bed and chair after LLE positioned on floor x 5 trials. Stand pivot with pt placing bil UE on therapist arms with face to face technique to pivot from recliner<>BSC with cues for hand placement, weight shift and technique. step pivot with face to face technique with side stepping bed to recliner    Ambulation/Gait Ambulation/Gait assistance: Min assist Gait Distance (Feet): 10 Feet Assistive device: Rolling walker (2 wheels) Gait Pattern/deviations: Step-to pattern   Gait velocity interpretation: <1.31 ft/sec, indicative of household ambulator   General Gait Details: pt with LLE extensor tone allowing weight bearing and pt with multimodal cues to weight shift and advance LLE,very short steps but pt able to move LLE for limited distance. Reliant on RW for support and limited by fatigue   Stairs             Wheelchair Mobility    Modified Rankin (Stroke Patients Only) Modified Rankin (Stroke Patients Only) Pre-Morbid Rankin Score: No symptoms Modified Rankin: Moderately severe disability     Balance Overall balance assessment: Needs assistance   Sitting balance-Leahy Scale: Fair Sitting balance - Comments: EOB without physical assist with assist to place left foot on the ground     Standing balance-Leahy Scale: Poor Standing balance comment: bil UE supported in  standing                            Cognition Arousal/Alertness: Awake/alert Behavior During Therapy: WFL for tasks assessed/performed Overall Cognitive Status: Impaired/Different from baseline                         Following  Commands: Follows one step commands consistently Safety/Judgement: Decreased awareness of safety, Decreased awareness of deficits   Problem Solving: Requires verbal cues, Requires tactile cues          Exercises General Exercises - Lower Extremity Short Arc Quad: AAROM, Left, Seated, 5 reps (pt able to initiate LLE movement but unable to clear foot from floor or break tone without physical assist to bend for return to floor)    General Comments        Pertinent Vitals/Pain Pain Assessment Pain Assessment: No/denies pain    Home Living                          Prior Function            PT Goals (current goals can now be found in the care plan section) Progress towards PT goals: Progressing toward goals    Frequency    Min 4X/week      PT Plan Current plan remains appropriate    Co-evaluation              AM-PAC PT "6 Clicks" Mobility   Outcome Measure  Help needed turning from your back to your side while in a flat bed without using bedrails?: A [place] Help needed moving from lying on your back to sitting on the side of a flat bed without using bedrails?: A Lot Help needed moving to and from a bed to a chair (including a wheelchair)?: A Lot Help needed standing up from a chair using your arms (e.g., wheelchair or bedside chair)?: A [place] Help needed to walk in hospital room?: A Lot Help needed climbing 3-5 steps with a railing? : Total 6 Click Score: 13    End of Session Equipment Utilized During Treatment: Gait belt Activity Tolerance: Patient tolerated treatment well Patient left: in chair;with call bell/phone within reach;with chair alarm set Nurse Communication: Mobility status PT Visit Diagnosis: Other abnormalities of gait and mobility (R26.89);Other symptoms and signs involving the nervous system (R29.898);Hemiplegia and hemiparesis Hemiplegia - Right/Left: Left Hemiplegia - caused by: Cerebral infarction     Time: 5625-6389 PT  Time Calculation (min) (ACUTE ONLY): 25 min  Charges:  $Gait Training: 8-22 mins $Therapeutic Activity: 8-22 mins                     Guerin Lashomb P, PT Acute Rehabilitation Services Pager: [phone] Office: [phone]    Lanisa Ishler B Adelia Baptista 04/02/2022, 10:22 AM

## 2022-04-02 NOTE — Progress Notes (Signed)
OT Cancellation Note  Patient Details Name: [name] MRN: [id number] DOB: 03/24/1966   Cancelled Treatment:    Reason Eval/Treat Not Completed: Patient at procedure or test/ unavailable (pt off unit, will reattempt as schedule allows.)  Alfonzo Beers, OTD, OTR/L Acute Rehab [phone]) [phone]  Mayer Masker 04/02/2022, 10:41 AM

## 2022-04-02 NOTE — Progress Notes (Addendum)
STROKE TEAM PROGRESS NOTE   INTERVAL HISTORY No family is at the bedside. Patient has been hemodynamically stable and her neurological exam is stable.  She states that her right arm feels better. TEE negative for thrombus or PFO.  Vitals:   04/02/22 1145 04/02/22 1156 04/02/22 1208 04/02/22 1224  BP: (!) 124/58 (!) 104/53 119/64   Pulse: 61 61 60   Resp: 17 19 18    Temp: 97.8 F (36.6 C)     TempSrc:      SpO2: 92% 100% 97% 97%  Weight:      Height:       CBC:  Recent Labs  Lab 03/29/22 0210 03/29/22 0820 04/01/22 0347 04/02/22 0327  WBC 6.2   < > 7.6 7.7  NEUTROABS 3.4  --   --   --   HGB 12.8   < > 11.9* 12.1  HCT 37.7   < > 34.2* 36.0  MCV 94.5   < > 94.0 95.2  PLT 212   < > 185 204   < > = values in this interval not displayed.    Basic Metabolic Panel:  Recent Labs  Lab 03/30/22 1750 03/31/22 0301 04/01/22 0347 04/02/22 0327  NA  --    < > 136 138  K  --    < > 4.0 3.7  CL  --    < > 104 107  CO2  --    < > 25 25  GLUCOSE  --    < > 263* 154*  BUN  --    < > 20 23*  CREATININE  --    < > 0.90 1.02*  CALCIUM  --    < > 8.9 9.3  MG 2.0  --   --   --   PHOS 3.6  --   --   --    < > = values in this interval not displayed.    Lipid Panel:  Recent Labs  Lab 03/31/22 0301  CHOL 254*  TRIG 194*  HDL 42  CHOLHDL 6.0  VLDL 39  LDLCALC 173*    HgbA1c:  Recent Labs  Lab 03/31/22 0301  HGBA1C 12.8*    Urine Drug Screen: No results for input(s): LABOPIA, COCAINSCRNUR, LABBENZ, AMPHETMU, THCU, LABBARB in the last 168 hours.  Alcohol Level No results for input(s): ETH in the last 168 hours.  IMAGING past 24 hours ECHO TEE  Result Date: 04/02/2022    TRANSESOPHOGEAL ECHO REPORT   Patient Name:   [name] Date of Exam: 04/02/2022 Medical Rec #:  [id number]      Height:       65.0 in Accession #:    [id number]     Weight:       200.0 lb Date of Birth:  30-Sep-1966     BSA:          1.978 m Patient Age:    56 years       BP:           124/58 mmHg  Patient Gender: F              HR:           76 bpm. Exam Location:  Inpatient Procedure: 3D Echo Indications:     Cerebral Infarction, unspecified I63.9  History:         Patient has prior history of Echocardiogram examinations, most  recent 03/25/2022. CAD and NSTEMI; Risk Factors:Hypertension,                  Diabetes and Dyslipidemia. Sarcoidosis.  Sonographer:     Darlina Sicilian RDCS Referring Phys:  [id number] RHONDA G BARRETT Diagnosing Phys: Eleonore Chiquito MD PROCEDURE: After discussion of the risks and benefits of a TEE, an informed consent was obtained from the patient. TEE procedure time was 7 minutes. The transesophogeal probe was passed without difficulty through the esophogus of the patient. Imaged were  obtained with the patient in a left lateral decubitus position. Sedation performed by different physician. The patient was monitored while under deep sedation. Anesthestetic sedation was provided intravenously by Anesthesiology: 300mg  of Propofol, 100mg   of Lidocaine. Image quality was excellent. The patient's vital signs; including heart rate, blood pressure, and oxygen saturation; remained stable throughout the procedure. The patient developed no complications during the procedure. IMPRESSIONS  1. Left ventricular ejection fraction, by estimation, is 60 to 65%. The left ventricle has normal function.  2. Right ventricular systolic function is normal. The right ventricular size is normal.  3. No left atrial/left atrial appendage thrombus was detected. The LAA emptying velocity was 76 cm/s.  4. The mitral valve is grossly normal. Trivial mitral valve regurgitation. No evidence of mitral stenosis.  5. The aortic valve is tricuspid. Aortic valve regurgitation is mild. No aortic stenosis is present.  6. Agitated saline contrast bubble study was negative, with no evidence of any interatrial shunt. Conclusion(s)/Recommendation(s): No LA/LAA thrombus identified. Negative bubble study for interatrial  shunt. No intracardiac source of embolism detected on this on this transesophageal echocardiogram. FINDINGS  Left Ventricle: Left ventricular ejection fraction, by estimation, is 60 to 65%. The left ventricle has normal function. The left ventricular internal cavity size was normal in size. Right Ventricle: The right ventricular size is normal. No increase in right ventricular wall thickness. Right ventricular systolic function is normal. Left Atrium: Left atrial size was normal in size. No left atrial/left atrial appendage thrombus was detected. The LAA emptying velocity was 76 cm/s. Right Atrium: Right atrial size was normal in size. Pericardium: Trivial pericardial effusion is present. Mitral Valve: The mitral valve is grossly normal. Trivial mitral valve regurgitation. No evidence of mitral valve stenosis. Tricuspid Valve: The tricuspid valve is grossly normal. Tricuspid valve regurgitation is mild . No evidence of tricuspid stenosis. Aortic Valve: The aortic valve is tricuspid. Aortic valve regurgitation is mild. No aortic stenosis is present. Pulmonic Valve: The pulmonic valve was grossly normal. Pulmonic valve regurgitation is trivial. No evidence of pulmonic stenosis. Aorta: The aortic root and ascending aorta are structurally normal, with no evidence of dilitation. There is minimal (Grade I) layered plaque involving the descending aorta. Venous: The left upper pulmonary vein, left lower pulmonary vein, right lower pulmonary vein and right upper pulmonary vein are normal. IAS/Shunts: No atrial level shunt detected by color flow Doppler. Agitated saline contrast was given intravenously to evaluate for intracardiac shunting. Agitated saline contrast bubble study was negative, with no evidence of any interatrial shunt.   AORTA Ao Root diam: 3.60 cm Ao Asc diam:  3.56 cm TRICUSPID VALVE TR Peak grad:   16.6 mmHg TR Vmax:        204.00 cm/s Eleonore Chiquito MD Electronically signed by Eleonore Chiquito MD Signature  Date/Time: 04/02/2022/11:58:59 AM    Final     PHYSICAL EXAM General:  Alert, well-nourished, well-developed patient in no acute distress Respiratory:  Regular, unlabored respirations on room air  NEURO:  Mental Status: AA&Ox3  Speech/Language: speech is without dysarthria or aphasia.  Repetition, fluency, and comprehension intact. Extremities: R forearm swelling with bruise, tender to touch  Cranial Nerves:  II: PERRL. Visual fields full.  III, IV, VI: EOMI. Eyelids elevate symmetrically.  V: Sensation is diminished on left side VII: Smile is symmetrical.   VIII: hearing intact to voice. IX, X: Phonation is normal.  XII: tongue is midline without fasciculations. Motor: 5/5 strength to RUE and RLE, 4/5 to LUE and 3/5 to LLE Tone: is normal and bulk is normal Sensation- Diminished on left side Coordination: FTN with ataxia on the left. Gait- deferred   ASSESSMENT/PLAN [name] is a 56 y.o. female with history of DM, CAD, NSTEMI and recent PCI presenting with left sided weakness and numbness.  Patient received TNK to treat stroke.  MRI revealed small, scattered infarcts in the right cerebral cortex and thalamus, likely embolic.  She did developed swelling and pain to her right wrist and arm near her radial artery access site.  Ultrasound of this area was negative.  TEE reveals no PFO or thrombus.  Stroke: Bilateral scattered infarct s/p TNK likely secondary to recent PCI and embolic source, ? Occult afib Code Stroke CT head No acute abnormality. ASPECTS 10.    CTA head & neck no LVO or hemodynamically significant stenosis MRI  scattered small infarcts in bilateral cerebral cortex and right thalamus 2D Echo EF 60-65%, no atrial level shunt, cystic liver disease noted TEE negative, no LV thrombus, no PFO Cardiology recommend 30 day cardiac event monitoring as outpt to rule out afib LDL 173 HgbA1c 12.8 VTE prophylaxis - SCDs clopidogrel 75 mg daily prior to admission, now  on clopidogrel 75 mg daily no ASA due to allergy.  Therapy recommendations:  CIR Disposition:  pending  CAD NSTEMI/recent PCI Patient underwent PCI on 5/27 Cardiology on board Continue Plavix  Hypertension Home meds:  carvedilol 25 mg BID, lisinopril-HCTZ 20-25 mg daily Stable On Coreg 25 twice daily Long-term BP goal normotensive  Hyperlipidemia Home meds:  none LDL 173, goal < 70 Add rosuvastatin 40 mg daily  Continue statin at discharge  Diabetes type II Uncontrolled Home meds:  none HgbA1c 12.8, goal < 7.0 CBGs Diabetes coordinator consult Insulin glargine 14 units BID Insulin aspart 4 units TID with meals SSI Close PCP follow-up for better DM control  Other Stroke Risk Factors Obesity, Body mass index is 33.27 kg/m., BMI >/= 30 associated with increased stroke risk, recommend weight loss, diet and exercise as appropriate   Other Active Problems Enlarged heterogenous thyroid - Thyroid US as outpatient Right UE swelling post procedure - CT RUE, RUE artrial and venous doppler negative - likely soft tissue swelling due to bruises  Hospital day # [place] , MSN, AGACNP-BC [facility] See Amion for schedule and pager information 04/02/2022 12:44 PM   ATTENDING NOTE: I reviewed above note and agree with the assessment and plan. Pt was seen and examined.   PT at the bedside working with pt. She sitting at edge of the bed, LUE and LLE weakness improved. Now LLE 3/5 and left hand grip also improved. TEE today showed in LV thrombus or PFO. Cardiology EP recommend 30 day instead of loop at this time. Pt not OCEANIC trial candidate per national PI. Will continue plavix and statin. Risk factor modification, especially better DM control. PT/OT recommend CIR.   For detailed assessment and plan, please refer  to above as I have made changes wherever appropriate.   Neurology will sign off. Please call with questions. Pt will follow up with stroke  clinic NP at [facility] in about 4 weeks. Thanks for the consult.   Rosalin Hawking, MD PhD Stroke Neurology 04/02/2022 2:38 PM    To contact Stroke Continuity provider, please refer to [email or web address] After hours, contact General Neurology

## 2022-04-02 NOTE — Interval H&P Note (Signed)
History and Physical Interval Note:  04/02/2022 10:41 AM  [name]  has presented today for surgery, with the diagnosis of stroke.  The various methods of treatment have been discussed with the patient and family. After consideration of risks, benefits and other options for treatment, the patient has consented to  Procedure(s): TRANSESOPHAGEAL ECHOCARDIOGRAM (TEE) (N/A) as a surgical intervention.  The patient's history has been reviewed, patient examined, no change in status, stable for surgery.  I have reviewed the patient's chart and labs.  Questions were answered to the patient's satisfaction.    TEE for CVA work-up. NPO.  Gerri Spore T. Flora Lipps, MD, [facility]  [facility]  [address] [place], [place] [address] [phone]  10:41 AM

## 2022-04-02 NOTE — Anesthesia Procedure Notes (Signed)
Procedure Name: General with mask airway Date/Time: 04/02/2022 11:37 AM Performed by: Katina Degree, CRNA Pre-anesthesia Checklist: Patient identified, Emergency Drugs available, Suction available and Patient being monitored Patient Re-evaluated:Patient Re-evaluated prior to induction Oxygen Delivery Method: Nasal cannula Preoxygenation: Pre-oxygenation with 100% oxygen Induction Type: IV induction Airway Equipment and Method: Bite block Dental Injury: Teeth and Oropharynx as per pre-operative assessment

## 2022-04-03 ENCOUNTER — Inpatient Hospital Stay: Payer: Medicaid Other | Admitting: Family Medicine

## 2022-04-03 DIAGNOSIS — Z955 Presence of coronary angioplasty implant and graft: Secondary | ICD-10-CM

## 2022-04-03 LAB — BASIC METABOLIC PANEL
Anion gap: 6 (ref 5–15)
BUN: 21 mg/dL — ABNORMAL HIGH (ref 6–20)
CO2: 23 mmol/L (ref 22–32)
Calcium: 8.5 mg/dL — ABNORMAL LOW (ref 8.9–10.3)
Chloride: 111 mmol/L (ref 98–111)
Creatinine, Ser: 0.78 mg/dL (ref 0.44–1.00)
GFR, Estimated: >60 mL/min (ref >60)
Glucose, Bld: 132 mg/dL — ABNORMAL HIGH (ref 70–99)
Potassium: 3.5 mmol/L (ref 3.5–5.1)
Sodium: 140 mmol/L (ref 135–145)

## 2022-04-03 LAB — CBC
HCT: 34 % — ABNORMAL LOW (ref 36.0–46.0)
Hemoglobin: 11.4 g/dL — ABNORMAL LOW (ref 12.0–15.0)
MCH: 32.2 pg (ref 26.0–34.0)
MCHC: 33.5 g/dL (ref 30.0–36.0)
MCV: 96 fL (ref 80.0–100.0)
Platelets: 211 10*3/uL (ref 150–400)
RBC: 3.54 MIL/uL — ABNORMAL LOW (ref 3.87–5.11)
RDW: 12.3 % (ref 11.5–15.5)
WBC: 6.5 10*3/uL (ref 4.0–10.5)
nRBC: 0 % (ref 0.0–0.2)

## 2022-04-03 LAB — GLUCOSE, CAPILLARY
Glucose-Capillary: 207 mg/dL — ABNORMAL HIGH (ref 70–99)
Glucose-Capillary: 162 mg/dL — ABNORMAL HIGH (ref 70–99)
Glucose-Capillary: 128 mg/dL — ABNORMAL HIGH (ref 70–99)
Glucose-Capillary: 224 mg/dL — ABNORMAL HIGH (ref 70–99)

## 2022-04-03 MED ORDER — ISOSORBIDE MONONITRATE ER 30 MG PO TB24
30.0000 mg | ORAL_TABLET | Freq: Every day | ORAL | Status: DC
Start: 2022-04-03 — End: 2022-04-08
  Administered 2022-04-03 – 2022-04-07 (×5): 30 mg via ORAL
  Filled 2022-04-03 (×5): qty 1

## 2022-04-03 NOTE — Progress Notes (Signed)
Patient sleeping comfortably. RN woke patient to assess pain. Pain denies chest pain at this time. Cardiology paged again to make aware.

## 2022-04-03 NOTE — Progress Notes (Signed)
IP rehab admissions - met with patient briefly this am.  She could not remember her [employer] insurance.  Researched [employer] and patient has Insurance claims handler.  Unfortunately, we do not contract with [employer].  Patient will likely need to go to another rehab center that is in network and contracts with her carrier.  I will forward information to admitting.  Call for questions.  [phone]

## 2022-04-03 NOTE — Progress Notes (Signed)
Pain Progress Note  Patient: [name] DOB: 1966-06-05  DOA: 03/29/2022  DOS: 04/03/2022   56 year old female with past medical history of coronary artery disease, diabetes mellitus type 2, noncompliance with medication, GERD presented to hospital with hematemesis chest pain and GERD symptoms.  Admitted for evaluation of chest pain, ruled in for NSTEMI, underwent cardiac catheterization with stent placement and was cleared by cardiology for discharge.  Hospitalization was notable for hyperglycemia with hemoglobin A1c 12.3, started on insulin. Discharge was contemplated 5/28, patient developed sudden onset left-sided numbness and weakness, code stroke was called.  Patient was then transferred to the neuro ICU and TNK was administered.  Patient was subsequently transferred out of the ICU to the medical team.  Assessment and Plan: Principal Problem:   NSTEMI (non-ST elevated myocardial infarction) [facility]) Active Problems:   Left sided numbness   CAD S/P percutaneous coronary angioplasty   Uncontrolled type 2 diabetes mellitus with hyperglycemia, without long-term current use of insulin (HCC)   Hematemesis   Cystic disease of liver   Essential hypertension, benign   Hyperlipidemia   Obesity (BMI 30-39.9)   Chest pain   Stroke (cerebrum) ([place])   NSTEMI, CAD: Patient underwent left heart catheterization with stent placement for in-stent thrombosis with excellent results.  Patient cannot tolerate aspirin due to anaphylaxis so is on Plavix, Crestor lisinopril.  Recommendation is to repeat the lipids in 6 to 8 weeks.   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  Thought to be secondary to recent catheterization/ cryptogenic in nature.  No cardioembolic source noted on 2D echocardiogram.  TEE done on 04/02/2022 showed LV ejection fraction of 60 to 65% with no atrial thrombus.  Bubble study was negative as well.  Long term cardiac monitoring recommended  by neurology.  Plan for 30-day monitoring on discharge. Physical therapy has recommended CIR at this time.  Neurology has signed off at this time.  Recommendation is to continue Plavix and statin.   Uncontrolled type 2 diabetes mellitus with hyperglycemia, without long-term current use of insulin ([place]): Latest HbA1c 12.5%.  Continue basal and sliding scale insulin. Consider semaglutide on discharge given concomitant obesity.    Cystic disease of liver Will need to follow-up with GI as outpatient.  Significance unknown at this time.   Hematemesis Reported initially but no further episodes.  Hemoglobin has remained stable.  Continue PPI at this time.  Will need to be on-Plavix.  Latest hemoglobin of 11.4 from 12.1.   Essential HTN:  Continue Coreg.  Lisinopril has been changed to losartan as per cardiology.    Hyperlipidemia.  Latest LDL 173. Continue Crestor.  LDL goal less than 55.   Obesity: Body mass index is 33.28 kg/m. Could benefit from GLP agonist due to diabetes and obesity.   RUE edema: Negative venous and arterial U/S.  Ecchymosis noted but without induration.   Enlarged thyroid gland. Plan for thyroid U/S as outpatient.   Debility deconditioning.  At this time, patient has been considered for CIR.    Subjective:  Today, patient stated that she did have an episode of chest discomfort this morning.  EKG was negative.  Improved with nitroglycerin.  Ecchymosis on the extremity unchanged   Objective: Vitals:   04/03/22 0317 04/03/22 0323 04/03/22 0328 04/03/22 0744  BP: 113/61 120/68 122/76 124/70  Pulse:    73  Resp:    20  Temp:    98.6 F (37 C)  TempSrc:    Oral  SpO2:    100%  Weight:      Height:       General: Obese built, not in obvious distress HENT:   No scleral pallor or icterus noted. Oral mucosa is moist.  Chest:  Clear breath sounds.  Diminished breath sounds bilaterally. No crackles or wheezes.  CVS: S1 &S2 heard. No murmur.  Regular rate and  rhythm. Abdomen: Soft, nontender, nondistended.  Bowel sounds are heard.   Extremities: No cyanosis, clubbing or edema.  Weakness over the left lower extremity.  Right forearm ecchymosis Psych: Alert, awake and oriented, normal mood CNS:  No cranial nerve deficits.  Decreased strength in the left lower extremity. Skin: Warm and dry.  No rashes noted.   Data Personally reviewed:  CBC: Recent Labs  Lab 03/29/22 0210 03/29/22 0820 03/30/22 0052 03/31/22 0301 04/01/22 0347 04/02/22 0327 04/03/22 0329  WBC 6.2  --  9.0 7.3 7.6 7.7 6.5  NEUTROABS 3.4  --   --   --   --   --   --   HGB 12.8   < > 12.0 12.6 11.9* 12.1 11.4*  HCT 37.7   < > 34.6* 36.5 34.2* 36.0 34.0*  MCV 94.5  --  95.6 93.6 94.0 95.2 96.0  PLT 212  --  171 186 185 204 211   < > = values in this interval not displayed.   Basic Metabolic Panel: Recent Labs  Lab 03/30/22 0052 03/30/22 1750 03/31/22 0301 04/01/22 0347 04/02/22 0327 04/03/22 0329  NA 136  --  139 136 138 140  K 4.1  --  3.5 4.0 3.7 3.5  CL 106  --  107 104 107 111  CO2 25  --  26 25 25 23   GLUCOSE 379*  --  188* 263* 154* 132*  BUN 13  --  7 20 23* 21*  CREATININE 0.81  --  0.72 0.90 1.02* 0.78  CALCIUM 8.3*  --  8.8* 8.9 9.3 8.5*  MG  --  2.0  --   --   --   --   PHOS  --  3.6  --   --   --   --    GFR: Estimated Creatinine Clearance: 86.4 mL/min (by C-G formula based on SCr of 0.78 mg/dL). Liver Function Tests: No results for input(s): AST, ALT, ALKPHOS, BILITOT, PROT, ALBUMIN in the last 168 hours. No results for input(s): LIPASE, AMYLASE in the last 168 hours. No results for input(s): AMMONIA in the last 168 hours. Coagulation Profile: No results for input(s): INR, PROTIME in the last 168 hours. Cardiac Enzymes: No results for input(s): CKTOTAL, CKMB, CKMBINDEX, TROPONINI in the last 168 hours. BNP (last 3 results) No results for input(s): PROBNP in the last 8760 hours. HbA1C: No results for input(s): HGBA1C in the last 72  hours.  CBG: Recent Labs  Lab 04/02/22 0557 04/02/22 1229 04/02/22 1614 04/02/22 2056 04/03/22 0605  GLUCAP 168* 117* 209* 197* 207*   Lipid Profile: No results for input(s): CHOL, HDL, LDLCALC, TRIG, CHOLHDL, LDLDIRECT in the last 72 hours.  Thyroid Function Tests: No results for input(s): TSH, T4TOTAL, FREET4, T3FREE, THYROIDAB in the last 72 hours. Anemia Panel: No results for input(s): VITAMINB12, FOLATE, FERRITIN, TIBC, IRON, RETICCTPCT in the last 72 hours. Urine analysis:    Component Value Date/Time   COLORURINE YELLOW 11/15/2019 0426   APPEARANCEUR CLEAR 11/15/2019 0426   LABSPEC >=1.030 03/16/2021 1640   PHURINE 5.5 03/16/2021 1640   GLUCOSEU 100 (A) 03/16/2021  1640   HGBUR NEGATIVE 03/16/2021 1640   BILIRUBINUR NEGATIVE 03/16/2021 1640   KETONESUR NEGATIVE 03/16/2021 1640   PROTEINUR NEGATIVE 03/16/2021 1640   UROBILINOGEN 0.2 03/16/2021 1640   NITRITE NEGATIVE 03/16/2021 1640   LEUKOCYTESUR SMALL (A) 03/16/2021 1640   Recent Results (from the past 240 hour(s))  MRSA Next Gen by PCR, Nasal     Status: Abnormal   Collection Time: 03/29/22  4:59 PM   Specimen: Nasal Mucosa; Nasal Swab  Result Value Ref Range Status   MRSA by PCR Next Gen DETECTED (A) NOT DETECTED Final    Comment: RESULT CALLED TO, READ BACK BY AND VERIFIED WITH: S,KING RN @1920  03/29/22 E,[place] (NOTE) The GeneXpert MRSA Assay (FDA approved for NASAL specimens only), is one component of a comprehensive MRSA colonization surveillance program. It is not intended to diagnose MRSA infection nor to guide or monitor treatment for MRSA infections. Test performance is not FDA approved in patients less than 57 years old. Performed at [facility], [place] [address] [place], [place] [address]      ECHO TEE  Result Date: 04/02/2022    TRANSESOPHOGEAL ECHO REPORT   Patient Name:   [name] Date of Exam: 04/02/2022 Medical Rec #:  [id number]      Height:       65.0 in Accession #:     [id number]     Weight:       200.0 lb Date of Birth:  [birth year]     BSA:          1.978 m Patient Age:    64 years       BP:           124/58 mmHg Patient Gender: F              HR:           76 bpm. Exam Location:  Inpatient Procedure: 3D Echo Indications:     Cerebral Infarction, unspecified I63.9  History:         Patient has prior history of Echocardiogram examinations, most                  recent 03/25/2022. CAD and NSTEMI; Risk Factors:Hypertension,                  Diabetes and Dyslipidemia. Sarcoidosis.  Sonographer:     Darlina Sicilian RDCS Referring Phys:  [id number] RHONDA G BARRETT Diagnosing Phys: Eleonore Chiquito MD PROCEDURE: After discussion of the risks and benefits of a TEE, an informed consent was obtained from the patient. TEE procedure time was 7 minutes. The transesophogeal probe was passed without difficulty through the esophogus of the patient. Imaged were  obtained with the patient in a left lateral decubitus position. Sedation performed by different physician. The patient was monitored while under deep sedation. Anesthestetic sedation was provided intravenously by Anesthesiology: 300mg  of Propofol, 100mg   of Lidocaine. Image quality was excellent. The patient's vital signs; including heart rate, blood pressure, and oxygen saturation; remained stable throughout the procedure. The patient developed no complications during the procedure. IMPRESSIONS  1. Left ventricular ejection fraction, by estimation, is 60 to 65%. The left ventricle has normal function.  2. Right ventricular systolic function is normal. The right ventricular size is normal.  3. No left atrial/left atrial appendage thrombus was detected. The LAA emptying velocity was 76 cm/s.  4. The mitral valve is grossly normal. Trivial mitral valve regurgitation. No evidence of mitral stenosis.  5. The aortic valve is tricuspid. Aortic valve regurgitation is mild. No aortic stenosis is present.  6. Agitated saline contrast bubble study was  negative, with no evidence of any interatrial shunt. Conclusion(s)/Recommendation(s): No LA/LAA thrombus identified. Negative bubble study for interatrial shunt. No intracardiac source of embolism detected on this on this transesophageal echocardiogram. FINDINGS  Left Ventricle: Left ventricular ejection fraction, by estimation, is 60 to 65%. The left ventricle has normal function. The left ventricular internal cavity size was normal in size. Right Ventricle: The right ventricular size is normal. No increase in right ventricular wall thickness. Right ventricular systolic function is normal. Left Atrium: Left atrial size was normal in size. No left atrial/left atrial appendage thrombus was detected. The LAA emptying velocity was 76 cm/s. Right Atrium: Right atrial size was normal in size. Pericardium: Trivial pericardial effusion is present. Mitral Valve: The mitral valve is grossly normal. Trivial mitral valve regurgitation. No evidence of mitral valve stenosis. Tricuspid Valve: The tricuspid valve is grossly normal. Tricuspid valve regurgitation is mild . No evidence of tricuspid stenosis. Aortic Valve: The aortic valve is tricuspid. Aortic valve regurgitation is mild. No aortic stenosis is present. Pulmonic Valve: The pulmonic valve was grossly normal. Pulmonic valve regurgitation is trivial. No evidence of pulmonic stenosis. Aorta: The aortic root and ascending aorta are structurally normal, with no evidence of dilitation. There is minimal (Grade I) layered plaque involving the descending aorta. Venous: The left upper pulmonary vein, left lower pulmonary vein, right lower pulmonary vein and right upper pulmonary vein are normal. IAS/Shunts: No atrial level shunt detected by color flow Doppler. Agitated saline contrast was given intravenously to evaluate for intracardiac shunting. Agitated saline contrast bubble study was negative, with no evidence of any interatrial shunt.   AORTA Ao Root diam: 3.60 cm Ao Asc  diam:  3.56 cm TRICUSPID VALVE TR Peak grad:   16.6 mmHg TR Vmax:        204.00 cm/s Eleonore Chiquito MD Electronically signed by Eleonore Chiquito MD Signature Date/Time: 04/02/2022/11:58:59 AM    Final     Family Communication: Spoke with the patient at bedside.  Disposition: Status is: Inpatient  Disposition.   Awaiting CIR disposition , will need 30-day event monitoring on discharge.  Planned Discharge Destination: Rehab  Flora Lipps, MD 04/03/2022 10:41 AM Page by [email or web address]

## 2022-04-03 NOTE — Progress Notes (Signed)
Progress Note  Patient Name: MEGYN GRIMES Date of Encounter: 04/03/2022  [facility] Cardiologist: Donato Heinz, MD   Subjective   Had an episode of chest pain/burning this morning >> EKG negative.  Relieved with NTG x 3.  Has not recurred.  No other complaints.  LLE unchanged.   Inpatient Medications    Scheduled Meds:  carvedilol  25 mg Oral BID   Chlorhexidine Gluconate Cloth  6 each Topical Daily   clopidogrel  75 mg Oral Daily   empagliflozin  10 mg Oral Daily   insulin aspart  0-20 Units Subcutaneous TID WC   insulin aspart  0-5 Units Subcutaneous QHS   insulin aspart  6 Units Subcutaneous TID WC   insulin glargine-yfgn  14 Units Subcutaneous BID   losartan  12.5 mg Oral QHS   mupirocin ointment  1 application. Nasal BID   pantoprazole  40 mg Oral QHS   rosuvastatin  40 mg Oral Daily   senna-docusate  1 tablet Oral BID   sodium chloride flush  3 mL Intravenous Q12H   sodium chloride flush  3 mL Intravenous Q12H   Continuous Infusions:  sodium chloride Stopped (03/30/22 1820)   sodium chloride 100 mL/hr at 04/03/22 0610   sodium chloride     PRN Meds: sodium chloride, acetaminophen **OR** acetaminophen (TYLENOL) oral liquid 160 mg/5 mL **OR** acetaminophen, albuterol, diphenhydrAMINE **OR** diphenhydrAMINE, labetalol, nitroGLYCERIN, ondansetron (ZOFRAN) IV, sodium chloride flush   Vital Signs    Vitals:   04/03/22 0317 04/03/22 0323 04/03/22 0328 04/03/22 0744  BP: 113/61 120/68 122/76 124/70  Pulse:    73  Resp:    20  Temp:    98.6 F (37 C)  TempSrc:    Oral  SpO2:    100%  Weight:      Height:        Intake/Output Summary (Last 24 hours) at 04/03/2022 0927 Last data filed at 04/03/2022 0747 Gross per 24 hour  Intake 1415.03 ml  Output 1250 ml  Net 165.03 ml      04/02/2022   10:14 AM 03/29/2022    6:00 AM 01/28/2021    9:25 AM  Last 3 Weights  Weight (lbs) 199 lb 15.3 oz 200 lb 204 lb 3.2 oz  Weight (kg) 90.7 kg 90.719 kg 92.625  kg      Telemetry    NSR - Personally Reviewed  ECG    Today NSR with acute ischemic changes  Physical Exam   GEN: No acute distress.   Neck: No JVD Cardiac: RRR, no murmurs, rubs, or gallops.  Respiratory: Clear to auscultation bilaterally. GI: Soft, nontender, non-distended  MS: No edema; No deformity. R forearm moderate hematoma Neuro:  Poor LLE motor function Psych: Normal affect   Labs    High Sensitivity Troponin:   Recent Labs  Lab 03/29/22 0820 03/29/22 1002 03/29/22 1634 03/30/22 0350 03/30/22 0540  TROPONINIHS 87* 134* 302* 184* 162*     Chemistry Recent Labs  Lab 03/30/22 1750 03/31/22 0301 04/01/22 0347 04/02/22 0327 04/03/22 0329  NA  --    < > 136 138 140  K  --    < > 4.0 3.7 3.5  CL  --    < > 104 107 111  CO2  --    < > 25 25 23   GLUCOSE  --    < > 263* 154* 132*  BUN  --    < > 20 23* 21*  CREATININE  --    < >  0.90 1.02* 0.78  CALCIUM  --    < > 8.9 9.3 8.5*  MG 2.0  --   --   --   --   GFRNONAA  --    < > >60 >60 >60  ANIONGAP  --    < > 7 6 6    < > = values in this interval not displayed.    Lipids  Recent Labs  Lab 03/31/22 0301  CHOL 254*  TRIG 194*  HDL 42  LDLCALC 173*  CHOLHDL 6.0    Hematology Recent Labs  Lab 04/01/22 0347 04/02/22 0327 04/03/22 0329  WBC 7.6 7.7 6.5  RBC 3.64* 3.78* 3.54*  HGB 11.9* 12.1 11.4*  HCT 34.2* 36.0 34.0*  MCV 94.0 95.2 96.0  MCH 32.7 32.0 32.2  MCHC 34.8 33.6 33.5  RDW 12.0 12.2 12.3  PLT 185 204 211   Thyroid No results for input(s): TSH, FREET4 in the last 168 hours.  BNPNo results for input(s): BNP, PROBNP in the last 168 hours.  DDimer  Recent Labs  Lab 03/29/22 0820  DDIMER 0.29     Radiology      Cardiac Studies   LHC 03/29/22:   RPDA-2 lesion is 99% stenosed.   RPDA-1 lesion is 40% stenosed.   2nd Diag lesion is 80% stenosed.   Previously placed Mid Cx stent (unknown type) is  widely patent.   A stent was successfully placed.   Post intervention, there  is a 0% residual stenosis.   LV end diastolic pressure is normal.   1.  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 2.  High-grade second diagonal lesion which should be treated medically due to proximity to LAD. 3.  LVEDP of 11 mmHg   The results were reviewed with Dr. Johney Frame.   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   TEE 5/31: Normal LV/RV function; no PFO, no LAAT or significant valvular issues  Patient Profile     56 y.o. female with history of known CAD s/p PCI to Lcx and PDA in 10/2019 at [facility], HTN, HLD, DMII, and prior history of PE who presented to the ER with chest burning radiating to her back found to have elevated troponin for which Cardiology was consulted.  Assessment & Plan    #Acute CVA Sudden left-sided weakness on 5/28, given tPA and admitted to neuro ICU.  Head CT unremarkable, brain MRI with small scattered acute infarcts.    Continue plavix monotherapy due to ASA allergy; TEE negative.  Etiology likely as a complication of PCI procedure.  Further evaluation per neurology with plans for monitor.  Has had no AF in house.   #NSTEMI: #CAD: S/p PCI of PDA due to ISR.  Cont plavix, statin, and coreg.  HR controlled.  Chest pain this AM.  Will start Imdur 30mg  QPM.  #HTN: BP under good control, cont current regimen  #HLD: Crestor for now, goal LDL < 55  #DMII: Very poorly controlled. A1C 12.5. Cont plavix in lieu of ASA, crestor, jardiance, and losartan.     For questions or updates, please contact [place] Please consult [email or web address] for contact info under        Signed, Early Osmond, MD  04/03/2022, 9:27 AM   Patient ID: [name], female   DOB: [birth year], 56 y.o.   MRN: [id number]

## 2022-04-03 NOTE — Progress Notes (Signed)
Seen patient on her bed talking on the phone, complained of chest pain 4/10 describes as having tightness on her chest, non radiating. Vital signs were taken, gave 1 dose of PRN Nitrostat. EKG done. After 5 minutes patient still having chest pain 4/10, vital signs taken, another dose of PRN Nitrostat given. After 5 minutes or at 2124 patient states pain free vital signs were re-checked again. After managing patient chest pain, RN checked the EKG and realized that patients information was not input. Paged on call MD to notify events. Read back the EKG reading that was previously done (NSR, Left axis deviation, Incomplete right BBB, minimal voltage criteria for LVH, may be normal variant R in aVL). No repeat EKG order. Will continue to monitor patient.

## 2022-04-03 NOTE — Anesthesia Postprocedure Evaluation (Signed)
Anesthesia Post Note  Patient: [name]  Procedure(s) Performed: TRANSESOPHAGEAL ECHOCARDIOGRAM (TEE) BUBBLE STUDY     Patient location during evaluation: PACU Anesthesia Type: MAC Level of consciousness: awake and alert Pain management: pain level controlled Vital Signs Assessment: post-procedure vital signs reviewed and stable Respiratory status: spontaneous breathing, nonlabored ventilation, respiratory function stable and patient connected to nasal cannula oxygen Cardiovascular status: stable and blood pressure returned to baseline Postop Assessment: no apparent nausea or vomiting Anesthetic complications: no   No notable events documented.  Last Vitals:  Vitals:   04/03/22 0328 04/03/22 0744  BP: 122/76 124/70  Pulse:  73  Resp:  20  Temp:  37 C  SpO2:  100%    Last Pain:  Vitals:   04/03/22 0744  TempSrc: Oral  PainSc:                  Tiajuana Amass

## 2022-04-03 NOTE — Progress Notes (Signed)
Patient woken for routine vital signs. When woken, patient c/o 7/10 stabbing chest pain in the center of her chest. RN gave Nitro x3, per order with no relief. Contacted primary team who deferred to cardiology for further input. EKG obtained, NSR.   Patient reports no other symptoms at this time except for a headache after Nitro given. Cardiology paged and awaiting further orders. Patient otherwise resting comfortably.

## 2022-04-03 NOTE — Progress Notes (Signed)
Physical Therapy Treatment Patient Details Name: [name] MRN: [id number] DOB: [birth year] Today's Date: 04/03/2022   History of Present Illness 56 yo F admitted 5/27 as code STEMI, taken for LHC.  On 5/28 pt had L sided sensory loss. Code stroke activated.  Received tnk. After tnk had oral mucosal bleeding, HA, chest pain, R forearm pain and swelling at site of LHC access. MRI - Small scattered acute infarcts in the cerebral cortex and right thalamus. PMhx: polyneuropathy, obesity, HLD, CAD, DM, HTN    PT Comments    Pt making steady progress toward goals.  Emphasis on prep for standing, sit to stand technique/safety, pre-gait and progression of gait quality/stability,  pt still needing min for overall basic mobility and moderate facilitation for normalizing L LE swing through.    Recommendations for follow up therapy are one component of a multi-disciplinary discharge planning process, led by the attending physician.  Recommendations may be updated based on patient status, additional functional criteria and insurance authorization.  Follow Up Recommendations  Acute inpatient rehab (3hours/day)     Assistance Recommended at Discharge Frequent or constant Supervision/Assistance  Patient can return home with the following A lot of help with bathing/dressing/bathroom;Assistance with cooking/housework;Assist for transportation;Help with stairs or ramp for entrance;A lot of help with walking and/or transfers   Equipment Recommendations  Rolling walker (2 wheels)    Recommendations for Other Services Rehab consult     Precautions / Restrictions Precautions Precautions: Fall Precaution Comments: 5/27 radial heart cath     Mobility  Bed Mobility               General bed mobility comments: OOB on arrival    Transfers Overall transfer level: Needs assistance Equipment used: Rolling walker (2 wheels) Transfers: Sit to/from Stand Sit to Stand: Min assist            General transfer comment: sit to stand x4 working on getting ready to stand, coming forward, pushing from arm rests and boost upright    Ambulation/Gait Ambulation/Gait assistance: Min assist (mod assist to help normalize L LE) Gait Distance (Feet): 20 Feet (x2) Assistive device: Rolling walker (2 wheels) Gait Pattern/deviations: Step-to pattern, Step-through pattern   Gait velocity interpretation: <1.31 ft/sec, indicative of household ambulator   General Gait Details: pt with LLE extensor tone allowing weight bearing and pt with multimodal cues to weight shift and advance LLE,very short steps but pt able to move LLE for limited distance. Reliant on RW for support and limited by fatigue.  Today during second trial, pt able to complete several trials of weak to off/L knee flexion of assisted swing through, but no carry over when pt self-swings through.   Stairs             Wheelchair Mobility    Modified Rankin (Stroke Patients Only) Modified Rankin (Stroke Patients Only) Pre-Morbid Rankin Score: No symptoms Modified Rankin: Moderately severe disability     Balance Overall balance assessment: Needs assistance   Sitting balance-Leahy Scale: Fair     Standing balance support: Bilateral upper extremity supported, Single extremity supported Standing balance-Leahy Scale: Poor Standing balance comment: reliant on external support.  Several trials of pre-gait stressing w/shift and unweighting.                            Cognition Arousal/Alertness: Awake/alert Behavior During Therapy: WFL for tasks assessed/performed Overall Cognitive Status:  (NT formally)  Exercises      General Comments        Pertinent Vitals/Pain Pain Assessment Pain Location: r arm Pain Intervention(s): Monitored during session    Home Living                          Prior Function            PT Goals  (current goals can now be found in the care plan section) Acute Rehab PT Goals PT Goal Formulation: With patient Time For Goal Achievement: 04/14/22 Potential to Achieve Goals: Good Progress towards PT goals: Progressing toward goals    Frequency    Min 4X/week      PT Plan Current plan remains appropriate    Co-evaluation              AM-PAC PT "6 Clicks" Mobility   Outcome Measure  Help needed turning from your back to your side while in a flat bed without using bedrails?: A Fojtik Help needed moving from lying on your back to sitting on the side of a flat bed without using bedrails?: A Lot Help needed moving to and from a bed to a chair (including a wheelchair)?: A Lindh Help needed standing up from a chair using your arms (e.g., wheelchair or bedside chair)?: A Lokey Help needed to walk in hospital room?: A Lot Help needed climbing 3-5 steps with a railing? : Total 6 Click Score: 14    End of Session Equipment Utilized During Treatment: Gait belt Activity Tolerance: Patient tolerated treatment well Patient left: in chair;with call bell/phone within reach;with chair alarm set;with family/visitor present Nurse Communication: Mobility status PT Visit Diagnosis: Other abnormalities of gait and mobility (R26.89);Other symptoms and signs involving the nervous system (R29.898);Hemiplegia and hemiparesis Hemiplegia - Right/Left: Left Hemiplegia - dominant/non-dominant: Dominant Hemiplegia - caused by: Cerebral infarction     Time: 1411-1438 PT Time Calculation (min) (ACUTE ONLY): 27 min  Charges:  $Gait Training: 8-22 mins $Neuromuscular Re-education: 8-22 mins                     04/03/2022  Jacinto Halim., PT Acute Rehabilitation Services [phone]  (pager) [phone]  (office)   Eliseo Gum Juvencio Verdi 04/03/2022, 3:53 PM

## 2022-04-04 DIAGNOSIS — R079 Chest pain, unspecified: Secondary | ICD-10-CM

## 2022-04-04 LAB — BASIC METABOLIC PANEL
Anion gap: 6 (ref 5–15)
BUN: 15 mg/dL (ref 6–20)
CO2: 23 mmol/L (ref 22–32)
Calcium: 8.5 mg/dL — ABNORMAL LOW (ref 8.9–10.3)
Chloride: 113 mmol/L — ABNORMAL HIGH (ref 98–111)
Creatinine, Ser: 0.79 mg/dL (ref 0.44–1.00)
GFR, Estimated: >60 mL/min (ref >60)
Glucose, Bld: 95 mg/dL (ref 70–99)
Potassium: 3.6 mmol/L (ref 3.5–5.1)
Sodium: 142 mmol/L (ref 135–145)

## 2022-04-04 LAB — GLUCOSE, CAPILLARY
Glucose-Capillary: 176 mg/dL — ABNORMAL HIGH (ref 70–99)
Glucose-Capillary: 140 mg/dL — ABNORMAL HIGH (ref 70–99)
Glucose-Capillary: 164 mg/dL — ABNORMAL HIGH (ref 70–99)
Glucose-Capillary: 212 mg/dL — ABNORMAL HIGH (ref 70–99)
Glucose-Capillary: 176 mg/dL — ABNORMAL HIGH (ref 70–99)

## 2022-04-04 LAB — CBC
HCT: 30.9 % — ABNORMAL LOW (ref 36.0–46.0)
Hemoglobin: 10.6 g/dL — ABNORMAL LOW (ref 12.0–15.0)
MCH: 32.4 pg (ref 26.0–34.0)
MCHC: 34.3 g/dL (ref 30.0–36.0)
MCV: 94.5 fL (ref 80.0–100.0)
Platelets: 208 10*3/uL (ref 150–400)
RBC: 3.27 MIL/uL — ABNORMAL LOW (ref 3.87–5.11)
RDW: 12.2 % (ref 11.5–15.5)
WBC: 7.3 10*3/uL (ref 4.0–10.5)
nRBC: 0 % (ref 0.0–0.2)

## 2022-04-04 NOTE — Evaluation (Signed)
Occupational Therapy Evaluation Patient Details Name: [name] MRN: [id number] DOB: 1966-10-14 Today's Date: 04/04/2022   History of Present Illness 56 yo F admitted 5/27 as code STEMI, taken for LHC.  On 5/28 pt had L sided sensory loss. Code stroke activated.  Received tnk. After tnk had oral mucosal bleeding, HA, chest pain, R forearm pain and swelling at site of LHC access. MRI - Small scattered acute infarcts in the cerebral cortex and right thalamus. PMhx: polyneuropathy, obesity, HLD, CAD, DM, HTN   Clinical Impression   Patient supine in bed and eager to get OOB.  Completing bed mobility with min assist, stand pivot with min assist to [facility] and then to recliner.  Requires cueing for hand placement and safety, assist with L LE mgmt but improves during 2nd transfer.  Patient requires max assist for hygiene after +BM, and max assist for LB dressing.   Highly recommend CIR level of rehab, pt reports plan to complete in [place]. Will follow acutely.      Recommendations for follow up therapy are one component of a multi-disciplinary discharge planning process, led by the attending physician.  Recommendations may be updated based on patient status, additional functional criteria and insurance authorization.   Follow Up Recommendations  Acute inpatient rehab (3hours/day)    Assistance Recommended at Discharge Frequent or constant Supervision/Assistance  Patient can return home with the following Assistance with cooking/housework;Help with stairs or ramp for entrance;Assist for transportation;Direct supervision/assist for financial management;Direct supervision/assist for medications management;A Cerino help with walking and/or transfers;A lot of help with bathing/dressing/bathroom    Functional Status Assessment     Equipment Recommendations  Other (comment) (defer)    Recommendations for Other Services Rehab consult     Precautions / Restrictions Precautions Precautions:  Fall Precaution Comments: 5/27 radial heart cath Restrictions Weight Bearing Restrictions: No      Mobility Bed Mobility Overal bed mobility: Needs Assistance Bed Mobility: Supine to Sit     Supine to sit: Min assist, HOB elevated     General bed mobility comments: L LE mgmt and safety    Transfers                          Balance Overall balance assessment: Needs assistance   Sitting balance-Leahy Scale: Fair     Standing balance support: Bilateral upper extremity supported, During functional activity Standing balance-Leahy Scale: Poor Standing balance comment: relies on BUE support                           ADL either performed or assessed with clinical judgement   ADL Overall ADL's : Needs assistance/impaired     Grooming: Minimal assistance               Lower Body Dressing: Maximal assistance;Sit to/from stand   Toilet Transfer: Minimal assistance;Stand-pivot;BSC/3in1;Rolling walker (2 wheels)   Toileting- Clothing Manipulation and Hygiene: Sit to/from stand;Maximal assistance Toileting - Clothing Manipulation Details (indicate cue type and reason): after + BM     Functional mobility during ADLs: Minimal assistance;Rolling walker (2 wheels) General ADL Comments: assist for L LE management     Vision         Perception     Praxis      Pertinent Vitals/Pain Pain Assessment Pain Assessment: No/denies pain     Hand Dominance     Extremity/Trunk Assessment  Communication     Cognition Arousal/Alertness: Awake/alert Behavior During Therapy: WFL for tasks assessed/performed Overall Cognitive Status: Impaired/Different from baseline Area of Impairment: Following commands, Safety/judgement, Awareness, Problem solving                       Following Commands: Follows one step commands consistently, Follows one step commands with increased time Safety/Judgement: Decreased awareness of safety,  Decreased awareness of deficits Awareness: Emergent Problem Solving: Requires verbal cues, Difficulty sequencing General Comments: pt continues to require cueing for safety and sequencing during transfer and ADLs     General Comments  VSS    Exercises     Shoulder Instructions      Home Living                                          Prior Functioning/Environment                          OT Problem List:        OT Treatment/Interventions:      OT Goals(Current goals can be found in the care plan section) Acute Rehab OT Goals Patient Stated Goal: get to rehab OT Goal Formulation: With patient Time For Goal Achievement: 04/14/22 Potential to Achieve Goals: Good  OT Frequency: Min 3X/week    Co-evaluation              AM-PAC OT "6 Clicks" Daily Activity     Outcome Measure Help from another person eating meals?: A Cott Help from another person taking care of personal grooming?: A Enns Help from another person toileting, which includes using toliet, bedpan, or urinal?: A Hett Help from another person bathing (including washing, rinsing, drying)?: A Lot Help from another person to put on and taking off regular upper body clothing?: A Canny Help from another person to put on and taking off regular lower body clothing?: A Lot 6 Click Score: 16   End of Session Equipment Utilized During Treatment: Gait belt;Rolling walker (2 wheels) Nurse Communication: Mobility status  Activity Tolerance: Patient tolerated treatment well Patient left: in chair;with call bell/phone within reach;with chair alarm set  OT Visit Diagnosis: Other abnormalities of gait and mobility (R26.89);Muscle weakness (generalized) (M62.81);Other symptoms and signs involving cognitive function;Hemiplegia and hemiparesis;Pain Hemiplegia - Right/Left: Left Hemiplegia - dominant/non-dominant: Non-Dominant Hemiplegia - caused by: Cerebral infarction                 [id number] OT Time Calculation (min): 28 min Charges:  OT General Charges $OT Visit: 1 Visit OT Treatments $Self Care/Home Management : 23-37 mins  Jolaine Artist, OT Acute Rehabilitation Services Office [phone]   [name] 04/04/2022, 1:57 PM

## 2022-04-04 NOTE — TOC Progression Note (Signed)
Transition of Care [facility]) - Progression Note    Patient Details  Name: [name] MRN: [id number] Date of Birth: 1966/03/08  Transition of Care [facility]) CM/SW [place], [place] Phone Number: 04/04/2022, 3:51 PM  Clinical Narrative:   CSW met with patient to discuss [facility], as they are in network. CSW answered patient's questions and family at bedside. Patient in agreement, says she wants to get better. CSW updated [facility] and they will initiate insurance authorization. CSW also provided patient's FMLA paperwork to MD to complete, and sent to patient's job per her request. CSW to follow.    Expected Discharge Plan: [place] Barriers to Discharge: Continued Medical Work up, Ship broker  Expected Discharge Plan and Services Expected Discharge Plan: [place] In-house Referral: Development worker, community Discharge Planning Services: CM Consult, [place] [facility]   Living arrangements for the past 2 months: Apartment                                       Social Determinants of Health (SDOH) Interventions    Readmission Risk Interventions     View : No data to display.

## 2022-04-04 NOTE — Progress Notes (Signed)
Progress Note  Patient Name: AMINATA COONS Date of Encounter: 04/04/2022  [facility] Cardiologist: Donato Heinz, MD   Subjective   No recurrent chest pain.  No atrial fibrillation on telemetry.  Largely unchanged today.   Inpatient Medications    Scheduled Meds:  carvedilol  25 mg Oral BID   Chlorhexidine Gluconate Cloth  6 each Topical Daily   clopidogrel  75 mg Oral Daily   empagliflozin  10 mg Oral Daily   insulin aspart  0-20 Units Subcutaneous TID WC   insulin aspart  0-5 Units Subcutaneous QHS   insulin aspart  6 Units Subcutaneous TID WC   insulin glargine-yfgn  14 Units Subcutaneous BID   isosorbide mononitrate  30 mg Oral QHS   losartan  12.5 mg Oral QHS   pantoprazole  40 mg Oral QHS   rosuvastatin  40 mg Oral Daily   senna-docusate  1 tablet Oral BID   sodium chloride flush  3 mL Intravenous Q12H   sodium chloride flush  3 mL Intravenous Q12H   Continuous Infusions:  sodium chloride Stopped (03/30/22 1820)   sodium chloride 100 mL/hr at 04/04/22 0254   sodium chloride     PRN Meds: sodium chloride, acetaminophen **OR** acetaminophen (TYLENOL) oral liquid 160 mg/5 mL **OR** acetaminophen, albuterol, diphenhydrAMINE **OR** diphenhydrAMINE, labetalol, nitroGLYCERIN, ondansetron (ZOFRAN) IV, sodium chloride flush   Vital Signs    Vitals:   04/03/22 2124 04/03/22 2327 04/04/22 0347 04/04/22 0716  BP: (!) 166/77 119/74 130/71 119/64  Pulse: 70 67 (!) 59 64  Resp:  20 20 16   Temp:  98.8 F (37.1 C) 98.8 F (37.1 C) 98.7 F (37.1 C)  TempSrc:  Oral Oral Oral  SpO2:  98% 95% 97%  Weight:      Height:        Intake/Output Summary (Last 24 hours) at 04/04/2022 1054 Last data filed at 04/04/2022 0900 Gross per 24 hour  Intake 1468.19 ml  Output 850 ml  Net 618.19 ml      04/02/2022   10:14 AM 03/29/2022    6:00 AM 01/28/2021    9:25 AM  Last 3 Weights  Weight (lbs) 199 lb 15.3 oz 200 lb 204 lb 3.2 oz  Weight (kg) 90.7 kg 90.719 kg 92.625  kg      Telemetry    NSR - Personally Reviewed  ECG    Today NSR with acute ischemic changes  Physical Exam   GEN: No acute distress.   Neck: No JVD Cardiac: RRR, no murmurs, rubs, or gallops.  Respiratory: Clear to auscultation bilaterally. GI: Soft, nontender, non-distended  MS: No edema; No deformity. R forearm bruising without hematoma Neuro:  Poor LLE motor function Psych: Normal affect   Labs    High Sensitivity Troponin:   Recent Labs  Lab 03/29/22 0820 03/29/22 1002 03/29/22 1634 03/30/22 0350 03/30/22 0540  TROPONINIHS 87* 134* 302* 184* 162*     Chemistry Recent Labs  Lab 03/30/22 1750 03/31/22 0301 04/02/22 0327 04/03/22 0329 04/04/22 0308  NA  --    < > 138 140 142  K  --    < > 3.7 3.5 3.6  CL  --    < > 107 111 113*  CO2  --    < > 25 23 23   GLUCOSE  --    < > 154* 132* 95  BUN  --    < > 23* 21* 15  CREATININE  --    < >  1.02* 0.78 0.79  CALCIUM  --    < > 9.3 8.5* 8.5*  MG 2.0  --   --   --   --   GFRNONAA  --    < > >60 >60 >60  ANIONGAP  --    < > 6 6 6    < > = values in this interval not displayed.    Lipids  Recent Labs  Lab 03/31/22 0301  CHOL 254*  TRIG 194*  HDL 42  LDLCALC 173*  CHOLHDL 6.0    Hematology Recent Labs  Lab 04/02/22 0327 04/03/22 0329 04/04/22 0308  WBC 7.7 6.5 7.3  RBC 3.78* 3.54* 3.27*  HGB 12.1 11.4* 10.6*  HCT 36.0 34.0* 30.9*  MCV 95.2 96.0 94.5  MCH 32.0 32.2 32.4  MCHC 33.6 33.5 34.3  RDW 12.2 12.3 12.2  PLT 204 211 208   Thyroid No results for input(s): TSH, FREET4 in the last 168 hours.  BNPNo results for input(s): BNP, PROBNP in the last 168 hours.  DDimer  Recent Labs  Lab 03/29/22 0820  DDIMER 0.29     Radiology      Cardiac Studies   LHC 03/29/22:   RPDA-2 lesion is 99% stenosed.   RPDA-1 lesion is 40% stenosed.   2nd Diag lesion is 80% stenosed.   Previously placed Mid Cx stent (unknown type) is  widely patent.   A stent was successfully placed.   Post intervention,  there is a 0% residual stenosis.   LV end diastolic pressure is normal.   1.  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 2.  High-grade second diagonal lesion which should be treated medically due to proximity to LAD. 3.  LVEDP of 11 mmHg   The results were reviewed with Dr. Johney Frame.   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   TEE 5/31: Normal LV/RV function; no PFO, no LAAT or significant valvular issues  Patient Profile     56 y.o. female with history of known CAD s/p PCI to Lcx and PDA in 10/2019 at [facility] hospital, HTN, HLD, DMII, and prior history of PE who presented to the ER with chest burning radiating to her back found to have elevated troponin for which Cardiology was consulted.  Assessment & Plan    #Acute CVA Sudden left-sided weakness on 5/28, given tPA and admitted to neuro ICU.  Head CT unremarkable, brain MRI with small scattered acute infarcts.    Continue plavix monotherapy due to ASA allergy; TEE negative.  Etiology likely as a complication of PCI procedure.  Further evaluation per neurology with plans for monitor.  Has had no AF in house.  Will sign off, will arrange outpatient Cardiology follow up with Dr. Gardiner Rhyme.   #NSTEMI: #CAD: S/p PCI of PDA due to ISR.  Cont plavix, statin, Imdur, and Coreg.  No recurrent chest pain  #HTN: BP under good control, cont current regimen  #HLD: Crestor for now, goal LDL < 55  #DMII: Very poorly controlled. A1C 12.5. Cont plavix in lieu of ASA, crestor, jardiance, and losartan.     For questions or updates, please contact [place] Please consult [email or web address] for contact info under        Signed, Early Osmond, MD  04/04/2022, 10:54 AM   Patient ID: [name], female   DOB: 09-20-1966, 56 y.o.   MRN: [id number]

## 2022-04-04 NOTE — Progress Notes (Signed)
Pain Progress Note  Patient: [name] WUJ:[id number] DOB: 10/25/1966  DOA: 03/29/2022  DOS: 04/04/2022   56 year old female with past medical history of coronary artery disease, diabetes mellitus type 2, noncompliance with medication, GERD presented to hospital with hematemesis chest pain and GERD symptoms.  Patient was admitted to the hospital for evaluation of chest pain, ruled in for NSTEMI, underwent cardiac catheterization with stent placement and was cleared by cardiology for discharge.  Hospitalization was notable for hyperglycemia with hemoglobin A1c 12.3, started on insulin. Discharge was contemplated 5/28, patient developed sudden onset left-sided numbness and weakness, code stroke was called.  Patient was then transferred to the neuro ICU and TNK was administered.  Patient was subsequently transferred out of the ICU to the medical team.  Assessment and Plan: Principal Problem:   NSTEMI (non-ST elevated myocardial infarction) [facility](HCC) Active Problems:   Left sided numbness   CAD S/P percutaneous coronary angioplasty   Uncontrolled type 2 diabetes mellitus with hyperglycemia, without long-term current use of insulin (HCC)   Hematemesis   Cystic disease of liver   Essential hypertension, benign   Hyperlipidemia   Obesity (BMI 30-39.9)   Chest pain   Stroke (cerebrum) (HCC)   NSTEMI, CAD: Patient underwent left heart catheterization with stent placement for in-stent thrombosis with excellent results.  Patient cannot tolerate aspirin due to anaphylaxis so is on Plavix, Crestor lisinopril.    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  Thought to be secondary to recent catheterization/ cryptogenic in nature.  No cardioembolic source noted on 2D echocardiogram.  TEE done on 04/02/2022 showed LV ejection fraction of 60 to 65% with no atrial thrombus.  Bubble study was negative as well.  Plan for 30-day monitoring on discharge. Physical therapy has  recommended CIR at this time.  Neurology has signed off at this time.  Recommendation is to continue Plavix and statin.   Uncontrolled type 2 diabetes mellitus with hyperglycemia, without long-term current use of insulin (HCC): Latest HbA1c 12.5%.  Continue basal and sliding scale insulin. Consider semaglutide as outpatient  given concomitant obesity.    Cystic disease of liver Will need to follow-up with GI as outpatient.  Significance unknown at this time.   Hematemesis Resolved at this time.  Continue PPI.   Essential HTN:  Continue Coreg.  Lisinopril has been changed to losartan as per cardiology.  Blood pressure seems to be stable at this time.  Hyperlipidemia.  Latest LDL 173. Continue Crestor.  LDL goal less than 55.   Obesity: Body mass index is 33.28 kg/m. Could benefit from GLP agonist due to diabetes and obesity.   RUE edema: Negative venous and arterial U/S.  Ecchymosis noted but without induration.   Enlarged thyroid gland. Plan for thyroid U/S as outpatient.   Debility deconditioning.  At this time, patient has been considered for CIR.    Subjective:  Today, had some some mild chest discomfort no chest pain at this time.    Objective: Vitals:   04/03/22 2327 04/04/22 0347 04/04/22 0716 04/04/22 1219  BP: 119/74 130/71 119/64 117/66  Pulse: 67 (!) 59 64 (!) 58  Resp: 20 20 16 16   Temp: 98.8 F (37.1 C) 98.8 F (37.1 C) 98.7 F (37.1 C) 98.6 F (37 C)  TempSrc: Oral Oral Oral Oral  SpO2: 98% 95% 97% 99%  Weight:      Height:       General: Obese built, not in obvious distress HENT:   No scleral  pallor or icterus noted. Oral mucosa is moist.  Chest:  Clear breath sounds.  Diminished breath sounds bilaterally. No crackles or wheezes.  CVS: S1 &S2 heard. No murmur.  Regular rate and rhythm. Abdomen: Soft, nontender, nondistended.  Bowel sounds are heard.   Extremities: No cyanosis, clubbing or edema.  Peripheral pulses are palpable.  Weakness over the left  lower extremity.  Right forearm ecchymosis. Psych: Alert, awake and oriented, normal mood CNS:  No cranial nerve deficits.  Decreased strength in the left lower extremity. Skin: Warm and dry.  No rashes noted.  Data Personally reviewed:  CBC: Recent Labs  Lab 03/29/22 0210 03/29/22 0820 03/31/22 0301 04/01/22 0347 04/02/22 0327 04/03/22 0329 04/04/22 0308  WBC 6.2   < > 7.3 7.6 7.7 6.5 7.3  NEUTROABS 3.4  --   --   --   --   --   --   HGB 12.8   < > 12.6 11.9* 12.1 11.4* 10.6*  HCT 37.7   < > 36.5 34.2* 36.0 34.0* 30.9*  MCV 94.5   < > 93.6 94.0 95.2 96.0 94.5  PLT 212   < > 186 185 204 211 208   < > = values in this interval not displayed.   Basic Metabolic Panel: Recent Labs  Lab 03/30/22 1750 03/31/22 0301 04/01/22 0347 04/02/22 0327 04/03/22 0329 04/04/22 0308  NA  --  139 136 138 140 142  K  --  3.5 4.0 3.7 3.5 3.6  CL  --  107 104 107 111 113*  CO2  --  26 25 25 23 23   GLUCOSE  --  188* 263* 154* 132* 95  BUN  --  7 20 23* 21* 15  CREATININE  --  0.72 0.90 1.02* 0.78 0.79  CALCIUM  --  8.8* 8.9 9.3 8.5* 8.5*  MG 2.0  --   --   --   --   --   PHOS 3.6  --   --   --   --   --    GFR: Estimated Creatinine Clearance: 86.4 mL/min (by C-G formula based on SCr of 0.79 mg/dL). Liver Function Tests: No results for input(s): AST, ALT, ALKPHOS, BILITOT, PROT, ALBUMIN in the last 168 hours. No results for input(s): LIPASE, AMYLASE in the last 168 hours. No results for input(s): AMMONIA in the last 168 hours. Coagulation Profile: No results for input(s): INR, PROTIME in the last 168 hours. Cardiac Enzymes: No results for input(s): CKTOTAL, CKMB, CKMBINDEX, TROPONINI in the last 168 hours. BNP (last 3 results) No results for input(s): PROBNP in the last 8760 hours. HbA1C: No results for input(s): HGBA1C in the last 72 hours.  CBG: Recent Labs  Lab 04/03/22 1558 04/03/22 2155 04/04/22 0605 04/04/22 0847 04/04/22 1217  GLUCAP 128* 224* 176* 140* 164*    Lipid Profile: No results for input(s): CHOL, HDL, LDLCALC, TRIG, CHOLHDL, LDLDIRECT in the last 72 hours.  Thyroid Function Tests: No results for input(s): TSH, T4TOTAL, FREET4, T3FREE, THYROIDAB in the last 72 hours. Anemia Panel: No results for input(s): VITAMINB12, FOLATE, FERRITIN, TIBC, IRON, RETICCTPCT in the last 72 hours. Urine analysis:    Component Value Date/Time   COLORURINE YELLOW 11/15/2019 0426   APPEARANCEUR CLEAR 11/15/2019 0426   LABSPEC >=1.030 03/16/2021 1640   PHURINE 5.5 03/16/2021 1640   GLUCOSEU 100 (A) 03/16/2021 1640   HGBUR NEGATIVE 03/16/2021 1640   BILIRUBINUR NEGATIVE 03/16/2021 1640   KETONESUR NEGATIVE 03/16/2021 1640   PROTEINUR NEGATIVE 03/16/2021  1640   UROBILINOGEN 0.2 03/16/2021 1640   NITRITE NEGATIVE 03/16/2021 1640   LEUKOCYTESUR SMALL (A) 03/16/2021 1640   Recent Results (from the past 240 hour(s))  MRSA Next Gen by PCR, Nasal     Status: Abnormal   Collection Time: 03/29/22  4:59 PM   Specimen: Nasal Mucosa; Nasal Swab  Result Value Ref Range Status   MRSA by PCR Next Gen DETECTED (A) NOT DETECTED Final    Comment: RESULT CALLED TO, READ BACK BY AND VERIFIED WITH: S,KING RN @1920  03/29/22 E,BENTON (NOTE) The GeneXpert MRSA Assay (FDA approved for NASAL specimens only), is one component of a comprehensive MRSA colonization surveillance program. It is not intended to diagnose MRSA infection nor to guide or monitor treatment for MRSA infections. Test performance is not FDA approved in patients less than 11 years old. Performed at [facility], [address], [place], [place]      No results found.  Family Communication: Spoke with the patient at bedside.  Disposition: Status is: Inpatient  Disposition.   Awaiting CIR disposition , will need 30-day event monitoring on discharge.  Planned Discharge Destination: Rehab  [id number], MD 04/04/2022 1:10 PM Page by [email or web address]

## 2022-04-04 NOTE — Progress Notes (Signed)
Speech Language Pathology Treatment: Cognitive-Linquistic  Patient Details Name: Sandra ERKKILA MRN: [id number] DOB: 03/14/1966 Today's Date: 04/04/2022 Time: 2703-5009 SLP Time Calculation (min) (ACUTE ONLY): 16 min  Assessment / Plan / Recommendation Clinical Impression  Pt was seen for f/u with emphasis on differential diagnosis of cognitive abilities now that she is more alert. SLUMS was administered with a score of 25/30, which falls within the range of mild cognitive impairment. She had the most difficulty with remember details from paragraph level, and also shares that she has been having trouble processing and remembering what people have been telling her since she has been in the hospital. We discussed some strategies for attention and memory, but recommend AIR level therapy upon discharge.    HPI HPI: Pt is a 56 yo female presenting initially with CP and found to have NSTEMI, who then developed L sided weakness and numbness. She did receive TNK. MRI revealed small, scattered infarcts in the R cerebral cortex and thalambus. PMH includes: DM, CAD, NSTEMI, recent PCI      SLP Plan  Continue with current plan of care      Recommendations for follow up therapy are one component of a multi-disciplinary discharge planning process, led by the attending physician.  Recommendations may be updated based on patient status, additional functional criteria and insurance authorization.    Recommendations                   Follow Up Recommendations: Acute inpatient rehab (3hours/day) Assistance recommended at discharge: Intermittent Supervision/Assistance SLP Visit Diagnosis: Cognitive communication deficit (R41.841);Dysarthria and anarthria (R47.1) Plan: Continue with current plan of care           Mahala Menghini., M.A. CCC-SLP Acute Rehabilitation Services Office [phone]  Secure chat preferred   04/04/2022, 2:01 PM

## 2022-04-04 NOTE — Plan of Care (Signed)
Goal: Knowledge of General Education information will improve Description: Including pain rating scale, medication(s)/side effects and non-pharmacologic comfort measures Outcome: Progressing   Problem: Clinical Measurements: Goal: Ability to maintain clinical measurements within normal limits will improve Outcome: Progressing Goal: Cardiovascular complication will be avoided Outcome: Progressing   Problem: Activity: Goal: Risk for activity intolerance will decrease Outcome: Progressing   Problem: Nutrition: Goal: Adequate nutrition will be maintained Outcome: Progressing   Problem: Coping: Goal: Level of anxiety will decrease Outcome: Progressing   Problem: Pain Managment: Goal: General experience of comfort will improve Outcome: Progressing   Problem: Safety: Goal: Ability to remain free from injury will improve Outcome: Progressing   Problem: Cardiovascular: Goal: Ability to achieve and maintain adequate cardiovascular perfusion will improve Outcome: Progressing Goal: Vascular access site(s) Level 0-1 will be maintained Outcome: Progressing   Problem: Education: Goal: Knowledge of disease or condition will improve Outcome: Progressing

## 2022-04-04 NOTE — Progress Notes (Signed)
Physical Therapy Treatment Patient Details Name: [name] MRN: [id number] DOB: 30-Sep-1966 Today's Date: 04/04/2022   History of Present Illness 56 yo F admitted 5/27 as code STEMI, taken for LHC.  On 5/28 pt had L sided sensory loss. Code stroke activated.  Received tnk. After tnk had oral mucosal bleeding, HA, chest pain, R forearm pain and swelling at site of LHC access. MRI - Small scattered acute infarcts in the cerebral cortex and right thalamus. PMhx: polyneuropathy, obesity, HLD, CAD, DM, HTN    PT Comments    Pt up in recliner on entry voices concern about having to go to AIR in [place] due to her insurance. PT provided assurance that AIR will provide the most appropriate level of rehab for her CVA. Pt reports Purewick not working and that she is wet. Pt able to stand and provide self pericare with min A for steadying at RW. After clean and dry pads placed, pt worked on seated exercises and standing weightshifts. Pt then sat with decreased control reporting she does not want for her L LE to stick up when she sits back down. Worked on chair push ups with controlled lowering with L knee in flexion and pt able to perform stand to sit with L knee in flexion while she came to sitting. D/c plans remain appropriate at this time. PT will continue to follow acutely.    Recommendations for follow up therapy are one component of a multi-disciplinary discharge planning process, led by the attending physician.  Recommendations may be updated based on patient status, additional functional criteria and insurance authorization.  Follow Up Recommendations  Acute inpatient rehab (3hours/day)     Assistance Recommended at Discharge Frequent or constant Supervision/Assistance  Patient can return home with the following A lot of help with bathing/dressing/bathroom;Assistance with cooking/housework;Assist for transportation;Help with stairs or ramp for entrance;A lot of help with walking and/or  transfers   Equipment Recommendations  Rolling walker (2 wheels)    Recommendations for Other Services Rehab consult     Precautions / Restrictions Precautions Precautions: Fall Precaution Comments: 5/27 radial heart cath Restrictions Weight Bearing Restrictions: No     Mobility  Bed Mobility               General bed mobility comments: up in recliner on entry    Transfers Overall transfer level: Needs assistance Equipment used: Rolling walker (2 wheels) Transfers: Sit to/from Stand Sit to Stand: Min assist, Min guard           General transfer comment: sit<>stand x 4 decreased eccentric lowering trying to avoid L LE full extension, with practice able to sit without L LE extension                           Balance Overall balance assessment: Needs assistance         Standing balance support: Bilateral upper extremity supported, During functional activity, Single extremity supported Standing balance-Leahy Scale: Poor Standing balance comment: able to provide self pericare in standing                            Cognition Arousal/Alertness: Awake/alert Behavior During Therapy: WFL for tasks assessed/performed Overall Cognitive Status: Impaired/Different from baseline Area of Impairment: Following commands, Safety/judgement, Awareness, Problem solving                       Following  Commands: Follows one step commands consistently, Follows one step commands with increased time Safety/Judgement: Decreased awareness of safety, Decreased awareness of deficits Awareness: Emergent Problem Solving: Requires verbal cues, Difficulty sequencing General Comments: pt continues to require cueing for safety and sequencing during transfer and ADLs        Exercises General Exercises - Lower Extremity Short Arc Quad: AAROM, Left, Seated, 5 reps (if knee does not come to full extension pt able to perform AROM) Other Exercises Other  Exercises: weight shifting L<>R and ant<>posterior Other Exercises: chair push ups with knee in flexed position    General Comments General comments (skin integrity, edema, etc.): VSS      Pertinent Vitals/Pain Pain Assessment Pain Assessment: No/denies pain     PT Goals (current goals can now be found in the care plan section) Acute Rehab PT Goals PT Goal Formulation: With patient Time For Goal Achievement: 04/14/22 Potential to Achieve Goals: Good Progress towards PT goals: Progressing toward goals    Frequency    Min 4X/week      PT Plan Current plan remains appropriate       AM-PAC PT "6 Clicks" Mobility   Outcome Measure  Help needed turning from your back to your side while in a flat bed without using bedrails?: A Danner Help needed moving from lying on your back to sitting on the side of a flat bed without using bedrails?: A Lot Help needed moving to and from a bed to a chair (including a wheelchair)?: A Bowie Help needed standing up from a chair using your arms (e.g., wheelchair or bedside chair)?: A Alaimo Help needed to walk in hospital room?: A Lot Help needed climbing 3-5 steps with a railing? : Total 6 Click Score: 14    End of Session Equipment Utilized During Treatment: Gait belt Activity Tolerance: Patient tolerated treatment well Patient left: in chair;with call bell/phone within reach;with chair alarm set;with family/visitor present Nurse Communication: Mobility status PT Visit Diagnosis: Other abnormalities of gait and mobility (R26.89);Other symptoms and signs involving the nervous system (R29.898);Hemiplegia and hemiparesis Hemiplegia - Right/Left: Left Hemiplegia - dominant/non-dominant: Dominant Hemiplegia - caused by: Cerebral infarction     Time: 1432-1500 PT Time Calculation (min) (ACUTE ONLY): 28 min  Charges:  $Therapeutic Exercise: 8-22 mins $Therapeutic Activity: 8-22 mins                     Lawerence Dery B. Beverely Risen PT,  DPT Acute Rehabilitation Services Please use secure chat or  Call Office [phone]    [facility]on Alas [facility] 04/04/2022, 3:18 PM

## 2022-04-04 NOTE — Progress Notes (Signed)
Inpatient Diabetes Program Recommendations  AACE/ADA: New Consensus Statement on Inpatient Glycemic Control (2015)  Target Ranges:  Prepandial:   less than 140 mg/dL      Peak postprandial:   less than 180 mg/dL (1-2 hours)      Critically ill patients:  140 - 180 mg/dL   Lab Results  Component Value Date   GLUCAP 140 (H) 04/04/2022   HGBA1C 12.8 (H) 03/31/2022    Review of Glycemic Control  Latest Reference Range & Units 04/03/22 11:52 04/03/22 15:58 04/03/22 21:55 04/04/22 06:05 04/04/22 08:47  Glucose-Capillary 70 - 99 mg/dL 162 (H) 128 (H) 224 (H) 176 (H) 140 (H)  (H): Data is abnormally high Diabetes history: DM 2 Outpatient Diabetes medications: currently no meds, was on Glipizide 10 mg bid in the past and Trulicity Current orders for Inpatient glycemic control:  Semglee 14 units bid Novolog 0-20 units tid & HS Novolog 6 units tid meal coverage Jardiance 10 mg QD   A1c 12.8% on 5/29   Inpatient Diabetes Program Recommendations:     Consider increasing Semglee 16 units BID.   Pt has been to [facility] in the past.    Note: pt without insurance. May need 70/30 insulin at time of discharge for cost effectiveness.   D/c needs for Diabetes: Glucose meter kit order #[id number]  Thanks, Bronson Curb, MSN, RNC-OB Diabetes Coordinator [phone] (8a-5p)

## 2022-04-04 NOTE — Progress Notes (Signed)
[facility] IP rehab admissions - I met with patient along with Kathlee Nations, SW.  Discussed options for rehab.  Patient is not in network with [facility] inpatient rehab.  I cannot get authorization for CIR from her insurance carrier.  Kathlee Nations will explore options with patient and find a rehab venue for patient.  I will sign off for [facility] CIR at this time.  Call for questions.  [phone]

## 2022-04-05 LAB — BASIC METABOLIC PANEL
Anion gap: 5 (ref 5–15)
BUN: 17 mg/dL (ref 6–20)
CO2: 24 mmol/L (ref 22–32)
Calcium: 8.7 mg/dL — ABNORMAL LOW (ref 8.9–10.3)
Chloride: 111 mmol/L (ref 98–111)
Creatinine, Ser: 0.92 mg/dL (ref 0.44–1.00)
GFR, Estimated: >60 mL/min (ref >60)
Glucose, Bld: 180 mg/dL — ABNORMAL HIGH (ref 70–99)
Potassium: 3.9 mmol/L (ref 3.5–5.1)
Sodium: 140 mmol/L (ref 135–145)

## 2022-04-05 LAB — GLUCOSE, CAPILLARY
Glucose-Capillary: 162 mg/dL — ABNORMAL HIGH (ref 70–99)
Glucose-Capillary: 136 mg/dL — ABNORMAL HIGH (ref 70–99)
Glucose-Capillary: 116 mg/dL — ABNORMAL HIGH (ref 70–99)
Glucose-Capillary: 175 mg/dL — ABNORMAL HIGH (ref 70–99)

## 2022-04-05 LAB — CBC
HCT: 32.5 % — ABNORMAL LOW (ref 36.0–46.0)
Hemoglobin: 10.9 g/dL — ABNORMAL LOW (ref 12.0–15.0)
MCH: 32.2 pg (ref 26.0–34.0)
MCHC: 33.5 g/dL (ref 30.0–36.0)
MCV: 96.2 fL (ref 80.0–100.0)
Platelets: 211 10*3/uL (ref 150–400)
RBC: 3.38 MIL/uL — ABNORMAL LOW (ref 3.87–5.11)
RDW: 12.4 % (ref 11.5–15.5)
WBC: 6.6 10*3/uL (ref 4.0–10.5)
nRBC: 0 % (ref 0.0–0.2)

## 2022-04-05 NOTE — Plan of Care (Signed)
?  Problem: Education: ?Goal: Knowledge of disease or condition will improve ?Outcome: Progressing ?Goal: Knowledge of secondary prevention will improve (SELECT ALL) ?Outcome: Progressing ?Goal: Knowledge of patient specific risk factors will improve (INDIVIDUALIZE FOR PATIENT) ?Outcome: Progressing ?  ?

## 2022-04-05 NOTE — Progress Notes (Signed)
Physical Therapy Treatment Patient Details Name: [name] MRN: [id number] DOB: 25-Jun-1966 Today's Date: 04/05/2022   History of Present Illness 56 yo F admitted 5/27 as code STEMI, taken for LHC.  On 5/28 pt had L sided sensory loss. Code stroke activated.  Received tnk. After tnk had oral mucosal bleeding, HA, chest pain, R forearm pain and swelling at site of LHC access. MRI - Small scattered acute infarcts in the cerebral cortex and right thalamus. PMhx: polyneuropathy, obesity, HLD, CAD, DM, HTN    PT Comments    Making steady progress with therapy, ambulates min-mod assist with RW today 10 feet including turns x2. Transfer training tolerated well, demonstrating good carryover with sequencing and safety. Patient will continue to benefit from skilled physical therapy services to further improve independence with functional mobility.'    Recommendations for follow up therapy are one component of a multi-disciplinary discharge planning process, led by the attending physician.  Recommendations may be updated based on patient status, additional functional criteria and insurance authorization.  Follow Up Recommendations  Acute inpatient rehab (3hours/day)     Assistance Recommended at Discharge Frequent or constant Supervision/Assistance  Patient can return home with the following A lot of help with bathing/dressing/bathroom;Assistance with cooking/housework;Assist for transportation;Help with stairs or ramp for entrance;A lot of help with walking and/or transfers   Equipment Recommendations  Rolling walker (2 wheels)    Recommendations for Other Services Rehab consult     Precautions / Restrictions Precautions Precautions: Fall Precaution Comments: 5/27 radial heart cath Restrictions Weight Bearing Restrictions: No     Mobility  Bed Mobility Overal bed mobility: Needs Assistance Bed Mobility: Supine to Sit, Sit to Supine     Supine to sit: Min assist, HOB elevated Sit  to supine: Min assist   General bed mobility comments: Requires extra time, cues for sequencing. Assist with LLE in and out of bed. Significant quad tone LLE.    Transfers Overall transfer level: Needs assistance Equipment used: Rolling walker (2 wheels) Transfers: Sit to/from Stand, Bed to chair/wheelchair/BSC Sit to Stand: Min assist Stand pivot transfers: Min assist Step pivot transfers: Min assist       General transfer comment: Min assist for boost to stand from bed x3, BSC x1, and standard height chair x1. Pt recalls appropriate hand placement 75% of the time. Needs assist for Lt knee flexion to place Lt foot onto floor, onces flexed to 90 deg extensor tone seems to decrease signifciantly. Min assist for boost to standing position with walker. Min assist for pivot transfer to and from [facility].    Ambulation/Gait Ambulation/Gait assistance: Min assist (mod assist to help normalize L LE) Gait Distance (Feet): 10 Feet (x2) Assistive device: Rolling walker (2 wheels) Gait Pattern/deviations: Step-to pattern, Decreased stride length (hemi paretic gait with Lt quad extensor tone, lack of knee control in WB at times.) Gait velocity: slow   Pre-gait activities: Standing weight shift. General Gait Details: LLE extensor tone, cues for sequencing and tactile facilitation for weight shifting. LLE sliding foot forward on floor due to inability to DF adequately and flex knee in swing phase of gait. Mild instability noted in Lt knee at times but holds herself adequately with UE support. Some further assist with walker placement for safe proximity in the event that LLE buckles due to lack of proprioceptive awareness of LLE.   Stairs             Wheelchair Mobility    Modified Rankin (Stroke Patients Only) Modified  Rankin (Stroke Patients Only) Pre-Morbid Rankin Score: No symptoms Modified Rankin: Moderately severe disability     Balance Overall balance assessment: Needs assistance    Sitting balance-Leahy Scale: Fair     Standing balance support: During functional activity, No upper extremity supported Standing balance-Leahy Scale: Fair Standing balance comment: Brief period without UE support, requires very close guard due to lack of proprioceptive awareness in LLE.                            Cognition Arousal/Alertness: Awake/alert Behavior During Therapy: WFL for tasks assessed/performed Overall Cognitive Status: Impaired/Different from baseline Area of Impairment: Following commands, Safety/judgement, Awareness, Problem solving                       Following Commands: Follows one step commands consistently, Follows one step commands with increased time Safety/Judgement: Decreased awareness of safety, Decreased awareness of deficits Awareness: Emergent Problem Solving: Requires verbal cues, Difficulty sequencing General Comments: pt continues to require cueing for safety and sequencing during transfer and ADLs        Exercises General Exercises - Lower Extremity Short Arc Quad:  (if knee does not come to full extension pt able to perform AROM) Other Exercises Other Exercises: AAROM/PROM Lt knee flexion with cues focused effort and visualization. Other Exercises: bil ankle pumps (limited on Lt) but definite volitional movement of Lt foot.    General Comments General comments (skin integrity, edema, etc.): vss      Pertinent Vitals/Pain Pain Assessment Pain Assessment: No/denies pain    Home Living                          Prior Function            PT Goals (current goals can now be found in the care plan section) Acute Rehab PT Goals Patient Stated Goal: Get well. PT Goal Formulation: With patient Time For Goal Achievement: 04/14/22 Potential to Achieve Goals: Good Progress towards PT goals: Progressing toward goals    Frequency    Min 4X/week      PT Plan Current plan remains appropriate     Co-evaluation              AM-PAC PT "6 Clicks" Mobility   Outcome Measure  Help needed turning from your back to your side while in a flat bed without using bedrails?: A Kuenzi Help needed moving from lying on your back to sitting on the side of a flat bed without using bedrails?: A Lyles Help needed moving to and from a bed to a chair (including a wheelchair)?: A Hamblin Help needed standing up from a chair using your arms (e.g., wheelchair or bedside chair)?: A Mccorkel Help needed to walk in hospital room?: A Lot Help needed climbing 3-5 steps with a railing? : Total 6 Click Score: 15    End of Session Equipment Utilized During Treatment: Gait belt Activity Tolerance: Patient tolerated treatment well Patient left: with call bell/phone within reach;with family/visitor present;in bed;with SCD's reapplied Nurse Communication: Mobility status PT Visit Diagnosis: Other abnormalities of gait and mobility (R26.89);Other symptoms and signs involving the nervous system (R29.898);Hemiplegia and hemiparesis Hemiplegia - Right/Left: Left Hemiplegia - dominant/non-dominant: Dominant ((Pt reports Rt handed this visit)) Hemiplegia - caused by: Cerebral infarction     Time: 7829-5621 PT Time Calculation (min) (ACUTE ONLY): 28 min  Charges:  $Gait Training: 8-22  mins $Therapeutic Activity: 8-22 mins                     [name], PT    [name] 04/05/2022, 4:29 PM

## 2022-04-05 NOTE — Plan of Care (Signed)
Patient is doing well. No complaints of chest pain during the shift. Numbness on her left side still the same as per patient. Will continue to monitor.   Problem: Education: Goal: Knowledge of General Education information will improve Description: Including pain rating scale, medication(s)/side effects and non-pharmacologic comfort measures Outcome: Progressing   Problem: Health Behavior/Discharge Planning: Goal: Ability to manage health-related needs will improve Outcome: Progressing   Problem: Safety: Goal: Ability to remain free from injury will improve Outcome: Progressing   Problem: Skin Integrity: Goal: Risk for impaired skin integrity will decrease Outcome: Progressing   Problem: Education: Goal: Knowledge of disease or condition will improve Outcome: Progressing Goal: Knowledge of secondary prevention will improve (SELECT ALL) Outcome: Progressing Goal: Knowledge of patient specific risk factors will improve (INDIVIDUALIZE FOR PATIENT) Outcome: Progressing

## 2022-04-05 NOTE — Progress Notes (Signed)
Pain Progress Note  Patient: [name] [id number] DOB: 1965-11-17  DOA: 03/29/2022  DOS: 04/05/2022   56 year old female with past medical history of coronary artery disease, diabetes mellitus type 2, noncompliance with medication, GERD presented to hospital with hematemesis chest pain and GERD symptoms.  Patient was admitted to the hospital for evaluation of chest pain, ruled in for NSTEMI, underwent cardiac catheterization with stent placement and was cleared by cardiology for discharge.  Hospitalization was notable for hyperglycemia with hemoglobin A1c 12.3, started on insulin. Discharge was contemplated 5/28, patient developed sudden onset left-sided numbness and weakness, code stroke was called.  Patient was then transferred to the neuro ICU and TNK was administered.  Patient was subsequently transferred out of the ICU to the medical team.  Assessment and Plan: Principal Problem:   NSTEMI (non-ST elevated myocardial infarction) [facility]) Active Problems:   Left sided numbness   CAD S/P percutaneous coronary angioplasty   Uncontrolled type 2 diabetes mellitus with hyperglycemia, without long-term current use of insulin (HCC)   Hematemesis   Cystic disease of liver   Essential hypertension, benign   Hyperlipidemia   Obesity (BMI 30-39.9)   Chest pain   Stroke (cerebrum) ([place])   NSTEMI, CAD: Status post left heart catheterization with stent placement.  Anaphylaxis to aspirin so we will continue Plavix Crestor and lisinopril.  Stable at this time.  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  Thought to be secondary to recent catheterization/ cryptogenic in nature.  No cardioembolic source noted on 2D echocardiogram.  TEE done on 04/02/2022 showed LV ejection fraction of 60 to 65% with no atrial thrombus.  Bubble study was negative as well.  Plan for 30-day monitoring on discharge. Physical therapy has recommended CIR at this time.  Neurology has signed  off at this time.  Recommendation is to continue Plavix and statin.  Patient continues to have left-sided numbness and symptoms.   Uncontrolled type 2 diabetes mellitus with hyperglycemia, without long-term current use of insulin ([place]): Latest HbA1c 12.5%.  Continue basal and sliding scale insulin.  Patient would benefit from  semaglutide as outpatient  given concomitant obesity.    Cystic disease of liver Will need to follow-up with GI as outpatient.  Significance unknown at this time.   Hematemesis Resolved at this time.  Continue PPI.   Essential HTN:  Continue Coreg.  Lisinopril has been changed to losartan as per cardiology.  Blood pressure seems to be stable at this time.  Latest blood pressure of 105/68  Hyperlipidemia.  Latest LDL 173. Continue Crestor.  LDL goal less than 55.   Obesity: Body mass index is 33.28 kg/m. Could benefit from GLP agonist due to diabetes and obesity.   RUE edema: Negative venous and arterial U/S.  Ecchymosis noted but without induration.  Improving.   Enlarged thyroid gland. Plan for thyroid U/S as outpatient.   Debility deconditioning.  At this time, patient has been considered for CIR.   Subjective:  Today, patient was seen and examined at bedside.  Had some numbness over the face and left side of the body yesterday.  States that it comes and goes.  No chest pain today.  Objective: Vitals:   04/04/22 1949 04/04/22 2307 04/05/22 0310 04/05/22 0817  BP: (!) 152/70 (!) 145/66 134/62 105/68  Pulse: 68 63 65 63  Resp: 20 16 16 20   Temp: 98.2 F (36.8 C) 97.8 F (36.6 C) 97.7 F (36.5 C) 98.1 F (36.7 C)  TempSrc: Oral  Oral Oral Oral  SpO2: 98% 99% 97% 96%  Weight:      Height:       General: Obese built, not in obvious distress HENT:   No scleral pallor or icterus noted. Oral mucosa is moist.  Chest:  Clear breath sounds.  Diminished breath sounds bilaterally. No crackles or wheezes.  CVS: S1 &S2 heard. No murmur.  Regular rate and  rhythm. Abdomen: Soft, nontender, nondistended.  Bowel sounds are heard.   Extremities: No cyanosis, clubbing or edema.  Peripheral pulses are palpable.  Right forearm ecchymosis. Psych: Alert, awake and oriented, normal mood CNS: Decreased sensation on the left side of the body.  Decreased strength of the left lower extremity. Skin: Warm and dry.  No rashes noted.  Echymosis on the right forearm  Data Personally reviewed:  CBC: Recent Labs  Lab 04/01/22 0347 04/02/22 0327 04/03/22 0329 04/04/22 0308 04/05/22 0055  WBC 7.6 7.7 6.5 7.3 6.6  HGB 11.9* 12.1 11.4* 10.6* 10.9*  HCT 34.2* 36.0 34.0* 30.9* 32.5*  MCV 94.0 95.2 96.0 94.5 96.2  PLT 185 204 211 208 [id number]   Basic Metabolic Panel: Recent Labs  Lab 03/30/22 1750 03/31/22 0301 04/01/22 0347 04/02/22 0327 04/03/22 0329 04/04/22 0308 04/05/22 0055  NA  --    < > 136 138 140 142 140  K  --    < > 4.0 3.7 3.5 3.6 3.9  CL  --    < > 104 107 111 113* 111  CO2  --    < > 25 25 23 23 24   GLUCOSE  --    < > 263* 154* 132* 95 180*  BUN  --    < > 20 23* 21* 15 17  CREATININE  --    < > 0.90 1.02* 0.78 0.79 0.92  CALCIUM  --    < > 8.9 9.3 8.5* 8.5* 8.7*  MG 2.0  --   --   --   --   --   --   PHOS 3.6  --   --   --   --   --   --    < > = values in this interval not displayed.   GFR: Estimated Creatinine Clearance: 75.1 mL/min (by C-G formula based on SCr of 0.92 mg/dL). Liver Function Tests: No results for input(s): AST, ALT, ALKPHOS, BILITOT, PROT, ALBUMIN in the last 168 hours. No results for input(s): LIPASE, AMYLASE in the last 168 hours. No results for input(s): AMMONIA in the last 168 hours. Coagulation Profile: No results for input(s): INR, PROTIME in the last 168 hours. Cardiac Enzymes: No results for input(s): CKTOTAL, CKMB, CKMBINDEX, TROPONINI in the last 168 hours. BNP (last 3 results) No results for input(s): PROBNP in the last 8760 hours. HbA1C: No results for input(s): HGBA1C in the last 72  hours.  CBG: Recent Labs  Lab 04/04/22 0847 04/04/22 1217 04/04/22 1652 04/04/22 2114 04/05/22 0602  GLUCAP 140* 164* 212* 176* 162*   Lipid Profile: No results for input(s): CHOL, HDL, LDLCALC, TRIG, CHOLHDL, LDLDIRECT in the last 72 hours.  Thyroid Function Tests: No results for input(s): TSH, T4TOTAL, FREET4, T3FREE, THYROIDAB in the last 72 hours. Anemia Panel: No results for input(s): VITAMINB12, FOLATE, FERRITIN, TIBC, IRON, RETICCTPCT in the last 72 hours. Urine analysis:    Component Value Date/Time   COLORURINE YELLOW 11/15/2019 0426   APPEARANCEUR CLEAR 11/15/2019 0426   LABSPEC >=1.030 03/16/2021 1640   PHURINE 5.5 03/16/2021 1640  GLUCOSEU 100 (A) 03/16/2021 1640   HGBUR NEGATIVE 03/16/2021 1640   BILIRUBINUR NEGATIVE 03/16/2021 1640   KETONESUR NEGATIVE 03/16/2021 1640   PROTEINUR NEGATIVE 03/16/2021 1640   UROBILINOGEN 0.2 03/16/2021 1640   NITRITE NEGATIVE 03/16/2021 1640   LEUKOCYTESUR SMALL (A) 03/16/2021 1640   Recent Results (from the past 240 hour(s))  MRSA Next Gen by PCR, Nasal     Status: Abnormal   Collection Time: 03/29/22  4:59 PM   Specimen: Nasal Mucosa; Nasal Swab  Result Value Ref Range Status   MRSA by PCR Next Gen DETECTED (A) NOT DETECTED Final    Comment: RESULT CALLED TO, READ BACK BY AND VERIFIED WITH: S,KING RN @1920  03/29/22 E,[place] (NOTE) The GeneXpert MRSA Assay (FDA approved for NASAL specimens only), is one component of a comprehensive MRSA colonization surveillance program. It is not intended to diagnose MRSA infection nor to guide or monitor treatment for MRSA infections. Test performance is not FDA approved in patients less than 46 years old. Performed at [facility], [place] [address] [place], [place] [address]      No results found.  Family Communication: Spoke with the patient at bedside.  Disposition: Status is: Inpatient  Disposition.   Awaiting CIR disposition , will need 30-day event monitoring on  discharge.  Planned Discharge Destination: Rehab  Flora Lipps, MD 04/05/2022 10:22 AM Page by [email or web address]

## 2022-04-06 LAB — GLUCOSE, CAPILLARY
Glucose-Capillary: 134 mg/dL — ABNORMAL HIGH (ref 70–99)
Glucose-Capillary: 224 mg/dL — ABNORMAL HIGH (ref 70–99)
Glucose-Capillary: 149 mg/dL — ABNORMAL HIGH (ref 70–99)
Glucose-Capillary: 228 mg/dL — ABNORMAL HIGH (ref 70–99)

## 2022-04-06 NOTE — Plan of Care (Signed)

## 2022-04-06 NOTE — Progress Notes (Signed)
Pain Progress Note  Patient: [name] [id number] DOB: 06-Sep-1966  DOA: 03/29/2022  DOS: 04/06/2022   56 year old female with past medical history of coronary artery disease, diabetes mellitus type 2, noncompliance with medication, GERD presented to hospital with hematemesis chest pain and GERD symptoms.  Patient was admitted to the hospital for evaluation of chest pain, ruled in for NSTEMI, underwent cardiac catheterization with stent placement and was cleared by cardiology for discharge.  Hospitalization was notable for hyperglycemia with hemoglobin A1c 12.3, started on insulin. Discharge was contemplated 5/28, patient developed sudden onset left-sided numbness and weakness, code stroke was called.  Patient was then transferred to the neuro ICU and TNK was administered.  Patient was subsequently transferred out of the ICU to the medical team.  Assessment and Plan: Principal Problem:   NSTEMI (non-ST elevated myocardial infarction) [facility]) Active Problems:   Left sided numbness   CAD S/P percutaneous coronary angioplasty   Uncontrolled type 2 diabetes mellitus with hyperglycemia, without long-term current use of insulin (HCC)   Hematemesis   Cystic disease of liver   Essential hypertension, benign   Hyperlipidemia   Obesity (BMI 30-39.9)   Chest pain   Stroke (cerebrum) ([place])   NSTEMI, CAD: Status post left heart catheterization with stent placement.  Anaphylaxis to aspirin so we will continue Plavix Crestor and lisinopril.  Stable at this time.  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  Thought to be secondary to recent catheterization/ cryptogenic in nature.  No cardioembolic source noted on 2D echocardiogram.  TEE done on 04/02/2022 showed LV ejection fraction of 60 to 65% with no atrial thrombus.  Bubble study was negative as well.  Plan for 30-day monitoring on discharge. Physical therapy has recommended CIR at this time.  Neurology has signed  off at this time.  Recommendation is to continue Plavix and statin.  Still has left-sided weakness and numbness.  Uncontrolled type 2 diabetes mellitus with hyperglycemia, without long-term current use of insulin ([place]): Latest HbA1c 12.5%.  Continue basal and sliding scale insulin.  Patient would benefit from  semaglutide as outpatient  given concomitant obesity.    Cystic disease of liver Will need to follow-up with GI as outpatient.  Significance unknown at this time.   Hematemesis Resolved.  Continue PPI   Essential HTN:  Continue Coreg.  Lisinopril has been changed to losartan as per cardiology.  Blood pressure seems to be stable at this time.  Latest blood pressure of 105/68  Hyperlipidemia.  Latest LDL 173. Continue Crestor.  LDL goal less than 55.   Obesity: Body mass index is 33.28 kg/m. Could benefit from GLP agonist due to diabetes and obesity.   RUE edema: Negative venous and arterial U/S.  Mild ecchymosis improving.  Enlarged thyroid gland. Plan for thyroid U/S as outpatient.   Debility deconditioning.  At this time, patient has been considered for CIR.   Subjective:  Today, patient was seen and examined at bedside.  Still complains of mild numbness on the left side of the body.  No chest pain shortness of breath.     Objective: Vitals:   04/05/22 1936 04/05/22 2345 04/06/22 0343 04/06/22 0700  BP: (!) 160/80 133/72 135/66 119/67  Pulse: 73 64 64 60  Resp: 20 18 18 17   Temp: 98.9 F (37.2 C) 98 F (36.7 C) 98.7 F (37.1 C) 98.5 F (36.9 C)  TempSrc: Oral Oral Oral Oral  SpO2: 99% 100% 97% 96%  Weight:  Height:       General: Obese built, not in obvious distress HENT:   No scleral pallor or icterus noted. Oral mucosa is moist.  Chest:  Clear breath sounds.  Diminished breath sounds bilaterally. No crackles or wheezes.  CVS: S1 &S2 heard. No murmur.  Regular rate and rhythm. Abdomen: Soft, nontender, nondistended.  Bowel sounds are heard.   Extremities:  No cyanosis, clubbing or edema.  Peripheral pulses are palpable.  Right forearm with some ecchymosis Psych: Alert, awake and oriented, normal mood CNS: Decree sensation on the left side of the body.  Decreased strength of the left lower extremity. Skin: Warm and dry.  No rashes noted.  Data Personally reviewed:  CBC: Recent Labs  Lab 04/01/22 0347 04/02/22 0327 04/03/22 0329 04/04/22 0308 04/05/22 0055  WBC 7.6 7.7 6.5 7.3 6.6  HGB 11.9* 12.1 11.4* 10.6* 10.9*  HCT 34.2* 36.0 34.0* 30.9* 32.5*  MCV 94.0 95.2 96.0 94.5 96.2  PLT 185 204 211 208 [id number]   Basic Metabolic Panel: Recent Labs  Lab 03/30/22 1750 03/31/22 0301 04/01/22 0347 04/02/22 0327 04/03/22 0329 04/04/22 0308 04/05/22 0055  NA  --    < > 136 138 140 142 140  K  --    < > 4.0 3.7 3.5 3.6 3.9  CL  --    < > 104 107 111 113* 111  CO2  --    < > 25 25 23 23 24   GLUCOSE  --    < > 263* 154* 132* 95 180*  BUN  --    < > 20 23* 21* 15 17  CREATININE  --    < > 0.90 1.02* 0.78 0.79 0.92  CALCIUM  --    < > 8.9 9.3 8.5* 8.5* 8.7*  MG 2.0  --   --   --   --   --   --   PHOS 3.6  --   --   --   --   --   --    < > = values in this interval not displayed.   GFR: Estimated Creatinine Clearance: 75.1 mL/min (by C-G formula based on SCr of 0.92 mg/dL). Liver Function Tests: No results for input(s): AST, ALT, ALKPHOS, BILITOT, PROT, ALBUMIN in the last 168 hours. No results for input(s): LIPASE, AMYLASE in the last 168 hours. No results for input(s): AMMONIA in the last 168 hours. Coagulation Profile: No results for input(s): INR, PROTIME in the last 168 hours. Cardiac Enzymes: No results for input(s): CKTOTAL, CKMB, CKMBINDEX, TROPONINI in the last 168 hours. BNP (last 3 results) No results for input(s): PROBNP in the last 8760 hours. HbA1C: No results for input(s): HGBA1C in the last 72 hours.  CBG: Recent Labs  Lab 04/05/22 0602 04/05/22 1112 04/05/22 1652 04/05/22 2149 04/06/22 0607  GLUCAP 162* 136*  116* 175* 134*   Lipid Profile: No results for input(s): CHOL, HDL, LDLCALC, TRIG, CHOLHDL, LDLDIRECT in the last 72 hours.  Thyroid Function Tests: No results for input(s): TSH, T4TOTAL, FREET4, T3FREE, THYROIDAB in the last 72 hours. Anemia Panel: No results for input(s): VITAMINB12, FOLATE, FERRITIN, TIBC, IRON, RETICCTPCT in the last 72 hours. Urine analysis:    Component Value Date/Time   COLORURINE YELLOW 11/15/2019 0426   APPEARANCEUR CLEAR 11/15/2019 0426   LABSPEC >=1.030 03/16/2021 1640   PHURINE 5.5 03/16/2021 1640   GLUCOSEU 100 (A) 03/16/2021 1640   HGBUR NEGATIVE 03/16/2021 1640   BILIRUBINUR NEGATIVE 03/16/2021 1640   KETONESUR  NEGATIVE 03/16/2021 1640   PROTEINUR NEGATIVE 03/16/2021 1640   UROBILINOGEN 0.2 03/16/2021 1640   NITRITE NEGATIVE 03/16/2021 1640   LEUKOCYTESUR SMALL (A) 03/16/2021 1640   Recent Results (from the past 240 hour(s))  MRSA Next Gen by PCR, Nasal     Status: Abnormal   Collection Time: 03/29/22  4:59 PM   Specimen: Nasal Mucosa; Nasal Swab  Result Value Ref Range Status   MRSA by PCR Next Gen DETECTED (A) NOT DETECTED Final    Comment: RESULT CALLED TO, READ BACK BY AND VERIFIED WITH: S,KING RN @1920  03/29/22 E,[place] (NOTE) The GeneXpert MRSA Assay (FDA approved for NASAL specimens only), is one component of a comprehensive MRSA colonization surveillance program. It is not intended to diagnose MRSA infection nor to guide or monitor treatment for MRSA infections. Test performance is not FDA approved in patients less than 94 years old. Performed at [facility], [place] [address], [place], [place] [address]      No results found.  Family Communication: Spoke with the patient at bedside.  Disposition: Status is: Inpatient  Disposition.   Awaiting CIR disposition , will need 30-day event monitoring on discharge.  Planned Discharge Destination: Rehab  Flora Lipps, MD 04/06/2022 9:55 AM Page by [email or web address]

## 2022-04-07 LAB — GLUCOSE, CAPILLARY
Glucose-Capillary: 149 mg/dL — ABNORMAL HIGH (ref 70–99)
Glucose-Capillary: 134 mg/dL — ABNORMAL HIGH (ref 70–99)
Glucose-Capillary: 235 mg/dL — ABNORMAL HIGH (ref 70–99)
Glucose-Capillary: 176 mg/dL — ABNORMAL HIGH (ref 70–99)

## 2022-04-07 NOTE — Progress Notes (Signed)
Physical Therapy Treatment Patient Details Name: [name] MRN: [id number] DOB: 06-16-1966 Today's Date: 04/07/2022   History of Present Illness 56 yo F admitted 5/27 as code STEMI, taken for LHC.  On 5/28 pt had L sided sensory loss. Code stroke activated.  Received tnk. After tnk had oral mucosal bleeding, HA, chest pain, R forearm pain and swelling at site of LHC access. MRI - Small scattered acute infarcts in the cerebral cortex and right thalamus. PMhx: polyneuropathy, obesity, HLD, CAD, DM, HTN    PT Comments    Pt with improved cognition today and remains very motivated to improve function and "walk again." With manual assist at L knee pt with improved ambulation tolerance. Pt's L LE remains to have extensor tone requiring tactile assist to bend at the knee. Pt given HEP for when in bed or chair. Acute PT to cont to follow. Pt to benefit from aggressive rehab to progress towards indep.    Recommendations for follow up therapy are one component of a multi-disciplinary discharge planning process, led by the attending physician.  Recommendations may be updated based on patient status, additional functional criteria and insurance authorization.  Follow Up Recommendations  Acute inpatient rehab (3hours/day)     Assistance Recommended at Discharge Frequent or constant Supervision/Assistance  Patient can return home with the following A lot of help with bathing/dressing/bathroom;Assistance with cooking/housework;Assist for transportation;Help with stairs or ramp for entrance;A lot of help with walking and/or transfers   Equipment Recommendations  Rolling walker (2 wheels)    Recommendations for Other Services Rehab consult     Precautions / Restrictions Precautions Precautions: Fall Restrictions Weight Bearing Restrictions: No     Mobility  Bed Mobility               General bed mobility comments: pt up in chair upon PT arrival    Transfers Overall transfer level:  Needs assistance Equipment used: Rolling walker (2 wheels) Transfers: Sit to/from Stand, Bed to chair/wheelchair/BSC Sit to Stand: Min assist           General transfer comment: minA to power up, pt's L quad stays in extension until she manually pushed it down and then it locks into extension upon standing, verbal cues for safe hand placement    Ambulation/Gait Ambulation/Gait assistance: Mod assist, +2 safety/equipment (mod assist to help normalize L LE, +2 for chair follow) Gait Distance (Feet): 30 Feet Assistive device: Rolling walker (2 wheels) Gait Pattern/deviations: Step-to pattern, Decreased stride length (hemi paretic gait with Lt quad extensor tone,) Gait velocity: slow Gait velocity interpretation: <1.8 ft/sec, indicate of risk for recurrent falls   General Gait Details: L LE extensor tone, minimal foot clearance, PT provided manual assistance to try to flex L knee for improved ambulation kinematics. Pt heavily dependent on bialt UEs   Stairs             Wheelchair Mobility    Modified Rankin (Stroke Patients Only) Modified Rankin (Stroke Patients Only) Pre-Morbid Rankin Score: No symptoms Modified Rankin: Moderately severe disability     Balance Overall balance assessment: Needs assistance Sitting-balance support: Feet supported, No upper extremity supported Sitting balance-Leahy Scale: Fair     Standing balance support: During functional activity, No upper extremity supported Standing balance-Leahy Scale: Poor Standing balance comment: dependent on bilat UE                            Cognition Arousal/Alertness: Awake/alert Behavior During  Therapy: WFL for tasks assessed/performed Overall Cognitive Status: Within Functional Limits for tasks assessed                                 General Comments: pt aware of deficits and asked appropriate questions to PT. Pt followed all commands and was not impulsive.         Exercises General Exercises - Lower Extremity Long Arc Quad: AAROM, Left, 10 reps, Seated Heel Slides: AAROM, Left, 10 reps, Seated Heel Raises: AAROM, Left, 10 reps, Seated    General Comments General comments (skin integrity, edema, etc.): VSS      Pertinent Vitals/Pain Pain Assessment Pain Assessment: No/denies pain    Home Living                          Prior Function            PT Goals (current goals can now be found in the care plan section) Acute Rehab PT Goals PT Goal Formulation: With patient Time For Goal Achievement: 04/14/22 Potential to Achieve Goals: Good Progress towards PT goals: Progressing toward goals    Frequency    Min 4X/week      PT Plan Current plan remains appropriate    Co-evaluation              AM-PAC PT "6 Clicks" Mobility   Outcome Measure  Help needed turning from your back to your side while in a flat bed without using bedrails?: A [place] Help needed moving from lying on your back to sitting on the side of a flat bed without using bedrails?: A [place] Help needed moving to and from a bed to a chair (including a wheelchair)?: A [place] Help needed standing up from a chair using your arms (e.g., wheelchair or bedside chair)?: A [place] Help needed to walk in hospital room?: A Lot Help needed climbing 3-5 steps with a railing? : Total 6 Click Score: 15    End of Session Equipment Utilized During Treatment: Gait belt Activity Tolerance: Patient tolerated treatment well Patient left: with call bell/phone within Cook;with family/visitor present;in bed;with SCD's reapplied Nurse Communication: Mobility status PT Visit Diagnosis: Other abnormalities of gait and mobility (R26.89);Other symptoms and signs involving the nervous system (R29.898);Hemiplegia and hemiparesis Hemiplegia - Right/Left: Left Hemiplegia - dominant/non-dominant: Dominant ((Pt reports Rt handed this visit)) Hemiplegia - caused by: Cerebral  infarction     Time: 9798-9211 PT Time Calculation (min) (ACUTE ONLY): 30 min  Charges:  $Gait Training: 8-22 mins $Therapeutic Exercise: 8-22 mins                     Lewis Shock, PT, DPT Acute Rehabilitation Services Secure chat preferred Office #: [phone]    Iona Hansen 04/07/2022, 2:45 PM

## 2022-04-07 NOTE — TOC Progression Note (Signed)
Transition of Care [facility]) - Progression Note    Patient Details  Name: [name] MRN: [id number] Date of Birth: 1965-11-14  Transition of Care [facility]) CM/SW Contact  Baldemar Lenis, [place] Phone Number: 04/07/2022, 11:27 AM  Clinical Narrative:   CSW checked in with [facility] for whether authorization had been received, and auth is still pending at this time. CSW to follow.    Expected Discharge Plan: Skilled Nursing Facility Barriers to Discharge: Continued Medical Work up, English as a second language teacher  Expected Discharge Plan and Services Expected Discharge Plan: Skilled Nursing Facility In-house Referral: Artist Discharge Planning Services: CM Consult, [facility] Program   Living arrangements for the past 2 months: Apartment                                       Social Determinants of Health (SDOH) Interventions    Readmission Risk Interventions     View : No data to display.

## 2022-04-07 NOTE — Progress Notes (Signed)
Pain Progress Note  Patient: [name] [id number] DOB: Apr 06, 1966  DOA: 03/29/2022  DOS: 04/07/2022   56 year old female with past medical history of coronary artery disease, diabetes mellitus type 2, noncompliance with medication, GERD presented to hospital with hematemesis chest pain and GERD symptoms.  Patient was admitted to the hospital for evaluation of chest pain, ruled in for NSTEMI, underwent cardiac catheterization with stent placement and was cleared by cardiology for discharge.  Hospitalization was notable for hyperglycemia with hemoglobin A1c 12.3, started on insulin. Discharge was contemplated 5/28, patient developed sudden onset left-sided numbness and weakness, code stroke was called.  Patient was then transferred to the neuro ICU and TNK was administered.  Patient was subsequently transferred out of the ICU to the medical team.  At this time, patient has been awaiting for rehabilitation placement.  Assessment and Plan: Principal Problem:   NSTEMI (non-ST elevated myocardial infarction) (HCC) Active Problems:   Left sided numbness   CAD S/P percutaneous coronary angioplasty   Uncontrolled type 2 diabetes mellitus with hyperglycemia, without long-term current use of insulin (HCC)   Hematemesis   Cystic disease of liver   Essential hypertension, benign   Hyperlipidemia   Obesity (BMI 30-39.9)   Chest pain   Stroke (cerebrum) (HCC)   NSTEMI, CAD: Status post left heart catheterization with stent placement.  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  Stable at this time.  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  Thought to be secondary to recent catheterization/ cryptogenic in nature.  No cardioembolic source noted on 2D echocardiogram.  TEE done on 04/02/2022 showed LV ejection fraction of 60 to 65% with no atrial thrombus.   Bubble study was negative as well.  Plan for 30-day monitoring on discharge. Physical therapy has recommended CIR at this time.  Neurology has signed off at this time.  Recommendation is to continue Plavix and statin on discharge..  Still has left-sided weakness and numbness.  Uncontrolled type 2 diabetes mellitus with hyperglycemia, without long-term current use of insulin (HCC): Latest HbA1c 12.5%.  Continue basal and sliding scale insulin.  Patient would likely benefit from  semaglutide as outpatient  given concomitant obesity.    Cystic disease of liver Will need to follow-up with GI as outpatient.  Significance unknown at this time.   Hematemesis Resolved.  Continue PPI   Essential HTN:  Continue Coreg.  Lisinopril has been changed to losartan as per cardiology.  Blood pressure seems to be stable at this time.  Latest blood pressure of 118/54  Hyperlipidemia.  Latest LDL 173. Continue Crestor.  LDL goal less than 55.   Obesity: Body mass index is 33.28 kg/m. Could benefit from GLP agonist due to diabetes and obesity.   RUE edema: Negative venous and arterial U/S.  Mild ecchymosis improving.  Enlarged thyroid gland. Plan for thyroid U/S as outpatient.   Debility deconditioning.  At this time, patient has been considered for CIR. Medically stable for disposition   Subjective:   Today, patient was seen and examined at bedside.  Still complains of numbness on the left side of the body.  Still has left lower extremity weakness.  Denies chest pain, shortness of breath, diaphoresis  Objective: Vitals:   04/06/22 2027 04/06/22 2314 04/07/22 0344 04/07/22 0735  BP: 128/79 (!) 123/92 93/60 118/64  Pulse: 68 67 64 66  Resp: 17 18 17  20  Temp: 98.3 F (36.8 C) 98.4 F (36.9 C) 98 F (36.7 C) 98 F (36.7 C)  TempSrc: Oral Oral Oral Oral  SpO2: 99% 96% 95% 99%  Weight:      Height:       General: Obese built, not in obvious distress HENT:   No scleral pallor or icterus noted.  Oral mucosa is moist.  Chest:  Clear breath sounds.  Diminished breath sounds bilaterally. No crackles or wheezes.  CVS: S1 &S2 heard. No murmur.  Regular rate and rhythm. Abdomen: Soft, nontender, nondistended.  Bowel sounds are heard.   Extremities: No cyanosis, clubbing or edema.  Peripheral pulses are palpable.  Right forearm with some ecchymosis Psych: Alert, awake and oriented, normal mood CNS: Left-sided numbness of the body.  Left lower extremity weakness Skin: Warm and dry.  No rashes noted.   Data Personally reviewed:  CBC: Recent Labs  Lab 04/01/22 0347 04/02/22 0327 04/03/22 0329 04/04/22 0308 04/05/22 0055  WBC 7.6 7.7 6.5 7.3 6.6  HGB 11.9* 12.1 11.4* 10.6* 10.9*  HCT 34.2* 36.0 34.0* 30.9* 32.5*  MCV 94.0 95.2 96.0 94.5 96.2  PLT 185 204 211 208 211   Basic Metabolic Panel: Recent Labs  Lab 04/01/22 0347 04/02/22 0327 04/03/22 0329 04/04/22 0308 04/05/22 0055  NA 136 138 140 142 140  K 4.0 3.7 3.5 3.6 3.9  CL 104 107 111 113* 111  CO2 25 25 23 23 24   GLUCOSE 263* 154* 132* 95 180*  BUN 20 23* 21* 15 17  CREATININE 0.90 1.02* 0.78 0.79 0.92  CALCIUM 8.9 9.3 8.5* 8.5* 8.7*   GFR: Estimated Creatinine Clearance: 75.1 mL/min (by C-G formula based on SCr of 0.92 mg/dL). Liver Function Tests: No results for input(s): AST, ALT, ALKPHOS, BILITOT, PROT, ALBUMIN in the last 168 hours. No results for input(s): LIPASE, AMYLASE in the last 168 hours. No results for input(s): AMMONIA in the last 168 hours. Coagulation Profile: No results for input(s): INR, PROTIME in the last 168 hours. Cardiac Enzymes: No results for input(s): CKTOTAL, CKMB, CKMBINDEX, TROPONINI in the last 168 hours. BNP (last 3 results) No results for input(s): PROBNP in the last 8760 hours. HbA1C: No results for input(s): HGBA1C in the last 72 hours.  CBG: Recent Labs  Lab 04/06/22 0607 04/06/22 1204 04/06/22 1705 04/06/22 2135 04/07/22 0733  GLUCAP 134* 224* 149* 228* 149*    Lipid Profile: No results for input(s): CHOL, HDL, LDLCALC, TRIG, CHOLHDL, LDLDIRECT in the last 72 hours.  Thyroid Function Tests: No results for input(s): TSH, T4TOTAL, FREET4, T3FREE, THYROIDAB in the last 72 hours. Anemia Panel: No results for input(s): VITAMINB12, FOLATE, FERRITIN, TIBC, IRON, RETICCTPCT in the last 72 hours. Urine analysis:    Component Value Date/Time   COLORURINE YELLOW 11/15/2019 0426   APPEARANCEUR CLEAR 11/15/2019 0426   LABSPEC >=1.030 03/16/2021 1640   PHURINE 5.5 03/16/2021 1640   GLUCOSEU 100 (A) 03/16/2021 1640   HGBUR NEGATIVE 03/16/2021 1640   BILIRUBINUR NEGATIVE 03/16/2021 1640   KETONESUR NEGATIVE 03/16/2021 1640   PROTEINUR NEGATIVE 03/16/2021 1640   UROBILINOGEN 0.2 03/16/2021 1640   NITRITE NEGATIVE 03/16/2021 1640   LEUKOCYTESUR SMALL (A) 03/16/2021 1640   Recent Results (from the past 240 hour(s))  MRSA Next Gen by PCR, Nasal     Status: Abnormal   Collection Time: 03/29/22  4:59 PM   Specimen: Nasal Mucosa; Nasal Swab  Result Value Ref Range Status   MRSA by PCR Next Gen DETECTED (A) NOT DETECTED Final  Comment: RESULT CALLED TO, READ BACK BY AND VERIFIED WITH: S,KING RN @1920  03/29/22 E,BENTON (NOTE) The GeneXpert MRSA Assay (FDA approved for NASAL specimens only), is one component of a comprehensive MRSA colonization surveillance program. It is not intended to diagnose MRSA infection nor to guide or monitor treatment for MRSA infections. Test performance is not FDA approved in patients less than 58 years old. Performed at [facility], [address] [place], [place] [address]      No results found.  Family Communication: Spoke with the patient at bedside.  Disposition: Status is: Inpatient  Disposition.   Awaiting CIR disposition , will need 30-day event monitoring on discharge.  Medically stable for disposition.  Planned Discharge Destination: Rehab  Joycelyn DasLaxman Krishon Adkison, MD 04/07/2022 9:55 AM Page by [email or web address]

## 2022-04-07 NOTE — Plan of Care (Signed)
  Problem: Education: Goal: Knowledge of General Education information will improve Description: Including pain rating scale, medication(s)/side effects and non-pharmacologic comfort measures Outcome: Progressing   Problem: Health Behavior/Discharge Planning: Goal: Ability to manage health-related needs will improve Outcome: Progressing   Problem: Clinical Measurements: Goal: Ability to maintain clinical measurements within normal limits will improve Outcome: Progressing Goal: Will remain free from infection Outcome: Progressing Goal: Diagnostic test results will improve Outcome: Progressing Goal: Respiratory complications will improve Outcome: Progressing Goal: Cardiovascular complication will be avoided Outcome: Progressing   Problem: Education: Goal: Knowledge of disease or condition will improve Outcome: Progressing Goal: Knowledge of secondary prevention will improve (SELECT ALL) Outcome: Progressing Goal: Knowledge of patient specific risk factors will improve (INDIVIDUALIZE FOR PATIENT) Outcome: Progressing Goal: Individualized Educational Video(s) Outcome: Progressing

## 2022-04-07 NOTE — Progress Notes (Signed)
Occupational Therapy Treatment Patient Details Name: [name] MRN: [id number] DOB: [birth year] Today's Date: 04/07/2022   History of present illness 56 yo F admitted 5/27 as code STEMI, taken for LHC.  On 5/28 pt had L sided sensory loss. Code stroke activated.  Received tnk. After tnk had oral mucosal bleeding, HA, chest pain, R forearm pain and swelling at site of LHC access. MRI - Small scattered acute infarcts in the cerebral cortex and right thalamus. PMhx: polyneuropathy, obesity, HLD, CAD, DM, HTN   OT comments  Pt progressing towards goals, able to complete ADLs with min guard with use of RW this session. Pt requiring rest breaks x2 during standing ADL tasks, however remains motivated to mobilize and work with therapy. Pt continues to have LUE numbness, however ROM WFL and strength improving (4/5). Educated pt on techniques to improve sensation in LUE, and readministered squeeze ball to work on LUE strengthening. Pt presenting with impairments listed below, will follow acutely. Continue to recommend AIR at d/c.   Recommendations for follow up therapy are one component of a multi-disciplinary discharge planning process, led by the attending physician.  Recommendations may be updated based on patient status, additional functional criteria and insurance authorization.    Follow Up Recommendations  Acute inpatient rehab (3hours/day)    Assistance Recommended at Discharge Frequent or constant Supervision/Assistance  Patient can return home with the following  Assistance with cooking/housework;Help with stairs or ramp for entrance;Assist for transportation;Direct supervision/assist for financial management;Direct supervision/assist for medications management;A Cirelli help with walking and/or transfers;A lot of help with bathing/dressing/bathroom   Equipment Recommendations  None recommended by OT;Other (comment) (defer to next venue of care)    Recommendations for Other Services Rehab  consult    Precautions / Restrictions Precautions Precautions: Fall Precaution Comments: 5/27 radial heart cath Restrictions Weight Bearing Restrictions: No       Mobility Bed Mobility               General bed mobility comments: pt OOB in chair upon arrival    Transfers Overall transfer level: Needs assistance Equipment used: Rolling walker (2 wheels) Transfers: Sit to/from Stand, Bed to chair/wheelchair/BSC Sit to Stand: Min assist                 Balance Overall balance assessment: Needs assistance Sitting-balance support: Feet supported, No upper extremity supported Sitting balance-Leahy Scale: Fair     Standing balance support: During functional activity, No upper extremity supported Standing balance-Leahy Scale: Poor Standing balance comment: dependent on bilat UE                           ADL either performed or assessed with clinical judgement   ADL       Grooming: Min guard;Standing Grooming Details (indicate cue type and reason): oral care and brushing hair completed in standing                 Toilet Transfer: Min guard;Ambulation;Regular Toilet;Rolling walker (2 wheels) Toilet Transfer Details (indicate cue type and reason): simulated in room         Functional mobility during ADLs: Min guard;Rolling walker (2 wheels)      Extremity/Trunk Assessment Upper Extremity Assessment Upper Extremity Assessment: LUE deficits/detail LUE Deficits / Details: shoulder, elbow, wrist,and digit ROM WFL, strength improving LUE Sensation: decreased light touch LUE Coordination: decreased fine motor   Lower Extremity Assessment Lower Extremity Assessment: Defer to PT evaluation  Vision   Vision Assessment?: No apparent visual deficits   Perception Perception Perception: Not tested   Praxis Praxis Praxis: Not tested    Cognition Arousal/Alertness: Awake/alert Behavior During Therapy: WFL for tasks  assessed/performed, Flat affect Overall Cognitive Status: Within Functional Limits for tasks assessed Area of Impairment: Following commands, Safety/judgement, Awareness, Problem solving                       Following Commands: Follows one step commands consistently, Follows one step commands with increased time   Awareness: Anticipatory Problem Solving: Requires verbal cues          Exercises      Shoulder Instructions       General Comments VSS on RA    Pertinent Vitals/ Pain       Pain Assessment Pain Assessment: No/denies pain  Home Living                                          Prior Functioning/Environment              Frequency  Min 3X/week        Progress Toward Goals  OT Goals(current goals can now be found in the care plan section)  Progress towards OT goals: Progressing toward goals  Acute Rehab OT Goals Patient Stated Goal: to get to rehab so she can get home OT Goal Formulation: With patient Time For Goal Achievement: 04/14/22 Potential to Achieve Goals: Good ADL Goals Pt Will Perform Eating: with modified independence;sitting Pt Will Perform Grooming: with modified independence;sitting Pt Will Perform Upper Body Bathing: with modified independence;sitting Pt Will Perform Lower Body Bathing: with supervision;sit to/from stand Pt Will Perform Upper Body Dressing: with modified independence;sitting Pt Will Perform Lower Body Dressing: with supervision;sit to/from stand Pt Will Transfer to Toilet: with supervision;ambulating;bedside commode Pt Will Perform Toileting - Clothing Manipulation and hygiene: with supervision;sit to/from stand Pt/caregiver will Perform Home Exercise Program: Increased ROM;Left upper extremity;With written HEP provided;Independently Additional ADL Goal #1: Pt will be Mod I in and OOB (HOB flat, no rail, increased time)  Plan Discharge plan remains appropriate;Frequency remains appropriate     Co-evaluation                 AM-PAC OT "6 Clicks" Daily Activity     Outcome Measure   Help from another person eating meals?: None Help from another person taking care of personal grooming?: None Help from another person toileting, which includes using toliet, bedpan, or urinal?: A [place] Help from another person bathing (including washing, rinsing, drying)?: A Lot Help from another person to put on and taking off regular upper body clothing?: A [place] Help from another person to put on and taking off regular lower body clothing?: A Lot 6 Click Score: 18    End of Session Equipment Utilized During Treatment: Gait belt;Rolling walker (2 wheels)  OT Visit Diagnosis: Other abnormalities of gait and mobility (R26.89);Muscle weakness (generalized) (M62.81);Other symptoms and signs involving cognitive function;Hemiplegia and hemiparesis;Pain Hemiplegia - Right/Left: Left Hemiplegia - dominant/non-dominant: Non-Dominant Hemiplegia - caused by: Cerebral infarction   Activity Tolerance Patient tolerated treatment well   Patient Left in chair;with call bell/phone within reach;with chair alarm set   Nurse Communication Mobility status        Time: [id number] OT Time Calculation (min): 27 min  Charges: OT General Charges $OT  Visit: 1 Visit OT Treatments $Self Care/Home Management : 23-37 mins  Lynnda Child, OTD, OTR/L Acute Rehab [phone] - [place] 04/07/2022, 4:06 PM

## 2022-04-08 LAB — GLUCOSE, CAPILLARY
Glucose-Capillary: 171 mg/dL — ABNORMAL HIGH (ref 70–99)
Glucose-Capillary: 175 mg/dL — ABNORMAL HIGH (ref 70–99)
Glucose-Capillary: 177 mg/dL — ABNORMAL HIGH (ref 70–99)

## 2022-04-08 MED ORDER — ALBUTEROL SULFATE HFA 108 (90 BASE) MCG/ACT IN AERS: 2.0000 | INHALATION_SPRAY | Freq: Four times a day (QID) | RESPIRATORY_TRACT | Status: DC | PRN

## 2022-04-08 MED ORDER — EMPAGLIFLOZIN 10 MG PO TABS: 10.0000 mg | ORAL_TABLET | Freq: Every day | ORAL | Status: DC

## 2022-04-08 MED ORDER — ISOSORBIDE MONONITRATE ER 30 MG PO TB24: 30.0000 mg | ORAL_TABLET | Freq: Every day | ORAL | Status: DC

## 2022-04-08 MED ORDER — ROSUVASTATIN CALCIUM 40 MG PO TABS: 40.0000 mg | ORAL_TABLET | Freq: Every day | ORAL | Status: DC

## 2022-04-08 MED ORDER — INSULIN GLARGINE-YFGN 100 UNIT/ML ~~LOC~~ SOLN: 14.0000 [IU] | Freq: Two times a day (BID) | SUBCUTANEOUS | Status: DC

## 2022-04-08 MED ORDER — SENNOSIDES-DOCUSATE SODIUM 8.6-50 MG PO TABS: 1.0000 | ORAL_TABLET | Freq: Every evening | ORAL | Status: DC | PRN

## 2022-04-08 MED ORDER — LOSARTAN POTASSIUM 25 MG PO TABS: 12.5000 mg | ORAL_TABLET | Freq: Every day | ORAL | Status: DC

## 2022-04-08 MED ORDER — INSULIN ASPART 100 UNIT/ML IJ SOLN: 0.0000 [IU] | Freq: Three times a day (TID) | INTRAMUSCULAR | Status: DC

## 2022-04-08 MED ORDER — PANTOPRAZOLE SODIUM 40 MG PO TBEC: 40.0000 mg | DELAYED_RELEASE_TABLET | Freq: Every day | ORAL | Status: DC

## 2022-04-08 NOTE — Progress Notes (Addendum)
Pain Progress Note  Patient: [name] [id number] DOB: [birth year]  DOA: 03/29/2022  DOS: 04/08/2022   56 year old female with past medical history of coronary artery disease, diabetes mellitus type 2, noncompliance with medication, GERD presented to hospital with hematemesis chest pain and GERD symptoms.  Patient was admitted to the hospital for evaluation of chest pain, ruled in for NSTEMI, underwent cardiac catheterization with stent placement and was cleared by cardiology for discharge.  Hospitalization was notable for hyperglycemia with hemoglobin A1c 12.3, started on insulin. Discharge was contemplated 5/28, patient developed sudden onset left-sided numbness and weakness, code stroke was called.  Patient was then transferred to the neuro ICU and TNK was administered.  Patient was subsequently transferred out of the ICU to the medical team.  At this time, patient has remained stable though she complains of left-sided numbness and left lower extremity weakness.  Medically stable for disposition.  Has been awaiting for rehabilitation placement.  Assessment and Plan: Principal Problem:   NSTEMI (non-ST elevated myocardial infarction) ([facility]) Active Problems:   Left sided numbness   CAD S/P percutaneous coronary angioplasty   Uncontrolled type 2 diabetes mellitus with hyperglycemia, without long-term current use of insulin (HCC)   Cystic disease of liver   Essential hypertension, benign   Hyperlipidemia   Obesity (BMI 30-39.9)   Stroke (cerebrum) (HCC)   NSTEMI, CAD: Status post left heart catheterization with stent placement.  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  Stable at this time.  No further chest pain during hospitalization.  Acute bilateral CVAs, predominantly right sided, consistent with embolic source.  Thought to be secondary to recent catheterization/ cryptogenic in nature.  No cardioembolic source noted on 2D  echocardiogram.  TEE done on 04/02/2022 showed LV ejection fraction of 60 to 65% with no atrial thrombus.  Bubble study was negative as well.  Plan for 30-day monitoring on discharge. Physical therapy has recommended CIR at this time.  Neurology has signed off at this time.  Recommendation is to continue Plavix and statin on discharge..  Still has left-sided weakness and numbness.  Uncontrolled type 2 diabetes mellitus with hyperglycemia, without long-term current use of insulin ([name]):  Latest HbA1c 12.5%.  Continue basal and sliding scale insulin.  Patient would likely benefit from  semaglutide as outpatient  given concomitant obesity.    Cystic disease of liver Will need to follow-up with GI as outpatient.  Significance unknown at this time.   Hematemesis Resolved.  Continue PPI   Essential HTN:  Continue Coreg.  Lisinopril has been changed to losartan as per cardiology.  Blood pressure seems to be stable at this time.  Latest blood pressure of 120/74.  Hyperlipidemia.  Latest LDL 173. Continue Crestor.  LDL goal less than 55.   Obesity: Body mass index is 33.28 kg/m. Could benefit from GLP agonist due to diabetes and obesity.   RUE edema: Negative venous and arterial U/S.  Mild ecchymosis improving.  Enlarged thyroid gland. Plan for thyroid U/S as outpatient.   Debility deconditioning.  At this time, patient has been considered for CIR. Medically stable for disposition   Subjective:   Today, patient was seen and examined at bedside.  Still complains of mild numbness over the left side of the body.  Has left lower extremity weakness.  Chest pain, shortness of breath, diaphoresis, dizziness.  Inquiring about discharge.  Objective: Vitals:   04/07/22 2306 04/08/22 0326 04/08/22 0900 04/08/22 1109  BP: Marland Kitchen)  141/85 112/61 120/60 120/74  Pulse: 76 61 60 63  Resp: 16 16 16    Temp: 98.1 F (36.7 C) 98.3 F (36.8 C) 98.2 F (36.8 C) 98.4 F (36.9 C)  TempSrc: Oral Oral Tympanic Oral   SpO2: 98% 95%  99%  Weight:      Height:       General: Obese built, not in obvious distress HENT:   No scleral pallor or icterus noted. Oral mucosa is moist.  Chest:  Clear breath sounds.  Diminished breath sounds bilaterally. No crackles or wheezes.  CVS: S1 &S2 heard. No murmur.  Regular rate and rhythm. Abdomen: Soft, nontender, nondistended.  Bowel sounds are heard.   Extremities: No cyanosis, clubbing or edema.  Peripheral pulses are palpable.  Right forearm with some ecchymosis. Psych: Alert, awake and oriented, normal mood CNS: Left-sided numbness of the body.  Left lower extremity weakness noted. Skin: Warm and dry.  No rashes noted.  Data Personally reviewed:  CBC: Recent Labs  Lab 04/02/22 0327 04/03/22 0329 04/04/22 0308 04/05/22 0055  WBC 7.7 6.5 7.3 6.6  HGB 12.1 11.4* 10.6* 10.9*  HCT 36.0 34.0* 30.9* 32.5*  MCV 95.2 96.0 94.5 96.2  PLT 204 211 208 [id number]   Basic Metabolic Panel: Recent Labs  Lab 04/02/22 0327 04/03/22 0329 04/04/22 0308 04/05/22 0055  NA 138 140 142 140  K 3.7 3.5 3.6 3.9  CL 107 111 113* 111  CO2 25 23 23 24   GLUCOSE 154* 132* 95 180*  BUN 23* 21* 15 17  CREATININE 1.02* 0.78 0.79 0.92  CALCIUM 9.3 8.5* 8.5* 8.7*   GFR: Estimated Creatinine Clearance: 75.1 mL/min (by C-G formula based on SCr of 0.92 mg/dL). Liver Function Tests: No results for input(s): AST, ALT, ALKPHOS, BILITOT, PROT, ALBUMIN in the last 168 hours. No results for input(s): LIPASE, AMYLASE in the last 168 hours. No results for input(s): AMMONIA in the last 168 hours. Coagulation Profile: No results for input(s): INR, PROTIME in the last 168 hours. Cardiac Enzymes: No results for input(s): CKTOTAL, CKMB, CKMBINDEX, TROPONINI in the last 168 hours. BNP (last 3 results) No results for input(s): PROBNP in the last 8760 hours. HbA1C: No results for input(s): HGBA1C in the last 72 hours.  CBG: Recent Labs  Lab 04/07/22 1629 04/07/22 2149 04/08/22 0616  04/08/22 0630 04/08/22 1111  GLUCAP 235* 176* 171* 175* 177*   Lipid Profile: No results for input(s): CHOL, HDL, LDLCALC, TRIG, CHOLHDL, LDLDIRECT in the last 72 hours.  Thyroid Function Tests: No results for input(s): TSH, T4TOTAL, FREET4, T3FREE, THYROIDAB in the last 72 hours. Anemia Panel: No results for input(s): VITAMINB12, FOLATE, FERRITIN, TIBC, IRON, RETICCTPCT in the last 72 hours. Urine analysis:    Component Value Date/Time   COLORURINE YELLOW 11/15/2019 0426   APPEARANCEUR CLEAR 11/15/2019 0426   LABSPEC >=1.030 03/16/2021 1640   PHURINE 5.5 03/16/2021 1640   GLUCOSEU 100 (A) 03/16/2021 1640   HGBUR NEGATIVE 03/16/2021 1640   BILIRUBINUR NEGATIVE 03/16/2021 1640   KETONESUR NEGATIVE 03/16/2021 1640   PROTEINUR NEGATIVE 03/16/2021 1640   UROBILINOGEN 0.2 03/16/2021 1640   NITRITE NEGATIVE 03/16/2021 1640   LEUKOCYTESUR SMALL (A) 03/16/2021 1640   Recent Results (from the past 240 hour(s))  MRSA Next Gen by PCR, Nasal     Status: Abnormal   Collection Time: 03/29/22  4:59 PM   Specimen: Nasal Mucosa; Nasal Swab  Result Value Ref Range Status   MRSA by PCR Next Gen DETECTED (A) NOT DETECTED Final  Comment: RESULT CALLED TO, READ BACK BY AND VERIFIED WITH: S,KING RN @1920  03/29/22 E,[place] (NOTE) The GeneXpert MRSA Assay (FDA approved for NASAL specimens only), is one component of a comprehensive MRSA colonization surveillance program. It is not intended to diagnose MRSA infection nor to guide or monitor treatment for MRSA infections. Test performance is not FDA approved in patients less than 80 years old. Performed at [facility], [place] [address] [place], [place] [address]      No results found.  Family Communication: Spoke with the patient at bedside.  Disposition: Status is: Inpatient  Disposition.   Awaiting CIR disposition , will need 30-day event monitoring on discharge.  Medically stable for disposition.  Planned Discharge Destination:  Rehab  Flora Lipps, MD 04/08/2022 1:56 PM Page by [email or web address]

## 2022-04-08 NOTE — Progress Notes (Signed)
Physical Therapy Treatment Patient Details Name: [name] MRN: [id number] DOB: 1965-11-29 Today's Date: 04/08/2022   History of Present Illness 56 yo F admitted 5/27 as code STEMI, taken for LHC.  On 5/28 pt had L sided sensory loss. Code stroke activated.  Received tnk. After tnk had oral mucosal bleeding, HA, chest pain, R forearm pain and swelling at site of LHC access. MRI - Small scattered acute infarcts in the cerebral cortex and right thalamus. PMhx: polyneuropathy, obesity, HLD, CAD, DM, HTN    PT Comments    Pt with continued L LE extensor tone however demonstrated increased active movement in form of LAQ, heels slides, and heel raises. Pt assisted to [facility] and demonstrated improved standing balance as pt able to stand with L UE support on walker only while performing pericare. Pt with increased ambulation tolerance however continues to require assist from PT to manual stimulate L knee bending to optimize stepping pattern as pt continues to have L LE locked in extension limiting L foot clearance. Pt to cont to benefit from AIR upon d/c for maximal functional recovery as pt was indep PTA. Acute PT to cont to follow.   Recommendations for follow up therapy are one component of a multi-disciplinary discharge planning process, led by the attending physician.  Recommendations may be updated based on patient status, additional functional criteria and insurance authorization.  Follow Up Recommendations  Acute inpatient rehab (3hours/day)     Assistance Recommended at Discharge Frequent or constant Supervision/Assistance  Patient can return home with the following A lot of help with bathing/dressing/bathroom;Assistance with cooking/housework;Assist for transportation;Help with stairs or ramp for entrance;A lot of help with walking and/or transfers   Equipment Recommendations  Rolling walker (2 wheels)    Recommendations for Other Services Rehab consult     Precautions / Restrictions  Precautions Precautions: Fall Precaution Comments: 5/27 radial heart cath Restrictions Weight Bearing Restrictions: No     Mobility  Bed Mobility               General bed mobility comments: pt OOB in chair upon arrival    Transfers Overall transfer level: Needs assistance Equipment used: Rolling walker (2 wheels) Transfers: Sit to/from Stand, Bed to chair/wheelchair/BSC Sit to Stand: Min assist   Step pivot transfers: Min assist       General transfer comment: minA to steady pt during transition of hands from arm rest to walker, minA for walker management during step pvt transfer to [facility]    Ambulation/Gait Ambulation/Gait assistance: Mod assist, +2 safety/equipment (mod assist to help normalize L LE, +2 for chair follow) Gait Distance (Feet): 60 Feet Assistive device: Rolling walker (2 wheels) Gait Pattern/deviations: Step-to pattern, Decreased stride length (hemi paretic gait with Lt quad extensor tone,) Gait velocity: slow Gait velocity interpretation: <1.8 ft/sec, indicate of risk for recurrent falls   General Gait Details: L LE extensor tone, minimal foot clearance, PT provided manual assistance to try to flex L knee for improved ambulation kinematics. Pt heavily dependent on bialt UEs   Stairs             Wheelchair Mobility    Modified Rankin (Stroke Patients Only) Modified Rankin (Stroke Patients Only) Pre-Morbid Rankin Score: No symptoms Modified Rankin: Moderately severe disability     Balance Overall balance assessment: Needs assistance Sitting-balance support: Feet supported, No upper extremity supported Sitting balance-Leahy Scale: Fair     Standing balance support: During functional activity, No upper extremity supported Standing balance-Leahy Scale: Fair Standing  balance comment: pt able to perform pericare in standing with R UE while supporting self with L UE on walker with min guard for safety                             Cognition Arousal/Alertness: Awake/alert Behavior During Therapy: WFL for tasks assessed/performed, Flat affect Overall Cognitive Status: Within Functional Limits for tasks assessed                                 General Comments: pt with good carry over of safety with transfers from yesterday, good command follow. great effort, emotional re: ambulation difficulty        Exercises General Exercises - Lower Extremity Long Arc Quad: AAROM, Left, 10 reps, Seated Heel Slides: AAROM, Left, 10 reps, Seated (on towel) Toe Raises: AAROM, Both, 10 reps, Seated (AA with L LE, trace active movement in toes) Heel Raises: AAROM, Left, 10 reps, Seated    General Comments General comments (skin integrity, edema, etc.): VSS on RA      Pertinent Vitals/Pain Pain Assessment Pain Assessment: No/denies pain    Home Living                          Prior Function            PT Goals (current goals can now be found in the care plan section) Acute Rehab PT Goals Patient Stated Goal: go home PT Goal Formulation: With patient Time For Goal Achievement: 04/14/22 Potential to Achieve Goals: Good Progress towards PT goals: Progressing toward goals    Frequency    Min 4X/week      PT Plan Current plan remains appropriate    Co-evaluation              AM-PAC PT "6 Clicks" Mobility   Outcome Measure  Help needed turning from your back to your side while in a flat bed without using bedrails?: A [place]spurger Help needed moving from lying on your back to sitting on the side of a flat bed without using bedrails?: A [place]son Help needed moving to and from a bed to a chair (including a wheelchair)?: A Brines Help needed standing up from a chair using your arms (e.g., wheelchair or bedside chair)?: A Do[place]pike Help needed to walk in hospital room?: A Lot Help needed climbing 3-5 steps with a railing? : Total 6 Click Score: 15    End of Session Equipment Utilized During  Treatment: Gait belt Activity Tolerance: Patient tolerated treatment well Patient left: with call bell/phone within reach;with family/visitor present;in bed;with chair alarm set Nurse Communication: Mobility status PT Visit Diagnosis: Other abnormalities of gait and mobility (R26.89);Other symptoms and signs involving the nervous system (R29.898);Hemiplegia and hemiparesis Hemiplegia - Right/Left: Left Hemiplegia - dominant/non-dominant: Dominant ((Pt reports Rt handed this visit)) Hemiplegia - caused by: Cerebral infarction     Time: 9798-9211 PT Time Calculation (min) (ACUTE ONLY): 23 min  Charges:  $Gait Training: 8-22 mins $Therapeutic Exercise: 8-22 mins                     Lewis Shock, PT, DPT Acute Rehabilitation Services Secure chat preferred Office #: [phone]    Iona Hansen 04/08/2022, 2:01 PM

## 2022-04-08 NOTE — TOC Transition Note (Signed)
Transition of Care [facility]) - CM/SW Discharge Note   Patient Details  Name: [name] MRN: [id number] Date of Birth: [birth year]  Transition of Care [facility]) CM/SW Contact:  Baldemar Lenis, LCSW Phone Number: 04/08/2022, 2:53 PM   Clinical Narrative:   CSW confirmed with [facility] that insurance had been received and they can admit today. CSW updated MD, sent discharge information over to [facility]. Transport arranged with [facility] for next available.  Nurse to call report to [phone].    Final next level of care: IP Rehab Facility Barriers to Discharge: Barriers Resolved   Patient Goals and CMS Choice Patient states their goals for this hospitalization and ongoing recovery are:: return home      Discharge Placement              Patient chooses bed at:  [facility]) Patient to be transferred to facility by: [facility] Name of family member notified: Self Patient and family notified of of transfer: 04/08/22  Discharge Plan and Services In-house Referral: Artist Discharge Planning Services: CM Consult, [facility] Program                                 Social Determinants of Health (SDOH) Interventions     Readmission Risk Interventions     View : No data to display.

## 2022-04-08 NOTE — Progress Notes (Signed)
Speech Language Pathology Treatment: Cognitive-Linquistic  Patient Details Name: [name] MRN: [id number] DOB: 1965/12/07 Today's Date: 04/08/2022 Time: 1034-1050 SLP Time Calculation (min) (ACUTE ONLY): 16 min  Assessment / Plan / Recommendation Clinical Impression  Pt says her speech has been "coming and going" but that she feels like her recall and processing remain about the same. However, when challenging her to recall specific details from daily events, she needs minimal cueing. SLP provided education for memory and also discussed ways to simply environment, including reducing distractions, as pt was trying to juggle multiple phone calls. Pt became tearful and frustrated about feeling dependent on others. Emotional support was provided and offered to call a chaplain as well, although she declined at this time. Will continue to follow, recommending AIR upon discharge.    HPI HPI: Pt is a 56 yo female presenting initially with CP and found to have NSTEMI, who then developed L sided weakness and numbness. She did receive TNK. MRI revealed small, scattered infarcts in the R cerebral cortex and thalambus. PMH includes: DM, CAD, NSTEMI, recent PCI      SLP Plan  Continue with current plan of care      Recommendations for follow up therapy are one component of a multi-disciplinary discharge planning process, led by the attending physician.  Recommendations may be updated based on patient status, additional functional criteria and insurance authorization.    Recommendations                   Follow Up Recommendations: Acute inpatient rehab (3hours/day) Assistance recommended at discharge: Intermittent Supervision/Assistance SLP Visit Diagnosis: Cognitive communication deficit (R41.841);Dysarthria and anarthria (R47.1) Plan: Continue with current plan of care           Osie Bond., M.A. [place] Office ([phone]  Secure chat  preferred   04/08/2022, 12:08 PM

## 2022-04-08 NOTE — Discharge Summary (Signed)
Physician Discharge Summary   Patient: [name] MRN: [id number] DOB: 06-Jul-1966  Admit date:     03/29/2022  Discharge date: 04/08/22  Discharge Physician: Flora Lipps   PCP: Charlott Rakes, MD   Recommendations at discharge:   Follow-up with cardiology and neurology as outpatient.  Office to schedule an appointment. Patient has been started on insulin regimen during hospitalization for diabetes and might benefit from GLP agonist after discharge due to concomitant obesity Neurology has recommended 30-day event monitoring on discharge for stroke.  Recommend outpatient follow-up for cystic liver disease and thyroid ultrasound for enlarged thyroid.  Discharge Diagnoses: Principal Problem:   NSTEMI (non-ST elevated myocardial infarction) ([place]) Active Problems:   Left sided numbness   CAD S/P percutaneous coronary angioplasty   Uncontrolled type 2 diabetes mellitus with hyperglycemia, without long-term current use of insulin (HCC)   Cystic disease of liver   Essential hypertension, benign   Hyperlipidemia   Obesity (BMI 30-39.9)   Stroke (cerebrum) [facility])  Resolved Problems:   Hematemesis   Chest pain  Hospital Course: 56 year old female with past medical history of coronary artery disease, diabetes mellitus type 2, noncompliance with medication, GERD presented to hospital with hematemesis chest pain and GERD symptoms.  Patient was admitted to the hospital for evaluation of chest pain, ruled in for NSTEMI, underwent cardiac catheterization with stent placement and was cleared by cardiology for discharge.  Hospitalization was notable for hyperglycemia with hemoglobin A1c 12.3, started on insulin. Discharge was contemplated 5/28, patient developed sudden onset left-sided numbness and weakness, code stroke was called.  Patient was then transferred to the neuro ICU and TNK was administered.  Patient was subsequently transferred out of the ICU to the medical team.  At this time,  patient has remained stable though she complains of left-sided numbness and left lower extremity weakness.  Medically stable for disposition.  Has been awaiting for rehabilitation placement.  Assessment and Plan: Principal Problem:   NSTEMI (non-ST elevated myocardial infarction) ([facility]) Active Problems:   Left sided numbness   CAD S/P percutaneous coronary angioplasty   Uncontrolled type 2 diabetes mellitus with hyperglycemia, without long-term current use of insulin (HCC)   Cystic disease of liver   Essential hypertension, benign   Hyperlipidemia   Obesity (BMI 30-39.9)   Stroke (cerebrum) (HCC)   NSTEMI, CAD: Status post left heart catheterization with stent placement.  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  Stable at this time.  No further chest pain during hospitalization.  Acute bilateral CVAs, predominantly right sided, consistent with embolic source.  Thought to be secondary to recent catheterization/ cryptogenic in nature.  No cardioembolic source noted on 2D echocardiogram.  TEE done on 04/02/2022 showed LV ejection fraction of 60 to 65% with no atrial thrombus.  Bubble study was negative as well.  Plan for 30-day monitoring on discharge. Physical therapy has recommended CIR at this time.  Neurology has signed off at this time.  Recommendation is to continue Plavix and statin on discharge..  Still has left-sided weakness and numbness.  Uncontrolled type 2 diabetes mellitus with hyperglycemia, without long-term current use of insulin ([place]):  Latest HbA1c 12.5%.  Continue basal and sliding scale insulin.  Patient would likely benefit from  semaglutide as outpatient  given concomitant obesity.    Cystic disease of liver Will need to follow-up with GI as outpatient.  Significance unknown at this time.   Hematemesis Resolved.  Continue PPI  Essential HTN:  Continue Coreg.  Lisinopril has been changed to losartan as per  cardiology.  Blood pressure seems to be stable at this time.  Latest blood pressure of 120/74.  Hyperlipidemia.  Latest LDL 173. Continue Crestor.  LDL goal less than 55.   Obesity: Body mass index is 33.28 kg/m. Could benefit from GLP agonist due to diabetes and obesity.   RUE edema: Negative venous and arterial U/S.  Mild ecchymosis improving.  Enlarged thyroid gland. Plan for thyroid U/S as outpatient.   Debility deconditioning.  At this time, patient has been considered for CIR. Medically stable for disposition  Consultants: Neurology  Procedures performed:  Cardiac catheterization with stent placement. Thrombolytic treatment.  Disposition: Rehabilitation facility  Diet recommendation:  Discharge Diet Orders (From admission, onward)     Start     Ordered   04/08/22 0000  Diet - low sodium heart healthy        04/08/22 1435   04/08/22 0000  Diet Carb Modified        04/08/22 1435           Cardiac and Carb modified diet DISCHARGE MEDICATION: Allergies as of 04/08/2022       Reactions   Aspirin Anaphylaxis, Swelling   Tongue swells   Tramadol Hives   Hydrocodone Rash   Ibuprofen Itching   Morphine And Related Itching   Orange Fruit [citrus] Rash        Medication List     STOP taking these medications    glipiZIDE 10 MG tablet Commonly known as: GLUCOTROL   lisinopril-hydrochlorothiazide 20-25 MG tablet Commonly known as: ZESTORETIC       TAKE these medications    acetaminophen 500 MG tablet Commonly known as: TYLENOL Take 1 tablet (500 mg total) by mouth every 6 (six) hours as needed. What changed: reasons to take this   albuterol 108 (90 Base) MCG/ACT inhaler Commonly known as: VENTOLIN HFA Inhale 2 puffs into the lungs every 6 (six) hours as needed for wheezing or shortness of breath.   carvedilol 25 MG tablet Commonly known as: COREG TAKE 1 TABLET (25 MG TOTAL) BY MOUTH 2 (TWO) TIMES DAILY. What changed: how much to take    clopidogrel 75 MG tablet Commonly known as: PLAVIX Take 1 tablet (75 mg total) by mouth daily.   empagliflozin 10 MG Tabs tablet Commonly known as: JARDIANCE Take 1 tablet (10 mg total) by mouth daily. Start taking on: April 09, 2022   famotidine 20 MG tablet Commonly known as: PEPCID Take 1 tablet (20 mg total) by mouth 2 (two) times daily.   insulin aspart 100 UNIT/ML injection Commonly known as: novoLOG Inject 0-20 Units into the skin 3 (three) times daily with meals.   insulin glargine-yfgn 100 UNIT/ML injection Commonly known as: SEMGLEE Inject 0.14 mLs (14 Units total) into the skin 2 (two) times daily.   ipratropium 0.06 % nasal spray Commonly known as: ATROVENT Place 2 sprays into both nostrils 4 (four) times daily as needed for rhinitis.   isosorbide mononitrate 30 MG 24 hr tablet Commonly known as: IMDUR Take 1 tablet (30 mg total) by mouth at bedtime.   losartan 25 MG tablet Commonly known as: COZAAR Take 0.5 tablets (12.5 mg total) by mouth at bedtime.   nitroGLYCERIN 0.4 MG SL tablet Commonly known as: NITROSTAT PLACE 1 TABLET (0.4 MG TOTAL) UNDER THE TONGUE EVERY 5 (FIVE) MINUTES AS NEEDED FOR CHEST PAIN. What changed: how much to take   pantoprazole  40 MG tablet Commonly known as: PROTONIX Take 1 tablet (40 mg total) by mouth at bedtime.   rosuvastatin 40 MG tablet Commonly known as: CRESTOR Take 1 tablet (40 mg total) by mouth daily. Start taking on: April 09, 2022   senna-docusate 8.6-50 MG tablet Commonly known as: Senokot-S Take 1 tablet by mouth at bedtime as needed for mild constipation or moderate constipation.   True Metrix Meter w/Device Kit Check blood sugar BID.        Follow-up Information     Donato Heinz, MD Follow up.   Specialties: Cardiology, Radiology Why: call the office Monday afternoon for appt date and time with Dr. Gardiner Rhyme or PA/NP that works with him Contact information: [address]  [place] [address] [phone]         Donato Heinz, MD Follow up.   Specialties: Cardiology, Radiology Why: Appointment has been updated to an earlier appointment for patient. S/W Dominique  Appt.w/Dr. Dorna Mai @ [facility] Date: April 03, 2022 Time: 3:20pm Contact information: [address] [place] [address] [phone]         Dorna Mai, MD Follow up.   Specialty: Family Medicine Why: Follow up Appointment has been updated for earlier appointment for patient. S/W Oley Balm Date: April 03, 2022 - Thursday Time: 3:20pm AMM Contact information: [address] [place] [address] [phone]         [facility]. Schedule an appointment as soon as possible for a visit in 1 month(s).   Specialty: Neurology Why: stroke clinic Contact information: [address] [place] [phone]               Subjective:    Today, patient was seen and examined at bedside.  Still complains of mild numbness over the left side of the body.  Has left lower extremity weakness.  Denies chest pain, shortness of breath, diaphoresis, dizziness.  Inquiring about discharge.  Discharge Exam: Filed Weights   03/29/22 0600 04/02/22 1014  Weight: 90.7 kg 90.7 kg      04/08/2022   11:09 AM 04/08/2022    9:00 AM 04/08/2022    3:26 AM  Vitals with BMI  Systolic [phone]  Diastolic 74 60 61  Pulse 63 60 61    General: Obese built, not in obvious distress HENT:   No scleral pallor or icterus noted. Oral mucosa is moist.  Chest:  Clear breath sounds.  Diminished breath sounds bilaterally. No crackles or wheezes.  CVS: S1 &S2 heard. No murmur.  Regular rate and rhythm. Abdomen: Soft, nontender, nondistended.  Bowel sounds are heard.   Extremities: No cyanosis, clubbing or edema.  Peripheral pulses are palpable.  Right forearm with some ecchymosis. Psych:  Alert, awake and oriented, normal mood CNS: Left-sided numbness of the body.  Left lower extremity weakness noted. Skin: Warm and dry.  No rashes noted.   Condition at discharge: good  The results of significant diagnostics from this hospitalization (including imaging, microbiology, ancillary and laboratory) are listed below for reference.   Imaging Studies: DG Chest 2 View  Result Date: 03/23/2022 CLINICAL DATA:  Pleuritic chest pain. EXAM: CHEST - 2 VIEW COMPARISON:  Chest x-ray 03/17/2021 FINDINGS: The heart size and mediastinal contours are within normal limits. Both lungs are clear. The visualized skeletal structures are unremarkable. IMPRESSION: No active cardiopulmonary disease. Electronically Signed   By: Tina Griffiths.D.  On: 03/23/2022 15:30   CT HEAD WO CONTRAST (5MM)  Result Date: 03/31/2022 CLINICAL DATA:  Stroke.  Follow-up headache after T NK EXAM: CT HEAD WITHOUT CONTRAST TECHNIQUE: Contiguous axial images were obtained from the base of the skull through the vertex without intravenous contrast. RADIATION DOSE REDUCTION: This exam was performed according to the departmental dose-optimization program which includes automated exposure control, adjustment of the mA and/or kV according to patient size and/or use of iterative reconstruction technique. COMPARISON:  Yesterday FINDINGS: Brain: No evidence of acute infarction, hemorrhage, hydrocephalus, extra-axial collection or mass lesion/mass effect. Vascular: No hyperdense vessel or unexpected calcification. Skull: Normal. Negative for fracture or focal lesion. Sinuses/Orbits: No acute finding. IMPRESSION: Negative head CT. Electronically Signed   By: Jorje Guild M.D.   On: 03/31/2022 03:55   CT HEAD WO CONTRAST (5MM)  Result Date: 03/30/2022 CLINICAL DATA:  Neuro deficit, acute, stroke suspected s/p cath now with LLE weakness EXAM: CT ANGIOGRAPHY HEAD AND NECK TECHNIQUE: Multidetector CT imaging of the head and neck was performed  using the standard protocol during bolus administration of intravenous contrast. Multiplanar CT image reconstructions and MIPs were obtained to evaluate the vascular anatomy. Carotid stenosis measurements (when applicable) are obtained utilizing NASCET criteria, using the distal internal carotid diameter as the denominator. RADIATION DOSE REDUCTION: This exam was performed according to the departmental dose-optimization program which includes automated exposure control, adjustment of the mA and/or kV according to patient size and/or use of iterative reconstruction technique. CONTRAST:  160m OMNIPAQUE IOHEXOL 350 MG/ML SOLN COMPARISON:  None Available. FINDINGS: CT HEAD FINDINGS Brain: No evidence of acute large vascular territory infarction, hemorrhage, hydrocephalus, extra-axial collection or mass lesion/mass effect. Partially empty sella. Vascular: See below. Skull: No acute fracture. Sinuses: Clear sinuses. Orbits: No acute finding. Review of the MIP images confirms the above findings CTA NECK FINDINGS Aortic arch: Great vessel origins are patent without significant stenosis. Right carotid system: Atherosclerosis at the carotid bifurcation without greater than 50% stenosis. The common carotid artery is displaced laterally by the enlarged thyroid. Left carotid system: Atherosclerosis at the carotid bifurcation without greater than 50% stenosis. The common carotid artery is displaced laterally by the enlarged thyroid. Vertebral arteries: Left dominant. No significant (greater than 50%) stenosis. Skeleton: No acute findings. Other neck: Enlarged and heterogeneous thyroid. Upper chest: Visualized lung apices are clear. Review of the MIP images confirms the above findings CTA HEAD FINDINGS Anterior circulation: Bilateral intracranial ICAs, MCAs, and ACAs are patent without proximal hemodynamically significant stenosis. Posterior circulation: Left dominant. Bilateral intradural vertebral arteries, basilar artery and  both posterior cerebral arteries are patent without proximal hemodynamically significant stenosis. Small bilateral P1 PCAs with prominent bilateral posterior communicating arteries, anatomic variant. Venous sinuses: No evidence of dural venous sinus thrombosis. Small left transverse sinus. Anatomic variants: Detailed above. Review of the MIP images confirms the above findings IMPRESSION: 1. No evidence of acute intracranial abnormality. 2. No emergent large vessel occlusion or proximal hemodynamically significant stenosis. 3. Partially empty sella and narrowed left transverse sinus. These findings are nonspecific and often incidental anatomic variant, but can be seen with idiopathic intracranial hypertension in the correct clinical setting. 4. Enlarged heterogeneous thyroid. Recommend thyroid ultrasound (ref: J Am Coll Radiol. 2015 Feb;12(2): 143-50). Electronically Signed   By: FMargaretha SheffieldM.D.   On: 03/30/2022 14:53   MR BRAIN WO CONTRAST  Result Date: 03/31/2022 CLINICAL DATA:  Stroke follow-up scan.  Headache. EXAM: MRI HEAD WITHOUT CONTRAST TECHNIQUE: Multiplanar, multiecho pulse sequences of the brain  and surrounding structures were obtained without intravenous contrast. COMPARISON:  Head CT from yesterday FINDINGS: Brain: Subcentimeter acute infarcts in the right temporal, right occipital, and bifrontal cortex. Acute lacunar infarct in the right thalamus. Mild chronic vessel ischemia hemispheric white matter. Brain volume is normal. No hemorrhage, hydrocephalus, or collection. Vascular: Major flow voids are preserved Skull and upper cervical spine: No focal marrow lesion Sinuses/Orbits: No acute finding IMPRESSION: 1. Small scattered acute infarcts in the cerebral cortex and right thalamus, pattern suggesting central embolic disease. 2. Mild chronic small vessel ischemia. Electronically Signed   By: Jorje Guild M.D.   On: 03/31/2022 11:33   CARDIAC CATHETERIZATION  Result Date: 03/29/2022    RPDA-2 lesion is 99% stenosed.   RPDA-1 lesion is 40% stenosed.   2nd Diag lesion is 80% stenosed.   Previously placed Mid Cx stent (unknown type) is  widely patent.   A stent was successfully placed.   Post intervention, there is a 0% residual stenosis.   LV end diastolic pressure is normal. 1.  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 2.  High-grade second diagonal lesion which should be treated medically due to proximity to LAD. 3.  LVEDP of 11 mmHg The results were reviewed with Dr. Johney Frame. 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   CT FOREARM RIGHT WO CONTRAST  Result Date: 03/30/2022 CLINICAL DATA:  Forearm trauma status post right radial catheterization. EXAM: CT OF THE RIGHT FOREARM WITHOUT CONTRAST TECHNIQUE: Multidetector CT imaging was performed according to the standard protocol. Multiplanar CT image reconstructions were also generated. RADIATION DOSE REDUCTION: This exam was performed according to the departmental dose-optimization program which includes automated exposure control, adjustment of the mA and/or kV according to patient size and/or use of iterative reconstruction technique. COMPARISON:  None Available. FINDINGS: Bones/Joint/Cartilage No evidence of fracture or dislocation. No evidence of arthropathy. No periosteal reaction. Ligaments Suboptimally assessed by CT. Muscles and Tendons Muscles of the flexor and access stent compartments are within normal limits. Thenar and hypothenar muscles are normal in caliber. Soft tissues Moderate subcutaneous soft tissue edema about the radial aspect of the forearm. No fluid collection or seroma. IMPRESSION: 1. Moderate subcutaneous soft tissue edema about the radial aspect of the forearm without evidence of drainable fluid collection or hematoma/seroma. 2. Muscles are normal in bulk and  density. The tendons of the flexor and extensor compartments are within normal limits. 3.  No acute osseous abnormality. Electronically Signed   By: Keane Police D.O.   On: 03/30/2022 15:57   DG Chest Port 1 View  Result Date: 03/31/2022 CLINICAL DATA:  Stroke Coronary artery disease Hypertension EXAM: PORTABLE CHEST - 1 VIEW COMPARISON:  05/22/2022 FINDINGS: Cardiomediastinal silhouette and pulmonary vasculature are within normal limits. Lungs are clear. IMPRESSION: No acute cardiopulmonary process. Electronically Signed   By: Miachel Roux M.D.   On: 03/31/2022 07:23   DG Chest Port 1 View  Result Date: 03/29/2022 CLINICAL DATA:  Chest pain EXAM: PORTABLE CHEST 1 VIEW COMPARISON:  03/23/2022 FINDINGS: Cardiac and mediastinal contours are within normal limits given AP technique. No focal pulmonary opacity. No pleural effusion or pneumothorax. No acute osseous abnormality. IMPRESSION: No acute cardiopulmonary process. Electronically Signed   By: Merilyn Baba M.D.   On: 03/29/2022 03:33   VAS [place] UPPER EXTREMITY ARTERIAL DUPLEX  Result Date: 03/31/2022  UPPER EXTREMITY DUPLEX STUDY Patient Name:  DENIS KOPPEL Whorley  Date of Exam:   03/31/2022 Medical Rec #: [id number]       Accession #:    [id number] Date of Birth: 1966-09-12      Patient Gender: F Patient Age:   56 years Exam Location:  [facility] Procedure:      VAS [place] UPPER EXTREMITY ARTERIAL DUPLEX Referring Phys: --------------------------------------------------------------------------------  Indications: Pain, swelling, bruising. History:     Patient has a history of catheterization via right radial artery.  Risk Factors: Hypertension, hyperlipidemia, Diabetes, no history of smoking,               prior MI, coronary artery disease, prior CVA. Comparison Study: No prior studies. Performing Technologist: Darlin Coco RDMS, RVT  Examination Guidelines: A complete evaluation includes B-mode imaging, spectral Doppler, color Doppler, and power Doppler  as needed of all accessible portions of each vessel. Bilateral testing is considered an integral part of a complete examination. Limited examinations for reoccurring indications may be performed as noted.  Right Doppler Findings: +---------------+----------+---------+--------+--------+ Site           PSV (cm/s)Waveform StenosisComments +---------------+----------+---------+--------+--------+ Subclavian Prox131       triphasic                 +---------------+----------+---------+--------+--------+ Subclavian Mid 102       triphasic                 +---------------+----------+---------+--------+--------+ Subclavian Dist121       triphasic                 +---------------+----------+---------+--------+--------+ Axillary       137       triphasic                 +---------------+----------+---------+--------+--------+ Brachial Prox  116       triphasic                 +---------------+----------+---------+--------+--------+ Brachial Mid   112       triphasic                 +---------------+----------+---------+--------+--------+ Brachial Dist  116       triphasic                 +---------------+----------+---------+--------+--------+ Radial Prox    68        triphasic                 +---------------+----------+---------+--------+--------+ Radial Mid     76        triphasic                 +---------------+----------+---------+--------+--------+ Radial Dist    49        triphasic                 +---------------+----------+---------+--------+--------+ Ulnar Prox     47        triphasic                 +---------------+----------+---------+--------+--------+ Ulnar Mid      77        triphasic                 +---------------+----------+---------+--------+--------+ Ulnar Dist     48        triphasic                 +---------------+----------+---------+--------+--------+ Palmar Arch    62        triphasic                  +---------------+----------+---------+--------+--------+  Summary:  Right: No obstruction visualized in the right upper extremity. *See table(s) above for measurements and observations. Electronically signed by Servando Snare MD on 03/31/2022 at 12:57:32 PM.    Final    ECHOCARDIOGRAM COMPLETE  Result Date: 03/30/2022    ECHOCARDIOGRAM REPORT   Patient Name:   DEVORY MCKINZIE Fare Date of Exam: 03/30/2022 Medical Rec #:  [id number]      Height:       65.0 in Accession #:    [id number]     Weight:       200.0 lb Date of Birth:  [birth year]     BSA:          1.978 m Patient Age:    56 years       BP:           110/65 mmHg Patient Gender: F              HR:           62 bpm. Exam Location:  Inpatient Procedure: 2D Echo, Cardiac Doppler, Color Doppler and Intracardiac            Opacification Agent Indications:    NSTEMI  History:        Patient has prior history of Echocardiogram examinations, most                 recent 07/02/2020. CAD and Previous Myocardial Infarction,                 Signs/Symptoms:Chest Pain; Risk Factors:Hypertension and                 Diabetes. Sarcoidosis.  Sonographer:    Clayton Lefort RDCS (AE) Referring Phys: [id number] [facility] [place] IMPRESSIONS  1. Cystic liver disease noted.  2. Left ventricular ejection fraction, by estimation, is 60 to 65%. The left ventricle has normal function. The left ventricle has no regional wall motion abnormalities. There is moderate left ventricular hypertrophy. Left ventricular diastolic parameters were normal.  3. Right ventricular systolic function is normal. The right ventricular size is normal.  4. The mitral valve is normal in structure. No evidence of mitral valve regurgitation. No evidence of mitral stenosis.  5. The aortic valve is tricuspid. There is mild calcification of the aortic valve. Aortic valve regurgitation is mild. Aortic valve sclerosis is present, with no evidence of aortic valve stenosis.  6. The inferior vena cava is normal in size with  greater than 50% respiratory variability, suggesting right atrial pressure of 3 mmHg. FINDINGS  Left Ventricle: Left ventricular ejection fraction, by estimation, is 60 to 65%. The left ventricle has normal function. The left ventricle has no regional wall motion abnormalities. Definity contrast agent was given IV to delineate the left ventricular  endocardial borders. The left ventricular internal cavity size was normal in size. There is moderate left ventricular hypertrophy. Left ventricular diastolic parameters were normal. Right Ventricle: The right ventricular size is normal. No increase in right ventricular wall thickness. Right ventricular systolic function is normal. Left Atrium: Left atrial size was normal in size. Right Atrium: Right atrial size was normal in size. Pericardium: There is no evidence of pericardial effusion. Mitral Valve: The mitral valve is normal in structure. No evidence of mitral valve regurgitation. No evidence of mitral valve stenosis. Tricuspid Valve: The tricuspid valve is normal in structure. Tricuspid valve regurgitation is not demonstrated. No evidence of tricuspid stenosis. Aortic Valve: The aortic valve is tricuspid. There is mild calcification of  the aortic valve. Aortic valve regurgitation is mild. Aortic valve sclerosis is present, with no evidence of aortic valve stenosis. Pulmonic Valve: The pulmonic valve was normal in structure. Pulmonic valve regurgitation is not visualized. No evidence of pulmonic stenosis. Aorta: The aortic root is normal in size and structure. Venous: The inferior vena cava is normal in size with greater than 50% respiratory variability, suggesting right atrial pressure of 3 mmHg. IAS/Shunts: No atrial level shunt detected by color flow Doppler. Additional Comments: Cystic liver disease noted.  LEFT VENTRICLE PLAX 2D LVOT diam:     2.10 cm   Diastology LVOT Area:     3.46 cm  LV e' medial:    5.98 cm/s                          LV E/e' medial:  11.2                           LV e' lateral:   17.10 cm/s                          LV E/e' lateral: 3.9  IVC IVC diam: 1.80 cm LEFT ATRIUM              Index        RIGHT ATRIUM           Index LA Vol (A2C):   37.4 ml  18.91 ml/m  RA Area:     14.80 cm LA Vol (A4C):   100.0 ml 50.55 ml/m  RA Volume:   31.30 ml  15.82 ml/m LA Biplane Vol: 67.5 ml  34.12 ml/m   AORTA Ao Root diam: 3.20 cm Ao Asc diam:  0.40 cm MITRAL VALVE MV Area (PHT): 3.03 cm    SHUNTS MV Decel Time: 250 msec    Systemic Diam: 2.10 cm MV E velocity: 66.90 cm/s MV A velocity: 50.50 cm/s MV E/A ratio:  1.32 Jenkins Rouge MD Electronically signed by Jenkins Rouge MD Signature Date/Time: 03/30/2022/11:36:07 AM    Final    ECHO TEE  Result Date: 04/02/2022    TRANSESOPHOGEAL ECHO REPORT   Patient Name:   CAPRICE MCCAFFREY Patteson Date of Exam: 04/02/2022 Medical Rec #:  [id number]      Height:       65.0 in Accession #:    [id number]     Weight:       200.0 lb Date of Birth:  [birth year]     BSA:          1.978 m Patient Age:    55 years       BP:           124/58 mmHg Patient Gender: F              HR:           76 bpm. Exam Location:  Inpatient Procedure: 3D Echo Indications:     Cerebral Infarction, unspecified I63.9  History:         Patient has prior history of Echocardiogram examinations, most                  recent 03/25/2022. CAD and NSTEMI; Risk Factors:Hypertension,                  Diabetes and Dyslipidemia. Sarcoidosis.  Sonographer:     Darlina Sicilian RDCS  Referring Phys:  1993 RHONDA G BARRETT Diagnosing Phys: Eleonore Chiquito MD PROCEDURE: After discussion of the risks and benefits of a TEE, an informed consent was obtained from the patient. TEE procedure time was 7 minutes. The transesophogeal probe was passed without difficulty through the esophogus of the patient. Imaged were  obtained with the patient in a left lateral decubitus position. Sedation performed by different physician. The patient was monitored while under deep sedation. Anesthestetic  sedation was provided intravenously by Anesthesiology: 335m of Propofol, 1035m of Lidocaine. Image quality was excellent. The patient's vital signs; including heart rate, blood pressure, and oxygen saturation; remained stable throughout the procedure. The patient developed no complications during the procedure. IMPRESSIONS  1. Left ventricular ejection fraction, by estimation, is 60 to 65%. The left ventricle has normal function.  2. Right ventricular systolic function is normal. The right ventricular size is normal.  3. No left atrial/left atrial appendage thrombus was detected. The LAA emptying velocity was 76 cm/s.  4. The mitral valve is grossly normal. Trivial mitral valve regurgitation. No evidence of mitral stenosis.  5. The aortic valve is tricuspid. Aortic valve regurgitation is mild. No aortic stenosis is present.  6. Agitated saline contrast bubble study was negative, with no evidence of any interatrial shunt. Conclusion(s)/Recommendation(s): No LA/LAA thrombus identified. Negative bubble study for interatrial shunt. No intracardiac source of embolism detected on this on this transesophageal echocardiogram. FINDINGS  Left Ventricle: Left ventricular ejection fraction, by estimation, is 60 to 65%. The left ventricle has normal function. The left ventricular internal cavity size was normal in size. Right Ventricle: The right ventricular size is normal. No increase in right ventricular wall thickness. Right ventricular systolic function is normal. Left Atrium: Left atrial size was normal in size. No left atrial/left atrial appendage thrombus was detected. The LAA emptying velocity was 76 cm/s. Right Atrium: Right atrial size was normal in size. Pericardium: Trivial pericardial effusion is present. Mitral Valve: The mitral valve is grossly normal. Trivial mitral valve regurgitation. No evidence of mitral valve stenosis. Tricuspid Valve: The tricuspid valve is grossly normal. Tricuspid valve regurgitation is  mild . No evidence of tricuspid stenosis. Aortic Valve: The aortic valve is tricuspid. Aortic valve regurgitation is mild. No aortic stenosis is present. Pulmonic Valve: The pulmonic valve was grossly normal. Pulmonic valve regurgitation is trivial. No evidence of pulmonic stenosis. Aorta: The aortic root and ascending aorta are structurally normal, with no evidence of dilitation. There is minimal (Grade I) layered plaque involving the descending aorta. Venous: The left upper pulmonary vein, left lower pulmonary vein, right lower pulmonary vein and right upper pulmonary vein are normal. IAS/Shunts: No atrial level shunt detected by color flow Doppler. Agitated saline contrast was given intravenously to evaluate for intracardiac shunting. Agitated saline contrast bubble study was negative, with no evidence of any interatrial shunt.   AORTA Ao Root diam: 3.60 cm Ao Asc diam:  3.56 cm TRICUSPID VALVE TR Peak grad:   16.6 mmHg TR Vmax:        204.00 cm/s WeEleonore ChiquitoD Electronically signed by WeEleonore ChiquitoD Signature Date/Time: 04/02/2022/11:58:59 AM    Final    CT HEAD CODE STROKE WO CONTRAST  Result Date: 03/30/2022 CLINICAL DATA:  Code stroke.  Neuro deficit, acute, stroke suspected EXAM: CT HEAD WITHOUT CONTRAST TECHNIQUE: Contiguous axial images were obtained from the base of the skull through the vertex without intravenous contrast. RADIATION DOSE REDUCTION: This exam was performed according to the departmental dose-optimization program which  includes automated exposure control, adjustment of the mA and/or kV according to patient size and/or use of iterative reconstruction technique. COMPARISON:  None Available. FINDINGS: Brain: No evidence of acute large vascular territory infarction, hemorrhage, hydrocephalus, extra-axial collection or mass lesion/mass effect. Partially empty sella. Vascular: No hyperdense vessel identified Skull: No acute fracture. Sinuses/Orbits: Clear sinuses.  No acute orbital  findings. Other: No mastoid effusions. ASPECTS [facility] Stroke Program Early CT Score) total score (0-10 with 10 being normal): 10. IMPRESSION: 1. No evidence of acute intracranial abnormality.  ASPECTS is 10. 2. Partially empty sella, which is often a normal anatomic variant but can be associated with idiopathic intracranial hypertension. Code stroke imaging results were communicated on 03/30/2022 at 1:51 pm to provider [facility] via secure text paging. Electronically Signed   By: Margaretha Sheffield M.D.   On: 03/30/2022 13:55   VAS [place] UPPER EXTREMITY VENOUS DUPLEX  Result Date: 03/31/2022 UPPER VENOUS STUDY  Patient Name:  HAZLEY DEZEEUW Dicamillo  Date of Exam:   03/31/2022 Medical Rec #: [id number]       Accession #:    [id number] Date of Birth: [birth year]      Patient Gender: F Patient Age:   56 years Exam Location:  [facility] Procedure:      VAS [place] UPPER EXTREMITY VENOUS DUPLEX Referring Phys: Cornelius Moras XU --------------------------------------------------------------------------------  Indications: Swelling, pain, bruising Other Indications: S/P radial catheterization. Comparison Study: No prior studies. Performing Technologist: Darlin Coco RDMS, RVT  Examination Guidelines: A complete evaluation includes B-mode imaging, spectral Doppler, color Doppler, and power Doppler as needed of all accessible portions of each vessel. Bilateral testing is considered an integral part of a complete examination. Limited examinations for reoccurring indications may be performed as noted.  Right Findings: +----------+------------+---------+-----------+----------+-------+ RIGHT     CompressiblePhasicitySpontaneousPropertiesSummary +----------+------------+---------+-----------+----------+-------+ IJV           Full       Yes       Yes                      +----------+------------+---------+-----------+----------+-------+ Subclavian               Yes       Yes                       +----------+------------+---------+-----------+----------+-------+ Axillary      Full       Yes       Yes                      +----------+------------+---------+-----------+----------+-------+ Brachial      Full                                          +----------+------------+---------+-----------+----------+-------+ Radial        Full                                          +----------+------------+---------+-----------+----------+-------+ Ulnar         Full                                          +----------+------------+---------+-----------+----------+-------+ Cephalic  Full                                          +----------+------------+---------+-----------+----------+-------+ Basilic       Full                                          +----------+------------+---------+-----------+----------+-------+  Left Findings: +----------+------------+---------+-----------+----------+-------+ LEFT      CompressiblePhasicitySpontaneousPropertiesSummary +----------+------------+---------+-----------+----------+-------+ Subclavian               Yes       Yes                      +----------+------------+---------+-----------+----------+-------+  Summary:  Right: No evidence of deep vein thrombosis in the upper extremity. No evidence of superficial vein thrombosis in the upper extremity.  Left: No evidence of thrombosis in the subclavian.  *See table(s) above for measurements and observations.  Diagnosing physician: Servando Snare MD Electronically signed by Servando Snare MD on 03/31/2022 at 12:57:24 PM.    Final    CT ANGIO HEAD NECK W WO CM (CODE STROKE)  Result Date: 03/30/2022 CLINICAL DATA:  Neuro deficit, acute, stroke suspected s/p cath now with LLE weakness EXAM: CT ANGIOGRAPHY HEAD AND NECK TECHNIQUE: Multidetector CT imaging of the head and neck was performed using the standard protocol during bolus administration of intravenous contrast.  Multiplanar CT image reconstructions and MIPs were obtained to evaluate the vascular anatomy. Carotid stenosis measurements (when applicable) are obtained utilizing NASCET criteria, using the distal internal carotid diameter as the denominator. RADIATION DOSE REDUCTION: This exam was performed according to the departmental dose-optimization program which includes automated exposure control, adjustment of the mA and/or kV according to patient size and/or use of iterative reconstruction technique. CONTRAST:  121m OMNIPAQUE IOHEXOL 350 MG/ML SOLN COMPARISON:  None Available. FINDINGS: CT HEAD FINDINGS Brain: No evidence of acute large vascular territory infarction, hemorrhage, hydrocephalus, extra-axial collection or mass lesion/mass effect. Partially empty sella. Vascular: See below. Skull: No acute fracture. Sinuses: Clear sinuses. Orbits: No acute finding. Review of the MIP images confirms the above findings CTA NECK FINDINGS Aortic arch: Great vessel origins are patent without significant stenosis. Right carotid system: Atherosclerosis at the carotid bifurcation without greater than 50% stenosis. The common carotid artery is displaced laterally by the enlarged thyroid. Left carotid system: Atherosclerosis at the carotid bifurcation without greater than 50% stenosis. The common carotid artery is displaced laterally by the enlarged thyroid. Vertebral arteries: Left dominant. No significant (greater than 50%) stenosis. Skeleton: No acute findings. Other neck: Enlarged and heterogeneous thyroid. Upper chest: Visualized lung apices are clear. Review of the MIP images confirms the above findings CTA HEAD FINDINGS Anterior circulation: Bilateral intracranial ICAs, MCAs, and ACAs are patent without proximal hemodynamically significant stenosis. Posterior circulation: Left dominant. Bilateral intradural vertebral arteries, basilar artery and both posterior cerebral arteries are patent without proximal hemodynamically  significant stenosis. Small bilateral P1 PCAs with prominent bilateral posterior communicating arteries, anatomic variant. Venous sinuses: No evidence of dural venous sinus thrombosis. Small left transverse sinus. Anatomic variants: Detailed above. Review of the MIP images confirms the above findings IMPRESSION: 1. No evidence of acute intracranial abnormality. 2. No emergent large vessel occlusion or proximal hemodynamically significant stenosis. 3. Partially empty sella and narrowed left transverse sinus. These  findings are nonspecific and often incidental anatomic variant, but can be seen with idiopathic intracranial hypertension in the correct clinical setting. 4. Enlarged heterogeneous thyroid. Recommend thyroid ultrasound (ref: J Am Coll Radiol. 2015 Feb;12(2): 143-50). Electronically Signed   By: Margaretha Sheffield M.D.   On: 03/30/2022 14:53    Microbiology: Results for orders placed or performed during the hospital encounter of 03/29/22  MRSA Next Gen by PCR, Nasal     Status: Abnormal   Collection Time: 03/29/22  4:59 PM   Specimen: Nasal Mucosa; Nasal Swab  Result Value Ref Range Status   MRSA by PCR Next Gen DETECTED (A) NOT DETECTED Final    Comment: RESULT CALLED TO, READ BACK BY AND VERIFIED WITH: S,KING RN '@1920'  03/29/22 E,[place] (NOTE) The GeneXpert MRSA Assay (FDA approved for NASAL specimens only), is one component of a comprehensive MRSA colonization surveillance program. It is not intended to diagnose MRSA infection nor to guide or monitor treatment for MRSA infections. Test performance is not FDA approved in patients less than 15 years old. Performed at [facility], [place] [address], [place], [place] [address]     Labs: CBC: Recent Labs  Lab 04/02/22 0327 04/03/22 0329 04/04/22 0308 04/05/22 0055  WBC 7.7 6.5 7.3 6.6  HGB 12.1 11.4* 10.6* 10.9*  HCT 36.0 34.0* 30.9* 32.5*  MCV 95.2 96.0 94.5 96.2  PLT 204 211 208 066   Basic Metabolic Panel: Recent Labs   Lab 04/02/22 0327 04/03/22 0329 04/04/22 0308 04/05/22 0055  NA 138 140 142 140  K 3.7 3.5 3.6 3.9  CL 107 111 113* 111  CO2 '25 23 23 24  ' GLUCOSE 154* 132* 95 180*  BUN 23* 21* 15 17  CREATININE 1.02* 0.78 0.79 0.92  CALCIUM 9.3 8.5* 8.5* 8.7*   Liver Function Tests: No results for input(s): AST, ALT, ALKPHOS, BILITOT, PROT, ALBUMIN in the last 168 hours. CBG: Recent Labs  Lab 04/07/22 1629 04/07/22 2149 04/08/22 0616 04/08/22 0630 04/08/22 1111  GLUCAP 235* 176* 171* 175* 177*    Discharge time spent: greater than 30 minutes.  Signed: Flora Lipps, MD [facility] 04/08/2022

## 2022-04-11 ENCOUNTER — Ambulatory Visit: Payer: Medicaid Other | Admitting: General Practice
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
## 2022-04-17 ENCOUNTER — Telehealth (HOSPITAL_COMMUNITY): Payer: Self-pay

## 2022-04-17 ENCOUNTER — Encounter: Payer: Self-pay | Admitting: General Practice

## 2022-04-17 ENCOUNTER — Ambulatory Visit (INDEPENDENT_AMBULATORY_CARE_PROVIDER_SITE_OTHER): Payer: 59 | Admitting: General Practice

## 2022-04-17 VITALS — BP 122/60 | HR 60 | Ht 67.0 in

## 2022-04-17 DIAGNOSIS — E785 Hyperlipidemia, unspecified: Secondary | ICD-10-CM | POA: Diagnosis not present

## 2022-04-17 DIAGNOSIS — I2510 Atherosclerotic heart disease of native coronary artery without angina pectoris: Secondary | ICD-10-CM | POA: Diagnosis not present

## 2022-04-17 DIAGNOSIS — I10 Essential (primary) hypertension: Secondary | ICD-10-CM

## 2022-04-17 DIAGNOSIS — Z8673 Personal history of transient ischemic attack (TIA), and cerebral infarction without residual deficits: Secondary | ICD-10-CM | POA: Diagnosis not present

## 2022-04-17 DIAGNOSIS — E0800 Diabetes mellitus due to underlying condition with hyperosmolarity without nonketotic hyperglycemic-hyperosmolar coma (NKHHC): Secondary | ICD-10-CM

## 2022-04-17 NOTE — Telephone Encounter (Signed)
Attempted to call patient in regards to Cardiac Rehab - LM on VM 

## 2022-04-17 NOTE — Patient Instructions (Signed)
Medication Instructions:  The current medical regimen is effective;  continue present plan and medications as directed. Please refer to the Current Medication list given to you today.   *If you need a refill on your cardiac medications before your next appointment, please call your pharmacy*  Lab Work:        FASTING LIPID AND LFT IN 6 WEEKS If you have labs (blood work) drawn today and your tests are completely normal, you will receive your results only by: MyChart Message (if you have MyChart) OR  A paper copy in the mail If you have any lab test that is abnormal or we need to change your treatment, we will call you to review the results.  Special Instructions FOLLOW INCREASED FIBER DIET ATTACHED  Follow-Up: Your next appointment:  3-4 month(s) In Person with Dayton Ishikawa, MD    At [facility], you and your health needs are our priority.  As part of our continuing mission to provide you with exceptional heart care, we have created designated Provider Care Teams.  These Care Teams include your primary Cardiologist (physician) and Advanced Practice Providers (APPs -  Physician Assistants and Nurse Practitioners) who all work together to provide you with the care you need, when you need it.    Important Information About Sugar       High-Fiber Eating Plan Fiber, also called dietary fiber, is a type of carbohydrate. It is found foods such as fruits, vegetables, whole grains, and beans. A high-fiber diet can have many health benefits. Your health care provider may recommend a high-fiber diet to help: Prevent constipation. Fiber can make your bowel movements more regular. Lower your cholesterol. Relieve the following conditions: Inflammation of veins in the anus (hemorrhoids). Inflammation of specific areas of the digestive tract (uncomplicated diverticulosis). A problem of the large intestine, also called the colon, that sometimes causes pain and diarrhea (irritable bowel  syndrome, or IBS). Prevent overeating as part of a weight-loss plan. Prevent heart disease, type 2 diabetes, and certain cancers. What are tips for following this plan? Reading food labels  Check the nutrition facts label on food products for the amount of dietary fiber. Choose foods that have 5 grams of fiber or more per serving. The goals for recommended daily fiber intake include: Men (age 20 or younger): 34-38 g. Men (over age 76): 28-34 g. Women (age 61 or younger): 25-28 g. Women (over age 80): 22-25 g. Your daily fiber goal is _____________ g. Shopping Choose whole fruits and vegetables instead of processed forms, such as apple juice or applesauce. Choose a wide variety of high-fiber foods such as avocados, lentils, oats, and kidney beans. Read the nutrition facts label of the foods you choose. Be aware of foods with added fiber. These foods often have high sugar and sodium amounts per serving. Cooking Use whole-grain flour for baking and cooking. Cook with brown rice instead of white rice. Meal planning Start the day with a breakfast that is high in fiber, such as a cereal that contains 5 g of fiber or more per serving. Eat breads and cereals that are made with whole-grain flour instead of refined flour or white flour. Eat brown rice, bulgur wheat, or millet instead of white rice. Use beans in place of meat in soups, salads, and pasta dishes. Be sure that half of the grains you eat each day are whole grains. General information You can get the recommended daily intake of dietary fiber by: Eating a variety of fruits,  vegetables, grains, nuts, and beans. Taking a fiber supplement if you are not able to take in enough fiber in your diet. It is better to get fiber through food than from a supplement. Gradually increase how much fiber you consume. If you increase your intake of dietary fiber too quickly, you may have bloating, cramping, or gas. Drink plenty of water to help you  digest fiber. Choose high-fiber snacks, such as berries, raw vegetables, nuts, and popcorn. What foods should I eat? Fruits Berries. Pears. Apples. Oranges. Avocado. Prunes and raisins. Dried figs. Vegetables Sweet potatoes. Spinach. Kale. Artichokes. Cabbage. Broccoli. Cauliflower. Green peas. Carrots. Squash. Grains Whole-grain breads. Multigrain cereal. Oats and oatmeal. Brown rice. Barley. Bulgur wheat. Millet. Quinoa. Bran muffins. Popcorn. Rye wafer crackers. Meats and other proteins Navy beans, kidney beans, and pinto beans. Soybeans. Split peas. Lentils. Nuts and seeds. Dairy Fiber-fortified yogurt. Beverages Fiber-fortified soy milk. Fiber-fortified orange juice. Other foods Fiber bars. The items listed above may not be a complete list of recommended foods and beverages. Contact a dietitian for more information. What foods should I avoid? Fruits Fruit juice. Cooked, strained fruit. Vegetables Fried potatoes. Canned vegetables. Well-cooked vegetables. Grains White bread. Pasta made with refined flour. White rice. Meats and other proteins Fatty cuts of meat. Fried chicken or fried fish. Dairy Milk. Yogurt. Cream cheese. Sour cream. Fats and oils Butters. Beverages Soft drinks. Other foods Cakes and pastries. The items listed above may not be a complete list of foods and beverages to avoid. Talk with your dietitian about what choices are best for you. Summary Fiber is a type of carbohydrate. It is found in foods such as fruits, vegetables, whole grains, and beans. A high-fiber diet has many benefits. It can help to prevent constipation, lower blood cholesterol, aid weight loss, and reduce your risk of heart disease, diabetes, and certain cancers. Increase your intake of fiber gradually. Increasing fiber too quickly may cause cramping, bloating, and gas. Drink plenty of water while you increase the amount of fiber you consume. The best sources of fiber include whole  fruits and vegetables, whole grains, nuts, seeds, and beans. This information is not intended to replace advice given to you by your health care provider. Make sure you discuss any questions you have with your health care provider. Document Revised: 02/23/2020 Document Reviewed: 02/23/2020 Elsevier Patient Education  2023 [employer].

## 2022-04-18 ENCOUNTER — Telehealth: Payer: Self-pay | Admitting: Licensed Clinical Social Worker

## 2022-04-18 NOTE — Progress Notes (Signed)
Heart and Vascular Care Navigation  04/18/2022  [name] Apr 14, 1966 [id number]  Reason for Referral:  Patient is participating in a Managed Medicaid Plan: No Re-engaged w/ care coordination/self-referral Engaged with patient by telephone for follow up visit for Heart and Vascular Care Coordination.                                                                                                   Assessment:       LCSW received request to contact pt after appt yesterday- pt previously had worked with this Clinical research associate but then was lost to f/u and no showed multiple appointments with PCP and Heartcare. I called pt this morning, was able to reach her at [phone]. 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 She inquires if she had another Medicaid application and disability resubmitted. I am unable to see all notes from [employer], I will call them and speak with them about any pending applications and f/u with pt. Pt aware she does currently have insurance per our records and that she would need to utilize her insurance for medications as she would not be eligible for [employer] renewal at this time. Pt anticipated date of discharge from [facility] is next week, I encouraged her to speak with the social worker or nurse case manager at the [facility]PR facility regarding any additional services they are able to arrange for her after discharge.                              HRT/VAS Care Coordination     Patients Home Cardiology Office [facility]   Outpatient Care Team Social Worker   Social Worker Name: Nile Riggs, [place] [facility] [phone]   Living arrangements for the past 2 months Apartment   Lives with: Other (Comment)  "my client"   Patient Current Insurance Coverage Self-Pay   Patient Has Concern With Paying Medical Bills Yes   Patient  Concerns With Medical Bills has insurance but doesnt understand what it does and doesnt cover   Medical Bill Referrals: f/u with [employer] regarding Medicaid and Disability   Does Patient Have Prescription Coverage? Yes   Patient Prescription Assistance Programs [place] [facility]; Blue Card   Blue Card Medications pt approved for [facility] medications   [place] [facility] Medications pt approved for [place] [facility] through 01/01/2022   Home Assistive Devices/Equipment None       Social History:                                                                             SDOH Screenings   Alcohol Screen: Not  on file  Depression (PHQ2-9): Low Risk  (12/06/2020)   Depression (PHQ2-9)    PHQ-2 Score: 0  Financial Resource Strain: Medium Risk (04/18/2022)   Overall Financial Resource Strain (CARDIA)    Difficulty of Paying Living Expenses: Somewhat hard  Food Insecurity: Food Insecurity Present (10/19/2020)   Hunger Vital Sign    Worried About Running Out of Food in the Last Year: Sometimes true    Ran Out of Food in the Last Year: Sometimes true  Housing: Low Risk  (04/18/2022)   Housing    Last Housing Risk Score: 0  Physical Activity: Not on file  Social Connections: Not on file  Stress: Not on file  Tobacco Use: Low Risk  (04/17/2022)   Patient History    Smoking Tobacco Use: Never    Smokeless Tobacco Use: Never    Passive Exposure: Not on file  Transportation Needs: Unmet Transportation Needs (04/18/2022)   PRAPARE - Administrator, Civil Service (Medical): Yes    Lack of Transportation (Non-Medical): Yes    SDOH Interventions: Financial Resources:  Financial Strain Interventions: Other (Comment) (will reassess post IPR discharge as needed) Financial Counseling for [facility] & [employer] for Medicaid/Disability pending apps  Food Insecurity:   Did not assess at this time  Housing Insecurity:  Housing Interventions: Intervention Not Indicated  Transportation:    Transportation Interventions: Other (Comment) (deferred to [facility] to coordinate transportation voucher)     Other Care Navigation Interventions:     Provided Pharmacy assistance resources  Pt states she isnt sure what her benefits are for pharmacy under her current insurance   Follow-up plan:   I sent email to Clement Husbands, supervisor with [employer] financial counselors. I received the following updates from her team: "I did screen Larie and started the application process.  I have sent over the [facility] referral.  [place] ended up having commercial insurance, so her account was recalled from [employer].  We are no longer assigned to work with her account.  This means that she needs to sign some of the application documents, that I would have been able to sign for her had [employer] still been assigned to her account.  I have mailed the forms to her and highlighted where she needs to sign.  I advised her to have someone deliver the application directly to [facility], but if she wanted to bring it to me or mail it back to me, that I would fax the application to DSS for her."  I will f/u with pt next week and advise her of the following, her children may be able to assist her with submitting these forms since they were mailed to her home address. Remain available.

## 2022-04-21 ENCOUNTER — Telehealth: Payer: Self-pay | Admitting: Licensed Clinical Social Worker

## 2022-04-21 ENCOUNTER — Other Ambulatory Visit: Payer: Self-pay

## 2022-04-21 NOTE — Telephone Encounter (Signed)
H&V Care Navigation CSW Progress Note  Clinical Social Worker contacted patient by phone to f/u on questions about Medicaid and Disability.  Patient is participating in a Managed Medicaid Plan:  No, commercial insurance.   LCSW attempted to reach pt this morning at [phone]. No answer, I left brief voicemail explaining that Medicaid application has been started and that pt had been mailed additional documents that would need to be signed and either given to [employer] or taken to DSS directly. Pt referral had also been made to [facility] for disability application support. I shared pt could give me a call back as needed.   SDOH Screenings   Alcohol Screen: Not on file  Depression (PHQ2-9): Low Risk  (12/06/2020)   Depression (PHQ2-9)    PHQ-2 Score: 0  Financial Resource Strain: Medium Risk (04/18/2022)   Overall Financial Resource Strain (CARDIA)    Difficulty of Paying Living Expenses: Somewhat hard  Food Insecurity: Food Insecurity Present (10/19/2020)   Hunger Vital Sign    Worried About Running Out of Food in the Last Year: Sometimes true    Ran Out of Food in the Last Year: Sometimes true  Housing: Low Risk  (04/18/2022)   Housing    Last Housing Risk Score: 0  Physical Activity: Not on file  Social Connections: Not on file  Stress: Not on file  Tobacco Use: Low Risk  (04/17/2022)   Patient History    Smoking Tobacco Use: Never    Smokeless Tobacco Use: Never    Passive Exposure: Not on file  Transportation Needs: Unmet Transportation Needs (04/18/2022)   PRAPARE - Administrator, Civil Service (Medical): Yes    Lack of Transportation (Non-Medical): Yes    Octavio Graves, MSW, LCSW Clinical Social Worker II [facility] Heart/Vascular Care Navigation  [phone]- work cell phone (preferred) [phone]- desk phone

## 2022-04-22 ENCOUNTER — Other Ambulatory Visit: Payer: Self-pay

## 2022-04-22 MED ORDER — PANTOPRAZOLE SODIUM 40 MG PO TBEC
DELAYED_RELEASE_TABLET | ORAL | 0 refills | Status: DC
  Filled 2022-04-22: qty 30, 30d supply, fill #0

## 2022-04-22 MED ORDER — ALPRAZOLAM 0.25 MG PO TABS
ORAL_TABLET | ORAL | 0 refills | Status: DC
  Filled 2022-04-22: qty 30, 7d supply, fill #0

## 2022-04-22 MED ORDER — BACLOFEN 10 MG PO TABS
ORAL_TABLET | ORAL | 0 refills | Status: DC
  Filled 2022-04-22: qty 90, 30d supply, fill #0

## 2022-04-22 MED ORDER — CLOPIDOGREL BISULFATE 75 MG PO TABS
ORAL_TABLET | ORAL | 0 refills | Status: DC
  Filled 2022-04-22: qty 30, 30d supply, fill #0

## 2022-04-22 MED ORDER — ALBUTEROL SULFATE HFA 108 (90 BASE) MCG/ACT IN AERS
INHALATION_SPRAY | RESPIRATORY_TRACT | 0 refills | Status: DC
  Filled 2022-04-22 (×2): qty 8.5, 25d supply, fill #0

## 2022-04-22 MED ORDER — ISOSORBIDE MONONITRATE ER 30 MG PO TB24
ORAL_TABLET | ORAL | 0 refills | Status: DC
  Filled 2022-04-22: qty 30, 30d supply, fill #0

## 2022-04-22 MED ORDER — ROSUVASTATIN CALCIUM 40 MG PO TABS
ORAL_TABLET | ORAL | 0 refills | Status: DC
Start: 2022-04-22 — End: 2022-05-08
  Filled 2022-04-22: qty 7, 7d supply, fill #0

## 2022-04-22 MED ORDER — NITROGLYCERIN 0.4 MG SL SUBL
SUBLINGUAL_TABLET | SUBLINGUAL | 0 refills | Status: AC
  Filled 2022-04-22: qty 25, 25d supply, fill #0
  Filled 2022-05-15: qty 25, 25d supply, fill #1
  Filled 2022-06-06: qty 25, 25d supply, fill #2
  Filled 2022-09-05 – 2022-12-02 (×2): qty 25, 25d supply, fill #0
  Filled 2023-01-19: qty 25, 25d supply, fill #1

## 2022-04-22 MED ORDER — SENNOSIDES-DOCUSATE SODIUM 8.6-50 MG PO TABS
ORAL_TABLET | ORAL | 0 refills | Status: DC
Start: 2022-04-22 — End: 2022-06-26
  Filled 2022-05-17 – 2022-06-06 (×3): qty 30, fill #0

## 2022-04-22 MED ORDER — OXYCODONE HCL 5 MG PO TABS
ORAL_TABLET | ORAL | 0 refills | Status: DC
  Filled 2022-04-22: qty 20, 3d supply, fill #0

## 2022-04-22 MED ORDER — LIDOCAINE 4 % EX PTCH: MEDICATED_PATCH | CUTANEOUS | 0 refills | Status: DC

## 2022-04-22 MED ORDER — CITALOPRAM HYDROBROMIDE 20 MG PO TABS
ORAL_TABLET | ORAL | 0 refills | Status: DC
  Filled 2022-04-22: qty 15, 30d supply, fill #0

## 2022-04-22 MED ORDER — INSULIN GLARGINE 100 UNIT/ML ~~LOC~~ SOLN
SUBCUTANEOUS | 0 refills | Status: DC
  Filled 2022-04-22: qty 20, 30d supply, fill #0
  Filled 2022-04-22: qty 20, 40d supply, fill #0

## 2022-04-22 MED ORDER — IPRATROPIUM BROMIDE 0.03 % NA SOLN
NASAL | 0 refills | Status: DC
  Filled 2022-04-22 – 2022-05-17 (×2): qty 30, 30d supply, fill #0

## 2022-04-22 MED ORDER — CARVEDILOL 25 MG PO TABS
ORAL_TABLET | ORAL | 0 refills | Status: DC
  Filled 2022-04-22: qty 60, 30d supply, fill #0

## 2022-04-22 MED ORDER — JARDIANCE 10 MG PO TABS
ORAL_TABLET | ORAL | 0 refills | Status: DC
  Filled 2022-04-22: qty 14, 14d supply, fill #0
  Filled 2022-04-22: qty 30, 30d supply, fill #0

## 2022-04-22 MED ORDER — LOSARTAN POTASSIUM 25 MG PO TABS
ORAL_TABLET | ORAL | 0 refills | Status: DC
Start: 2022-04-22 — End: 2022-04-24
  Filled 2022-04-22: qty 15, 30d supply, fill #0

## 2022-04-23 ENCOUNTER — Telehealth: Payer: Self-pay | Admitting: Licensed Clinical Social Worker

## 2022-04-23 ENCOUNTER — Other Ambulatory Visit: Payer: Self-pay

## 2022-04-23 NOTE — Telephone Encounter (Signed)
H&V Care Navigation CSW Progress Note  Clinical Social Worker  was contacted by patient  to f/u on Medicaid and Disability application questions. This Clinical research associate shared that [employer] w/ [employer] had sent me the following update: "I did screen Min and started the application process.  I have sent over the [facility] referral.  [place] ended up having commercial insurance, so her account was recalled from [employer].  We are no longer assigned to work with her account.  This means that she needs to sign some of the application documents, that I would have been able to sign for her had [employer] still been assigned to her account.  I have mailed the forms to her and highlighted where she needs to sign.  I advised her to have someone deliver the application directly to [facility], but if she wanted to bring it to me or mail it back to me, that I would fax the application to DSS for her."  Pt states she discharged from [facility] yesterday, her mother is taking care of her at home. She does not think she received any papers from [employer]. I let her know I would message Joaine and ensure paperwork was sent to listed address [address] [place], [place], [address]. Pt agreeable to this. Understands for disability application questions she would need to direct those to the team at the [facility].     Patient is participating in a Managed Medicaid Plan:  No, commercial insurance ([employer]?)  SDOH Screenings   Alcohol Screen: Not on file  Depression (PHQ2-9): Low Risk  (12/06/2020)   Depression (PHQ2-9)    PHQ-2 Score: 0  Financial Resource Strain: Medium Risk (04/18/2022)   Overall Financial Resource Strain (CARDIA)    Difficulty of Paying Living Expenses: Somewhat hard  Food Insecurity: Food Insecurity Present (10/19/2020)   Hunger Vital Sign    Worried About Running Out of Food in the Last Year: Sometimes true    Ran Out of Food in the Last Year: Sometimes true  Housing: Low Risk   (04/18/2022)   Housing    Last Housing Risk Score: 0  Physical Activity: Not on file  Social Connections: Not on file  Stress: Not on file  Tobacco Use: Low Risk  (04/17/2022)   Patient History    Smoking Tobacco Use: Never    Smokeless Tobacco Use: Never    Passive Exposure: Not on file  Transportation Needs: Unmet Transportation Needs (04/18/2022)   PRAPARE - Administrator, Civil Service (Medical): Yes    Lack of Transportation (Non-Medical): Yes   Octavio Graves, MSW, LCSW Clinical Social Worker II [facility] Heart/Vascular Care Navigation  [phone]- work cell phone (preferred) [phone]- desk phone

## 2022-04-23 NOTE — Progress Notes (Signed)
Patient ID: [name], female   DOB: 08-07-1966, 56 y.o.   MRN: [id number]    [name], is a 56 y.o. female  CNO:[id number]  QHU:[id number]  DOB - 1966/03/03  No chief complaint on file.      Subjective:   [name] is a 56 y.o. female here today for a follow up visit After hospitalization 5/27-6/04/2022.  Saw cardiology in f/up 04/17/2022.  Prior to this Admission, she was not taking her glipizide or other meds.  She did not keep appts at scheduled intervals.  She is unable to afford the jardiance they started her on.  She denies any further CP.  Has not seen neurology.  Not taking her insulin bc she can't afford it(semglee and lantus not covered.)  she says she has been having some PT but needs new referral by [place].  Also has disability paperwork for her PCP.  She is unable to walk without assistance now.  She is in a chair today.  Gonzales to no strength in her L leg.  Bowels and bladder moving ok.  Needs RF of meds.  C/o lower back pain.  No further vomiting blood.  No abdominal pain.  No blood in stool.  Can't afford crestor    Recommendations at discharge/from discharge summary:    Follow-up with cardiology and neurology as outpatient.  Office to schedule an appointment. Patient has been started on insulin regimen during hospitalization for diabetes and might benefit from GLP agonist after discharge due to concomitant obesity Neurology has recommended 30-day event monitoring on discharge for stroke.  Recommend outpatient follow-up for cystic liver disease and thyroid ultrasound for enlarged thyroid.   Discharge Diagnoses: Principal Problem:   NSTEMI (non-ST elevated myocardial infarction) ([place]) Active Problems:   Left sided numbness   CAD S/P percutaneous coronary angioplasty   Uncontrolled type 2 diabetes mellitus with hyperglycemia, without long-term current use of insulin (HCC)   Cystic disease of liver   Essential hypertension, benign   Hyperlipidemia   Obesity  (BMI 30-39.9)   Stroke (cerebrum) [facility])   Resolved Problems:   Hematemesis   Chest pain   Hospital Course: 56 year old female with past medical history of coronary artery disease, diabetes mellitus type 2, noncompliance with medication, GERD presented to hospital with hematemesis chest pain and GERD symptoms.  Patient was admitted to the hospital for evaluation of chest pain, ruled in for NSTEMI, underwent cardiac catheterization with stent placement and was cleared by cardiology for discharge.  Hospitalization was notable for hyperglycemia with hemoglobin A1c 12.3, started on insulin. Discharge was contemplated 5/28, patient developed sudden onset left-sided numbness and weakness, code stroke was called.  Patient was then transferred to the neuro ICU and TNK was administered.  Patient was subsequently transferred out of the ICU to the medical team.   At this time, patient has remained stable though she complains of left-sided numbness and left lower extremity weakness.  Medically stable for disposition.  Has been awaiting for rehabilitation placement.   Assessment and Plan: Principal Problem:   NSTEMI (non-ST elevated myocardial infarction) ([place]) Active Problems:   Left sided numbness   CAD S/P percutaneous coronary angioplasty   Uncontrolled type 2 diabetes mellitus with hyperglycemia, without long-term current use of insulin (HCC)   Cystic disease of liver   Essential hypertension, benign   Hyperlipidemia   Obesity (BMI 30-39.9)   Stroke (cerebrum) (HCC)   NSTEMI, CAD: Status post left heart catheterization with stent placement.  Patient does have history of anaphylaxis  to aspirin so we will continue Plavix, Crestor and lisinopril.  Stable at this time.  No further chest pain during hospitalization.   Acute bilateral CVAs, predominantly right sided, consistent with embolic source.  Thought to be secondary to recent catheterization/ cryptogenic in nature.  No cardioembolic source noted  on 2D echocardiogram.  TEE done on 04/02/2022 showed LV ejection fraction of 60 to 65% with no atrial thrombus.  Bubble study was negative as well.  Plan for 30-day monitoring on discharge. Physical therapy has recommended CIR at this time.  Neurology has signed off at this time.  Recommendation is to continue Plavix and statin on discharge..  Still has left-sided weakness and numbness.   Uncontrolled type 2 diabetes mellitus with hyperglycemia, without long-term current use of insulin ([place]):  Latest HbA1c 12.5%.  Continue basal and sliding scale insulin.  Patient would likely benefit from  semaglutide as outpatient  given concomitant obesity.    Cystic disease of liver Will need to follow-up with GI as outpatient.  Significance unknown at this time.   Hematemesis Resolved.  Continue PPI   Essential HTN:  Continue Coreg.  Lisinopril has been changed to losartan as per cardiology.  Blood pressure seems to be stable at this time.  Latest blood pressure of 120/74.   Hyperlipidemia.  Latest LDL 173. Continue Crestor.  LDL goal less than 55.   Obesity: Body mass index is 33.28 kg/m. Could benefit from GLP agonist due to diabetes and obesity.   RUE edema: Negative venous and arterial U/S.  Mild ecchymosis improving.   Enlarged thyroid gland. Plan for thyroid U/S as outpatient.    Debility deconditioning.  At this time, patient has been considered for CIR. Medically stable for disposition   Consultants: Neurology   Procedures performed:  Cardiac catheterization with stent placement. Thrombolytic treatment  From cardiology note 6/152023 [name] is a PMH of essential hypertension, hyperlipidemia, type 2 diabetes, and prior PE.  She presented to the emergency department with chest burning and radiation to her back.  She was found to have elevated troponins.  She had sudden left-sided weakness on 528 and was given tPA.  She was admitted to the neuro ICU.  Her head CT was unremarkable.   Her brain MRI showed small scattered acute infarcts.  She was initiated on clopidogrel monotherapy due to aspirin allergy.  Her TEE was negative.  The etiology was felt to be a complication of her PCI.   She underwent cardiac catheterization and received PCI to her PDA due to in-stent restenosis.  She was discharged in stable condition on 04/08/2022.   She presents to the clinic today for follow-up evaluation states she has been working with physical therapy 3 times per day.  We reviewed her hospitalization and cardiac catheterization.  She expressed understanding.  She reports that she is having a tingling type sensation left arm and notices continued left lower leg weakness.  She presents with lower extremity support stockings on today.  I will continue her current medication regimen and physical activity.  I would like her to eat a heart healthy low-sodium high-fiber diet  1.  Coronary artery disease-no chest pain today.  Denies recurrent episodes of chest discomfort since being discharged from the hospital.  Status post NSTEMI.  Underwent cardiac catheterization and received PCI and DES to her PDA due to ISR. Continue clopidogrel, Imdur, carvedilol, statin Heart healthy low-sodium diet-salty 6 given Increase physical activity as tolerated   Hyperlipidemia-03/31/2022: Cholesterol 254; HDL 42; LDL  Cholesterol 173; Triglycerides 194; VLDL 39 Continue clopidogrel, rosuvastatin Heart healthy low-sodium high fiber diet Increase physical activity as tolerated Lipids and LFTS in 6 weeks   Hypertension-BP today 122/60 Continue carvedilol, Imdur, losartan Heart healthy low-sodium diet-salty 6 given Increase physical activity as tolerated   Acute CVA-Presents in WC today. Slowly increasing physical activity.  Physical therapy 3 times per day Continue clopidogrel, rosuvastatin Follows with neurology   Type 2 diabetes-glucose 180 on 04/05/2022 Continue insulin, rosuvastatin, Jardiance Follows with  PCP   No problems updated.  ALLERGIES: Allergies  Allergen Reactions   Aspirin Anaphylaxis and Swelling    Tongue swells   Tramadol Hives   Hydrocodone Rash   Ibuprofen Itching   Morphine And Related Itching   Orange Fruit [Citrus] Rash    PAST MEDICAL HISTORY: Past Medical History:  Diagnosis Date   Anxiety    Coronary artery disease    Diabetes mellitus    Embolism ([place])    Hypertension    Sarcoidosis     MEDICATIONS AT HOME: Prior to Admission medications   Medication Sig Start Date End Date Taking? Authorizing Provider  glipiZIDE (GLUCOTROL) 10 MG tablet Take 1 tablet (10 mg total) by mouth 2 (two) times daily before a meal. 04/24/22  Yes Nafisa Olds M, PA-C  Insulin Glargine (BASAGLAR KWIKPEN) 100 UNIT/ML Inject 14 Units into the skin in the morning and at bedtime. 04/24/22  Yes Zakai Gonyea M, PA-C  Insulin Pen Needle (PEN NEEDLES) 31G X 5 MM MISC Use as directed 2 (two) times daily. 04/24/22  Yes Argentina Donovan, PA-C  acetaminophen (TYLENOL) 500 MG tablet Take 1 tablet (500 mg total) by mouth every 6 (six) hours as needed. Patient taking differently: Take 500 mg by mouth every 6 (six) hours as needed for moderate pain or headache. 06/16/20   Darr, Edison Nasuti, PA-C  albuterol (VENTOLIN HFA) 108 (90 Base) MCG/ACT inhaler Inhale 2 puffs into the lungs every 6 (six) hours as needed for wheezing or shortness of breath. 04/08/22   Pokhrel, Corrie Mckusick, MD  albuterol (VENTOLIN HFA) 108 (90 Base) MCG/ACT inhaler Inhale 2 puffs by mouth every 6hrs as needed for shortness of breath and wheezing 04/22/22     ALPRAZolam (XANAX) 0.25 MG tablet take 1 tablet by mouth every 6hrs as needed for anxiety 04/22/22     ALPRAZolam (XANAX) 0.5 MG tablet Take 0.5 mg by mouth at bedtime as needed for anxiety.    [provider]  atorvastatin (LIPITOR) 80 MG tablet Take 1 tablet (80 mg total) by mouth daily. 04/24/22   Argentina Donovan, PA-C  baclofen (LIORESAL) 10 MG tablet Take 10 mg by  mouth 3 (three) times daily.    [provider]  baclofen (LIORESAL) 10 MG tablet take 1 tablet by mouth three times a day 04/22/22     Blood Glucose Monitoring Suppl (TRUE METRIX METER) w/Device KIT Check blood sugar BID. 12/10/20   Charlott Rakes, MD  carvedilol (COREG) 25 MG tablet take 1 tablet by mouth twice a day with meals 04/24/22   Freeman Caldron M, PA-C  citalopram (CELEXA) 20 MG tablet take 1/2 tablet by mouth once a day 04/24/22   Argentina Donovan, PA-C  clopidogrel (PLAVIX) 75 MG tablet take 1 tablet by mouth daily 04/24/22   Argentina Donovan, PA-C  famotidine (PEPCID) 20 MG tablet Take 1 tablet (20 mg total) by mouth 2 (two) times daily. 04/24/22   Argentina Donovan, PA-C  ipratropium (ATROVENT) 0.03 % nasal spray  insert 2 sprays into both nostrils four times a day 04/22/22     ipratropium (ATROVENT) 0.06 % nasal spray Place 2 sprays into both nostrils 4 (four) times daily as needed for rhinitis. 02/14/21   Charlott Rakes, MD  isosorbide mononitrate (IMDUR) 30 MG 24 hr tablet Take 1 tablet (30 mg total) by mouth at bedtime. 04/24/22   Argentina Donovan, PA-C  lidocaine (LIDOCAINE PAIN RELIEF) 4 % Place 1 patch onto the skin every morning. 04/24/22   Argentina Donovan, PA-C  losartan (COZAAR) 25 MG tablet Take 0.5 tablets (12.5 mg total) by mouth at bedtime. 04/24/22   Argentina Donovan, PA-C  melatonin 3 MG TABS tablet Take 3 mg by mouth at bedtime.    [provider]  nitroGLYCERIN (NITROSTAT) 0.4 MG SL tablet PLACE 1 TABLET (0.4 MG TOTAL) UNDER THE TONGUE EVERY 5 (FIVE) MINUTES AS NEEDED FOR CHEST PAIN. Patient taking differently: Place 0.4 mg under the tongue every 5 (five) minutes as needed for chest pain. 10/30/20 04/17/22  Donato Heinz, MD  nitroGLYCERIN (NITROSTAT) 0.4 MG SL tablet PLACE 1 TABLET UNDER THE TOUNGE EVERY 5 MINS  AS NEEDED FOR CHEST PAIN 04/22/22     oxyCODONE (OXY IR/ROXICODONE) 5 MG immediate release tablet TAKE 1 TABLET BY MOUTH EVERY 4HRS AS  NEEDED FOR PAIN (Scale 7-10) 04/22/22     pantoprazole (PROTONIX) 40 MG tablet TAKE 1 TABLET BY MOUTH ONCE A DAY AT BEDTIME 04/24/22   Freeman Caldron M, PA-C  rosuvastatin (CRESTOR) 40 MG tablet TAKE 1 TABLET BY MOUTH DAIL AT BEDTIME 04/22/22     senna-docusate (SENOKOT-S) 8.6-50 MG tablet Take 1 tablet by mouth at bedtime as needed for mild constipation or moderate constipation. 04/08/22   Pokhrel, Corrie Mckusick, MD  senna-docusate (STIMULANT LAXATIVE) 8.6-50 MG tablet take 1 tablet by mouth dily at bedtime as needed for constipation 04/22/22     lisinopril (ZESTRIL) 20 MG tablet Take 1 tablet (20 mg total) by mouth daily. 10/11/20 12/06/20  Donato Heinz, MD  omeprazole (PRILOSEC) 40 MG capsule Take 1 capsule (40 mg total) by mouth daily. 07/28/20 10/30/20  Hall-Potvin, [place], PA-C    ROS: Neg HEENT Neg resp Neg MS Neg psych   Objective:   Vitals:   04/24/22 1126  BP: 107/67  Pulse: (!) 58  SpO2: 99%   Exam General appearance : Awake, alert, not in any distress. Speech Clear. Not toxic looking.  In [place] today HEENT: Atraumatic and Normocephalic Neck: Supple, no JVD. No cervical lymphadenopathy.  Chest: Good air entry bilaterally, CTAB.  No rales/rhonchi/wheezing CVS: S1 S2 regular, no murmurs.  Extremities: B/L Lower Ext shows no edema, both legs are warm to touch.  L leg with limited ROM and L arm with 50% ROM Neurology: Awake alert, and oriented X 3, CN II-XII intact, Non focal.  No facial asymmetry Skin: No Rash  Data Review Lab Results  Component Value Date   HGBA1C 12.8 (H) 03/31/2022   HGBA1C 12.3 (H) 03/29/2022   HGBA1C 11.1 (A) 12/06/2020    Assessment & Plan   1. Type 2 diabetes mellitus with other circulatory complication, without long-term current use of insulin (HCC) Uncontrolled-only taking the jardiance that was given to her by the hospital but can't afford RF.  She can resume glipizide 28m bid with food and it appears that basaglar is on formulary.  I sent  her a new meter.  Check blood sugars fasting and at bedtime and record and see LLurena Joinerin 3  weeks for titration - Glucose (CBG) - Comprehensive metabolic panel - Insulin Glargine (BASAGLAR KWIKPEN) 100 UNIT/ML; Inject 14 Units into the skin in the morning and at bedtime.  Dispense: 15 mL; Refill: 3 - glipiZIDE (GLUCOTROL) 10 MG tablet; Take 1 tablet (10 mg total) by mouth 2 (two) times daily before a meal.  Dispense: 60 tablet; Refill: 3  2. Essential hypertension, benign -continue imdur - losartan (COZAAR) 25 MG tablet; Take 0.5 tablets (12.5 mg total) by mouth at bedtime.  Dispense: 30 tablet; Refill: 3 - carvedilol (COREG) 25 MG tablet; take 1 tablet by mouth twice a day with meals  Dispense: 60 tablet; Refill: 3 - clopidogrel (PLAVIX) 75 MG tablet; take 1 tablet by mouth daily  Dispense: 96.42 tablet; Refill: 3  3. NSTEMI (non-ST elevated myocardial infarction) (HCC) - isosorbide mononitrate (IMDUR) 30 MG 24 hr tablet; Take 1 tablet (30 mg total) by mouth at bedtime.  Dispense: 30 tablet; Refill: 3 - atorvastatin (LIPITOR) 80 MG tablet; Take 1 tablet (80 mg total) by mouth daily.  Dispense: 30 tablet; Refill: 3 - losartan (COZAAR) 25 MG tablet; Take 0.5 tablets (12.5 mg total) by mouth at bedtime.  Dispense: 30 tablet; Refill: 3 - carvedilol (COREG) 25 MG tablet; take 1 tablet by mouth twice a day with meals  Dispense: 60 tablet; Refill: 3 - Ambulatory referral to Physical Therapy  4. Anemia, unspecified type - Comprehensive metabolic panel - Ambulatory referral to Gastroenterology  5. Hospital discharge follow-up   6. Hematemesis, unspecified whether nausea present resolved - CBC with Differential/Platelet - Ambulatory referral to Gastroenterology - pantoprazole (PROTONIX) 40 MG tablet; TAKE 1 TABLET BY MOUTH ONCE A DAY AT BEDTIME  Dispense: 30 tablet; Refill: 3 - famotidine (PEPCID) 20 MG tablet; Take 1 tablet (20 mg total) by mouth 2 (two) times daily.  Dispense: 60 tablet; Refill:  2  7. Non compliance w medication regimen Compliance imperative.  Discussed this at length and patient verbalized understanding  8. CAD S/P percutaneous coronary angioplasty Followed by cardiology - Comprehensive metabolic panel  9. Cerebrovascular accident (CVA) due to embolism of cerebral artery (HCC)  - Comprehensive metabolic panel - Ambulatory referral to Neurology - clopidogrel (PLAVIX) 75 MG tablet; take 1 tablet by mouth daily  Dispense: 96.42 tablet; Refill: 3 - Ambulatory referral to Physical Therapy  10. Low back pain, unspecified back pain laterality, unspecified chronicity, unspecified whether sciatica present - lidocaine (LIDOCAINE PAIN RELIEF) 4 %; Place 1 patch onto the skin every morning.  Dispense: 30 patch; Refill: 3 - Ambulatory referral to Physical Therapy    Return for 3 weeks with Lurena Joiner and 3 months with PCP.  The patient was given clear instructions to go to ER or return to medical center if symptoms don't improve, worsen or new problems develop. The patient verbalized understanding. The patient was told to call to get lab results if they haven't heard anything in the next week.      Freeman Caldron, PA-C [facility] [place], [place]   04/24/2022, 12:08 PM

## 2022-04-24 ENCOUNTER — Encounter: Payer: Self-pay | Admitting: Physician Assistant

## 2022-04-24 ENCOUNTER — Ambulatory Visit: Payer: MEDICAID | Attending: Physician Assistant | Admitting: Physician Assistant

## 2022-04-24 ENCOUNTER — Other Ambulatory Visit: Payer: Self-pay

## 2022-04-24 VITALS — BP 107/67 | HR 58

## 2022-04-24 DIAGNOSIS — Z09 Encounter for follow-up examination after completed treatment for conditions other than malignant neoplasm: Secondary | ICD-10-CM

## 2022-04-24 DIAGNOSIS — I214 Non-ST elevation (NSTEMI) myocardial infarction: Secondary | ICD-10-CM

## 2022-04-24 DIAGNOSIS — D649 Anemia, unspecified: Secondary | ICD-10-CM

## 2022-04-24 DIAGNOSIS — E1159 Type 2 diabetes mellitus with other circulatory complications: Secondary | ICD-10-CM

## 2022-04-24 DIAGNOSIS — Z91148 Patient's other noncompliance with medication regimen for other reason: Secondary | ICD-10-CM

## 2022-04-24 DIAGNOSIS — K920 Hematemesis: Secondary | ICD-10-CM

## 2022-04-24 DIAGNOSIS — I2510 Atherosclerotic heart disease of native coronary artery without angina pectoris: Secondary | ICD-10-CM

## 2022-04-24 DIAGNOSIS — M545 Low back pain, unspecified: Secondary | ICD-10-CM

## 2022-04-24 DIAGNOSIS — I10 Essential (primary) hypertension: Secondary | ICD-10-CM

## 2022-04-24 DIAGNOSIS — I6340 Cerebral infarction due to embolism of unspecified cerebral artery: Secondary | ICD-10-CM

## 2022-04-24 DIAGNOSIS — Z9861 Coronary angioplasty status: Secondary | ICD-10-CM

## 2022-04-24 LAB — GLUCOSE, POCT (MANUAL RESULT ENTRY): POC Glucose: 180 mg/dl — AB (ref 70–99)

## 2022-04-24 MED ORDER — TRUEPLUS LANCETS 28G MISC
1.0000 | Freq: Two times a day (BID) | 5 refills | Status: DC
  Filled 2022-04-24 – 2022-12-02 (×3): qty 100, 50d supply, fill #0

## 2022-04-24 MED ORDER — CARVEDILOL 25 MG PO TABS
ORAL_TABLET | ORAL | 3 refills | Status: DC
  Filled 2022-04-24: qty 60, fill #0
  Filled 2022-05-17 – 2022-05-31 (×2): qty 60, 30d supply, fill #0

## 2022-04-24 MED ORDER — ISOSORBIDE MONONITRATE ER 30 MG PO TB24
30.0000 mg | ORAL_TABLET | Freq: Every day | ORAL | 3 refills | Status: DC
  Filled 2022-04-24: qty 30, 30d supply, fill #0

## 2022-04-24 MED ORDER — BASAGLAR KWIKPEN 100 UNIT/ML ~~LOC~~ SOPN
14.0000 [IU] | PEN_INJECTOR | Freq: Two times a day (BID) | SUBCUTANEOUS | 3 refills | Status: DC
  Filled 2022-04-24: qty 15, 54d supply, fill #0

## 2022-04-24 MED ORDER — PEN NEEDLES 31G X 5 MM MISC
1.0000 | Freq: Two times a day (BID) | 5 refills | Status: DC
  Filled 2022-04-24: qty 100, 50d supply, fill #0

## 2022-04-24 MED ORDER — ATORVASTATIN CALCIUM 80 MG PO TABS
80.0000 mg | ORAL_TABLET | Freq: Every day | ORAL | 3 refills | Status: DC
  Filled 2022-04-24: qty 30, 30d supply, fill #0
  Filled 2022-05-31: qty 30, 30d supply, fill #1

## 2022-04-24 MED ORDER — CITALOPRAM HYDROBROMIDE 20 MG PO TABS
ORAL_TABLET | ORAL | 3 refills | Status: DC
Start: 2022-04-24 — End: 2022-06-26
  Filled 2022-04-24: qty 30, fill #0
  Filled 2022-05-17: qty 30, 60d supply, fill #0
  Filled 2022-05-31: qty 15, 30d supply, fill #0

## 2022-04-24 MED ORDER — PANTOPRAZOLE SODIUM 40 MG PO TBEC
DELAYED_RELEASE_TABLET | ORAL | 3 refills | Status: DC
  Filled 2022-04-24: qty 30, fill #0
  Filled 2022-05-17 – 2022-05-31 (×2): qty 30, 30d supply, fill #0

## 2022-04-24 MED ORDER — TRUE METRIX METER W/DEVICE KIT
PACK | 0 refills | Status: DC
  Filled 2022-04-24: qty 1, 30d supply, fill #0

## 2022-04-24 MED ORDER — CLOPIDOGREL BISULFATE 75 MG PO TABS
ORAL_TABLET | ORAL | 3 refills | Status: DC
  Filled 2022-04-24: qty 96.42, fill #0
  Filled 2022-05-17: qty 30, 30d supply, fill #0
  Filled 2022-05-31: qty 90, 90d supply, fill #0

## 2022-04-24 MED ORDER — FAMOTIDINE 20 MG PO TABS
20.0000 mg | ORAL_TABLET | Freq: Two times a day (BID) | ORAL | 2 refills | Status: DC
  Filled 2022-04-24: qty 60, 30d supply, fill #0
  Filled 2022-05-31: qty 60, 30d supply, fill #1

## 2022-04-24 MED ORDER — GLIPIZIDE 10 MG PO TABS
10.0000 mg | ORAL_TABLET | Freq: Two times a day (BID) | ORAL | 3 refills | Status: DC
  Filled 2022-04-24: qty 60, 30d supply, fill #0
  Filled 2022-05-31: qty 60, 30d supply, fill #1

## 2022-04-24 MED ORDER — LIDOCAINE 4 % EX PTCH
MEDICATED_PATCH | CUTANEOUS | 3 refills | Status: DC
  Filled 2022-04-24: qty 30, 30d supply, fill #0
  Filled 2022-05-17: qty 30, fill #0

## 2022-04-24 MED ORDER — TRUE METRIX BLOOD GLUCOSE TEST VI STRP
ORAL_STRIP | 12 refills | Status: DC
  Filled 2022-04-24 – 2022-12-02 (×3): qty 100, 50d supply, fill #0

## 2022-04-24 MED ORDER — LOSARTAN POTASSIUM 25 MG PO TABS
12.5000 mg | ORAL_TABLET | Freq: Every day | ORAL | 3 refills | Status: DC
  Filled 2022-04-24: qty 30, 60d supply, fill #0
  Filled 2022-05-17 – 2022-05-31 (×2): qty 15, 30d supply, fill #0

## 2022-04-24 NOTE — Patient Instructions (Signed)
Check blood sugars fasting and bedtime and record and bring to next visit 

## 2022-04-25 LAB — CBC WITH DIFFERENTIAL/PLATELET
Basophils Absolute: 0.1 10*3/uL (ref 0.0–0.2)
Basos: 1 %
EOS (ABSOLUTE): 0.1 10*3/uL (ref 0.0–0.4)
Eos: 2 %
Hematocrit: 36 % (ref 34.0–46.6)
Hemoglobin: 12.2 g/dL (ref 11.1–15.9)
Immature Grans (Abs): 0 10*3/uL (ref 0.0–0.1)
Immature Granulocytes: 0 %
Lymphocytes Absolute: 1.9 10*3/uL (ref 0.7–3.1)
Lymphs: 34 %
MCH: 31.7 pg (ref 26.6–33.0)
MCHC: 33.9 g/dL (ref 31.5–35.7)
MCV: 94 fL (ref 79–97)
Monocytes Absolute: 0.3 10*3/uL (ref 0.1–0.9)
Monocytes: 6 %
Neutrophils Absolute: 3.2 10*3/uL (ref 1.4–7.0)
Neutrophils: 57 %
Platelets: 257 10*3/uL (ref 150–450)
RBC: 3.85 x10E6/uL (ref 3.77–5.28)
RDW: 12.5 % (ref 11.7–15.4)
WBC: 5.5 10*3/uL (ref 3.4–10.8)

## 2022-04-25 LAB — COMPREHENSIVE METABOLIC PANEL
ALT: 19 IU/L (ref 0–32)
AST: 16 IU/L (ref 0–40)
Albumin/Globulin Ratio: 1.9 (ref 1.2–2.2)
Albumin: 4.3 g/dL (ref 3.8–4.9)
Alkaline Phosphatase: 57 IU/L (ref 44–121)
BUN/Creatinine Ratio: 19 (ref 9–23)
BUN: 15 mg/dL (ref 6–24)
Bilirubin Total: <0.2 mg/dL (ref 0.0–1.2)
CO2: 25 mmol/L (ref 20–29)
Calcium: 9.2 mg/dL (ref 8.7–10.2)
Chloride: 108 mmol/L — ABNORMAL HIGH (ref 96–106)
Creatinine, Ser: 0.79 mg/dL (ref 0.57–1.00)
Globulin, Total: 2.3 g/dL (ref 1.5–4.5)
Glucose: 149 mg/dL — ABNORMAL HIGH (ref 70–99)
Potassium: 4.2 mmol/L (ref 3.5–5.2)
Sodium: 145 mmol/L — ABNORMAL HIGH (ref 134–144)
Total Protein: 6.6 g/dL (ref 6.0–8.5)
eGFR: 87 mL/min/{1.73_m2} (ref >59)

## 2022-04-28 ENCOUNTER — Telehealth: Payer: Self-pay | Admitting: Licensed Clinical Social Worker

## 2022-04-28 ENCOUNTER — Other Ambulatory Visit: Payer: Self-pay

## 2022-05-01 ENCOUNTER — Telehealth: Payer: Self-pay | Admitting: *Deleted

## 2022-05-01 DIAGNOSIS — I6340 Cerebral infarction due to embolism of unspecified cerebral artery: Secondary | ICD-10-CM

## 2022-05-01 NOTE — Telephone Encounter (Signed)
While giving results to lab test, patient request  To know if Dr. Alvis Lemmings would be able to fill forms out for disability. She states she was told this at her last OV.   Requesting DME: walker and wheelchair.

## 2022-05-05 ENCOUNTER — Other Ambulatory Visit: Payer: Self-pay

## 2022-05-07 ENCOUNTER — Ambulatory Visit: Payer: Self-pay

## 2022-05-07 ENCOUNTER — Telehealth: Payer: Self-pay | Admitting: Cardiology

## 2022-05-07 NOTE — Telephone Encounter (Signed)
[name], from [facility] called stating he called her PCP who informed them to call us or go to the emergency room.   Pt c/o of Chest Pain: STAT if CP now or developed within 24 hours  1. Are you having CP right now? no  2. Are you experiencing any other symptoms (ex. SOB, nausea, vomiting, sweating)? no  3. How long have you been experiencing CP? Since she came home from rehab, she has been having this pain since June 20th.   4. Is your CP continuous or coming and going? Comes and goes, last night was 20 mins, pain level at 10.   5. Have you taken Nitroglycerin? no ?

## 2022-05-07 NOTE — Telephone Encounter (Signed)
Roe Coombs, OT calling back to let me know pt called and spoke with nurse at Cardiolgy. They are ok with her waiting and coming to appt tomorrow at 1425 but if she has severe symptoms to go to ED today. Pt verbalized understanding.

## 2022-05-07 NOTE — Telephone Encounter (Signed)
Spoke with don, he reports the patient has been having a "zoom-zoom" pain from the left to the right side of her chest since she left the hospital. This morning she woke with a 10 out of 10 pain in her chest. After sitting up the pain radiated to her right side and went under her arm pit and became sharpe. He reports her bp is 160/80 and 168/78. She is pain free at present. They had contacted her medical doctor and they ask them to call us. Appointment scheduled for patient to be seen tomorrow and Patient voiced understanding to go to the ER if she has this pain again.

## 2022-05-07 NOTE — Telephone Encounter (Signed)
Noted  

## 2022-05-07 NOTE — Telephone Encounter (Signed)
  Chief Complaint: chest pain Symptoms: chest pain in center of chest, radiating into R armpit. Burning and sharp pain lasting 20 mins, pt had to sit on side of bed Frequency: last night Pertinent Negatives: Patient denies chets pain currently Disposition: [x] ED /[] Urgent Care (no appt availability in office) / [] Appointment(In office/virtual)/ []  [facility]/ [] Home Care/ [] Refused Recommended Disposition /[] [facility]/ []  Follow-up with PCP Additional Notes: Don, OT with [facility] calling to report pt's chest pain last night. She was lying in bed and pain was severe and intense, she had to sit up on edge of bed for 20 mins before pt eased off. Pt was afraid of another MI or blockage. Pts HR ranging 52-57 and BP was 160/80 on initial visit, Don rechecked it and was 168/78. I advised pt based on symptoms and elevated BP to go to ED for eval since no appts available and if she wanted to call cardiology and report she could as well. Pt verbalized understanding as well as , OT.   Reason for Disposition  [1] Chest pain lasts > 5 minutes AND [2] occurred > 3 days ago (72 hours) AND [3] NO chest pain or cardiac symptoms now  Answer Assessment - Initial Assessment Questions 1. LOCATION: "Where does it hurt?"       Chest pain  2. RADIATION: "Does the pain go anywhere else?" (e.g., into neck, jaw, arms, back)     R armpit  3. ONSET: "When did the chest pain begin?" (Minutes, hours or days)      Last night  4. PATTERN "Does the pain come and go, or has it been constant since it started?"  "Does it get worse with exertion?"      Comes and goes  5. DURATION: "How long does it last" (e.g., seconds, minutes, hours)     20 mins  6. SEVERITY: "How bad is the pain?"  (e.g., Scale 1-10; mild, moderate, or severe)    - MILD (1-3): doesn't interfere with normal activities     - MODERATE (4-7): interferes with normal activities or awakens from sleep    - SEVERE (8-10): excruciating  pain, unable to do any normal activities       10 7. CARDIAC RISK FACTORS: "Do you have any history of heart problems or risk factors for heart disease?" (e.g., angina, prior heart attack; diabetes, high blood pressure, high cholesterol, smoker, or strong family history of heart disease)     Heart attack  10. OTHER SYMPTOMS: "Do you have any other symptoms?" (e.g., dizziness, nausea, vomiting, sweating, fever, difficulty breathing, cough)       Burning and sharp pain was lying down and then sat up to alleviate the pain but was still severe, leg swelling  Protocols used: Chest Pain-A-AH

## 2022-05-08 ENCOUNTER — Other Ambulatory Visit: Payer: Self-pay

## 2022-05-08 ENCOUNTER — Ambulatory Visit (INDEPENDENT_AMBULATORY_CARE_PROVIDER_SITE_OTHER): Payer: 59 | Admitting: Physician Assistant

## 2022-05-08 ENCOUNTER — Other Ambulatory Visit (HOSPITAL_COMMUNITY): Payer: Self-pay

## 2022-05-08 DIAGNOSIS — R600 Localized edema: Secondary | ICD-10-CM

## 2022-05-08 DIAGNOSIS — I10 Essential (primary) hypertension: Secondary | ICD-10-CM | POA: Diagnosis not present

## 2022-05-08 DIAGNOSIS — Z9861 Coronary angioplasty status: Secondary | ICD-10-CM | POA: Diagnosis not present

## 2022-05-08 DIAGNOSIS — E785 Hyperlipidemia, unspecified: Secondary | ICD-10-CM

## 2022-05-08 DIAGNOSIS — E119 Type 2 diabetes mellitus without complications: Secondary | ICD-10-CM

## 2022-05-08 DIAGNOSIS — I2510 Atherosclerotic heart disease of native coronary artery without angina pectoris: Secondary | ICD-10-CM | POA: Diagnosis not present

## 2022-05-08 DIAGNOSIS — Z8673 Personal history of transient ischemic attack (TIA), and cerebral infarction without residual deficits: Secondary | ICD-10-CM

## 2022-05-08 MED ORDER — ISOSORBIDE MONONITRATE ER 60 MG PO TB24: 60.0000 mg | ORAL_TABLET | Freq: Every day | ORAL | 3 refills | Status: DC

## 2022-05-08 MED ORDER — ISOSORBIDE MONONITRATE ER 60 MG PO TB24
60.0000 mg | ORAL_TABLET | Freq: Every day | ORAL | 3 refills | Status: DC
  Filled 2022-05-08 – 2022-06-06 (×4): qty 90, 90d supply, fill #0

## 2022-05-08 NOTE — Patient Instructions (Signed)
Medication Instructions:  Increase Imdur 60 mg daily  *If you need a refill on your cardiac medications before your next appointment, please call your pharmacy*   Lab Work: NONE ordered at this time of appointment   If you have labs (blood work) drawn today and your tests are completely normal, you will receive your results only by: MyChart Message (if you have MyChart) OR A paper copy in the mail If you have any lab test that is abnormal or we need to change your treatment, we will call you to review the results.   Testing/Procedures: Your physician has requested that you have a lower or upper extremity venous duplex. This test is an ultrasound of the veins in the legs or arms. It looks at venous blood flow that carries blood from the heart to the legs or arms. Allow one hour for a Lower Venous exam. Allow thirty minutes for an Upper Venous exam. There are no restrictions or special instructions.     Follow-Up: At [facility], you and your health needs are our priority.  As part of our continuing mission to provide you with exceptional heart care, we have created designated Provider Care Teams.  These Care Teams include your primary Cardiologist (physician) and Advanced Practice Providers (APPs -  Physician Assistants and Nurse Practitioners) who all work together to provide you with the care you need, when you need it.  We recommend signing up for the patient portal called "MyChart".  Sign up information is provided on this After Visit Summary.  MyChart is used to connect with patients for Virtual Visits (Telemedicine).  Patients are able to view lab/test results, encounter notes, upcoming appointments, etc.  Non-urgent messages can be sent to your provider as well.   To learn more about what you can do with MyChart, go to [email or web address].    Your next appointment:   1-2 week(s)  The format for your next appointment:   In Person  Provider:   Azalee Course, PA-C         Other Instructions   Important Information About Sugar
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
## 2022-05-09 ENCOUNTER — Telehealth: Payer: Self-pay | Admitting: Family Medicine

## 2022-05-09 NOTE — Telephone Encounter (Signed)
Home Health Verbal Orders - Caller/Agency: Marvia Pickles Number: [phone]  Requesting OT/PT/Skilled Nursing/Social Work/Speech Therapy: OT ,ADL, IADL, transfers, exercise, pain control  Frequency: 1 x week for two weeks, skip a week, 1 x a week for two weeks

## 2022-05-12 NOTE — Telephone Encounter (Signed)
Verbal orders were given for the patient. 

## 2022-05-12 NOTE — Telephone Encounter (Signed)
Okay to give verbal order.

## 2022-05-14 ENCOUNTER — Ambulatory Visit: Payer: Medicaid Other

## 2022-05-15 ENCOUNTER — Telehealth: Payer: Self-pay | Admitting: Physician Assistant

## 2022-05-15 ENCOUNTER — Encounter (HOSPITAL_COMMUNITY): Payer: Self-pay

## 2022-05-15 ENCOUNTER — Ambulatory Visit (HOSPITAL_COMMUNITY)
Admission: RE | Admit: 2022-05-15 | Discharge: 2022-05-15 | Disposition: A | Discharge status: Home / Self Care | Payer: No Typology Code available for payment source | Source: Ambulatory Visit | Attending: Cardiology | Admitting: Cardiology

## 2022-05-15 ENCOUNTER — Telehealth: Payer: Self-pay | Admitting: Family Medicine

## 2022-05-15 ENCOUNTER — Other Ambulatory Visit: Payer: Self-pay

## 2022-05-15 DIAGNOSIS — R600 Localized edema: Secondary | ICD-10-CM | POA: Insufficient documentation

## 2022-05-15 MED ORDER — RIVAROXABAN (XARELTO) VTE STARTER PACK (15 & 20 MG)
ORAL_TABLET | ORAL | 0 refills | Status: DC
  Filled 2022-05-15: qty 51, 30d supply, fill #0

## 2022-05-15 NOTE — Telephone Encounter (Signed)
Home Health Verbal Orders - Caller/Agency: Blair Heys Number: [phone]   Requesting OT/PT/Skilled Nursing/Social Work/Speech Therapy: social work  Frequency: 1w1 0w1 1w1 starting 05-12-2022  Reason: assisting w/ community resource

## 2022-05-15 NOTE — Progress Notes (Signed)
Left lower extremity venous duplex completed. See preliminary results under Chart Review- CV proc. There is evidence of DVT in the left posterior tibial veins. Results given to [facility], PA-C. Patient instructed to pick up prescription at her pharmacy. Final report to follow.

## 2022-05-15 NOTE — Telephone Encounter (Signed)
Mediation has been sent to patient's pharmacy, Patient is aware of everything Azalee Course did speak with her about everything. She gave a verbal understanding.

## 2022-05-15 NOTE — Telephone Encounter (Signed)
Call placed and mailbox is currently full. ?

## 2022-05-15 NOTE — Telephone Encounter (Signed)
Discussed with vascular tech at [facility] office, patient presented for venous Doppler today and found to have a left posterior tibial DVT.  Will start on Xarelto DVT Dosepak followed by 20 mg daily thereafter.  [facility]  She need to continue on the Plavix given the recent stent placement.  She has upcoming follow-up with Edd Fabian on 05/26/2022.

## 2022-05-16 ENCOUNTER — Telehealth: Payer: Self-pay | Admitting: Family Medicine

## 2022-05-16 ENCOUNTER — Ambulatory Visit: Payer: Self-pay

## 2022-05-16 ENCOUNTER — Other Ambulatory Visit: Payer: Self-pay

## 2022-05-16 NOTE — Telephone Encounter (Signed)
Patient informed to reconnect with Dr.Mehta office who prescribe this medication to ask for a refill. Advised would need an apt with PCP.  It's not general practice of PCP to prescribe rx.   Could set up an apt to discuss.

## 2022-05-16 NOTE — Telephone Encounter (Signed)
Roger Shelter with [facility] given verbal orders for SW.

## 2022-05-16 NOTE — Telephone Encounter (Signed)
Agree with RN recommendation

## 2022-05-16 NOTE — Telephone Encounter (Signed)
      Chief Complaint: [name], OT, Frances [place], repots pt. Pulse is 70-64 at rest. No other symptoms other than mild headache. Pt. Asking for a refill on her Xanax.  Symptoms: Headache Frequency: Today Pertinent Negatives: Patient denies  Disposition: [] ED /[] Urgent Care (no appt availability in office) / [] Appointment(In office/virtual)/ []  [facility]/ [] Home Care/ [] Refused Recommended Disposition /[] [facility]/ [x]  Follow-up with PCP Additional Notes: Instructed to call 911 for SOB, chest pain, dizziness.  Answer Assessment - Initial Assessment Questions 1. DESCRIPTION: "Please describe your heart rate or heartbeat that you are having" (e.g., fast/slow, regular/irregular, skipped or extra beats, "palpitations")     HR 70-64 2. ONSET: "When did it start?" (Minutes, hours or days)      Today 3. DURATION: "How long does it last" (e.g., seconds, minutes, hours)     Today 4. PATTERN "Does it come and go, or has it been constant since it started?"  "Does it get worse with exertion?"   "Are you feeling it now?"     No symptoms 5. TAP: "Using your hand, can you tap out what you are feeling on a chair or table in front of you, so that I can hear?" (Note: not all patients can do this)       No 6. HEART RATE: "Can you tell me your heart rate?" "How many beats in 15 seconds?"  (Note: not all patients can do this)       70-64 7. RECURRENT SYMPTOM: "Have you ever had this before?" If Yes, ask: "When was the last time?" and "What happened that time?"      Yes 8. CAUSE: "What do you think is causing the palpitations?"     Unsure 9. CARDIAC HISTORY: "Do you have any history of heart disease?" (e.g., heart attack, angina, bypass surgery, angioplasty, arrhythmia)      No 10. OTHER SYMPTOMS: "Do you have any other symptoms?" (e.g., dizziness, chest pain, sweating, difficulty breathing)       Headache 11. PREGNANCY: "Is there any chance you are pregnant?" "When was your last menstrual  period?"       No  Protocols used: Heart Rate and Heartbeat Questions-A-AH

## 2022-05-16 NOTE — Telephone Encounter (Signed)
Christine calling from [facility]  Best contact: [phone]  Wants to know if the heart rate parameters are between 60-100 for PT

## 2022-05-16 NOTE — Telephone Encounter (Signed)
Yes, that is correct 

## 2022-05-17 ENCOUNTER — Other Ambulatory Visit: Payer: Self-pay | Admitting: Family Medicine

## 2022-05-17 ENCOUNTER — Other Ambulatory Visit: Payer: Self-pay

## 2022-05-19 ENCOUNTER — Other Ambulatory Visit: Payer: Self-pay

## 2022-05-19 ENCOUNTER — Telehealth: Payer: Self-pay | Admitting: Physician Assistant

## 2022-05-19 NOTE — Telephone Encounter (Signed)
New Message:    Roe Coombs from [place] [facility] called. He wanted to report a level 2 interaction  between Xarelto and Clopidogrel.

## 2022-05-19 NOTE — Telephone Encounter (Signed)
Discussed with vascular tech at [facility] office, patient presented for venous Doppler today and found to have a left posterior tibial DVT.  Will start on Xarelto DVT Dosepak followed by 20 mg daily thereafter.  [facility]  She need to continue on the Plavix given the recent stent placement.  She has upcoming follow-up with Edd Fabian on 05/26/2022.   Spoke with Roe Coombs, aware patient should be on both Plavix and Xarelto.

## 2022-05-20 ENCOUNTER — Other Ambulatory Visit: Payer: Self-pay

## 2022-05-20 MED ORDER — MELATONIN 3 MG PO TABS
3.0000 mg | ORAL_TABLET | Freq: Every day | ORAL | 3 refills | Status: DC
  Filled 2022-05-20: qty 30, 30d supply, fill #0

## 2022-05-21 ENCOUNTER — Other Ambulatory Visit: Payer: Self-pay
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
## 2022-05-26 ENCOUNTER — Ambulatory Visit
Admission: EM | Admit: 2022-05-26 | Discharge: 2022-05-26 | Disposition: A | Discharge status: Home / Self Care | Payer: No Typology Code available for payment source | Attending: Emergency Medicine | Admitting: Emergency Medicine

## 2022-05-26 ENCOUNTER — Encounter: Payer: Self-pay | Admitting: General Practice

## 2022-05-26 ENCOUNTER — Ambulatory Visit (INDEPENDENT_AMBULATORY_CARE_PROVIDER_SITE_OTHER): Payer: No Typology Code available for payment source | Admitting: General Practice

## 2022-05-26 VITALS — BP 128/76 | HR 66 | Ht 67.0 in | Wt 208.0 lb

## 2022-05-26 DIAGNOSIS — E119 Type 2 diabetes mellitus without complications: Secondary | ICD-10-CM

## 2022-05-26 DIAGNOSIS — Z8673 Personal history of transient ischemic attack (TIA), and cerebral infarction without residual deficits: Secondary | ICD-10-CM | POA: Diagnosis not present

## 2022-05-26 DIAGNOSIS — Z9861 Coronary angioplasty status: Secondary | ICD-10-CM

## 2022-05-26 DIAGNOSIS — Z20822 Contact with and (suspected) exposure to covid-19: Secondary | ICD-10-CM

## 2022-05-26 DIAGNOSIS — I82401 Acute embolism and thrombosis of unspecified deep veins of right lower extremity: Secondary | ICD-10-CM

## 2022-05-26 DIAGNOSIS — I2510 Atherosclerotic heart disease of native coronary artery without angina pectoris: Secondary | ICD-10-CM

## 2022-05-26 DIAGNOSIS — I10 Essential (primary) hypertension: Secondary | ICD-10-CM | POA: Diagnosis not present

## 2022-05-26 NOTE — Discharge Instructions (Addendum)
You were tested for COVID-19 today.  The result of your COVID-19 test will be posted to your MyChart once it is complete, typically this takes 36 to 48 hours.    If your COVID-19 test is positive, you will be contacted by phone.  Please discontinue Tamiflu.  Please advise the nurse whether you are interested in taking the recommended antiviral for COVID-19 called Paxlovid.   Please quarantine at home until you receive the results of your COVID-19 test along with further instructions for self-care at home.  Thank you for visiting urgent care today.  We appreciate the opportunity to participate in your care.

## 2022-05-26 NOTE — Patient Instructions (Signed)
Medication Instructions:  The current medical regimen is effective;  continue present plan and medications as directed. Please refer to the Current Medication list given to you today.   *If you need a refill on your cardiac medications before your next appointment, please call your pharmacy*  Lab Work:   Testing/Procedures:  FASTING LIPID/LFT  NONE  If you have labs (blood work) drawn today and your tests are completely normal, you will receive your results only by: MyChart Message (if you have MyChart) OR  A paper copy in the mail If you have any lab test that is abnormal or we need to change your treatment, we will call you to review the results.  You may also go to any of these [facility] locations:   [facility] [phone] [facility] [address] ([facility])   [address]   [phone] [address] [place]   [address]  [place] [address]   [address]   [address] [employer] [address] [place]  [address] [employer])  Special Instructions PLEASE READ AND FOLLOW SALTY 6-ATTACHED-1,800mg  daily  PLEASE INCREASE PHYSICAL ACTIVITY AS TOLERATED   PLEASE CONTACT PRIMARY CARE MD TO DISCUSS LEFT SIDE NERVE PAIN  Follow-Up: Your next appointment:  10/07/2022 at 3:00 PM  In Person with Parrillo Ishikawa, MD    At [facility], you and your health needs are our priority.  As part of our continuing mission to provide you with exceptional heart care, we have created designated Provider Care Teams.  These Care Teams include your primary Cardiologist (physician) and Advanced Practice Providers (APPs -  Physician Assistants and Nurse Practitioners) who all work together to provide you with the care you need, when you need it.  Important Information About Sugar             6 SALTY THINGS TO AVOID     1,800MG  DAILY
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
## 2022-05-26 NOTE — ED Triage Notes (Signed)
The patient states her daughter was diagnosed with Covid and now she is feeling nauseated, and is having headaches.   Started: Sunday

## 2022-05-27 ENCOUNTER — Ambulatory Visit: Payer: Self-pay

## 2022-05-27 ENCOUNTER — Other Ambulatory Visit: Payer: Self-pay

## 2022-05-27 LAB — NOVEL CORONAVIRUS, NAA: SARS-CoV-2, NAA: NOT DETECTED

## 2022-05-27 NOTE — Telephone Encounter (Signed)
     Chief Complaint: OT from [facility] with pt. And reports High blood pressure  178/80. Has back pain as well. No other symptoms. States she has neuropathy per cardiology. Asking for medication to be sent in.  Symptoms:Above Frequency: Today Pertinent Negatives: Patient denies headache, blurred vision Disposition: [] ED /[] Urgent Care (no appt availability in office) / [] Appointment(In office/virtual)/ []  [place] [facility]/ [] Home Care/ [] Refused Recommended Disposition /[] [place] [facility]/ [x]  Follow-up with PCP Additional Notes: Instructed to call 911 for worsening of symptoms. Please advise pt. And OT if any orders for him.  Answer Assessment - Initial Assessment Questions 1. BLOOD PRESSURE: "What is the blood pressure?" "Did you take at least two measurements 5 minutes apart?"     178/80 2. ONSET: "When did you take your blood pressure?"     Today 3. HOW: "How did you take your blood pressure?" (e.g., automatic home BP monitor, visiting nurse)     OT 4. HISTORY: "Do you have a history of high blood pressure?"     yES 5. MEDICINES: "Are you taking any medicines for blood pressure?" "Have you missed any doses recently?"     Yes 6. OTHER SYMPTOMS: "Do you have any symptoms?" (e.g., blurred vision, chest pain, difficulty breathing, headache, weakness)     Back pain 7. PREGNANCY: "Is there any chance you are pregnant?" "When was your last menstrual period?"     No  Protocols used: Blood Pressure - High-A-AH

## 2022-05-29 NOTE — Telephone Encounter (Signed)
Unable to check BP at home. She does not have a BP monitor. She states she does not feel bad.   During the call, she states she requested from [facility], her PCP to send medication for nerve pain in legs. She states she was advised to do so by Dr. Donnamarie Rossetti office.   If applicable, pls send to [facility].

## 2022-05-29 NOTE — Telephone Encounter (Signed)
I have not seen her since 12/2020. Please advise to schedule a visit. Thanks

## 2022-05-29 NOTE — Telephone Encounter (Signed)
Left message on voicemail to return call.

## 2022-05-30 ENCOUNTER — Ambulatory Visit: Payer: Self-pay

## 2022-05-30 NOTE — Telephone Encounter (Signed)
Left message on voicemail to return call.  Needs to schedule apt per Dr. Alvis Lemmings.

## 2022-05-30 NOTE — Telephone Encounter (Signed)
  Chief Complaint: Chest pain,  Symptoms: left arm pain numbness, left arm heavy, SOB. Frequency: since yesterday Pertinent Negatives: Patient denies  Disposition: [x] ED /[] Urgent Care (no appt availability in office) / [] Appointment(In office/virtual)/ []  [place] [facility]/ [] Home Care/ [] Refused Recommended Disposition /[] [place] [facility]/ []  Follow-up with PCP Additional Notes: Pt has had chest pain and left arm pain and numbness, intermittently since yesterday. Pain is lasting 5 seconds. Pt also states that it takes her 25 minutes to recover from the trip from her bed to the bedside commode. Pt refuses to go to ED. Sent Teams message to [facility]. PT also states that Rx for pain medicine has not been filled from cardiologist.  Reason for Disposition  SEVERE chest pain  Answer Assessment - Initial Assessment Questions 1. LOCATION: "Where does it hurt?"       Middle of chest 2. RADIATION: "Does the pain go anywhere else?" (e.g., into neck, jaw, arms, back)     Goes to left heavy, wrist numb 3. ONSET: "When did the chest pain begin?" (Minutes, hours or days)      Yesterday 4. PATTERN: "Does the pain come and go, or has it been constant since it started?"  "Does it get worse with exertion?"      Comes and goes 5. DURATION: "How long does it last" (e.g., seconds, minutes, hours)     5 seconds 6. SEVERITY: "How bad is the pain?"  (e.g., Scale 1-10; mild, moderate, or severe)    - MILD (1-3): doesn't interfere with normal activities     - MODERATE (4-7): interferes with normal activities or awakens from sleep    - SEVERE (8-10): excruciating pain, unable to do any normal activities       8/10 7. CARDIAC RISK FACTORS: "Do you have any history of heart problems or risk factors for heart disease?" (e.g., angina, prior heart attack; diabetes, high blood pressure, high cholesterol, smoker, or strong family history of heart disease)     MI , stroke 8. PULMONARY RISK FACTORS: "Do you have  any history of lung disease?"  (e.g., blood clots in lung, asthma, emphysema, birth control pills)     *No Answer* 9. CAUSE: "What do you think is causing the chest pain?"     *No Answer* 10. OTHER SYMPTOMS: "Do you have any other symptoms?" (e.g., dizziness, nausea, vomiting, sweating, fever, difficulty breathing, cough)       Feels weak 11. PREGNANCY: "Is there any chance you are pregnant?" "When was your last menstrual period?"       na  Protocols used: Chest Pain-A-AH

## 2022-05-30 NOTE — Telephone Encounter (Signed)
She needs to go to the ED

## 2022-05-31 ENCOUNTER — Other Ambulatory Visit: Payer: Self-pay

## 2022-06-02 ENCOUNTER — Ambulatory Visit: Payer: 59 | Admitting: Pharmacist

## 2022-06-02 ENCOUNTER — Other Ambulatory Visit: Payer: Self-pay

## 2022-06-02 NOTE — Progress Notes (Unsigned)
S:     No chief complaint on file.  [name] is a 56 y.o. female who presents for diabetes evaluation, education, and management.  PMH is significant for ***.  Patient was referred and last seen by Primary Care Provider, Dr. ***, on ***.  *** Patient was referred by *** on ***. Patient was last seen by Primary Care Provider, Dr. ***, on ***.  At last visit, ***.   Today, patient arrives in *** good spirits and presents without *** any assistance. ***  Patient reports Diabetes was diagnosed in ***.   Family/Social History: ***  Current diabetes medications include: *** Current hypertension medications include: *** Current hyperlipidemia medications include: ***  Patient reports adherence to taking all medications as prescribed.  *** Patient denies adherence with medications, reports missing *** medications *** times per week, on average.  Do you feel that your medications are working for you? {YES NO:22349} Have you been experiencing any side effects to the medications prescribed? {YES NO:22349} Do you have any problems obtaining medications due to transportation or finances? {YES [id number] Insurance coverage: ***  Patient {Actions; denies-reports:120008} hypoglycemic events.  Reported home fasting blood sugars: ***  Reported 2 hour post-meal/random blood sugars: ***.  Patient {Actions; denies-reports:120008} nocturia (nighttime urination).  Patient {Actions; denies-reports:120008} neuropathy (nerve pain). Patient {Actions; denies-reports:120008} visual changes. Patient {Actions; denies-reports:120008} self foot exams.   Patient reported dietary habits: Eats *** meals/day Breakfast: *** Lunch: *** Dinner: *** Snacks: *** Drinks: ***  Within the past 12 months, did you worry whether your food would run out before you got money to buy more? {YES NO:22349} Within the past 12 months, did the food you bought run out, and you didn't have money to get more? {YES  NO:22349} PHQ-9 Score: ***  Patient-reported exercise habits: ***   O:   ROS  Physical Exam  7 day average blood glucose: ***  *** CGM Download:  % Time CGM is active: ***% Average Glucose: *** mg/dL Glucose Management Indicator: ***  Glucose Variability: *** (goal <36%) Time in Goal:  - Time in range 70-180: ***% - Time above range: ***% - Time below range: ***% Observed patterns:   Lab Results  Component Value Date   HGBA1C 12.8 (H) 03/31/2022   There were no vitals filed for this visit.  Lipid Panel     Component Value Date/Time   CHOL 254 (H) 03/31/2022 0301   CHOL 137 10/30/2020 1649   TRIG 194 (H) 03/31/2022 0301   HDL 42 03/31/2022 0301   HDL 47 10/30/2020 1649   CHOLHDL 6.0 03/31/2022 0301   VLDL 39 03/31/2022 0301   LDLCALC 173 (H) 03/31/2022 0301   LDLCALC 52 10/30/2020 1649    Clinical Atherosclerotic Cardiovascular Disease (ASCVD): {YES/NO:21197} The ASCVD Risk score (Arnett DK, et al., 2019) failed to calculate for the following reasons:   The patient has a prior MI or stroke diagnosis   Patient is participating in a Managed Medicaid Plan:  {MM YES/NO:27447::"Yes"}   A/P: Diabetes longstanding *** currently ***. Patient is *** able to verbalize appropriate hypoglycemia management plan. Medication adherence appears ***. Control is suboptimal due to ***. -{Meds adjust:18428} basal insulin *** (insulin ***). Patient will continue to titrate 1 unit every *** days if fasting blood sugar > 100mg /dl until fasting blood sugars reach goal or next visit.  -{Meds adjust:18428} rapid insulin *** (insulin ***) to ***.  -{Meds adjust:18428} GLP-1 *** (generic ***) to ***.  -{Meds adjust:18428} SGLT2-I *** (generic ***)  to ***. Counseled on sick day rules. -{Meds adjust:18428} metformin *** to ***.  -Patient educated on purpose, proper use, and potential adverse effects of ***.  -Extensively discussed pathophysiology of diabetes, recommended lifestyle  interventions, dietary effects on blood sugar control.  -Counseled on s/sx of and management of hypoglycemia.  -Next A1c anticipated ***.   ASCVD risk - primary ***secondary prevention in patient with diabetes. Last LDL is *** not at goal of <69 *** mg/dL. ASCVD risk factors include *** and 10-year ASCVD risk score of ***. {Desc; low/moderate/high:110033} intensity statin indicated.  -{Meds adjust:18428} ***statin *** mg.   Hypertension longstanding *** currently ***. Blood pressure goal of <130/80 *** mmHg. Medication adherence ***. Blood pressure control is suboptimal due to ***. -***  Written patient instructions provided. Patient verbalized understanding of treatment plan.  Total time in face to face counseling *** minutes.    Follow-up:  Pharmacist ***. PCP clinic visit in ***.  Patient seen with ***.

## 2022-06-04 NOTE — Telephone Encounter (Signed)
Was able to connect with patient. States she had an apt with Cardiologist on last Monday. He told her she needs medication for nerve pain.She will follow up with [employer] if able. Made an apt to f/u with PCP as well.

## 2022-06-07 ENCOUNTER — Other Ambulatory Visit (HOSPITAL_COMMUNITY): Payer: Self-pay

## 2022-06-07 ENCOUNTER — Other Ambulatory Visit: Payer: Self-pay

## 2022-06-09 ENCOUNTER — Other Ambulatory Visit: Payer: Self-pay

## 2022-06-13 ENCOUNTER — Other Ambulatory Visit: Payer: Self-pay

## 2022-06-16 ENCOUNTER — Emergency Department (HOSPITAL_COMMUNITY): Payer: No Typology Code available for payment source

## 2022-06-16 ENCOUNTER — Encounter (HOSPITAL_COMMUNITY): Payer: Self-pay

## 2022-06-16 ENCOUNTER — Emergency Department (HOSPITAL_COMMUNITY)
Admission: EM | Admit: 2022-06-16 | Discharge: 2022-06-16 | Disposition: A | Discharge status: Home / Self Care | Payer: No Typology Code available for payment source | Attending: Emergency Medicine | Admitting: Emergency Medicine

## 2022-06-16 ENCOUNTER — Other Ambulatory Visit: Payer: Self-pay

## 2022-06-16 DIAGNOSIS — Z7902 Long term (current) use of antithrombotics/antiplatelets: Secondary | ICD-10-CM | POA: Diagnosis not present

## 2022-06-16 DIAGNOSIS — R8281 Pyuria: Secondary | ICD-10-CM

## 2022-06-16 DIAGNOSIS — R310 Gross hematuria: Secondary | ICD-10-CM | POA: Diagnosis not present

## 2022-06-16 DIAGNOSIS — R600 Localized edema: Secondary | ICD-10-CM | POA: Diagnosis not present

## 2022-06-16 DIAGNOSIS — I10 Essential (primary) hypertension: Secondary | ICD-10-CM | POA: Diagnosis not present

## 2022-06-16 DIAGNOSIS — Z7901 Long term (current) use of anticoagulants: Secondary | ICD-10-CM | POA: Insufficient documentation

## 2022-06-16 DIAGNOSIS — E119 Type 2 diabetes mellitus without complications: Secondary | ICD-10-CM | POA: Diagnosis not present

## 2022-06-16 DIAGNOSIS — R0789 Other chest pain: Secondary | ICD-10-CM

## 2022-06-16 DIAGNOSIS — Z794 Long term (current) use of insulin: Secondary | ICD-10-CM | POA: Diagnosis not present

## 2022-06-16 DIAGNOSIS — Z79899 Other long term (current) drug therapy: Secondary | ICD-10-CM | POA: Insufficient documentation

## 2022-06-16 DIAGNOSIS — Z7984 Long term (current) use of oral hypoglycemic drugs: Secondary | ICD-10-CM | POA: Insufficient documentation

## 2022-06-16 LAB — BASIC METABOLIC PANEL
Anion gap: 9 (ref 5–15)
BUN: 14 mg/dL (ref 6–20)
CO2: 24 mmol/L (ref 22–32)
Calcium: 8.8 mg/dL — ABNORMAL LOW (ref 8.9–10.3)
Chloride: 106 mmol/L (ref 98–111)
Creatinine, Ser: 0.79 mg/dL (ref 0.44–1.00)
GFR, Estimated: >60 mL/min (ref >60)
Glucose, Bld: 344 mg/dL — ABNORMAL HIGH (ref 70–99)
Potassium: 3.8 mmol/L (ref 3.5–5.1)
Sodium: 139 mmol/L (ref 135–145)

## 2022-06-16 LAB — URINALYSIS, ROUTINE W REFLEX MICROSCOPIC
Bacteria, UA: NONE SEEN
Bilirubin Urine: NEGATIVE
Glucose, UA: >=500 mg/dL — AB
Ketones, ur: NEGATIVE mg/dL
Nitrite: NEGATIVE
Protein, ur: NEGATIVE mg/dL
RBC / HPF: >50 RBC/hpf — ABNORMAL HIGH (ref 0–5)
Specific Gravity, Urine: 1.023 (ref 1.005–1.030)
pH: 5 (ref 5.0–8.0)

## 2022-06-16 LAB — CBC WITH DIFFERENTIAL/PLATELET
Abs Immature Granulocytes: 0.01 10*3/uL (ref 0.00–0.07)
Basophils Absolute: 0.1 10*3/uL (ref 0.0–0.1)
Basophils Relative: 1 %
Eosinophils Absolute: 0.1 10*3/uL (ref 0.0–0.5)
Eosinophils Relative: 2 %
HCT: 36.9 % (ref 36.0–46.0)
Hemoglobin: 12.3 g/dL (ref 12.0–15.0)
Immature Granulocytes: 0 %
Lymphocytes Relative: 33 %
Lymphs Abs: 2.1 10*3/uL (ref 0.7–4.0)
MCH: 31.4 pg (ref 26.0–34.0)
MCHC: 33.3 g/dL (ref 30.0–36.0)
MCV: 94.1 fL (ref 80.0–100.0)
Monocytes Absolute: 0.4 10*3/uL (ref 0.1–1.0)
Monocytes Relative: 6 %
Neutro Abs: 3.7 10*3/uL (ref 1.7–7.7)
Neutrophils Relative %: 58 %
Platelets: 215 10*3/uL (ref 150–400)
RBC: 3.92 MIL/uL (ref 3.87–5.11)
RDW: 12.9 % (ref 11.5–15.5)
WBC: 6.3 10*3/uL (ref 4.0–10.5)
nRBC: 0 % (ref 0.0–0.2)

## 2022-06-16 LAB — TROPONIN I (HIGH SENSITIVITY)
Troponin I (High Sensitivity): 10 ng/L (ref <18)
Troponin I (High Sensitivity): 14 ng/L (ref <18)

## 2022-06-16 MED ORDER — RIVAROXABAN 10 MG PO TABS
20.0000 mg | ORAL_TABLET | Freq: Every day | ORAL | Status: DC
Start: 2022-06-16 — End: 2022-06-16
  Administered 2022-06-16: 20 mg via ORAL
  Filled 2022-06-16: qty 2

## 2022-06-16 MED ORDER — ALUM & MAG HYDROXIDE-SIMETH 200-200-20 MG/5ML PO SUSP
30.0000 mL | Freq: Once | ORAL | Status: AC
  Administered 2022-06-16: 30 mL via ORAL
  Filled 2022-06-16: qty 30

## 2022-06-16 MED ORDER — LOSARTAN POTASSIUM 50 MG PO TABS
25.0000 mg | ORAL_TABLET | Freq: Once | ORAL | Status: AC
Start: 2022-06-16 — End: 2022-06-16
  Administered 2022-06-16: 25 mg via ORAL
  Filled 2022-06-16: qty 1

## 2022-06-16 MED ORDER — CARVEDILOL 12.5 MG PO TABS
25.0000 mg | ORAL_TABLET | Freq: Once | ORAL | Status: AC
  Administered 2022-06-16: 25 mg via ORAL
  Filled 2022-06-16: qty 2

## 2022-06-16 MED ORDER — CEFDINIR 300 MG PO CAPS
300.0000 mg | ORAL_CAPSULE | Freq: Two times a day (BID) | ORAL | Status: DC
  Administered 2022-06-16: 300 mg via ORAL
  Filled 2022-06-16: qty 1

## 2022-06-16 MED ORDER — CLOPIDOGREL BISULFATE 75 MG PO TABS
75.0000 mg | ORAL_TABLET | Freq: Once | ORAL | Status: AC
  Administered 2022-06-16: 75 mg via ORAL
  Filled 2022-06-16: qty 1

## 2022-06-16 MED ORDER — ISOSORBIDE MONONITRATE ER 30 MG PO TB24
60.0000 mg | ORAL_TABLET | Freq: Once | ORAL | Status: AC
Start: 2022-06-16 — End: 2022-06-16
  Administered 2022-06-16: 60 mg via ORAL
  Filled 2022-06-16: qty 2

## 2022-06-16 MED ORDER — FAMOTIDINE 20 MG PO TABS
40.0000 mg | ORAL_TABLET | Freq: Once | ORAL | Status: AC
  Administered 2022-06-16: 40 mg via ORAL
  Filled 2022-06-16: qty 2

## 2022-06-16 MED ORDER — CEPHALEXIN 250 MG PO CAPS: 250.0000 mg | ORAL_CAPSULE | Freq: Four times a day (QID) | ORAL | 0 refills | Status: AC

## 2022-06-16 NOTE — ED Provider Notes (Signed)
I saw and evaluated the patient, reviewed the resident's note and I agree with the findings and plan.  EKG Interpretation  Date/Time:  Monday June 16 2022 02:46:24 EDT Ventricular Rate:  63 PR Interval:  194 QRS Duration: 104 QT Interval:  404 QTC Calculation: 413 R Axis:   -34 Text Interpretation: Normal sinus rhythm Left axis deviation Moderate voltage criteria for LVH, may be normal variant ( R in aVL , Cornell product ) Septal infarct , age undetermined Abnormal ECG When compared with ECG of 03-Apr-2022 03:37, PREVIOUS ECG IS PRESENT No significant change since last tracing Confirmed by Lorre Nick ([id number]) on 06/16/2022 7:44:33 AM   56 year old female presents with 12 hours burning to her epigastric area as well as hematuria.  No anginal type quality to it.  EKG per my interpretation shows no acute findings.  Patient's troponins x2 are negative.  Her urinalysis does show hematuria but she is on Xarelto and Plavix.  Questionable UTI due to increased white blood cells.  Plan will be to treat patient for likely GERD and for her UTI.   Lorre Nick, MD 06/16/22 [phone]

## 2022-06-16 NOTE — ED Provider Triage Note (Addendum)
Emergency Medicine Provider Triage Evaluation Note  [name] , a 56 y.o. female  was evaluated in triage.  Pt complains of chest pain.  Bloody urine all day today.  CP began 3 hours ago, worsening.  Reports crushing sensation central chest.  On xarelto and plavix.  Pain improved some after 1 SL NTG.  No ASA given.  Review of Systems  Positive: Chest pain, hematuria Negative: fever  Physical Exam  BP (!) 200/85   Pulse 66   Temp 98.7 F (37.1 C) (Oral)   Resp 16   Ht 5\' 7"  (1.702 m)   Wt 94.3 kg   LMP 01/24/2015 (Approximate)   SpO2 97%   BMI 32.56 kg/m   Gen:   Awake, no distress   Resp:  Normal effort  MSK:   Moves extremities without difficulty  Other:    Medical Decision Making  Medically screening exam initiated at 2:36 AM.  Appropriate orders placed.  Chikita Dogan Israelson was informed that the remainder of the evaluation will be completed by another provider, this initial triage assessment does not replace that evaluation, and the importance of remaining in the ED until their evaluation is complete.  Chest pain, hematuria.  CP improved after SL NTG.  On plavix and xarelto.  EKG, labs, CXR.   Arlana Pouch, PA-C 06/16/22 0241    06/18/22, PA-C 06/16/22 [phone]

## 2022-06-16 NOTE — ED Provider Notes (Signed)
[facility] EMERGENCY DEPARTMENT Provider Note   CSN: [id number] Arrival date & time: 06/16/22  0234     History  Chief Complaint  Patient presents with   Chest Pain    [name] is a 56 y.o. female with a history of ACS status post stents, DVT, taking Xarelto and Plavix, who presents with 8 hours of burning chest pain.  Pain started suddenly last night around 12:30 AM, described as a central burning, nonradiating, better when she lies down flat.  Does not feel like her prior ACS.  Not associated with nausea, shortness of breath, cough.  Does not increase on breathing.  Also complains of blood in urine.  Started yesterday around 11 AM.  It is painless.  She has no burning on urination or changes to urinary frequency.  She denies fevers, chills.  She has no flank pain.  Medical history: ACS Diabetes DVT   Chest Pain      Home Medications Prior to Admission medications   Medication Sig Start Date End Date Taking? Authorizing Provider  cephALEXin (KEFLEX) 250 MG capsule Take 1 capsule (250 mg total) by mouth 4 (four) times daily for 5 days. 06/16/22 06/21/22 Yes Nani Gasser, MD  acetaminophen (TYLENOL) 500 MG tablet Take 1 tablet (500 mg total) by mouth every 6 (six) hours as needed. Patient taking differently: Take 500 mg by mouth every 6 (six) hours as needed for moderate pain or headache. 06/16/20   Darr, Edison Nasuti, PA-C  albuterol (VENTOLIN HFA) 108 (90 Base) MCG/ACT inhaler Inhale 2 puffs by mouth every 6hrs as needed for shortness of breath and wheezing 04/22/22     ALPRAZolam (XANAX) 0.25 MG tablet take 1 tablet by mouth every 6hrs as needed for anxiety 04/22/22     atorvastatin (LIPITOR) 80 MG tablet Take 1 tablet (80 mg total) by mouth daily. 04/24/22   Argentina Donovan, PA-C  baclofen (LIORESAL) 10 MG tablet take 1 tablet by mouth three times a day 04/22/22     Blood Glucose Monitoring Suppl (TRUE METRIX METER) w/Device KIT Check blood sugar BID.  04/24/22   McClung, Dionne Bucy, PA-C  carvedilol (COREG) 25 MG tablet take 1 tablet by mouth twice a day with meals 04/24/22   Freeman Caldron M, PA-C  citalopram (CELEXA) 20 MG tablet take 1/2 tablet by mouth once a day 04/24/22   Argentina Donovan, PA-C  clopidogrel (PLAVIX) 75 MG tablet take 1 tablet by mouth daily 04/24/22   Argentina Donovan, PA-C  famotidine (PEPCID) 20 MG tablet Take 1 tablet (20 mg total) by mouth 2 (two) times daily. 04/24/22   Argentina Donovan, PA-C  glipiZIDE (GLUCOTROL) 10 MG tablet Take 1 tablet (10 mg total) by mouth 2 (two) times daily before a meal. 04/24/22   McClung, Dionne Bucy, PA-C  glucose blood (TRUE METRIX BLOOD GLUCOSE TEST) test strip Use as instructed 04/24/22   Argentina Donovan, PA-C  Insulin Glargine (BASAGLAR KWIKPEN) 100 UNIT/ML Inject 14 Units into the skin in the morning and at bedtime. 04/24/22   Argentina Donovan, PA-C  Insulin Pen Needle (PEN NEEDLES) 31G X 5 MM MISC Use as directed 2 (two) times daily. 04/24/22   Argentina Donovan, PA-C  isosorbide mononitrate (IMDUR) 60 MG 24 hr tablet Take 1 tablet (60 mg total) by mouth daily. 05/08/22   Almyra Deforest, PA  losartan (COZAAR) 25 MG tablet Take 1/2 tablet by mouth at bedtime. 04/24/22   Argentina Donovan, PA-C  nitroGLYCERIN (NITROSTAT) 0.4 MG SL tablet PLACE 1 TABLET UNDER THE TOUNGE EVERY 5 MINS  AS NEEDED FOR CHEST PAIN 04/22/22     oxyCODONE (OXY IR/ROXICODONE) 5 MG immediate release tablet TAKE 1 TABLET BY MOUTH EVERY 4HRS AS NEEDED FOR PAIN (Scale 7-10) 04/22/22     pantoprazole (PROTONIX) 40 MG tablet TAKE 1 TABLET BY MOUTH ONCE A DAY AT BEDTIME 04/24/22   McClung, Dionne Bucy, PA-C  RIVAROXABAN [employer]) VTE STARTER PACK (15 & 20 MG) Follow package directions: Take one 53m tablet by mouth twice a day. On day 22, switch to one 256mtablet once a day. Take with food. 05/15/22   MeAlmyra DeforestPA  senna-docusate (STIMULANT LAXATIVE) 8.6-50 MG tablet take 1 tablet by mouth dily at bedtime as needed for constipation 04/22/22      TRUEplus Lancets 28G MISC 1 each by Does not apply route 2 (two) times daily. 04/24/22   McArgentina DonovanPA-C  lisinopril (ZESTRIL) 20 MG tablet Take 1 tablet (20 mg total) by mouth daily. 10/11/20 12/06/20  ScDonato HeinzMD  omeprazole (PRILOSEC) 40 MG capsule Take 1 capsule (40 mg total) by mouth daily. 07/28/20 10/30/20  Hall-Potvin, BrTanzaniaPA-C      Allergies    Aspirin, Tramadol, Hydrocodone, Ibuprofen, Morphine and related, and Orange fruit [citrus]    Review of Systems   Review of Systems  Cardiovascular:  Positive for chest pain.  All other systems reviewed and are negative.   Physical Exam Updated Vital Signs BP (!) 129/96   Pulse 62   Temp 98 F (36.7 C) (Oral)   Resp 18   Ht _0  (1.702 m)   Wt 94.3 kg   LMP 01/24/2015 (Approximate)   SpO2 100%   BMI 32.56 kg/m  Physical Exam Vitals and nursing note reviewed.  Constitutional:      General: She is not in acute distress.    Appearance: She is well-developed.  HENT:     Head: Normocephalic and atraumatic.  Eyes:     Conjunctiva/sclera: Conjunctivae normal.  Cardiovascular:     Rate and Rhythm: Normal rate and regular rhythm.     Heart sounds: No murmur heard. Pulmonary:     Effort: Pulmonary effort is normal. No respiratory distress.     Breath sounds: Normal breath sounds.  Chest:     Chest wall: Tenderness present.  Abdominal:     Palpations: Abdomen is soft.     Tenderness: There is no abdominal tenderness.  Musculoskeletal:        General: No swelling.     Cervical back: Neck supple.     Right lower leg: Edema present.     Left lower leg: Edema present.  Skin:    General: Skin is warm and dry.     Capillary Refill: Capillary refill takes less than 2 seconds.  Neurological:     Mental Status: She is alert.  Psychiatric:        Mood and Affect: Mood normal.     ED Results / Procedures / Treatments   Labs (all labs ordered are listed, but only abnormal results are  displayed) Labs Reviewed  BASIC METABOLIC PANEL - Abnormal; Notable for the following components:      Result Value   Glucose, Bld 344 (*)    Calcium 8.8 (*)    All other components within normal limits  URINALYSIS, ROUTINE W REFLEX MICROSCOPIC - Abnormal; Notable for the following components:   APPearance HAZY (*)  Glucose, UA >=500 (*)    Hgb urine dipstick LARGE (*)    Leukocytes,Ua TRACE (*)    RBC / HPF >50 (*)    All other components within normal limits  URINE CULTURE  CBC WITH DIFFERENTIAL/PLATELET  TROPONIN I (HIGH SENSITIVITY)  TROPONIN I (HIGH SENSITIVITY)    EKG EKG Interpretation  Date/Time:  Monday June 16 2022 02:46:24 EDT Ventricular Rate:  63 PR Interval:  194 QRS Duration: 104 QT Interval:  404 QTC Calculation: 413 R Axis:   -34 Text Interpretation: Normal sinus rhythm Left axis deviation Moderate voltage criteria for LVH, may be normal variant ( R in aVL , Cornell product ) Septal infarct , age undetermined Abnormal ECG When compared with ECG of 03-Apr-2022 03:37, PREVIOUS ECG IS PRESENT No significant change since last tracing Confirmed by Lacretia Leigh ([id number]) on 06/16/2022 7:56:42 AM  Radiology DG Chest 2 View  Result Date: 06/16/2022 CLINICAL DATA:  Chest pain EXAM: CHEST - 2 VIEW COMPARISON:  03/31/2022 FINDINGS: Cardiac shadow is stable. Lungs are well aerated bilaterally. Very mild vascular congestion is seen without edema. No bony abnormality is noted. IMPRESSION: Mild vascular congestion without edema. Electronically Signed   By: Inez Catalina M.D.   On: 06/16/2022 03:29    Procedures Procedures    Medications Ordered in ED Medications  rivaroxaban (XARELTO) tablet 20 mg (20 mg Oral Given 06/16/22 0926)  cefdinir (OMNICEF) capsule 300 mg (300 mg Oral Given 06/16/22 1047)  alum & mag hydroxide-simeth (MAALOX/MYLANTA) 200-200-20 MG/5ML suspension 30 mL (30 mLs Oral Given 06/16/22 0926)  famotidine (PEPCID) tablet 40 mg (40 mg Oral Given 06/16/22  0926)  clopidogrel (PLAVIX) tablet 75 mg (75 mg Oral Given 06/16/22 0926)  carvedilol (COREG) tablet 25 mg (25 mg Oral Given 06/16/22 1047)  losartan (COZAAR) tablet 25 mg (25 mg Oral Given 06/16/22 1047)  isosorbide mononitrate (IMDUR) 24 hr tablet 60 mg (60 mg Oral Given 06/16/22 1047)    ED Course/ Medical Decision Making/ A&P                           Medical Decision Making  56 year old female with significant cardiac history who presents with burning chest pain.  Also with painless hematuria.  Hypertensive, otherwise physical exam is reassuring.  Not tachycardic, not hypoxic on room air.  EKG shows no acute changes to suggest an acute ischemic event.  CXR is without findings to explain patient's current clinical syndrome.  Troponins x2 negative.  Will treat as GERD for now with Maalox and Pepcid and observe in ED.  Low index of suspicion for ACS given quality of pain and negative work-up thus far.  Do not think this is a DVT given no dyspnea, tachycardia, tachypnea, hypoxia.  Hematuria is unrelated, likely due to antiplatelets plus anticoagulants.  However she does have moderate pyuria.  No flank pain or dysuria.  We will initiate treatment with cefdinir for presumed UTI. Will restart home BP medications as patient is hypertensive to 425 systolic.   Co morbidities that complicate the patient evaluation  History of ACS History of CVA Diabetes Hypertension  Additional history obtained:  Additional history and/or information obtained from chart review External records from outside source obtained and reviewed including charts from prior hospital encounters  Lab Tests:  I Ordered (or co-signed), and personally interpreted labs.  The pertinent results include:   BMP shows hyperglycemia CBC within normal limits UA with large hematuria and moderate pyuria, no bacteria  Troponins within normal limits  Imaging Studies ordered:  I ordered (or co-signed) imaging studies including chest  x-ray I independently visualized and interpreted imaging which showed mild vascular congestion, no pulmonary edema or pleural effusion.  No focal consolidation. I agree with the radiologist interpretation  Cardiac Monitoring:  The patient was maintained on a cardiac monitor.  The cardiac monitored showed an rhythm of normal sinus rhythm. The patient was also maintained on pulse oximetry. The readings were typically within normal limits.  Medicines ordered and prescription drug management:  I ordered medication including cefdinir 300 mg p.o., clopidogrel 75 mg p.o., famotidine 40 mg p.o., Maalox 30 mL p.o., Xarelto 20 mg p.o. Reevaluation of the patient after these medicines showed that the patient  improved  Reevaluation:  After the interventions noted above, I reevaluated the patient and found that they have :resolved.    Dispostion:  After consideration of the diagnostic results and the patients response to treatment, I feel that the patent would benefit from discharge home with short course of antibiotics for presumed UTI and instructions to follow-up with her primary care physician.          Final Clinical Impression(s) / ED Diagnoses Final diagnoses:  Other chest pain  Gross hematuria  Pyuria, sterile    Rx / DC Orders ED Discharge Orders          Ordered    cephALEXin (KEFLEX) 250 MG capsule  4 times daily        06/16/22 1126              Nani Gasser, MD 06/16/22 1133    Lacretia Leigh, MD 06/17/22 [phone]

## 2022-06-16 NOTE — Discharge Instructions (Addendum)
You were evaluated in the emergency department for chest pain.  You are not found to have heart attack, blood clot to the lungs, pneumonia.  I think your chest pain may be due to gastroesophageal reflux disease, commonly known as heartburn.  You are treated for heartburn in the emergency department your condition improved.  You are also evaluated for bloody urine.  This might be due to to a urinary tract infection.  It could be exacerbated by your blood thinning medicines.  We will send you home with a 5-day course of antibiotics for a UTI.

## 2022-06-16 NOTE — ED Triage Notes (Signed)
Pt c/o hematuria new today. Denies other urinary symptoms. Pt began having crushing central chest pain. Does not radiate 10/10 pain. Got nitro x1. Pt is on blood thinners. Pain after nitro 6/10. Denies SOB.

## 2022-06-17 LAB — URINE CULTURE: Culture: 20000 — AB

## 2022-06-18 ENCOUNTER — Telehealth: Payer: Self-pay | Admitting: Family Medicine

## 2022-06-18 ENCOUNTER — Telehealth: Payer: Self-pay | Admitting: *Deleted

## 2022-06-18 NOTE — Telephone Encounter (Signed)
Post ED Visit - Positive Culture Follow-up  Culture report reviewed by antimicrobial stewardship pharmacist: [facility] []  , Pharm.D. []  Enzo Bi, Pharm.D., BCPS AQ-ID []  , Pharm.D., BCPS []  Celedonio Miyamoto, Pharm.D., BCPS []  [place], Garvin Fila.D., BCPS, AAHIVP []  , Pharm.D., BCPS, AAHIVP [x]  Georgina Pillion, PharmD, BCPS []  , PharmD, BCPS []  [place], PharmD, BCPS []  [address], PharmD []  , PharmD, BCPS []  Estella Husk, PharmD  Pharmacy Team []  Lysle Pearl, PharmD []  , PharmD []  Phillips Climes, PharmD []  , Rph []  Agapito Games) , PharmD []  Verlan Friends, PharmD []  , PharmD []  Mervyn Gay, PharmD []  , PharmD []  Vinnie Level, PharmD []  [facility], PharmD []  , PharmD []  Len Childs, PharmD   Positive urine culture Treated with Cephalexin, organism sensitive to the same and no further patient follow-up is required at this time.  [facility] 06/18/2022, 10:17 AM

## 2022-06-18 NOTE — Telephone Encounter (Signed)
Wynona Canes, RN notified of approval for VO- SN

## 2022-06-18 NOTE — Telephone Encounter (Signed)
Home Health Verbal Orders - Caller/Agency: [facility]/ [facility]  Callback Number: [phone]/ vm can be left  Requesting Skilled Nursing Frequency: Skilled Nursing Eval for med teaching and disease process management

## 2022-06-23 ENCOUNTER — Telehealth: Payer: Self-pay | Admitting: Cardiology

## 2022-06-23 ENCOUNTER — Other Ambulatory Visit: Payer: Self-pay

## 2022-06-23 DIAGNOSIS — I82401 Acute embolism and thrombosis of unspecified deep veins of right lower extremity: Secondary | ICD-10-CM

## 2022-06-23 MED ORDER — RIVAROXABAN 20 MG PO TABS
20.0000 mg | ORAL_TABLET | Freq: Every day | ORAL | 1 refills | Status: DC
  Filled 2022-06-23: qty 90, 90d supply, fill #0

## 2022-06-23 NOTE — Telephone Encounter (Signed)
*  STAT* If patient is at the pharmacy, call can be transferred to refill team.   1. Which medications need to be refilled? (please list name of each medication and dose if known) RIVAROXABAN (XARELTO) VTE STARTER PACK (15 & 20 MG)  2. Which pharmacy/location (including street and city if local pharmacy) is medication to be sent to? [facility]  3. Do they need a 30 day or 90 day supply? 90

## 2022-06-23 NOTE — Telephone Encounter (Signed)
Refill sent.

## 2022-06-23 NOTE — Telephone Encounter (Signed)
Prescription refill request for Xarelto received.  Indication:DVT Last office visit: 05/26/22 Molli Hazard) Weight: Age: 56 Scr: 0.79 (06/16/22)  CrCl: 119.51ml/min  Appropriate dose and refill sent to requested pharmacy.

## 2022-06-24 ENCOUNTER — Other Ambulatory Visit: Payer: Self-pay

## 2022-06-26 ENCOUNTER — Other Ambulatory Visit: Payer: Self-pay

## 2022-06-26 ENCOUNTER — Encounter: Payer: Self-pay | Admitting: Family Medicine

## 2022-06-26 ENCOUNTER — Ambulatory Visit: Payer: No Typology Code available for payment source | Attending: Family Medicine | Admitting: Family Medicine

## 2022-06-26 VITALS — BP 143/74 | HR 73 | Temp 98.6°F | Resp 15

## 2022-06-26 DIAGNOSIS — E1142 Type 2 diabetes mellitus with diabetic polyneuropathy: Secondary | ICD-10-CM

## 2022-06-26 DIAGNOSIS — K219 Gastro-esophageal reflux disease without esophagitis: Secondary | ICD-10-CM | POA: Diagnosis not present

## 2022-06-26 DIAGNOSIS — F419 Anxiety disorder, unspecified: Secondary | ICD-10-CM

## 2022-06-26 DIAGNOSIS — I82442 Acute embolism and thrombosis of left tibial vein: Secondary | ICD-10-CM

## 2022-06-26 DIAGNOSIS — E1159 Type 2 diabetes mellitus with other circulatory complications: Secondary | ICD-10-CM

## 2022-06-26 DIAGNOSIS — I69354 Hemiplegia and hemiparesis following cerebral infarction affecting left non-dominant side: Secondary | ICD-10-CM

## 2022-06-26 DIAGNOSIS — F32A Depression, unspecified: Secondary | ICD-10-CM

## 2022-06-26 DIAGNOSIS — I2510 Atherosclerotic heart disease of native coronary artery without angina pectoris: Secondary | ICD-10-CM

## 2022-06-26 DIAGNOSIS — I152 Hypertension secondary to endocrine disorders: Secondary | ICD-10-CM

## 2022-06-26 DIAGNOSIS — Z9861 Coronary angioplasty status: Secondary | ICD-10-CM

## 2022-06-26 LAB — POCT GLYCOSYLATED HEMOGLOBIN (HGB A1C): Hemoglobin A1C: 9.4 % — AB (ref 4.0–5.6)

## 2022-06-26 MED ORDER — BASAGLAR KWIKPEN 100 UNIT/ML ~~LOC~~ SOPN
20.0000 [IU] | PEN_INJECTOR | Freq: Two times a day (BID) | SUBCUTANEOUS | 3 refills | Status: DC
  Filled 2022-06-26: qty 15, 38d supply, fill #0

## 2022-06-26 MED ORDER — BACLOFEN 10 MG PO TABS
ORAL_TABLET | ORAL | 3 refills | Status: DC
  Filled 2022-06-26: qty 30, 30d supply, fill #0

## 2022-06-26 MED ORDER — GABAPENTIN 300 MG PO CAPS
300.0000 mg | ORAL_CAPSULE | Freq: Every day | ORAL | 3 refills | Status: DC
  Filled 2022-06-26 – 2022-09-12 (×3): qty 30, 30d supply, fill #0

## 2022-06-26 MED ORDER — DULOXETINE HCL 60 MG PO CPEP
60.0000 mg | ORAL_CAPSULE | Freq: Every day | ORAL | 3 refills | Status: DC
  Filled 2022-06-26: qty 30, 30d supply, fill #0
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
## 2022-06-26 NOTE — Patient Instructions (Signed)
Cognitive Rehabilitation After a Stroke A stroke is caused by not getting enough blood flow to the brain. This can affect a person's thinking and memory. Rehabilitation is important for improving these areas. It can help with attention span, decision-making (executive function), and with the ability to recall information, such as words and objects. It also can help with language and perception. Rehabilitation can help to improve quality of life. What is cognitive rehabilitation? Cognitive rehabilitation is a program to help you improve your thinking skills after a stroke. It can help with memory, problem-solving, and communication skills. Therapy focuses on: Improving brain function. This may involve learning to break down tasks into simple steps. Coping with thinking problems. You might learn ways to help improve your memory or do activities that stimulate memory. These may include naming objects or describing pictures. Types of rehabilitation Cognitive rehabilitation refers to a group of therapies and may include: Speech-language therapy to help you understand and communicate better. Occupational therapy to help you with daily activities. Music therapy to help with stress, anxiety, and depression. This may involve listening to music, singing, or playing instruments. Physical therapy to help you get stronger and move better. These therapies may involve: Helping you to learn ways to cope with memory problems, such as setting alarms to remind you to take medicines. You also may learn new strategies to help your memory. Using virtual reality or video games to help you remember words, names of objects, and other things. Exercise to help increase blood flow to the brain. Summary After a stroke, some people have problems with thinking, memory, language, communication, and problem-solving. Cognitive rehabilitation is a program to help you regain brain function and learn skills to cope with thinking  problems. Rehabilitation can help to improve quality of life. Cognitive rehabilitation may include speech-language therapy, occupational therapy, music therapy, and physical therapy. This information is not intended to replace advice given to you by your health care provider. Make sure you discuss any questions you have with your health care provider. Document Revised: 02/19/2022 Document Reviewed: 09/21/2020 Elsevier Patient Education  2023 Elsevier Inc.  

## 2022-06-27 ENCOUNTER — Other Ambulatory Visit: Payer: Self-pay | Admitting: Physician Assistant

## 2022-06-27 DIAGNOSIS — K920 Hematemesis: Secondary | ICD-10-CM

## 2022-06-27 DIAGNOSIS — I214 Non-ST elevation (NSTEMI) myocardial infarction: Secondary | ICD-10-CM

## 2022-06-27 DIAGNOSIS — E1159 Type 2 diabetes mellitus with other circulatory complications: Secondary | ICD-10-CM

## 2022-06-27 NOTE — Telephone Encounter (Signed)
Pepcid last RF 04/24/22 #60 2 RF Glipizide last RF 04/24/22 #60 3 RF Atorvastatin last RF 04/24/22 #30 3 RF  Pt has appt 07/04/22   Requested Prescriptions  Refused Prescriptions Disp Refills  . famotidine (PEPCID) 20 MG tablet [Pharmacy Med Name: FAMOTIDINE 20 MG TABLET] 60 tablet 10    Sig: TAKE 1 TABLET BY MOUTH TWICE DAILY     Gastroenterology:  H2 Antagonists Passed - 06/27/2022  2:44 PM      Passed - Valid encounter within last 12 months    Recent Outpatient Visits          Yesterday Diabetic polyneuropathy associated with type 2 diabetes mellitus (HCC)   [facility] [place], Odette Horns, MD   2 months ago Type 2 diabetes mellitus with other circulatory complication, without long-term current use of insulin (HCC)   [place] [facility] [place], [place], [place]   1 year ago Type 2 diabetes mellitus with other circulatory complication, without long-term current use of insulin (HCC)   [facility] [place], Odette Horns, MD   2 years ago Type 2 diabetes mellitus with other circulatory complication, without long-term current use of insulin (HCC)   [place] [facility] [place], Cornelius Moras, RPH-CPP   2 years ago Encounter for medication review and counseling   [facility] Drucilla Chalet, RPH-CPP      Future Appointments            In 1 week Lois Huxley, Cornelius Moras, RPH-CPP [facility]   In 1 month [place], [place], MD [facility]   In 3 months Bjorn Pippin, Tanna [place], MD [facility], [facility]           . glipiZIDE (GLUCOTROL) 10 MG tablet [Pharmacy Med Name: GLIPIZIDE 10 MG TABLET] 60 tablet 10    Sig: TAKE 1 TABLET BY MOUTH TWICE DAILY BEFORE MEALS     Endocrinology:  Diabetes - Sulfonylureas Failed - 06/27/2022  2:44 PM      Failed - HBA1C is between 0 and 7.9 and within 180 days     Hemoglobin A1C  Date Value Ref Range Status  06/26/2022 9.4 (A) 4.0 - 5.6 % Final   HbA1c, POC (controlled diabetic range)  Date Value Ref Range Status  12/06/2020 11.1 (A) 0.0 - 7.0 % Final   Hgb A1c MFr Bld  Date Value Ref Range Status  03/31/2022 12.8 (H) 4.8 - 5.6 % Final    Comment:    (NOTE) Pre diabetes:          5.7%-6.4%  Diabetes:              >6.4%  Glycemic control for   <7.0% adults with diabetes          Passed - Cr in normal range and within 360 days    Creatinine, Ser  Date Value Ref Range Status  06/16/2022 0.79 0.44 - 1.00 mg/dL Final         Passed - Valid encounter within last 6 months    Recent Outpatient Visits          Yesterday Diabetic polyneuropathy associated with type 2 diabetes mellitus (HCC)   [place] [facility] [place], Odette Horns, MD   2 months ago Type 2 diabetes mellitus with other circulatory complication, without long-term current use of insulin (HCC)   [place]  [facility] [place], [place], [place]   1 year ago Type 2 diabetes mellitus with other circulatory complication, without long-term current use of insulin (HCC)   [place] [facility] [place], [place], [place]   2 years ago Type 2 diabetes mellitus with other circulatory complication, without long-term current use of insulin [facility])   [place] [facility] Lois Huxley, Cornelius Moras, RPH-CPP   2 years ago Encounter for medication review and counseling   [facility] Drucilla Chalet, RPH-CPP      Future Appointments            In 1 week Lois Huxley, Cornelius Moras, RPH-CPP [facility]   In 1 month Hoy Register, MD [facility]   In 3 months [place] Ishikawa, MD [facility], [place]           . atorvastatin (LIPITOR) 80 MG tablet [Pharmacy Med Name: ATORVASTATIN 80 MG TABLET] 30 tablet 10    Sig:  TAKE 1 TABLET BY MOUTH ONCE DAILY     Cardiovascular:  Antilipid - Statins Failed - 06/27/2022  2:44 PM      Failed - Lipid Panel in normal range within the last 12 months    Cholesterol, Total  Date Value Ref Range Status  10/30/2020 137 100 - 199 mg/dL Final   Cholesterol  Date Value Ref Range Status  03/31/2022 254 (H) 0 - 200 mg/dL Final   LDL Chol Calc (NIH)  Date Value Ref Range Status  10/30/2020 52 0 - 99 mg/dL Final   LDL Cholesterol  Date Value Ref Range Status  03/31/2022 173 (H) 0 - 99 mg/dL Final    Comment:           Total Cholesterol/HDL:CHD Risk Coronary Heart Disease Risk Table                     Men   Women  1/2 Average Risk   3.4   3.3  Average Risk       5.0   4.4  2 X Average Risk   9.6   7.1  3 X Average Risk  23.4   11.0        Use the calculated Patient Ratio above and the CHD Risk Table to determine the patient's CHD Risk.        ATP III CLASSIFICATION (LDL):  <100     mg/dL   Optimal  062-694  mg/dL   Near or Above                    Optimal  130-159  mg/dL   Borderline  854-627  mg/dL   High  >035     mg/dL   Very High Performed at [facility], [address], [place], [place] [address]    HDL  Date Value Ref Range Status  03/31/2022 42 >40 mg/dL Final  18/29/9371 47 >69 mg/dL Final   Triglycerides  Date Value Ref Range Status  03/31/2022 194 (H) <150 mg/dL Final         Passed - Patient is not pregnant      Passed - Valid encounter within last 12 months    Recent Outpatient Visits          Yesterday Diabetic polyneuropathy associated with type 2 diabetes mellitus [facility])   [place] [facility] Hoy Register, MD   2 months ago Type 2 diabetes mellitus with other circulatory complication, without long-term current use of insulin [facility])   [place] [facility] [place], [place], [place]   1 year ago Type 2 diabetes mellitus with other circulatory complication, without long-term current  use of insulin (HCC)   [place] [facility] [place], Odette Horns, MD   2 years ago Type 2 diabetes mellitus with other circulatory complication, without long-term current use of insulin [facility])   [place] [facility] Lois Huxley, Cornelius Moras, RPH-CPP   2 years ago Encounter for medication review and counseling   [facility] Drucilla Chalet, RPH-CPP      Future Appointments            In 1 week Lois Huxley, Cornelius Moras, RPH-CPP [facility]   In 1 month Hoy Register, MD [facility]   In 3 months Broy Ishikawa, MD [place] [facility] [place], [facility]

## 2022-07-01 ENCOUNTER — Other Ambulatory Visit: Payer: Self-pay | Admitting: Family Medicine

## 2022-07-01 ENCOUNTER — Other Ambulatory Visit: Payer: Self-pay | Admitting: Physician Assistant

## 2022-07-01 DIAGNOSIS — I10 Essential (primary) hypertension: Secondary | ICD-10-CM

## 2022-07-01 DIAGNOSIS — K920 Hematemesis: Secondary | ICD-10-CM

## 2022-07-01 DIAGNOSIS — I214 Non-ST elevation (NSTEMI) myocardial infarction: Secondary | ICD-10-CM

## 2022-07-01 DIAGNOSIS — I69354 Hemiplegia and hemiparesis following cerebral infarction affecting left non-dominant side: Secondary | ICD-10-CM

## 2022-07-01 NOTE — Telephone Encounter (Signed)
rx was sent to pharmacy on 06/26/22 #90/3 RF Requested Prescriptions  Pending Prescriptions Disp Refills  . baclofen (LIORESAL) 10 MG tablet [Pharmacy Med Name: BACLOFEN 10 MG TABLET] 90 tablet 10    Sig: TAKE 1 TABLET BY MOUTH THREE TIMES DAILY     Analgesics:  Muscle Relaxants - baclofen Passed - 07/01/2022 11:07 AM      Passed - Cr in normal range and within 180 days    Creatinine, Ser  Date Value Ref Range Status  06/16/2022 0.79 0.44 - 1.00 mg/dL Final         Passed - eGFR is 30 or above and within 180 days    GFR calc Af Amer  Date Value Ref Range Status  10/30/2020 93 >59 mL/min/1.73 Final    Comment:    **In accordance with recommendations from the NKF-ASN Task force,**   [facility] is in the process of updating its eGFR calculation to the   2021 CKD-EPI creatinine equation that estimates kidney function   without a race variable.    GFR, Estimated  Date Value Ref Range Status  06/16/2022 >60 >60 mL/min Final    Comment:    (NOTE) Calculated using the CKD-EPI Creatinine Equation (2021)    eGFR  Date Value Ref Range Status  04/24/2022 87 >59 mL/min/1.73 Final         Passed - Valid encounter within last 6 months    Recent Outpatient Visits          5 days ago Diabetic polyneuropathy associated with type 2 diabetes mellitus ([place])   [place], Gilbert, MD   2 months ago Type 2 diabetes mellitus with other circulatory complication, without long-term current use of insulin [facility])   [place], [place], [place]   1 year ago Type 2 diabetes mellitus with other circulatory complication, without long-term current use of insulin ([place])   [place], Charlane Ferretti, MD   2 years ago Type 2 diabetes mellitus with other circulatory complication, without long-term current use of insulin [facility])   [place], Jarome Matin, RPH-CPP   2  years ago Encounter for medication review and counseling   [place], RPH-CPP      Future Appointments            In 3 days Daisy Bloss[place], Jarome Matin, [place]   In 3 weeks Charlott Rakes, MD [place]   In 3 months Donato Heinz, MD [place] [facility] Of [place]. [place]

## 2022-07-01 NOTE — Telephone Encounter (Signed)
Requested Prescriptions  Pending Prescriptions Disp Refills  . carvedilol (COREG) 25 MG tablet [Pharmacy Med Name: CARVEDILOL 25 MG TABLET] 60 tablet 10    Sig: TAKE ONE TABLET BY MOUTH TWICE DAILY WITH MEALS     Cardiovascular: Beta Blockers 3 Failed - 07/01/2022 11:20 AM      Failed - Last BP in normal range    BP Readings from Last 1 Encounters:  06/26/22 (!) 143/74         Passed - Cr in normal range and within 360 days    Creatinine, Ser  Date Value Ref Range Status  06/16/2022 0.79 0.44 - 1.00 mg/dL Final         Passed - AST in normal range and within 360 days    AST  Date Value Ref Range Status  04/24/2022 16 0 - 40 IU/L Final         Passed - ALT in normal range and within 360 days    ALT  Date Value Ref Range Status  04/24/2022 19 0 - 32 IU/L Final         Passed - Last Heart Rate in normal range    Pulse Readings from Last 1 Encounters:  06/26/22 73         Passed - Valid encounter within last 6 months    Recent Outpatient Visits          5 days ago Diabetic polyneuropathy associated with type 2 diabetes mellitus (HCC)   [facility] [place], Odette Horns, MD   2 months ago Type 2 diabetes mellitus with other circulatory complication, without long-term current use of insulin [facility])   [place] [facility] [place], [place], [place]   1 year ago Type 2 diabetes mellitus with other circulatory complication, without long-term current use of insulin (HCC)   [facility] [place], Odette Horns, MD   2 years ago Type 2 diabetes mellitus with other circulatory complication, without long-term current use of insulin [facility])   [place] [facility] [place], Cornelius Moras, RPH-CPP   2 years ago Encounter for medication review and counseling   [facility] Lois Hux[place], Cornelius Moras, RPH-CPP      Future Appointments            In 3 days Lois Huxley, Cornelius Moras,  RPH-CPP [place] [facility]   In 3 weeks Hoy Register, MD [facility]   In 3 months Rueckert Ishikawa, MD [facility] [address] [facility] [place]. [facility]           . pantoprazole (PROTONIX) 40 MG tablet [Pharmacy Med Name: PANTOPRAZOLE DR 40 MG TAB] 30 tablet 10    Sig: TAKE 1 TABLET BY MOUTH AT BEDTIME     Gastroenterology: Proton Pump Inhibitors Passed - 07/01/2022 11:20 AM      Passed - Valid encounter within last 12 months    Recent Outpatient Visits          5 days ago Diabetic polyneuropathy associated with type 2 diabetes mellitus (HCC)   [place] [place], Odette Horns, MD   2 months ago Type 2 diabetes mellitus with other circulatory complication, without long-term current use of insulin [facility])   [place] [place], [place], [place]   1 year ago Type 2 diabetes mellitus with other  circulatory complication, without long-term current use of insulin (HCC)   [place] [place], [place], [place]   2 years ago Type 2 diabetes mellitus with other circulatory complication, without long-term current use of insulin [facility])   [place] [facility] Lois Huxley, Cornelius Moras, RPH-CPP   2 years ago Encounter for medication review and counseling   [facility] Drucilla Chalet, RPH-CPP      Future Appointments            In 3 days Lois Huxley, Cornelius Moras, RPH-CPP [place] [facility]   In 3 weeks Hoy Register, MD [facility]   In 3 months Shakespeare Ishikawa, MD [facility] [address] [facility] [place]. [facility] [place]

## 2022-07-04 ENCOUNTER — Other Ambulatory Visit: Payer: Self-pay

## 2022-07-04 ENCOUNTER — Ambulatory Visit: Payer: No Typology Code available for payment source | Attending: Family Medicine | Admitting: Pharmacist

## 2022-07-04 ENCOUNTER — Encounter: Payer: Self-pay | Admitting: Pharmacist

## 2022-07-04 DIAGNOSIS — I10 Essential (primary) hypertension: Secondary | ICD-10-CM | POA: Diagnosis not present

## 2022-07-04 DIAGNOSIS — I6340 Cerebral infarction due to embolism of unspecified cerebral artery: Secondary | ICD-10-CM

## 2022-07-04 DIAGNOSIS — F419 Anxiety disorder, unspecified: Secondary | ICD-10-CM | POA: Diagnosis not present

## 2022-07-04 DIAGNOSIS — I214 Non-ST elevation (NSTEMI) myocardial infarction: Secondary | ICD-10-CM

## 2022-07-04 DIAGNOSIS — F32A Depression, unspecified: Secondary | ICD-10-CM

## 2022-07-04 DIAGNOSIS — I82401 Acute embolism and thrombosis of unspecified deep veins of right lower extremity: Secondary | ICD-10-CM

## 2022-07-04 DIAGNOSIS — K920 Hematemesis: Secondary | ICD-10-CM

## 2022-07-04 DIAGNOSIS — E1159 Type 2 diabetes mellitus with other circulatory complications: Secondary | ICD-10-CM

## 2022-07-04 MED ORDER — LOSARTAN POTASSIUM 25 MG PO TABS: 12.5000 mg | ORAL_TABLET | Freq: Every day | ORAL | 3 refills | Status: DC

## 2022-07-04 MED ORDER — ISOSORBIDE MONONITRATE ER 60 MG PO TB24
60.0000 mg | ORAL_TABLET | Freq: Every day | ORAL | 3 refills | Status: DC
  Filled 2022-09-05: qty 30, 30d supply, fill #0
  Filled 2022-09-12: qty 90, 90d supply, fill #0
  Filled 2023-01-02: qty 30, 30d supply, fill #1
  Filled 2023-01-30: qty 30, 30d supply, fill #2
  Filled 2023-03-05: qty 30, 30d supply, fill #3
  Filled 2023-04-08: qty 30, 30d supply, fill #4
  Filled 2023-05-04 – 2023-05-11 (×2): qty 30, 30d supply, fill #5
  Filled 2023-06-10: qty 30, 30d supply, fill #6

## 2022-07-04 MED ORDER — RIVAROXABAN 20 MG PO TABS: 20.0000 mg | ORAL_TABLET | Freq: Every day | ORAL | 1 refills | Status: DC

## 2022-07-04 MED ORDER — ATORVASTATIN CALCIUM 80 MG PO TABS
80.0000 mg | ORAL_TABLET | Freq: Every day | ORAL | 3 refills | Status: DC
  Filled 2022-09-05 – 2022-09-12 (×2): qty 30, 30d supply, fill #0
  Filled 2022-10-08: qty 30, 30d supply, fill #1
  Filled 2022-11-25 – 2022-12-02 (×2): qty 30, 30d supply, fill #2

## 2022-07-04 MED ORDER — PANTOPRAZOLE SODIUM 40 MG PO TBEC: DELAYED_RELEASE_TABLET | ORAL | 3 refills | Status: DC

## 2022-07-04 MED ORDER — DULOXETINE HCL 60 MG PO CPEP
60.0000 mg | ORAL_CAPSULE | Freq: Every day | ORAL | 3 refills | Status: DC
  Filled 2022-09-05 – 2022-09-12 (×2): qty 30, 30d supply, fill #0
  Filled 2022-10-08: qty 30, 30d supply, fill #1
  Filled 2022-11-25 – 2022-12-02 (×2): qty 30, 30d supply, fill #2

## 2022-07-04 MED ORDER — ALBUTEROL SULFATE HFA 108 (90 BASE) MCG/ACT IN AERS: INHALATION_SPRAY | RESPIRATORY_TRACT | 0 refills | Status: DC

## 2022-07-04 MED ORDER — GLIPIZIDE 10 MG PO TABS
10.0000 mg | ORAL_TABLET | Freq: Two times a day (BID) | ORAL | 3 refills | Status: DC
  Filled 2022-09-05 – 2022-09-12 (×2): qty 60, 30d supply, fill #0
  Filled 2022-11-25 – 2022-12-02 (×2): qty 60, 30d supply, fill #1
  Filled 2023-01-19: qty 60, 30d supply, fill #2

## 2022-07-04 MED ORDER — CLOPIDOGREL BISULFATE 75 MG PO TABS
ORAL_TABLET | ORAL | 11 refills | Status: DC
  Filled 2022-09-05 – 2022-09-12 (×2): qty 30, 30d supply, fill #0
  Filled 2022-10-08: qty 30, 30d supply, fill #1
  Filled 2022-11-25 – 2022-12-02 (×2): qty 30, 30d supply, fill #2
  Filled 2023-01-02: qty 30, 30d supply, fill #3
  Filled 2023-01-30: qty 30, 30d supply, fill #4
  Filled 2023-03-05: qty 30, 30d supply, fill #5
  Filled 2023-04-08: qty 30, 30d supply, fill #6
  Filled 2023-05-04 – 2023-05-11 (×2): qty 30, 30d supply, fill #7
  Filled 2023-06-10: qty 30, 30d supply, fill #8

## 2022-07-04 MED ORDER — BASAGLAR KWIKPEN 100 UNIT/ML ~~LOC~~ SOPN
20.0000 [IU] | PEN_INJECTOR | Freq: Two times a day (BID) | SUBCUTANEOUS | 3 refills | Status: DC
  Filled 2022-09-05 – 2022-09-12 (×2): qty 15, 38d supply, fill #0
  Filled 2022-11-25 – 2022-12-02 (×3): qty 15, 38d supply, fill #1

## 2022-07-04 MED ORDER — SEMAGLUTIDE(0.25 OR 0.5MG/DOS) 2 MG/3ML ~~LOC~~ SOPN: 0.2500 mg | PEN_INJECTOR | SUBCUTANEOUS | 1 refills | Status: DC

## 2022-07-04 MED ORDER — CARVEDILOL 25 MG PO TABS
ORAL_TABLET | ORAL | 3 refills | Status: DC
  Filled 2022-09-05 – 2022-09-12 (×2): qty 60, 30d supply, fill #0

## 2022-07-04 NOTE — Progress Notes (Signed)
    S:     No chief complaint on file.  [name] is a 56 y.o. female who presents for diabetes evaluation, education, and management.  PMH is significant for T2DM, HTN, CAD S/p DES of PDA, NSTEMI, DVT.  Patient was referred and last seen by Primary Care Provider, Dr. Alvis Lemmings, on 06/26/2022. A1c was 9.4% at that visit. Insulin was increased and she was instructed to follow-up with me for GLP-1 RA start.   Today, patient arrives in good spirits and presents without any assistance.  Patient reports Diabetes is longstanding. Her blood sugars are still running >200 despite the increase in her insulin at last visit. She denies any hx of pancreatitis or thyroid cancer. She denies any visual concerns. Denies any changes in neuropathy symptoms but does endorse polyuria. She denies any hypoglycemia.   Family/Social History:  -FHX: cancer, DM, heart disease  -Tobacco: never smoker  -Alcohol: none reported   Current diabetes medications include: Lantus 20u BID, glipizide 10 mg BID  Patient reports adherence to taking all medications as prescribed.   Insurance coverage: [employer]  Patient denies hypoglycemic events.  Reported home fasting blood sugars: 200s-300s. Does not have a meter with her today.    Patient reported dietary habits: Eats 1 meals/day Dinner: tries to limit portions but admits to "eating everything". Does not appear to adhere to a diabetic diet.   Patient-reported exercise habits: none    O:   ROS  Physical Exam  7 day average blood glucose: no meter  No CGM in place   Lab Results  Component Value Date   HGBA1C 9.4 (A) 06/26/2022   There were no vitals filed for this visit.  Lipid Panel     Component Value Date/Time   CHOL 254 (H) 03/31/2022 0301   CHOL 137 10/30/2020 1649   TRIG 194 (H) 03/31/2022 0301   HDL 42 03/31/2022 0301   HDL 47 10/30/2020 1649   CHOLHDL 6.0 03/31/2022 0301   VLDL 39 03/31/2022 0301   LDLCALC 173 (H) 03/31/2022 0301    LDLCALC 52 10/30/2020 1649    Clinical Atherosclerotic Cardiovascular Disease (ASCVD): Yes  The ASCVD Risk score (Arnett DK, et al., 2019) failed to calculate for the following reasons:   The patient has a prior MI or stroke diagnosis   A/P: Diabetes longstanding currently uncontrolled. Patient is able to verbalize appropriate hypoglycemia management plan. Medication adherence appears appropriate. -Continued Lantus 20 u BID for now. Continue glipizide 10 mg BID for now. May need to consider dose reduction as we titrate Ozempic to minimize hypoglycemia risk.  -Started Ozempic 0.25 mg weekly.  -Patient educated on purpose, proper use, and potential adverse effects of Ozempic.  -Extensively discussed pathophysiology of diabetes, recommended lifestyle interventions, dietary effects on blood sugar control.  -Counseled on s/sx of and management of hypoglycemia.  -Next A1c anticipated 09/2022.  -Med refills sent to patient's pref mail delivery pharmacy.   Written patient instructions provided. Patient verbalized understanding of treatment plan.  Total time in face to face counseling 30 minutes.    Follow-up:  Pharmacist in 1 month.  Butch Penny, PharmD, [place]y [place], CPP Clinical Pharmacist [facility] [phone]

## 2022-07-08 ENCOUNTER — Other Ambulatory Visit (HOSPITAL_COMMUNITY): Payer: Self-pay

## 2022-07-08 ENCOUNTER — Ambulatory Visit: Payer: Self-pay | Admitting: *Deleted

## 2022-07-08 ENCOUNTER — Other Ambulatory Visit: Payer: Self-pay | Admitting: Pharmacist

## 2022-07-08 MED ORDER — ALBUTEROL SULFATE HFA 108 (90 BASE) MCG/ACT IN AERS: INHALATION_SPRAY | RESPIRATORY_TRACT | 0 refills | Status: DC

## 2022-07-08 NOTE — Telephone Encounter (Signed)
Summary: Medication Clarification   Alyssa with [employer] called to get clarification on the quantity of albuterol (VENTOLIN HFA) 108 (90 Base) MCG/ACT inhaler.      Insurance will cover Ventolin if it is 18 g. ProAir comes in 8.5 g. Emilia Beck Ausdall L. RPH-CPP notified. Will wait for an answer for answer from him. Rx resent by [place]iana Shore Ausdall.

## 2022-07-09 ENCOUNTER — Other Ambulatory Visit: Payer: Self-pay | Admitting: Family Medicine

## 2022-07-15 ENCOUNTER — Ambulatory Visit: Payer: 59 | Admitting: General Practice

## 2022-07-15 ENCOUNTER — Other Ambulatory Visit: Payer: Self-pay

## 2022-07-15 NOTE — Patient Outreach (Signed)
  Care Coordination   07/15/2022 Name: XANDREA CLAREY MRN: [id number] DOB: 12/27/1965    Telephone outreach to patient to obtain mRS was successfully completed. MRS= 2  Submitted a patient experience of patients behalf based off information given about [facility] experience.   Vanice Sarah [facility] Care Management Assistant [phone]

## 2022-07-28 ENCOUNTER — Telehealth: Payer: Self-pay

## 2022-07-28 ENCOUNTER — Ambulatory Visit: Payer: No Typology Code available for payment source | Attending: Family Medicine | Admitting: Family Medicine

## 2022-07-28 ENCOUNTER — Encounter: Payer: Self-pay | Admitting: Family Medicine

## 2022-07-28 ENCOUNTER — Other Ambulatory Visit: Payer: Self-pay

## 2022-07-28 VITALS — BP 160/89 | HR 68 | Temp 98.0°F | Ht 67.0 in | Wt 206.6 lb

## 2022-07-28 DIAGNOSIS — R310 Gross hematuria: Secondary | ICD-10-CM

## 2022-07-28 DIAGNOSIS — I214 Non-ST elevation (NSTEMI) myocardial infarction: Secondary | ICD-10-CM

## 2022-07-28 DIAGNOSIS — I69354 Hemiplegia and hemiparesis following cerebral infarction affecting left non-dominant side: Secondary | ICD-10-CM

## 2022-07-28 DIAGNOSIS — E1159 Type 2 diabetes mellitus with other circulatory complications: Secondary | ICD-10-CM

## 2022-07-28 DIAGNOSIS — I10 Essential (primary) hypertension: Secondary | ICD-10-CM

## 2022-07-28 DIAGNOSIS — I82401 Acute embolism and thrombosis of unspecified deep veins of right lower extremity: Secondary | ICD-10-CM

## 2022-07-28 DIAGNOSIS — K5909 Other constipation: Secondary | ICD-10-CM

## 2022-07-28 LAB — GLUCOSE, POCT (MANUAL RESULT ENTRY): POC Glucose: 285 mg/dl — AB (ref 70–99)

## 2022-07-28 MED ORDER — POLYETHYLENE GLYCOL 3350 17 GM/SCOOP PO POWD: 17.0000 g | Freq: Every day | ORAL | 1 refills | Status: DC

## 2022-07-28 MED ORDER — SEMAGLUTIDE(0.25 OR 0.5MG/DOS) 2 MG/3ML ~~LOC~~ SOPN: 0.5000 mg | PEN_INJECTOR | SUBCUTANEOUS | 1 refills | Status: DC

## 2022-07-28 MED ORDER — LOSARTAN POTASSIUM 50 MG PO TABS: 50.0000 mg | ORAL_TABLET | Freq: Every day | ORAL | 1 refills | Status: DC

## 2022-07-28 NOTE — Telephone Encounter (Signed)
[employer] taxi contacted and ride arranged for patient to get home from [facility]
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
## 2022-07-29 LAB — CBC WITH DIFFERENTIAL/PLATELET
Basophils Absolute: 0 10*3/uL (ref 0.0–0.2)
Basos: 1 %
EOS (ABSOLUTE): 0.1 10*3/uL (ref 0.0–0.4)
Eos: 1 %
Hematocrit: 39.6 % (ref 34.0–46.6)
Hemoglobin: 13 g/dL (ref 11.1–15.9)
Immature Grans (Abs): 0 10*3/uL (ref 0.0–0.1)
Immature Granulocytes: 0 %
Lymphocytes Absolute: 2.2 10*3/uL (ref 0.7–3.1)
Lymphs: 32 %
MCH: 30.2 pg (ref 26.6–33.0)
MCHC: 32.8 g/dL (ref 31.5–35.7)
MCV: 92 fL (ref 79–97)
Monocytes Absolute: 0.4 10*3/uL (ref 0.1–0.9)
Monocytes: 6 %
Neutrophils Absolute: 4.1 10*3/uL (ref 1.4–7.0)
Neutrophils: 60 %
Platelets: 217 10*3/uL (ref 150–450)
RBC: 4.31 x10E6/uL (ref 3.77–5.28)
RDW: 13.3 % (ref 11.7–15.4)
WBC: 6.8 10*3/uL (ref 3.4–10.8)

## 2022-08-01 ENCOUNTER — Other Ambulatory Visit: Payer: Self-pay | Admitting: Physician Assistant

## 2022-08-01 DIAGNOSIS — K920 Hematemesis: Secondary | ICD-10-CM

## 2022-08-05 ENCOUNTER — Other Ambulatory Visit: Payer: Self-pay | Admitting: Family Medicine

## 2022-08-05 DIAGNOSIS — E1142 Type 2 diabetes mellitus with diabetic polyneuropathy: Secondary | ICD-10-CM

## 2022-08-05 NOTE — Telephone Encounter (Signed)
Refilled 06/26/2022 #30 3 rf. Requested Prescriptions  Pending Prescriptions Disp Refills  . gabapentin (NEURONTIN) 300 MG capsule [Pharmacy Med Name: GABAPENTIN 300 MG CAPSULE] 30 capsule 10    Sig: TAKE 1 CAPSULE BY MOUTH AT BEDTIME     Neurology: Anticonvulsants - gabapentin Passed - 08/05/2022 12:28 AM      Passed - Cr in normal range and within 360 days    Creatinine, Ser  Date Value Ref Range Status  06/16/2022 0.79 0.44 - 1.00 mg/dL Final         Passed - Completed PHQ-2 or PHQ-9 in the last 360 days      Passed - Valid encounter within last 12 months    Recent Outpatient Visits          1 week ago Type 2 diabetes mellitus with other circulatory complication, without long-term current use of insulin ([place])   [place], Kenel, MD   1 month ago NSTEMI (non-ST elevated myocardial infarction) [facility])   [place], Annie Main L, RPH-CPP   1 month ago Diabetic polyneuropathy associated with type 2 diabetes mellitus ([place])   [place], Charlane Ferretti, MD   3 months ago Type 2 diabetes mellitus with other circulatory complication, without long-term current use of insulin [facility])   [place], [place], [place]   1 year ago Type 2 diabetes mellitus with other circulatory complication, without long-term current use of insulin [facility])   [place], Enobong, MD      Future Appointments            In 2 days Daisy Blossom, Jarome [place]atin, [place]   In 3 weeks Ladell Pier, MD [place]   In 2 months Donato Heinz, MD [place] [facility] [place]

## 2022-08-05 NOTE — Progress Notes (Deleted)
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 female who presents for diabetes evaluation, education, and management.  PMH is significant for T2DM, HTN, CAD S/p DES of PDA, NSTEMI, DVT.   Patient was initially referred by Primary Care Provider, Dr. Alvis Lemmings, on 06/26/2022. A1c was 9.4% at that visit. Insulin was increased and she was instructed to follow-up with the clinical pharmacist for GLP-1 RA start. .   At last visit with the clinical pharmacist on 9/1, Ozempic was initiated.  At that time, she reported consistent BG readings >200.   On 9/25, the Ozempic dose was increased to 0.5mg  weekly at her follow up visit with Dr. Alvis Lemmings.  Today, patient arrives in *** good spirits and presents without *** any assistance. ***  Patient reports diabetes is longstanding.  Family/Social History:  -FHX: cancer, DM, heart disease  -Tobacco: never smoker  -Alcohol: none reported   Current diabetes medications include: glipizide 10mg  BID, Basaglar 20 units BID, and Ozempic 0.5mg  once a week Current hypertension medications include: losartan 50mg  once daily, carvedilol 25mg  BID Current hyperlipidemia medications include: atorvastatin 80mg  once daily  Patient reports adherence to taking all medications as prescribed.  *** Patient denies adherence with medications, reports missing *** medications *** times per week, on average.  Do you feel that your medications are working for you? {YES NO:22349} Have you been experiencing any side effects to the medications prescribed? {YES NO:22349} Do you have any problems obtaining medications due to transportation or finances? {YES Insurance coverage: ***  Patient {Actions; denies-reports:120008} hypoglycemic events.  Reported home fasting blood sugars: ***  Reported 2 hour post-meal/random blood sugars: ***.  Patient {Actions; denies-reports:120008} nocturia (nighttime urination).  Patient {Actions; denies-reports:120008} neuropathy  (nerve pain). Patient {Actions; denies-reports:120008} visual changes. Patient {Actions; denies-reports:120008} self foot exams.   Patient reported dietary habits: Eats *** meals/day Breakfast: *** Lunch: *** Dinner: *** Snacks: *** Drinks: ***  Within the past 12 months, did you worry whether your food would run out before you got money to buy more? {YES NO:22349} Within the past 12 months, did the food you bought run out, and you didn't have money to get more? {YES NO:22349} PHQ-9 Score: ***  Patient-reported exercise habits: ***   O:   ROS  Physical Exam  7 day average blood glucose: ***  *** CGM Download:  % Time CGM is active: ***% Average Glucose: *** mg/dL Glucose Management Indicator: ***  Glucose Variability: *** (goal <36%) Time in Goal:  - Time in range 70-180: ***% - Time above range: ***% - Time below range: ***% Observed patterns:   Lab Results  Component Value Date   HGBA1C 9.4 (A) 06/26/2022   There were no vitals filed for this visit.  Lipid Panel     Component Value Date/Time   CHOL 254 (H) 03/31/2022 0301   CHOL 137 10/30/2020 1649   TRIG 194 (H) 03/31/2022 0301   HDL 42 03/31/2022 0301   HDL 47 10/30/2020 1649   CHOLHDL 6.0 03/31/2022 0301   VLDL 39 03/31/2022 0301   LDLCALC 173 (H) 03/31/2022 0301   LDLCALC 52 10/30/2020 1649    Clinical Atherosclerotic Cardiovascular Disease (ASCVD): Yes  The ASCVD Risk score (Arnett DK, et al., 2019) failed to calculate for the following reasons:   The patient has a prior MI or stroke diagnosis    A/P: Diabetes longstanding *** currently ***. Patient is *** able to verbalize appropriate hypoglycemia management plan. Medication  adherence appears ***. Control is suboptimal due to ***. -{Meds adjust:18428} basal insulin *** (insulin ***). Patient will continue to titrate 1 unit every *** days if fasting blood sugar > 100mg /dl until fasting blood sugars reach goal or next visit.  -{Meds  adjust:18428} rapid insulin *** (insulin ***) to ***.  -{Meds adjust:18428} GLP-1 *** (generic ***) to ***.  -{Meds adjust:18428} SGLT2-I *** (generic ***) to ***. Counseled on sick day rules. -{Meds adjust:18428} metformin *** to ***.  -Patient educated on purpose, proper use, and potential adverse effects of ***.  -Extensively discussed pathophysiology of diabetes, recommended lifestyle interventions, dietary effects on blood sugar control.  -Counseled on s/sx of and management of hypoglycemia.  -Next A1c anticipated November.   ASCVD risk - secondary prevention in patient with diabetes. Last LDL is 173 not at goal of <70 mg/dL. high intensity statin indicated.  -Continued atorvastatin 80 mg.   Hypertension longstanding *** currently ***. Blood pressure goal of <130/80 *** mmHg. Medication adherence ***. Blood pressure control is suboptimal due to ***. -***  Written patient instructions provided. Patient verbalized understanding of treatment plan.  Total time in face to face counseling *** minutes.    Follow-up:  Pharmacist ***. Dr. Wynetta Emery clinic visit in October  Maryan Puls, PharmD PGY-1 [facility] Resident

## 2022-08-07 ENCOUNTER — Ambulatory Visit: Payer: No Typology Code available for payment source | Admitting: Pharmacist

## 2022-08-25 ENCOUNTER — Other Ambulatory Visit: Payer: Self-pay

## 2022-08-29 ENCOUNTER — Other Ambulatory Visit: Payer: Self-pay

## 2022-09-01 ENCOUNTER — Encounter: Payer: Self-pay | Admitting: Internal Medicine

## 2022-09-01 ENCOUNTER — Ambulatory Visit: Payer: No Typology Code available for payment source | Attending: Internal Medicine | Admitting: Internal Medicine

## 2022-09-01 VITALS — BP 133/74 | HR 53 | Wt 212.4 lb

## 2022-09-01 DIAGNOSIS — R600 Localized edema: Secondary | ICD-10-CM | POA: Diagnosis not present

## 2022-09-01 DIAGNOSIS — I6930 Unspecified sequelae of cerebral infarction: Secondary | ICD-10-CM | POA: Diagnosis not present

## 2022-09-01 DIAGNOSIS — M545 Low back pain, unspecified: Secondary | ICD-10-CM | POA: Diagnosis not present

## 2022-09-01 MED ORDER — FUROSEMIDE 20 MG PO TABS
ORAL_TABLET | ORAL | 0 refills | Status: DC
  Filled 2022-09-05 – 2022-09-12 (×2): qty 30, 30d supply, fill #0

## 2022-09-01 MED ORDER — DICLOFENAC SODIUM 1 % EX GEL
2.0000 g | Freq: Two times a day (BID) | CUTANEOUS | 0 refills | Status: DC | PRN
  Filled 2022-09-05: qty 100, 50d supply, fill #0
  Filled 2022-09-12: qty 100, 30d supply, fill #0
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
## 2022-09-01 NOTE — Progress Notes (Signed)
Patient also stated that she is still having anxiety attacks

## 2022-09-02 ENCOUNTER — Other Ambulatory Visit: Payer: Self-pay

## 2022-09-02 LAB — URINALYSIS, ROUTINE W REFLEX MICROSCOPIC
Bilirubin, UA: NEGATIVE
Ketones, UA: NEGATIVE
Leukocytes,UA: NEGATIVE
Nitrite, UA: NEGATIVE
Protein,UA: NEGATIVE
RBC, UA: NEGATIVE
Specific Gravity, UA: 1.027 (ref 1.005–1.030)
Urobilinogen, Ur: 1 mg/dL (ref 0.2–1.0)
pH, UA: 6.5 (ref 5.0–7.5)

## 2022-09-04 ENCOUNTER — Other Ambulatory Visit: Payer: Self-pay | Admitting: Physician Assistant

## 2022-09-04 ENCOUNTER — Other Ambulatory Visit: Payer: Self-pay | Admitting: Family Medicine

## 2022-09-04 DIAGNOSIS — I214 Non-ST elevation (NSTEMI) myocardial infarction: Secondary | ICD-10-CM

## 2022-09-04 DIAGNOSIS — I10 Essential (primary) hypertension: Secondary | ICD-10-CM

## 2022-09-04 NOTE — Telephone Encounter (Signed)
Refilled 07/04/2022 #60 3 rf - same pharmacy. Requested Prescriptions  Pending Prescriptions Disp Refills   carvedilol (COREG) 25 MG tablet [Pharmacy Med Name: CARVEDILOL 25 MG TABLET] 60 tablet 10    Sig: TAKE 1 TABLET BY MOUTH TWICE DAILY WITH MEALS     Cardiovascular: Beta Blockers 3 Passed - 09/04/2022 12:28 AM      Passed - Cr in normal range and within 360 days    Creatinine, Ser  Date Value Ref Range Status  06/16/2022 0.79 0.44 - 1.00 mg/dL Final         Passed - AST in normal range and within 360 days    AST  Date Value Ref Range Status  04/24/2022 16 0 - 40 IU/L Final         Passed - ALT in normal range and within 360 days    ALT  Date Value Ref Range Status  04/24/2022 19 0 - 32 IU/L Final         Passed - Last BP in normal range    BP Readings from Last 1 Encounters:  09/01/22 133/74         Passed - Last Heart Rate in normal range    Pulse Readings from Last 1 Encounters:  09/01/22 (!) 53         Passed - Valid encounter within last 6 months    Recent Outpatient Visits           3 days ago Edema of both legs   [place], Neoma Laming B, MD   1 month ago Type 2 diabetes mellitus with other circulatory complication, without long-term current use of insulin ([place])   [place], Cannon Ball, MD   2 months ago NSTEMI (non-ST elevated myocardial infarction) [facility])   [place], Annie Main L, RPH-CPP   2 months ago Diabetic polyneuropathy associated with type 2 diabetes mellitus ([place])   [place], Charlane Ferretti, MD   4 months ago Type 2 diabetes mellitus with other circulatory complication, without long-term current use of insulin [facility])   [place], Dionne Bucy, [place]       Future Appointments             In 1 month Donato Heinz, MD [place] [facility]   In 1 month Charlott Rakes, MD [place]

## 2022-09-05 ENCOUNTER — Other Ambulatory Visit: Payer: Self-pay

## 2022-09-05 MED ORDER — OZEMPIC (0.25 OR 0.5 MG/DOSE) 2 MG/3ML ~~LOC~~ SOPN
0.2500 mg | PEN_INJECTOR | SUBCUTANEOUS | 1 refills | Status: DC
  Filled 2022-09-05: qty 3, 28d supply, fill #0

## 2022-09-05 MED ORDER — RIVAROXABAN 20 MG PO TABS
20.0000 mg | ORAL_TABLET | Freq: Every day | ORAL | 5 refills | Status: DC
  Filled 2022-09-05 – 2022-09-12 (×2): qty 30, 30d supply, fill #0

## 2022-09-05 MED ORDER — LOSARTAN POTASSIUM 25 MG PO TABS
12.5000 mg | ORAL_TABLET | Freq: Every day | ORAL | 7 refills | Status: DC
  Filled 2022-09-05 – 2022-09-12 (×2): qty 15, 30d supply, fill #0
  Filled 2022-10-08: qty 15, 30d supply, fill #1
  Filled 2022-11-25 – 2022-12-02 (×2): qty 15, 30d supply, fill #2

## 2022-09-05 MED FILL — Albuterol Sulfate Inhal Aero 108 MCG/ACT (90MCG Base Equiv): RESPIRATORY_TRACT | 25 days supply | Qty: 18 | Fill #0 | Status: CN

## 2022-09-05 MED FILL — Insulin Pen Needle 31 G X 5 MM (1/5" or 3/16"): 50 days supply | Qty: 100 | Fill #0 | Status: CN

## 2022-09-05 MED FILL — Famotidine Tab 20 MG: ORAL | 30 days supply | Qty: 60 | Fill #0 | Status: CN

## 2022-09-08 ENCOUNTER — Other Ambulatory Visit: Payer: Self-pay

## 2022-09-09 ENCOUNTER — Other Ambulatory Visit: Payer: Self-pay

## 2022-09-10 ENCOUNTER — Other Ambulatory Visit: Payer: Self-pay

## 2022-09-12 ENCOUNTER — Other Ambulatory Visit: Payer: Self-pay

## 2022-09-12 MED FILL — Insulin Pen Needle 31 G X 5 MM (1/5" or 3/16"): 50 days supply | Qty: 100 | Fill #0 | Status: CN

## 2022-09-12 MED FILL — Famotidine Tab 20 MG: ORAL | 30 days supply | Qty: 60 | Fill #0 | Status: AC

## 2022-09-12 MED FILL — Albuterol Sulfate Inhal Aero 108 MCG/ACT (90MCG Base Equiv): RESPIRATORY_TRACT | 25 days supply | Qty: 18 | Fill #0 | Status: AC
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
## 2022-10-07 ENCOUNTER — Ambulatory Visit: Payer: No Typology Code available for payment source | Attending: General Practice | Admitting: Cardiology

## 2022-10-07 ENCOUNTER — Encounter: Payer: Self-pay | Admitting: Cardiology

## 2022-10-07 VITALS — BP 122/60 | HR 79 | Ht 65.0 in | Wt 209.0 lb

## 2022-10-07 DIAGNOSIS — E119 Type 2 diabetes mellitus without complications: Secondary | ICD-10-CM

## 2022-10-07 DIAGNOSIS — Z9861 Coronary angioplasty status: Secondary | ICD-10-CM

## 2022-10-07 DIAGNOSIS — I824Z2 Acute embolism and thrombosis of unspecified deep veins of left distal lower extremity: Secondary | ICD-10-CM

## 2022-10-07 DIAGNOSIS — I2510 Atherosclerotic heart disease of native coronary artery without angina pectoris: Secondary | ICD-10-CM | POA: Diagnosis not present

## 2022-10-07 DIAGNOSIS — Z8673 Personal history of transient ischemic attack (TIA), and cerebral infarction without residual deficits: Secondary | ICD-10-CM | POA: Diagnosis not present

## 2022-10-07 DIAGNOSIS — E785 Hyperlipidemia, unspecified: Secondary | ICD-10-CM

## 2022-10-07 DIAGNOSIS — I10 Essential (primary) hypertension: Secondary | ICD-10-CM

## 2022-10-07 NOTE — Patient Instructions (Signed)
Medication Instructions:  STOP protonix  *If you need a refill on your cardiac medications before your next appointment, please call your pharmacy*   Lab Work: CMET, Lipid, CBC, A1C today  If you have labs (blood work) drawn today and your tests are completely normal, you will receive your results only by: MyChart Message (if you have MyChart) OR A paper copy in the mail If you have any lab test that is abnormal or we need to change your treatment, we will call you to review the results.  Follow-Up: At [facility], you and your health needs are our priority.  As part of our continuing mission to provide you with exceptional heart care, we have created designated Provider Care Teams.  These Care Teams include your primary Cardiologist (physician) and Advanced Practice Providers (APPs -  Physician Assistants and Nurse Practitioners) who all work together to provide you with the care you need, when you need it.  We recommend signing up for the patient portal called "MyChart".  Sign up information is provided on this After Visit Summary.  MyChart is used to connect with patients for Virtual Visits (Telemedicine).  Patients are able to view lab/test results, encounter notes, upcoming appointments, etc.  Non-urgent messages can be sent to your provider as well.   To learn more about what you can do with MyChart, go to [email or web address].    Your next appointment:   3 month(s)  The format for your next appointment:   In Person  Provider:   Edd Fabian, FNP or Azalee Course, PA-C    Then, Wender Ishikawa, MD will plan to see you again in 6 month(s).

## 2022-10-08 ENCOUNTER — Other Ambulatory Visit: Payer: Self-pay

## 2022-10-08 ENCOUNTER — Ambulatory Visit: Payer: No Typology Code available for payment source | Attending: Family Medicine | Admitting: Family Medicine

## 2022-10-08 ENCOUNTER — Encounter: Payer: Self-pay | Admitting: Family Medicine

## 2022-10-08 ENCOUNTER — Other Ambulatory Visit: Payer: Self-pay | Admitting: Internal Medicine

## 2022-10-08 VITALS — BP 165/96 | HR 68 | Temp 98.0°F | Ht 67.0 in

## 2022-10-08 DIAGNOSIS — L03213 Periorbital cellulitis: Secondary | ICD-10-CM

## 2022-10-08 DIAGNOSIS — R600 Localized edema: Secondary | ICD-10-CM

## 2022-10-08 DIAGNOSIS — I69354 Hemiplegia and hemiparesis following cerebral infarction affecting left non-dominant side: Secondary | ICD-10-CM

## 2022-10-08 DIAGNOSIS — E1159 Type 2 diabetes mellitus with other circulatory complications: Secondary | ICD-10-CM | POA: Diagnosis not present

## 2022-10-08 DIAGNOSIS — I6930 Unspecified sequelae of cerebral infarction: Secondary | ICD-10-CM | POA: Diagnosis not present

## 2022-10-08 DIAGNOSIS — Z029 Encounter for administrative examinations, unspecified: Secondary | ICD-10-CM

## 2022-10-08 DIAGNOSIS — I82401 Acute embolism and thrombosis of unspecified deep veins of right lower extremity: Secondary | ICD-10-CM

## 2022-10-08 DIAGNOSIS — I152 Hypertension secondary to endocrine disorders: Secondary | ICD-10-CM

## 2022-10-08 LAB — POCT GLYCOSYLATED HEMOGLOBIN (HGB A1C): HbA1c, POC (controlled diabetic range): 11.1 % — AB (ref 0.0–7.0)

## 2022-10-08 LAB — GLUCOSE, POCT (MANUAL RESULT ENTRY): POC Glucose: 257 mg/dl — AB (ref 70–99)

## 2022-10-08 MED ORDER — SULFAMETHOXAZOLE-TRIMETHOPRIM 800-160 MG PO TABS
1.0000 | ORAL_TABLET | Freq: Two times a day (BID) | ORAL | 0 refills | Status: DC
  Filled 2022-10-08: qty 20, 10d supply, fill #0

## 2022-10-08 MED ORDER — SEMAGLUTIDE(0.25 OR 0.5MG/DOS) 2 MG/3ML ~~LOC~~ SOPN
0.5000 mg | PEN_INJECTOR | SUBCUTANEOUS | 1 refills | Status: DC
  Filled 2022-10-08: qty 3, 28d supply, fill #0
  Filled 2022-10-08: qty 3, fill #0
  Filled 2022-12-02: qty 3, 28d supply, fill #1

## 2022-10-08 MED ORDER — AMOXICILLIN-POT CLAVULANATE 875-125 MG PO TABS
1.0000 | ORAL_TABLET | Freq: Two times a day (BID) | ORAL | 0 refills | Status: DC
  Filled 2022-10-08: qty 20, 10d supply, fill #0

## 2022-10-08 MED ORDER — OZEMPIC (0.25 OR 0.5 MG/DOSE) 2 MG/3ML ~~LOC~~ SOPN
0.2500 mg | PEN_INJECTOR | SUBCUTANEOUS | 0 refills | Status: DC
  Filled 2022-10-08: qty 3, 28d supply, fill #0

## 2022-10-08 MED FILL — Famotidine Tab 20 MG: ORAL | 30 days supply | Qty: 60 | Fill #1 | Status: AC
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
## 2022-10-08 NOTE — Progress Notes (Signed)
Left eye red and swollen Discuss letter for work Numbness on left side.

## 2022-10-08 NOTE — Patient Instructions (Signed)
Preseptal Cellulitis, Adult Preseptal cellulitis is an infection of the eyelid and the tissues around the eye (periorbital area). The infection causes painful swelling and redness. This condition may also be called periorbital cellulitis. In most cases, the condition can be treated with antibiotic medicine at home. It is important to treat preseptal cellulitis right away so that it does not get worse. If it gets worse, it can spread to the eye socket and eye muscles (orbital cellulitis). Orbital cellulitis is a medical emergency. What are the causes? Preseptal cellulitis is most commonly caused by bacteria. In rare cases, it can be caused by a virus or fungus. The germs that cause preseptal cellulitis may come from: A sinus infection that spreads near the eyes. An injury near the eye, such as a scratch, puncture wound, animal bite, or insect bite. A skin rash, such as eczema or poison ivy, that becomes infected. An infected pimple on the eyelid (stye). Infection after eyelid surgery or injury. What increases the risk? You are more likely to develop this condition if: You have a weakened disease-fighting system (immune system). You have a medical condition that raises your risk for sinus infections, such as nasal polyps. What are the signs or symptoms? Symptoms of this condition include: Eyelids that are red and swollen and feel unusually hot. Fever. Difficulty opening the eye. Headache. Pain in the face. Symptoms of this condition usually develop suddenly. How is this diagnosed? This condition may be diagnosed based on your symptoms, your medical history, and an eye exam. You may also have tests, such as: Blood tests. Tests (cultures) to find out which specific bacteria are causing the infection. You may have a culture of any open wound or drainage. CT scan. MRI. This is less common. How is this treated? This condition is treated with antibiotic medicines. These may be given by mouth  (orally), through an IV, or as an injection. In rare cases, you may need surgery to drain an infected area. Follow these instructions at home: Medicines Take your antibiotic medicine as told by your health care provider. Do not stop taking the antibiotic even if you start to feel better Take over-the-counter and prescription medicines only as told by your health care provider. Eye Care Do not use eye drops without first getting approval from your health care provider. Do not touch or rub your eye. If you wear contact lenses, do not wear them until your health care provider approves. Keep the eye area clean and dry. Wash the eye area with a clean washcloth, warm water, and baby shampoo or mild soap. To help relieve discomfort, place a clean washcloth that is wet with warm water over your eye. Leave the washcloth on for a few minutes, then remove it. General instructions Wash your hands with soap and water often for at least 20 seconds. If soap and water are not available, use hand sanitizer. Do not use any products that contain nicotine or tobacco, such as cigarettes, e-cigarettes, and chewing tobacco. If you need help quitting, ask your health care provider. Drink enough fluid to keep your urine pale yellow. Do not drive or operate machinery until your health care provider says that it is safe. Ask your health care provider if it is safe for you to drive. Stay up to date on your vaccinations. Keep all follow-up visits. This includes any visits with an eye specialist (ophthalmologist) or dentist. This is important. Get help right away if: You have new symptoms. Your symptoms get worse or   do not get better with treatment. You have a fever. Your vision becomes blurry or gets worse in any way. Your eye looks like it is sticking out or bulging out (proptosis). You develop double vision. You have trouble moving your eyes or pain when moving your eyes You have a severe headache. You have neck  stiffness or severe neck pain. These symptoms may represent a serious problem that is an emergency. Do not wait to see if the symptoms will go away. Get medical help right away. Call your local emergency services (911 in the [place] Do not drive yourself to the hospital. Summary Preseptal cellulitis is an infection of the eyelid and the tissues around the eye. Symptoms of preseptal cellulitis usually develop suddenly and include red and swollen eyelids, fever, difficulty opening the eye, headache, and facial pain. This condition is treated with antibiotic medicines. Do not stop taking the antibiotic even if you start to feel better. Preseptal cellulitis can develop into orbital cellulitis, which is a medical emergency. If your condition does not improve or worsens, visit your heath care provider right away. This information is not intended to replace advice given to you by your health care provider. Make sure you discuss any questions you have with your health care provider. Document Revised: 02/22/2020 Document Reviewed: 02/22/2020 Elsevier Patient Education  2023 Elsevier Inc.  

## 2022-10-08 NOTE — Progress Notes (Signed)
   10/08/22 6381  SDOH Transportation Screening (Complete 2 screening rows OR Exclusion row)  In the past 12 months, has lack of transportation kept you from medical appointments or from getting medications? yes  In the past 12 months, has lack of transportation kept you from meetings, work, or from getting things needed for daily living? Yes  AMBULATORY ONLY: Non-Emergency Transportation Screening  1.  Is the Patient in the clinic and in need of immediate transportation? No  2. Can the visit be virtual? No  SDOH Interventions  Transportation Interventions Taxi Voucher Given

## 2022-10-13 ENCOUNTER — Other Ambulatory Visit: Payer: Self-pay

## 2022-10-13 MED ORDER — FUROSEMIDE 20 MG PO TABS
ORAL_TABLET | ORAL | 0 refills | Status: DC
  Filled 2022-10-13: qty 30, 10d supply, fill #0
  Filled 2022-11-25 – 2022-12-02 (×2): qty 30, 30d supply, fill #0

## 2022-10-14 LAB — PROTEIN S, TOTAL AND FREE
Protein S Ag, Free: 111 % (ref 61–136)
Protein S Ag, Total: 109 % (ref 60–150)

## 2022-10-14 LAB — PROTEIN C, TOTAL: Protein C Antigen: 127 % (ref 60–150)

## 2022-10-14 LAB — HOMOCYSTEINE: Homocysteine: 6.7 umol/L (ref 0.0–14.5)

## 2022-10-14 LAB — CMP14+EGFR
ALT: 13 IU/L (ref 0–32)
AST: 13 IU/L (ref 0–40)
Albumin/Globulin Ratio: 2.2 (ref 1.2–2.2)
Albumin: 3.7 g/dL — ABNORMAL LOW (ref 3.8–4.9)
Alkaline Phosphatase: 61 IU/L (ref 44–121)
BUN/Creatinine Ratio: 9 (ref 9–23)
BUN: 6 mg/dL (ref 6–24)
Bilirubin Total: 0.2 mg/dL (ref 0.0–1.2)
CO2: 23 mmol/L (ref 20–29)
Calcium: 8.8 mg/dL (ref 8.7–10.2)
Chloride: 105 mmol/L (ref 96–106)
Creatinine, Ser: 0.67 mg/dL (ref 0.57–1.00)
Globulin, Total: 1.7 g/dL (ref 1.5–4.5)
Glucose: 271 mg/dL — ABNORMAL HIGH (ref 70–99)
Potassium: 4.2 mmol/L (ref 3.5–5.2)
Sodium: 141 mmol/L (ref 134–144)
Total Protein: 5.4 g/dL — ABNORMAL LOW (ref 6.0–8.5)
eGFR: 101 mL/min/{1.73_m2} (ref >59)

## 2022-10-14 LAB — LP+NON-HDL CHOLESTEROL
Cholesterol, Total: 149 mg/dL (ref 100–199)
HDL: 47 mg/dL (ref >39)
LDL Chol Calc (NIH): 86 mg/dL (ref 0–99)
Total Non-HDL-Chol (LDL+VLDL): 102 mg/dL (ref 0–129)
Triglycerides: 84 mg/dL (ref 0–149)
VLDL Cholesterol Cal: 16 mg/dL (ref 5–40)

## 2022-10-14 LAB — CBC WITH DIFFERENTIAL/PLATELET
Basophils Absolute: 0 10*3/uL (ref 0.0–0.2)
Basos: 1 %
EOS (ABSOLUTE): 0.1 10*3/uL (ref 0.0–0.4)
Eos: 1 %
Hematocrit: 38.3 % (ref 34.0–46.6)
Hemoglobin: 12.7 g/dL (ref 11.1–15.9)
Immature Grans (Abs): 0 10*3/uL (ref 0.0–0.1)
Immature Granulocytes: 0 %
Lymphocytes Absolute: 1.7 10*3/uL (ref 0.7–3.1)
Lymphs: 26 %
MCH: 30.8 pg (ref 26.6–33.0)
MCHC: 33.2 g/dL (ref 31.5–35.7)
MCV: 93 fL (ref 79–97)
Monocytes Absolute: 0.4 10*3/uL (ref 0.1–0.9)
Monocytes: 6 %
Neutrophils Absolute: 4.4 10*3/uL (ref 1.4–7.0)
Neutrophils: 66 %
Platelets: 222 10*3/uL (ref 150–450)
RBC: 4.13 x10E6/uL (ref 3.77–5.28)
RDW: 12.8 % (ref 11.7–15.4)
WBC: 6.6 10*3/uL (ref 3.4–10.8)

## 2022-10-14 LAB — FACTOR V LEIDEN

## 2022-10-14 LAB — LUPUS ANTICOAGULANT PANEL
Dilute Viper Venom Time: 31.3 s (ref 0.0–47.0)
PTT Lupus Anticoagulant: 26.2 s (ref 0.0–43.5)

## 2022-10-14 LAB — BETA 2 MICROGLOBULIN, SERUM: Beta-2: 1.4 mg/L (ref 0.6–2.4)

## 2022-10-14 LAB — FACTOR 2 ASSAY: Factor II Activity: 114 % (ref 50–154)

## 2022-10-17 ENCOUNTER — Other Ambulatory Visit: Payer: Self-pay

## 2022-11-12 ENCOUNTER — Emergency Department (HOSPITAL_COMMUNITY): Payer: 59

## 2022-11-12 ENCOUNTER — Emergency Department (HOSPITAL_COMMUNITY)
Admission: EM | Admit: 2022-11-12 | Discharge: 2022-11-13 | Disposition: A | Discharge status: Home / Self Care | Payer: 59 | Attending: Emergency Medicine | Admitting: Emergency Medicine

## 2022-11-12 ENCOUNTER — Other Ambulatory Visit: Payer: Self-pay

## 2022-11-12 DIAGNOSIS — I10 Essential (primary) hypertension: Secondary | ICD-10-CM | POA: Diagnosis not present

## 2022-11-12 DIAGNOSIS — Z794 Long term (current) use of insulin: Secondary | ICD-10-CM | POA: Insufficient documentation

## 2022-11-12 DIAGNOSIS — E1165 Type 2 diabetes mellitus with hyperglycemia: Secondary | ICD-10-CM | POA: Insufficient documentation

## 2022-11-12 DIAGNOSIS — Z79899 Other long term (current) drug therapy: Secondary | ICD-10-CM | POA: Insufficient documentation

## 2022-11-12 DIAGNOSIS — Z7902 Long term (current) use of antithrombotics/antiplatelets: Secondary | ICD-10-CM | POA: Insufficient documentation

## 2022-11-12 DIAGNOSIS — R0789 Other chest pain: Secondary | ICD-10-CM | POA: Diagnosis not present

## 2022-11-12 DIAGNOSIS — R739 Hyperglycemia, unspecified: Secondary | ICD-10-CM

## 2022-11-12 DIAGNOSIS — Z1152 Encounter for screening for COVID-19: Secondary | ICD-10-CM | POA: Insufficient documentation

## 2022-11-12 DIAGNOSIS — R457 State of emotional shock and stress, unspecified: Secondary | ICD-10-CM | POA: Diagnosis not present

## 2022-11-12 DIAGNOSIS — I2510 Atherosclerotic heart disease of native coronary artery without angina pectoris: Secondary | ICD-10-CM | POA: Insufficient documentation

## 2022-11-12 DIAGNOSIS — Z7984 Long term (current) use of oral hypoglycemic drugs: Secondary | ICD-10-CM | POA: Insufficient documentation

## 2022-11-12 DIAGNOSIS — R079 Chest pain, unspecified: Secondary | ICD-10-CM | POA: Insufficient documentation

## 2022-11-12 DIAGNOSIS — Z743 Need for continuous supervision: Secondary | ICD-10-CM | POA: Diagnosis not present

## 2022-11-12 LAB — CBC WITH DIFFERENTIAL/PLATELET
Abs Immature Granulocytes: 0.03 10*3/uL (ref 0.00–0.07)
Basophils Absolute: 0.1 10*3/uL (ref 0.0–0.1)
Basophils Relative: 1 %
Eosinophils Absolute: 0.1 10*3/uL (ref 0.0–0.5)
Eosinophils Relative: 1 %
HCT: 40.5 % (ref 36.0–46.0)
Hemoglobin: 14 g/dL (ref 12.0–15.0)
Immature Granulocytes: 0 %
Lymphocytes Relative: 25 %
Lymphs Abs: 2.2 10*3/uL (ref 0.7–4.0)
MCH: 32.4 pg (ref 26.0–34.0)
MCHC: 34.6 g/dL (ref 30.0–36.0)
MCV: 93.8 fL (ref 80.0–100.0)
Monocytes Absolute: 0.6 10*3/uL (ref 0.1–1.0)
Monocytes Relative: 6 %
Neutro Abs: 5.9 10*3/uL (ref 1.7–7.7)
Neutrophils Relative %: 67 %
Platelets: 240 10*3/uL (ref 150–400)
RBC: 4.32 MIL/uL (ref 3.87–5.11)
RDW: 13.3 % (ref 11.5–15.5)
WBC: 8.8 10*3/uL (ref 4.0–10.5)
nRBC: 0 % (ref 0.0–0.2)

## 2022-11-12 LAB — URINALYSIS, ROUTINE W REFLEX MICROSCOPIC
Bacteria, UA: NONE SEEN
Bilirubin Urine: NEGATIVE
Glucose, UA: >=500 mg/dL — AB
Hgb urine dipstick: NEGATIVE
Ketones, ur: 20 mg/dL — AB
Leukocytes,Ua: NEGATIVE
Nitrite: NEGATIVE
Protein, ur: NEGATIVE mg/dL
Specific Gravity, Urine: 1.029 (ref 1.005–1.030)
pH: 6 (ref 5.0–8.0)

## 2022-11-12 LAB — BETA-HYDROXYBUTYRIC ACID: Beta-Hydroxybutyric Acid: 0.24 mmol/L (ref 0.05–0.27)

## 2022-11-12 NOTE — ED Triage Notes (Addendum)
EMS report: Pt BIB [facility] d/t CP and high blood sugar from home. States intermittent chest pain for past 2 days. Has been taking several doses of NTG throughout these past few days with improvement of CP. Initial 200/110. Took home NTG x2 prior to transport. No ASA. EKG elevation V1, HX MI. CBG 439, took home insulin prior to transport. 18G RAC EMS VS HR 100 BP 160/90 96% RA RR 16

## 2022-11-12 NOTE — ED Triage Notes (Signed)
Pt endorses centralized chest pain describe as throbbing rating 7/10. Pain associated with SOB and nausea. Pain similar to previous MI. States CBG elevated, higher than normal. Endorses compliance with mediations.

## 2022-11-12 NOTE — ED Provider Triage Note (Signed)
Emergency Medicine Provider Triage Evaluation Note  [name] , a 57 y.o. female  was evaluated in triage.  Pt complains of hyperglycemia and chest pain.  Sugars today running in the 400's which is higher than usual for her.  Also having chest pain that began tonight, making her feel nauseated.  Hx of MI x2 w/stents.  Followed by cardiology, Dr. Gardiner Rhyme.  Review of Systems  Positive: Hyperglycemia, chest pain Negative: fever  Physical Exam  BP (!) 159/89 (BP Location: Right Arm)   Pulse 98   Temp 98.4 F (36.9 C) (Oral)   Resp 16   Ht 5\' 7"  (1.702 m)   Wt 94.8 kg   LMP 01/24/2015 (Approximate)   SpO2 98%   BMI 32.73 kg/m   Gen:   Awake, no distress   Resp:  Normal effort  MSK:   Moves extremities without difficulty  Other:    Medical Decision Making  Medically screening exam initiated at 10:50 PM.  Appropriate orders placed.  [name] was informed that the remainder of the evaluation will be completed by another provider, this initial triage assessment does not replace that evaluation, and the importance of remaining in the ED until their evaluation is complete.  Hyperglycemia, CBG 439 with EMS.  Also reports chest pain, hx MI with stent.  EKG, labs, CXR.   Sandra Pickett, PA-C 11/12/22 2255

## 2022-11-13 ENCOUNTER — Emergency Department (HOSPITAL_COMMUNITY): Payer: 59

## 2022-11-13 LAB — TROPONIN I (HIGH SENSITIVITY)
Troponin I (High Sensitivity): 17 ng/L
Troponin I (High Sensitivity): 18 ng/L — ABNORMAL HIGH

## 2022-11-13 LAB — COMPREHENSIVE METABOLIC PANEL
ALT: 22 U/L (ref 0–44)
AST: 25 U/L (ref 15–41)
Albumin: 3.2 g/dL — ABNORMAL LOW (ref 3.5–5.0)
Alkaline Phosphatase: 68 U/L (ref 38–126)
Anion gap: 8 (ref 5–15)
BUN: 16 mg/dL (ref 6–20)
CO2: 23 mmol/L (ref 22–32)
Calcium: 8.8 mg/dL — ABNORMAL LOW (ref 8.9–10.3)
Chloride: 105 mmol/L (ref 98–111)
Creatinine, Ser: 0.82 mg/dL (ref 0.44–1.00)
GFR, Estimated: >60 mL/min (ref >60)
Glucose, Bld: 408 mg/dL — ABNORMAL HIGH (ref 70–99)
Potassium: 3.9 mmol/L (ref 3.5–5.1)
Sodium: 136 mmol/L (ref 135–145)
Total Bilirubin: 0.7 mg/dL (ref 0.3–1.2)
Total Protein: 5.6 g/dL — ABNORMAL LOW (ref 6.5–8.1)

## 2022-11-13 LAB — I-STAT VENOUS BLOOD GAS, ED
Acid-Base Excess: 0 mmol/L (ref 0.0–2.0)
Bicarbonate: 24 mmol/L (ref 20.0–28.0)
Calcium, Ion: 1.14 mmol/L — ABNORMAL LOW (ref 1.15–1.40)
HCT: 41 % (ref 36.0–46.0)
Hemoglobin: 13.9 g/dL (ref 12.0–15.0)
O2 Saturation: 98 %
Potassium: 3.9 mmol/L (ref 3.5–5.1)
Sodium: 137 mmol/L (ref 135–145)
TCO2: 25 mmol/L (ref 22–32)
pCO2, Ven: 37.7 mmHg — ABNORMAL LOW (ref 44–60)
pH, Ven: 7.412 (ref 7.25–7.43)
pO2, Ven: 96 mmHg — ABNORMAL HIGH (ref 32–45)

## 2022-11-13 LAB — RESP PANEL BY RT-PCR (RSV, FLU A&B, COVID)  RVPGX2
Influenza A by PCR: NEGATIVE
Influenza B by PCR: NEGATIVE
Resp Syncytial Virus by PCR: NEGATIVE
SARS Coronavirus 2 by RT PCR: NEGATIVE

## 2022-11-13 LAB — CBG MONITORING, ED: Glucose-Capillary: 358 mg/dL — ABNORMAL HIGH (ref 70–99)

## 2022-11-13 MED ORDER — SODIUM CHLORIDE 0.9 % IV BOLUS
1000.0000 mL | Freq: Once | INTRAVENOUS | Status: AC
  Administered 2022-11-13: 1000 mL via INTRAVENOUS

## 2022-11-13 MED ORDER — IOHEXOL 350 MG/ML SOLN
75.0000 mL | Freq: Once | INTRAVENOUS | Status: AC | PRN
  Administered 2022-11-13: 75 mL via INTRAVENOUS
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
## 2022-11-13 NOTE — ED Provider Notes (Signed)
[facility] Emergency Department Provider Note MRN:  [id number]  Arrival date & time: 11/13/22     Chief Complaint   Chest Pain and Hyperglycemia   History of Present Illness   [name] is a 57 y.o. year-old female presents to the ED with chief complaint of chest pain.  She describes it as a burning sensation in her chest.  States that it feels somewhat similar to prior MIs.  She has had stents in the past.  She states that her blood sugar has been running high.  She states that the pain in her chest feels like a burning.  History provided by patient.   Review of Systems  Pertinent positive and negative review of systems noted in HPI.    Physical Exam   Vitals:   11/13/22 0500 11/13/22 0530  BP: 106/77 132/76  Pulse: 80 98  Resp: 16 17  Temp:    SpO2: 96% 90%    CONSTITUTIONAL:  non toxic-appearing, NAD NEURO:  Alert and oriented x 3, CN 3-12 grossly intact EYES:  eyes equal and reactive ENT/NECK:  Supple, no stridor  CARDIO:  normal rate, regular rhythm, appears well-perfused  PULM:  No respiratory distress,  GI/GU:  non-distended,  MSK/SPINE:  No gross deformities, no edema, moves all extremities  SKIN:  no rash, atraumatic   *Additional and/or pertinent findings included in MDM below  Diagnostic and Interventional Summary    EKG Interpretation  Date/Time:  Wednesday November 12 2022 23:45:58 EST Ventricular Rate:  86 PR Interval:  162 QRS Duration: 98 QT Interval:  390 QTC Calculation: 466 R Axis:   -40 Text Interpretation: Normal sinus rhythm Left axis deviation Moderate voltage criteria for LVH, may be normal variant ( R in aVL , Cornell product ) Septal infarct , age undetermined Abnormal ECG When compared with ECG of 16-Jun-2022 02:46, No significant change was found Confirmed by Orpah Greek [phone]) on 11/13/2022 12:27:24 AM       Labs Reviewed  COMPREHENSIVE METABOLIC PANEL - Abnormal; Notable for the  following components:      Result Value   Glucose, Bld 408 (*)    Calcium 8.8 (*)    Total Protein 5.6 (*)    Albumin 3.2 (*)    All other components within normal limits  URINALYSIS, ROUTINE W REFLEX MICROSCOPIC - Abnormal; Notable for the following components:   Color, Urine STRAW (*)    Glucose, UA >=500 (*)    Ketones, ur 20 (*)    All other components within normal limits  I-STAT VENOUS BLOOD GAS, ED - Abnormal; Notable for the following components:   pCO2, Ven 37.7 (*)    pO2, Ven 96 (*)    Calcium, Ion 1.14 (*)    All other components within normal limits  CBG MONITORING, ED - Abnormal; Notable for the following components:   Glucose-Capillary 358 (*)    All other components within normal limits  TROPONIN I (HIGH SENSITIVITY) - Abnormal; Notable for the following components:   Troponin I (High Sensitivity) 18 (*)    All other components within normal limits  RESP PANEL BY RT-PCR (RSV, FLU A&B, COVID)  [id number]  CBC WITH DIFFERENTIAL/PLATELET  BETA-HYDROXYBUTYRIC ACID  TROPONIN I (HIGH SENSITIVITY)    CT Angio Chest PE W and/or Wo Contrast  Final Result    DG Chest 2 View  Final Result      Medications  sodium chloride 0.9 % bolus 1,000 mL (0 mLs Intravenous Stopped  11/13/22 0231)  iohexol (OMNIPAQUE) 350 MG/ML injection 75 mL (75 mLs Intravenous Contrast Given 11/13/22 0159)     Procedures  /  Critical Care Procedures  ED Course and Medical Decision Making  I have reviewed the triage vital signs, the nursing notes, and pertinent available records from the EMR.  Social Determinants Affecting Complexity of Care: Patient has no clinically significant social determinants affecting this chief complaint..   ED Course:    Medical Decision Making Patient here with chest pain.  Hx of prior MIs with stents.  Will check labs and imaging.   Initial trop is 17.  Will check repeat.    Repeat trop is 18.  Symptoms seem to be more GERD related, but patient says that this  feels similar to prior MIs.  Will consult cardiology.  Glucose down trending.  Cardiology has seen the patient and says that patient can be discharged with close follow-up.    Amount and/or Complexity of Data Reviewed Labs: ordered.    Details: First trop 17, repeat 45 COVID and flu negative Radiology: ordered and independent interpretation performed.    Details: No opacity seen on CXR  Risk Prescription drug management.     Consultants: I discussed the case with Dr. Foye Clock, who is appreciated for consulting.  He has seen the patient and feels that she is stable for discharge and outpatient follow-up.   Treatment and Plan: I considered admission due to patient's initial presentation, but after considering the examination and diagnostic results, patient will not require admission and can be discharged with outpatient follow-up.    Final Clinical Impressions(s) / ED Diagnoses     ICD-10-CM   1. Chest pain, unspecified type  R07.9 Ambulatory referral to Cardiology    2. Hyperglycemia  R73.9       ED Discharge Orders          Ordered    Ambulatory referral to Cardiology       Comments: If you have not heard from the Cardiology office within the next 72 hours please call [phone].   11/13/22 0600              Discharge Instructions Discussed with and Provided to Patient:   Discharge Instructions   None      Montine Circle, PA-C 11/13/22 0601    Orpah Greek, MD 11/13/22 [phone]

## 2022-11-14 ENCOUNTER — Ambulatory Visit: Payer: No Typology Code available for payment source | Admitting: Pharmacist

## 2022-11-21 ENCOUNTER — Telehealth: Payer: Self-pay | Admitting: Family Medicine

## 2022-11-24 ENCOUNTER — Ambulatory Visit: Payer: Medicaid Other | Attending: Family Medicine | Admitting: Family Medicine

## 2022-11-24 NOTE — Telephone Encounter (Signed)
Noted pt appointment for 11/24/2022 has been changed to a virtual appointment.

## 2022-11-24 NOTE — Telephone Encounter (Signed)
Patient called in on 1/10 inquiring about transportation for 11/24/2022 appointment. Patient has not received a follow up call.

## 2022-11-24 NOTE — Telephone Encounter (Signed)
Copied from [place] [id number]. Topic: Transportation - Transportation >> Nov 12, 2022  4:57 PM [place] B wrote: Reason for CRM: Patient called in needs transportation for 2 appts , okne on 01/12 and the other, 01/22

## 2022-11-25 ENCOUNTER — Other Ambulatory Visit: Payer: Self-pay

## 2022-11-25 ENCOUNTER — Ambulatory Visit: Payer: Self-pay

## 2022-11-25 MED FILL — Famotidine Tab 20 MG: ORAL | 30 days supply | Qty: 60 | Fill #2 | Status: CN

## 2022-11-25 NOTE — Telephone Encounter (Signed)
  Chief Complaint: hair loss Symptoms: hair loss, bald spots from top of head all way to L side, HA Frequency: since last OV  Pertinent Negatives: Patient denies scalp pain or redness or sores  Disposition: [] ED /[] Urgent Care (no appt availability in office) / [] Appointment(In office/virtual)/ []  [facility]/ [] Home Care/ [] Refused Recommended Disposition /[]  Mobile Bus/ [x]  Follow-up with PCP Additional Notes: pt states she missed called from Dr. Margarita Rana yesterday for visit. Advised her I would send message back to see if she can be rescheduled d/t no appts available until 12/24/22 with Levada Dy, [place]. Pt verbalized understanding. Best CB # [phone].   Summary: loosing hair   Pt stated she had an appointment with Dr.Newlin yesterday and she missed her call. Pt stated she wanted to speak with her because she is losing her hair on the left side, and in the middle, she has bald spots. Pt stated she is unsure of what is going on or if this is due to her medication.  Pt seeking clinical advice.         Reason for Disposition  [1] Hair thinning, hair loss, or balding AND [2] cause not known (e.g., no recent precipitating factors such as childbirth, weight loss surgery)  Answer Assessment - Initial Assessment Questions 1. LOCATION: "Where is the hair loss?" (e.g., all of scalp, parts of scalp, back of head or neck)     L side of head and in the middle on top  2. DESCRIPTION: "Please describe it.? (e.g., thinning of hair, balding, patches of hair missing)     Bald spots  3. ONSET: "When did the hair loss begin?" (e.g., sudden or gradual onset; days, weeks, months or years ago)     Since last OV  4. OTHER SYMPTOMS: "What does the scalp look like where the hair is missing?" (e.g., normal, redness, crusts, scarring)     no 5. OTHER FACTORS: "Have you had any of the following recently: childbirth, severe illness or injury, major surgery, major weight loss, cancer chemo, tight  hair braids, serious stress?"     HA  Protocols used: Hair Loss-A-AH

## 2022-11-25 NOTE — Telephone Encounter (Signed)
Pt called on # she called in on and "call cannot be completed as dialed"... called pt on main # in chart and VM is full so unable to LVM.   Summary: loosing hair   Pt stated she had an appointment with Dr.Newlin yesterday and she missed her call. Pt stated she wanted to speak with her because she is losing her hair on the left side, and in the middle, she has bald spots. Pt stated she is unsure of what is going on or if this is due to her medication.  Pt seeking clinical advice.

## 2022-12-01 ENCOUNTER — Other Ambulatory Visit: Payer: Self-pay

## 2022-12-02 ENCOUNTER — Other Ambulatory Visit: Payer: Self-pay

## 2022-12-02 MED FILL — Famotidine Tab 20 MG: ORAL | 30 days supply | Qty: 60 | Fill #2 | Status: AC

## 2022-12-03 ENCOUNTER — Ambulatory Visit: Payer: Medicaid Other | Admitting: Nurse Practitioner

## 2022-12-03 ENCOUNTER — Other Ambulatory Visit: Payer: Self-pay
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
## 2022-12-04 ENCOUNTER — Other Ambulatory Visit: Payer: Self-pay
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
## 2022-12-08 ENCOUNTER — Encounter: Payer: Self-pay | Admitting: General Practice

## 2022-12-08 ENCOUNTER — Ambulatory Visit: Payer: 59 | Attending: Nurse Practitioner | Admitting: General Practice

## 2022-12-08 VITALS — BP 92/65 | HR 72 | Ht 65.0 in | Wt 209.0 lb

## 2022-12-08 DIAGNOSIS — I10 Essential (primary) hypertension: Secondary | ICD-10-CM | POA: Diagnosis not present

## 2022-12-08 DIAGNOSIS — E785 Hyperlipidemia, unspecified: Secondary | ICD-10-CM | POA: Diagnosis not present

## 2022-12-08 DIAGNOSIS — E119 Type 2 diabetes mellitus without complications: Secondary | ICD-10-CM | POA: Insufficient documentation

## 2022-12-08 DIAGNOSIS — Z8673 Personal history of transient ischemic attack (TIA), and cerebral infarction without residual deficits: Secondary | ICD-10-CM | POA: Diagnosis not present

## 2022-12-08 DIAGNOSIS — Z794 Long term (current) use of insulin: Secondary | ICD-10-CM

## 2022-12-08 DIAGNOSIS — I2510 Atherosclerotic heart disease of native coronary artery without angina pectoris: Secondary | ICD-10-CM | POA: Diagnosis not present

## 2022-12-08 NOTE — Patient Instructions (Signed)
Medication Instructions:  Your physician has recommended you make the following change in your medication:   -Decrease losartan (cozaar) to 50mg  once daily.  *If you need a refill on your cardiac medications before your next appointment, please call your pharmacy*   Follow-Up: At [facility], you and your health needs are our priority.  As part of our continuing mission to provide you with exceptional heart care, we have created designated Provider Care Teams.  These Care Teams include your primary Cardiologist (physician) and Advanced Practice Providers (APPs -  Physician Assistants and Nurse Practitioners) who all work together to provide you with the care you need, when you need it.  We recommend signing up for the patient portal called "MyChart".  Sign up information is provided on this After Visit Summary.  MyChart is used to connect with patients for Virtual Visits (Telemedicine).  Patients are able to view lab/test results, encounter notes, upcoming appointments, etc.  Non-urgent messages can be sent to your provider as well.   To learn more about what you can do with MyChart, go to [email or web address].    Your next appointment:   4 month(s)  Provider:   Coletta Memos, FNP or  Donato Heinz, MD    Other Instructions Please resume your Physical Therapy exercises. We recommend doing them 5-6 day a week.    [facility] [place]:

## 2022-12-10 ENCOUNTER — Other Ambulatory Visit: Payer: Self-pay

## 2023-01-02 ENCOUNTER — Other Ambulatory Visit: Payer: Self-pay

## 2023-01-02 ENCOUNTER — Other Ambulatory Visit: Payer: Self-pay | Admitting: Family Medicine

## 2023-01-02 DIAGNOSIS — R600 Localized edema: Secondary | ICD-10-CM

## 2023-01-02 DIAGNOSIS — F419 Anxiety disorder, unspecified: Secondary | ICD-10-CM

## 2023-01-02 DIAGNOSIS — I214 Non-ST elevation (NSTEMI) myocardial infarction: Secondary | ICD-10-CM

## 2023-01-02 MED ORDER — ATORVASTATIN CALCIUM 80 MG PO TABS
80.0000 mg | ORAL_TABLET | Freq: Every day | ORAL | 0 refills | Status: DC
  Filled 2023-01-02: qty 30, 30d supply, fill #0

## 2023-01-02 MED ORDER — DULOXETINE HCL 60 MG PO CPEP
60.0000 mg | ORAL_CAPSULE | Freq: Every day | ORAL | 0 refills | Status: DC
  Filled 2023-01-02: qty 30, 30d supply, fill #0

## 2023-01-02 NOTE — Telephone Encounter (Signed)
Pt is calling in to request an update on her medication. Pt asked if it was possible to have it ready today for pickup, as she needs it. Pt has an upcoming appointment 04/03.   Please advise.

## 2023-01-05 ENCOUNTER — Other Ambulatory Visit: Payer: Self-pay

## 2023-01-05 MED ORDER — FUROSEMIDE 20 MG PO TABS
20.0000 mg | ORAL_TABLET | Freq: Every day | ORAL | 0 refills | Status: DC | PRN
  Filled 2023-01-05 – 2023-01-30 (×3): qty 30, 30d supply, fill #0

## 2023-01-06 ENCOUNTER — Ambulatory Visit: Payer: No Typology Code available for payment source | Admitting: Physician Assistant

## 2023-01-12 ENCOUNTER — Other Ambulatory Visit: Payer: Self-pay

## 2023-01-19 ENCOUNTER — Other Ambulatory Visit: Payer: Self-pay | Admitting: Physician Assistant

## 2023-01-19 ENCOUNTER — Other Ambulatory Visit: Payer: Self-pay

## 2023-01-19 DIAGNOSIS — K920 Hematemesis: Secondary | ICD-10-CM

## 2023-01-19 MED ORDER — FAMOTIDINE 20 MG PO TABS
20.0000 mg | ORAL_TABLET | Freq: Two times a day (BID) | ORAL | 0 refills | Status: DC
  Filled 2023-01-19: qty 60, 30d supply, fill #0

## 2023-01-30 ENCOUNTER — Other Ambulatory Visit: Payer: Self-pay

## 2023-01-30 ENCOUNTER — Other Ambulatory Visit: Payer: Self-pay | Admitting: Family Medicine

## 2023-01-30 DIAGNOSIS — F32A Depression, unspecified: Secondary | ICD-10-CM

## 2023-01-30 DIAGNOSIS — I214 Non-ST elevation (NSTEMI) myocardial infarction: Secondary | ICD-10-CM

## 2023-01-30 MED ORDER — DULOXETINE HCL 60 MG PO CPEP
60.0000 mg | ORAL_CAPSULE | Freq: Every day | ORAL | 0 refills | Status: DC
  Filled 2023-01-30: qty 30, 30d supply, fill #0

## 2023-01-30 MED ORDER — ATORVASTATIN CALCIUM 80 MG PO TABS
80.0000 mg | ORAL_TABLET | Freq: Every day | ORAL | 0 refills | Status: DC
  Filled 2023-01-30: qty 30, 30d supply, fill #0

## 2023-01-30 NOTE — Telephone Encounter (Signed)
Requested Prescriptions  Pending Prescriptions Disp Refills   DULoxetine (CYMBALTA) 60 MG capsule 90 capsule 0    Sig: Take 1 capsule (60 mg total) by mouth daily.     Psychiatry: Antidepressants - SNRI - duloxetine Passed - 01/30/2023  8:08 AM      Passed - Cr in normal range and within 360 days    Creatinine, Ser  Date Value Ref Range Status  11/12/2022 0.82 0.44 - 1.00 mg/dL Final         Passed - eGFR is 30 or above and within 360 days    GFR calc Af Amer  Date Value Ref Range Status  10/30/2020 93 >59 mL/min/1.73 Final    Comment:    **In accordance with recommendations from the NKF-ASN Task force,**   [facility] is in the process of updating its eGFR calculation to the   2021 CKD-EPI creatinine equation that estimates kidney function   without a race variable.    GFR, Estimated  Date Value Ref Range Status  11/12/2022 >60 >60 mL/min Final    Comment:    (NOTE) Calculated using the CKD-EPI Creatinine Equation (2021)    eGFR  Date Value Ref Range Status  10/08/2022 101 >59 mL/min/1.73 Final         Passed - Completed PHQ-2 or PHQ-9 in the last 360 days      Passed - Last BP in normal range    BP Readings from Last 1 Encounters:  12/08/22 92/65         Passed - Valid encounter within last 6 months    Recent Outpatient Visits           3 months ago Type 2 diabetes mellitus with other circulatory complication, without long-term current use of insulin ([place])   [place], Charlane Ferretti, MD   5 months ago Edema of both legs   [place] Karle Plumber B, MD   6 months ago Type 2 diabetes mellitus with other circulatory complication, without long-term current use of insulin ([place])   [place], Charlane Ferretti, MD   7 months ago NSTEMI (non-ST elevated myocardial infarction) [facility])   [place], Baker L, RPH-CPP    7 months ago Diabetic polyneuropathy associated with type 2 diabetes mellitus [facility])   [place] Charlott Rakes, MD       Future Appointments             In 5 days Charlott Rakes, MD [place]   In 2 months Donato Heinz, MD [place] [facility]

## 2023-02-03 ENCOUNTER — Other Ambulatory Visit: Payer: Self-pay

## 2023-02-04 ENCOUNTER — Encounter: Payer: Self-pay | Admitting: Family Medicine

## 2023-02-04 ENCOUNTER — Other Ambulatory Visit: Payer: Self-pay

## 2023-02-04 ENCOUNTER — Ambulatory Visit: Payer: 59 | Admitting: Family Medicine

## 2023-02-04 ENCOUNTER — Ambulatory Visit: Payer: 59 | Attending: Family Medicine | Admitting: Family Medicine

## 2023-02-04 ENCOUNTER — Other Ambulatory Visit: Payer: Self-pay | Admitting: Family Medicine

## 2023-02-04 VITALS — BP 124/79 | HR 72

## 2023-02-04 DIAGNOSIS — Z86718 Personal history of other venous thrombosis and embolism: Secondary | ICD-10-CM | POA: Diagnosis not present

## 2023-02-04 DIAGNOSIS — F411 Generalized anxiety disorder: Secondary | ICD-10-CM | POA: Insufficient documentation

## 2023-02-04 DIAGNOSIS — F419 Anxiety disorder, unspecified: Secondary | ICD-10-CM

## 2023-02-04 DIAGNOSIS — E119 Type 2 diabetes mellitus without complications: Secondary | ICD-10-CM | POA: Diagnosis not present

## 2023-02-04 DIAGNOSIS — Z794 Long term (current) use of insulin: Secondary | ICD-10-CM

## 2023-02-04 DIAGNOSIS — R200 Anesthesia of skin: Secondary | ICD-10-CM | POA: Insufficient documentation

## 2023-02-04 DIAGNOSIS — Z7984 Long term (current) use of oral hypoglycemic drugs: Secondary | ICD-10-CM | POA: Insufficient documentation

## 2023-02-04 DIAGNOSIS — I214 Non-ST elevation (NSTEMI) myocardial infarction: Secondary | ICD-10-CM

## 2023-02-04 DIAGNOSIS — I10 Essential (primary) hypertension: Secondary | ICD-10-CM | POA: Diagnosis not present

## 2023-02-04 DIAGNOSIS — E1165 Type 2 diabetes mellitus with hyperglycemia: Secondary | ICD-10-CM | POA: Insufficient documentation

## 2023-02-04 DIAGNOSIS — Z955 Presence of coronary angioplasty implant and graft: Secondary | ICD-10-CM | POA: Diagnosis not present

## 2023-02-04 DIAGNOSIS — K219 Gastro-esophageal reflux disease without esophagitis: Secondary | ICD-10-CM | POA: Insufficient documentation

## 2023-02-04 DIAGNOSIS — E1159 Type 2 diabetes mellitus with other circulatory complications: Secondary | ICD-10-CM

## 2023-02-04 DIAGNOSIS — I252 Old myocardial infarction: Secondary | ICD-10-CM | POA: Insufficient documentation

## 2023-02-04 DIAGNOSIS — I2510 Atherosclerotic heart disease of native coronary artery without angina pectoris: Secondary | ICD-10-CM | POA: Diagnosis not present

## 2023-02-04 DIAGNOSIS — E1142 Type 2 diabetes mellitus with diabetic polyneuropathy: Secondary | ICD-10-CM | POA: Insufficient documentation

## 2023-02-04 DIAGNOSIS — I69354 Hemiplegia and hemiparesis following cerebral infarction affecting left non-dominant side: Secondary | ICD-10-CM | POA: Diagnosis not present

## 2023-02-04 DIAGNOSIS — F32A Depression, unspecified: Secondary | ICD-10-CM | POA: Insufficient documentation

## 2023-02-04 DIAGNOSIS — Z79899 Other long term (current) drug therapy: Secondary | ICD-10-CM | POA: Diagnosis not present

## 2023-02-04 LAB — GLUCOSE, POCT (MANUAL RESULT ENTRY): POC Glucose: 322 mg/dl — AB (ref 70–99)

## 2023-02-04 LAB — POCT GLYCOSYLATED HEMOGLOBIN (HGB A1C): HbA1c, POC (controlled diabetic range): 12.5 % — AB (ref 0.0–7.0)

## 2023-02-04 MED ORDER — DULOXETINE HCL 60 MG PO CPEP: 60.0000 mg | ORAL_CAPSULE | Freq: Every day | ORAL | 1 refills | Status: DC

## 2023-02-04 MED ORDER — ATORVASTATIN CALCIUM 80 MG PO TABS
80.0000 mg | ORAL_TABLET | Freq: Every day | ORAL | 1 refills | Status: DC
  Filled 2023-02-04 – 2023-03-05 (×2): qty 90, 90d supply, fill #0

## 2023-02-04 MED ORDER — LOSARTAN POTASSIUM 50 MG PO TABS
50.0000 mg | ORAL_TABLET | Freq: Every day | ORAL | 1 refills | Status: DC
  Filled 2023-02-04: qty 30, 30d supply, fill #0
  Filled 2023-04-08: qty 90, 90d supply, fill #0

## 2023-02-04 MED ORDER — CARVEDILOL 25 MG PO TABS: ORAL_TABLET | ORAL | 1 refills | Status: DC

## 2023-02-04 MED ORDER — LANTUS SOLOSTAR 100 UNIT/ML ~~LOC~~ SOPN
25.0000 [IU] | PEN_INJECTOR | Freq: Two times a day (BID) | SUBCUTANEOUS | 3 refills | Status: DC
  Filled 2023-02-04: qty 18, 36d supply, fill #0

## 2023-02-04 MED ORDER — FAMOTIDINE 20 MG PO TABS: 20.0000 mg | ORAL_TABLET | Freq: Two times a day (BID) | ORAL | 1 refills | Status: DC

## 2023-02-04 MED ORDER — DULOXETINE HCL 60 MG PO CPEP
60.0000 mg | ORAL_CAPSULE | Freq: Every day | ORAL | 1 refills | Status: DC
  Filled 2023-02-04 – 2023-03-05 (×2): qty 90, 90d supply, fill #0
  Filled 2023-06-10: qty 90, 90d supply, fill #1

## 2023-02-04 MED ORDER — ACCU-CHEK AVIVA PLUS VI STRP
1.0000 | ORAL_STRIP | Freq: Three times a day (TID) | 12 refills | Status: DC
  Filled 2023-02-04: qty 100, 34d supply, fill #0

## 2023-02-04 MED ORDER — CARVEDILOL 25 MG PO TABS
25.0000 mg | ORAL_TABLET | Freq: Two times a day (BID) | ORAL | 1 refills | Status: DC
  Filled 2023-02-04: qty 60, 30d supply, fill #0
  Filled 2023-04-08: qty 180, 90d supply, fill #0

## 2023-02-04 MED ORDER — GLIPIZIDE 10 MG PO TABS
10.0000 mg | ORAL_TABLET | Freq: Two times a day (BID) | ORAL | 1 refills | Status: DC
  Filled 2023-02-04 – 2023-04-08 (×2): qty 180, 90d supply, fill #0

## 2023-02-04 MED ORDER — LOSARTAN POTASSIUM 50 MG PO TABS: 50.0000 mg | ORAL_TABLET | Freq: Every day | ORAL | 1 refills | Status: DC

## 2023-02-04 MED ORDER — GLIPIZIDE 10 MG PO TABS: 10.0000 mg | ORAL_TABLET | Freq: Two times a day (BID) | ORAL | 3 refills | Status: DC

## 2023-02-04 MED ORDER — BASAGLAR KWIKPEN 100 UNIT/ML ~~LOC~~ SOPN
25.0000 [IU] | PEN_INJECTOR | Freq: Two times a day (BID) | SUBCUTANEOUS | 6 refills | Status: DC
  Filled 2023-02-04: qty 15, 30d supply, fill #0

## 2023-02-04 MED ORDER — ACCU-CHEK AVIVA PLUS W/DEVICE KIT: 1.0000 | PACK | Freq: Three times a day (TID) | 0 refills | Status: DC

## 2023-02-04 MED ORDER — ATORVASTATIN CALCIUM 80 MG PO TABS: 80.0000 mg | ORAL_TABLET | Freq: Every day | ORAL | 1 refills | Status: DC

## 2023-02-04 MED ORDER — ACCU-CHEK AVIVA PLUS VI STRP: ORAL_STRIP | 12 refills | Status: DC

## 2023-02-04 MED ORDER — GABAPENTIN 300 MG PO CAPS
600.0000 mg | ORAL_CAPSULE | Freq: Every day | ORAL | 1 refills | Status: DC
  Filled 2023-02-04: qty 60, 30d supply, fill #0

## 2023-02-04 MED ORDER — BASAGLAR KWIKPEN 100 UNIT/ML ~~LOC~~ SOPN: 25.0000 [IU] | PEN_INJECTOR | Freq: Two times a day (BID) | SUBCUTANEOUS | 6 refills | Status: DC

## 2023-02-04 MED ORDER — ACCU-CHEK AVIVA PLUS W/DEVICE KIT
1.0000 | PACK | Freq: Three times a day (TID) | 0 refills | Status: DC
  Filled 2023-02-04: qty 1, 33d supply, fill #0

## 2023-02-04 MED ORDER — GABAPENTIN 300 MG PO CAPS: 600.0000 mg | ORAL_CAPSULE | Freq: Every day | ORAL | 1 refills | Status: DC

## 2023-02-04 NOTE — Progress Notes (Signed)
Numbness in arms Stopped Ozempic due to hair falling out Referral to Rehab.
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
## 2023-02-05 ENCOUNTER — Other Ambulatory Visit: Payer: Self-pay

## 2023-02-11 ENCOUNTER — Other Ambulatory Visit: Payer: Self-pay

## 2023-02-12 ENCOUNTER — Other Ambulatory Visit: Payer: Self-pay

## 2023-02-27 ENCOUNTER — Other Ambulatory Visit: Payer: Self-pay

## 2023-02-27 ENCOUNTER — Ambulatory Visit (HOSPITAL_COMMUNITY)
Admission: RE | Admit: 2023-02-27 | Discharge: 2023-02-27 | Disposition: A | Discharge status: Home / Self Care | Payer: Medicaid Other | Source: Ambulatory Visit | Attending: Emergency Medicine | Admitting: Emergency Medicine

## 2023-02-27 ENCOUNTER — Encounter (HOSPITAL_COMMUNITY): Payer: Self-pay

## 2023-02-27 VITALS — BP 146/85 | HR 90 | Temp 98.6°F | Resp 18

## 2023-02-27 DIAGNOSIS — M546 Pain in thoracic spine: Secondary | ICD-10-CM

## 2023-02-27 DIAGNOSIS — G8929 Other chronic pain: Secondary | ICD-10-CM

## 2023-02-27 DIAGNOSIS — T148XXA Other injury of unspecified body region, initial encounter: Secondary | ICD-10-CM | POA: Diagnosis not present

## 2023-02-27 DIAGNOSIS — E1165 Type 2 diabetes mellitus with hyperglycemia: Secondary | ICD-10-CM

## 2023-02-27 LAB — POCT FASTING CBG KUC MANUAL ENTRY: POCT Glucose (KUC): 375 mg/dL — AB (ref 70–99)

## 2023-02-27 MED ORDER — METHYLPREDNISOLONE SODIUM SUCC 125 MG IJ SOLR
INTRAMUSCULAR | Status: AC
  Filled 2023-02-27: qty 2

## 2023-02-27 MED ORDER — METHOCARBAMOL 500 MG PO TABS
500.0000 mg | ORAL_TABLET | Freq: Two times a day (BID) | ORAL | 0 refills | Status: DC
  Filled 2023-02-27 – 2023-03-06 (×2): qty 20, 10d supply, fill #0

## 2023-02-27 MED ORDER — METHYLPREDNISOLONE SODIUM SUCC 125 MG IJ SOLR
60.0000 mg | Freq: Once | INTRAMUSCULAR | Status: AC
  Administered 2023-02-27: 60 mg via INTRAMUSCULAR
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
## 2023-02-27 NOTE — ED Triage Notes (Signed)
Pt states she has back pain which radiates her the abdomin on the left side. She states she has had this pain since her stroke a couple years ago but she feels its worse now. 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

## 2023-02-27 NOTE — Discharge Instructions (Addendum)
Please schedule a follow-up appointment with your primary care provider so they can refer you for physical therapy for your back pain.  I believe you have a musculoskeletal strain due to tenderness with palpation and reproducible with movement, you can trial the muscle relaxer twice daily as needed.  Please do not drink or drive on this medication as it may make you drowsy.  You can also do warm compress, gentle stretching and epsom salt baths to help with your pain.   Please also follow-up with your primary care regarding your diabetic control, your blood sugar was elevated today in clinic.

## 2023-03-05 ENCOUNTER — Other Ambulatory Visit: Payer: Self-pay

## 2023-03-06 ENCOUNTER — Other Ambulatory Visit: Payer: Self-pay

## 2023-03-11 ENCOUNTER — Other Ambulatory Visit: Payer: Self-pay | Admitting: Pharmacist

## 2023-03-11 ENCOUNTER — Encounter: Payer: Self-pay | Admitting: Family Medicine

## 2023-03-11 ENCOUNTER — Other Ambulatory Visit (HOSPITAL_COMMUNITY)
Admission: RE | Admit: 2023-03-11 | Discharge: 2023-03-11 | Disposition: A | Discharge status: Home / Self Care | Payer: No Typology Code available for payment source | Source: Ambulatory Visit | Attending: Family Medicine | Admitting: Family Medicine

## 2023-03-11 ENCOUNTER — Ambulatory Visit: Payer: Medicaid Other | Attending: Family Medicine | Admitting: Family Medicine

## 2023-03-11 ENCOUNTER — Other Ambulatory Visit: Payer: Self-pay

## 2023-03-11 VITALS — BP 179/109 | HR 93 | Ht 67.0 in | Wt 210.8 lb

## 2023-03-11 DIAGNOSIS — R1012 Left upper quadrant pain: Secondary | ICD-10-CM | POA: Diagnosis not present

## 2023-03-11 DIAGNOSIS — Z124 Encounter for screening for malignant neoplasm of cervix: Secondary | ICD-10-CM | POA: Diagnosis not present

## 2023-03-11 DIAGNOSIS — Z7984 Long term (current) use of oral hypoglycemic drugs: Secondary | ICD-10-CM

## 2023-03-11 DIAGNOSIS — Z1211 Encounter for screening for malignant neoplasm of colon: Secondary | ICD-10-CM

## 2023-03-11 DIAGNOSIS — Z0001 Encounter for general adult medical examination with abnormal findings: Secondary | ICD-10-CM

## 2023-03-11 DIAGNOSIS — Z1231 Encounter for screening mammogram for malignant neoplasm of breast: Secondary | ICD-10-CM

## 2023-03-11 DIAGNOSIS — B3731 Acute candidiasis of vulva and vagina: Secondary | ICD-10-CM | POA: Diagnosis not present

## 2023-03-11 DIAGNOSIS — E1159 Type 2 diabetes mellitus with other circulatory complications: Secondary | ICD-10-CM

## 2023-03-11 MED ORDER — TRUEPLUS LANCETS 28G MISC
6 refills | Status: AC
Start: 2023-03-11 — End: ?
  Filled 2023-03-11: qty 100, 25d supply, fill #0

## 2023-03-11 MED ORDER — FLUCONAZOLE 150 MG PO TABS
150.0000 mg | ORAL_TABLET | Freq: Once | ORAL | 0 refills | Status: AC
Start: 2023-03-11 — End: 2023-03-11
  Filled 2023-03-11: qty 1, 1d supply, fill #0

## 2023-03-11 MED ORDER — TRUE METRIX METER W/DEVICE KIT
PACK | 0 refills | Status: AC
Start: 2023-03-11 — End: ?
  Filled 2023-03-11: qty 1, 28d supply, fill #0

## 2023-03-11 MED ORDER — ACCU-CHEK GUIDE VI STRP
ORAL_STRIP | 6 refills | Status: AC
  Filled 2023-03-11: qty 50, 17d supply, fill #0

## 2023-03-11 NOTE — Progress Notes (Signed)
CPE Having pain in left side of abdomen
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
## 2023-03-11 NOTE — Patient Instructions (Signed)

## 2023-03-14 LAB — CYTOLOGY - PAP
Adequacy: ABSENT
Comment: NEGATIVE
Diagnosis: NEGATIVE
Diagnosis: REACTIVE
High risk HPV: NEGATIVE

## 2023-03-16 ENCOUNTER — Other Ambulatory Visit: Payer: Self-pay | Admitting: Family Medicine

## 2023-03-16 MED ORDER — FLUCONAZOLE 150 MG PO TABS: 150.0000 mg | ORAL_TABLET | Freq: Once | ORAL | 0 refills | Status: AC

## 2023-03-18 ENCOUNTER — Other Ambulatory Visit: Payer: Self-pay

## 2023-03-25 ENCOUNTER — Other Ambulatory Visit: Payer: Self-pay

## 2023-04-08 ENCOUNTER — Other Ambulatory Visit: Payer: Self-pay

## 2023-04-08 ENCOUNTER — Other Ambulatory Visit: Payer: Self-pay | Admitting: Family Medicine

## 2023-04-08 DIAGNOSIS — R600 Localized edema: Secondary | ICD-10-CM

## 2023-04-08 MED ORDER — FUROSEMIDE 20 MG PO TABS
20.0000 mg | ORAL_TABLET | Freq: Every day | ORAL | 0 refills | Status: DC | PRN
Start: 2023-04-08 — End: 2023-06-12
  Filled 2023-04-08: qty 30, 30d supply, fill #0

## 2023-04-09 ENCOUNTER — Other Ambulatory Visit: Payer: Self-pay
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
## 2023-04-20 ENCOUNTER — Ambulatory Visit: Payer: Medicaid Other | Admitting: Cardiology
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
## 2023-05-01 ENCOUNTER — Ambulatory Visit: Payer: No Typology Code available for payment source | Admitting: General Practice

## 2023-05-11 ENCOUNTER — Other Ambulatory Visit: Payer: Self-pay
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
## 2023-06-11 ENCOUNTER — Telehealth: Payer: Self-pay | Admitting: Cardiology

## 2023-06-11 NOTE — Telephone Encounter (Signed)
Pt is aware to be ready by 10:45 am.  Pt is aware that she will need to sign rider waiver at check in.  Address for pick up confirmed for [address] [place] [address].    06/11/2023  [name] DOB: 04-Jun-1966 MRN: [id number]   RIDER WAIVER AND RELEASE OF LIABILITY  For the purposes of helping with transportation needs, [place] partners with outside transportation providers (taxi companies, [employer], Catering manager.) to give [employer] patients or other approved people the choice of on-demand rides [employer]") to our buildings for non-emergency visits.  By using [employer], I, the person signing this document, on behalf of myself and/or any legal minors (in my care using the [employer]), agree:  Science writer given to me are supplied by independent, outside transportation providers who do not work for, or have any affiliation with, [employer]. [facility] is not a transportation company. [facility] has no control over the quality or safety of the rides I get using [employer]. [facility] has no control over whether any outside ride will happen on time or not. [facility] gives no guarantee on the reliability, quality, safety, or availability on any rides, or that no mistakes will happen. I know and accept that traveling by vehicle (car, truck, SVU, Zenaida Niece, bus, taxi, etc.) has risks of serious injuries such as disability, being paralyzed, and death. I know and agree the risk of using [employer] is mine alone, and not [employer]. Transport Services are provided "as is" and as are available. The transportation providers are in charge for all inspections and care of the vehicles used to provide these rides. I agree not to take legal action against [facility], its agents, employees, officers, directors, representatives, insurers, attorneys, assigns, successors, subsidiaries, and affiliates at any time for any reasons related directly or  indirectly to using [employer]. I also agree not to take legal action against [facility] or its affiliates for any injury, death, or damage to property caused by or related to using [employer]. I have read this Waiver and Release of Liability, and I understand the terms used in it and their legal meaning. This Waiver is freely and voluntarily given with the understanding that my right (or any legal minors) to legal action against  relating to [employer] is knowingly given up to use these services.   I attest that I read the Ride Waiver and Release of Liability to [name], gave [name] the opportunity to ask questions and answered the questions asked (if any). I affirm that [name] then provided consent for assistance with transportation.     [name]

## 2023-06-12 ENCOUNTER — Ambulatory Visit: Payer: No Typology Code available for payment source | Attending: Cardiology | Admitting: General Practice

## 2023-06-12 ENCOUNTER — Encounter: Payer: Self-pay | Admitting: General Practice

## 2023-06-12 ENCOUNTER — Other Ambulatory Visit: Payer: Self-pay

## 2023-06-12 VITALS — BP 104/68 | HR 80 | Ht 65.0 in | Wt 218.0 lb

## 2023-06-12 DIAGNOSIS — E785 Hyperlipidemia, unspecified: Secondary | ICD-10-CM | POA: Diagnosis not present

## 2023-06-12 DIAGNOSIS — R0609 Other forms of dyspnea: Secondary | ICD-10-CM

## 2023-06-12 DIAGNOSIS — I2510 Atherosclerotic heart disease of native coronary artery without angina pectoris: Secondary | ICD-10-CM

## 2023-06-12 DIAGNOSIS — Z794 Long term (current) use of insulin: Secondary | ICD-10-CM

## 2023-06-12 DIAGNOSIS — R600 Localized edema: Secondary | ICD-10-CM

## 2023-06-12 DIAGNOSIS — I10 Essential (primary) hypertension: Secondary | ICD-10-CM | POA: Diagnosis not present

## 2023-06-12 DIAGNOSIS — E119 Type 2 diabetes mellitus without complications: Secondary | ICD-10-CM

## 2023-06-12 DIAGNOSIS — K219 Gastro-esophageal reflux disease without esophagitis: Secondary | ICD-10-CM

## 2023-06-12 DIAGNOSIS — Z8673 Personal history of transient ischemic attack (TIA), and cerebral infarction without residual deficits: Secondary | ICD-10-CM

## 2023-06-12 MED ORDER — FAMOTIDINE 20 MG PO TABS
20.0000 mg | ORAL_TABLET | Freq: Two times a day (BID) | ORAL | 1 refills | Status: AC
Start: 2023-06-12 — End: ?
  Filled 2023-06-12: qty 180, 90d supply, fill #0

## 2023-06-12 MED ORDER — POTASSIUM CHLORIDE CRYS ER 20 MEQ PO TBCR
20.0000 meq | EXTENDED_RELEASE_TABLET | Freq: Every day | ORAL | 0 refills | Status: DC
  Filled 2023-06-12: qty 3, 3d supply, fill #0

## 2023-06-12 MED ORDER — FUROSEMIDE 20 MG PO TABS
40.0000 mg | ORAL_TABLET | Freq: Every day | ORAL | 3 refills | Status: DC
Start: 2023-06-12 — End: 2023-11-01
  Filled 2023-06-12: qty 30, 15d supply, fill #0
  Filled 2023-07-14 – 2023-07-23 (×2): qty 30, 15d supply, fill #1
  Filled 2023-08-25: qty 30, 15d supply, fill #2
  Filled 2023-09-25: qty 30, 15d supply, fill #3

## 2023-06-12 NOTE — Patient Instructions (Signed)
Medication Instructions:  TAKE YOUR LASIX 40MG  FOR 3 DAYS THEN BACK TO 20MG  DAILY TAKE POTASSIUM FOR 3 DAYS THEN STOP *If you need a refill on your cardiac medications before your next appointment, please call your pharmacy*   Lab Work: BMET IN 1 WEEK If you have labs (blood work) drawn today and your tests are completely normal, you will receive your results only by:  MyChart Message (if you have MyChart) OR  A paper copy in the mail If you have any lab test that is abnormal or we need to change your treatment, we will call you to review the results.  Testing/Procedures: Your physician has requested that you have an echocardiogram. Echocardiography is a painless test that uses sound waves to create images of your heart. It provides your doctor with information about the size and shape of your heart and how well your heart's chambers and valves are working. This procedure takes approximately one hour. There are no restrictions for this procedure. Please do NOT wear cologne, perfume, aftershave, or lotions (deodorant is allowed). Please arrive 15 minutes prior to your appointment time.   Follow-Up: At [facility], you and your health needs are our priority.  As part of our continuing mission to provide you with exceptional heart care, we have created designated Provider Care Teams.  These Care Teams include your primary Cardiologist (physician) and Advanced Practice Providers (APPs -  Physician Assistants and Nurse Practitioners) who all work together to provide you with the care you need, when you need it.  Your next appointment:   2-3 week(s)  Provider:   Edd Fabian, FNP-C     Other Instructions -FLUID RESTRICTION 64 OUNCES OR LESS DAILY-MAY HAVE ICE CHIPS -TAKE AND LOG YOUR WEIGHT DAILY-1st THING IN THE MORNING AFTER URINATION. CALL IF YOU GAIN 3 POUNDS IN A DAY/5 PONDS IN A DAY -MAY USE SUGAR FREE GUM AND CANDY -PLEASE READ AND FOLLOW ATTACHED  SALTY 6  -PLEASE  PURCHASE AND WEAR COMPRESSION STOCKINGS-SEE ATTACHED   [employer]  Outlet Store  Training and development officer for [employer] Mailing Address:  [address];   [address]  [place], [place] [address]  Tel [phone] Fx [phone]     High Quality Legwear for Today's Active Lifestyles Maximum Compression at the ankle. Compression lessens gradually up the leg.   We manufacture a wide range of compression hosiery for men and women in  different styles, constructions and levels of support.  How Compression Hosiery Works Regulatory affairs officer, [employer]. compression hosiery works by applying graduated pressure to the  muscles and veins in the legs.  When the calf muscle contracts such as during walking  the compression hosiery will "give" and then return to its original position. By doing so  the hosiery is assists your body's circulatory wellness.  The result is increased leg health and vitality.   Maximum Compression at the ankle Compression lessens gradually up the leg  We Offer: Sheer & Opaque Stockings       COLORS:  Nude, black, white and misc. prints Below Knee Thigh High Pantyhose  High Quality Legwear for Today's Active Lifestyles We manufacture a wide range of compression hosiery for men and women in different styles, construction sand levels of support.  Socks:                     Sheer & Opaque   Compression Levels Include:  Stockings Men's               Below Knee                8-15 mmHg   Women's         Thigh High                 15-20 mmHg  Unisex             Pantyhose                 20-30 mmHg                                                                      30-40 mmHg  4 Simple Ways to Order   Email  [email or web address] Mail/Email orders are subject to processing and handling charges. Allow 7-10 days for receipt.  Phone [phone]  Please allow 24 hours for return call.   In Person  We recommend calling prior  to your visit to confirm store hours as they may change due to holiday, weather, and maintenance.   By Mail When placing an order, please have the following information available. Our representatives are available to assist.     Measurements    THIGH      in.   CALF        in.   ANKLE     in.    Compression  8-15 mmHg >>15-20 mmHg   20-30 mmHg 30-40 mmHg   WOMEN'S MEN'S  Shoe Size Sock Size Shoe Size Sock Size  4 - 5 Small 7.5 and Under Small  5.5 - 7.5 Medium 8 - 10 Medium  8 - 10 Large 10.5 - 12 Large  10.5 and Over X-Large 12.5 and Over X-Large   Knee High Size Chart  Length from CALF MEASUREMENT  floor to bend   in knee. 11" 12" 13" 14" 15" 16" 17" 18" 19" 20" 21" 22"  14" S S S S M M L L L XL XL XL  15" S S S M M L L L XL XL XXL XXL  16" S S M M M L L L XL XXL XXL XXL  17" S M M M M L L XL XL Randa Lynn XXL  18" M M M M L L L XL XL XXL XXL XXL  19" M M M M L L XL XL XL XXL XXL XXL   Thigh High Circumference Sizing Chart                 S M L XL XXL  ANKLE 6.5" - 8" 8" - 9.5" 9.5" - 11" 11" - 12.5" 12.5" - 14"  CALF 10.5" - 14.5" 11.5" - 15.5" 12.5" - 17" 13.5" - 17.5" 14.5" - 19.5"  THIGH 15.5" - 22" 17.5" - 24" 19.5" - 26" 22" - 28" 26" - 32"  HIP UP TO 40" UP TO 44" UP TO 48' UP TO 52" UP TO 56"   Pantyhose Size Chart  Height Petite Medium Tall X-Tall Queen Queen +   Weight Weight Weight Weight Weight Weight  4'11" 95-130 135      5'0 95-125 130-145  170-185   5'1" 90-120 125-155 160-165  170-195   5'2" 90-115 120-145 150-165  170-195   5'3" 90-110 115-140 145-165  170-200 200-225  5'4" 100-105 110-135 140-160 165 170-200 200-225  5'5" 100 105-130 135-160 165 170-200 200-225  5'6"  110-125 130-155 160-165 170-200 200-225  5'7"  110-120 125-150 155-165 170-200 195-225  5'8"   120-145 150-165 170-200 190-225  5'9"   125-140 145-170 175-190 185-220  5'10"   125-135 140-185  185-215  5'11"   130-135 140-185  190-210         

## 2023-06-15 ENCOUNTER — Other Ambulatory Visit: Payer: Self-pay

## 2023-06-15 ENCOUNTER — Telehealth: Payer: Self-pay | Admitting: Cardiology

## 2023-06-15 NOTE — Telephone Encounter (Signed)
[employer] transportation voucher was given to patient 06/12/2023 for pick up from our facility.  Paperwork was done and scanned into chart. Gave patient a copy. Put into [facility] box.

## 2023-06-16 ENCOUNTER — Other Ambulatory Visit: Payer: Self-pay

## 2023-06-16 ENCOUNTER — Ambulatory Visit: Payer: Medicaid Other | Attending: Family Medicine | Admitting: Family Medicine

## 2023-06-16 ENCOUNTER — Encounter: Payer: Self-pay | Admitting: Family Medicine

## 2023-06-16 DIAGNOSIS — Z8673 Personal history of transient ischemic attack (TIA), and cerebral infarction without residual deficits: Secondary | ICD-10-CM | POA: Diagnosis not present

## 2023-06-16 DIAGNOSIS — F419 Anxiety disorder, unspecified: Secondary | ICD-10-CM | POA: Diagnosis not present

## 2023-06-16 DIAGNOSIS — I10 Essential (primary) hypertension: Secondary | ICD-10-CM | POA: Diagnosis not present

## 2023-06-16 DIAGNOSIS — E1159 Type 2 diabetes mellitus with other circulatory complications: Secondary | ICD-10-CM | POA: Diagnosis not present

## 2023-06-16 DIAGNOSIS — F32A Depression, unspecified: Secondary | ICD-10-CM

## 2023-06-16 DIAGNOSIS — I6340 Cerebral infarction due to embolism of unspecified cerebral artery: Secondary | ICD-10-CM

## 2023-06-16 DIAGNOSIS — I214 Non-ST elevation (NSTEMI) myocardial infarction: Secondary | ICD-10-CM

## 2023-06-16 DIAGNOSIS — E1142 Type 2 diabetes mellitus with diabetic polyneuropathy: Secondary | ICD-10-CM

## 2023-06-16 DIAGNOSIS — Z7984 Long term (current) use of oral hypoglycemic drugs: Secondary | ICD-10-CM

## 2023-06-16 LAB — POCT GLYCOSYLATED HEMOGLOBIN (HGB A1C): HbA1c, POC (controlled diabetic range): 12.7 % — AB (ref 0.0–7.0)

## 2023-06-16 LAB — GLUCOSE, POCT (MANUAL RESULT ENTRY): POC Glucose: 326 mg/dl — AB (ref 70–99)

## 2023-06-16 MED ORDER — CLOPIDOGREL BISULFATE 75 MG PO TABS
75.0000 mg | ORAL_TABLET | Freq: Every day | ORAL | 1 refills | Status: DC
Start: 2023-06-16 — End: 2023-12-23
  Filled 2023-06-16: qty 30, 30d supply, fill #0
  Filled 2023-07-14 – 2023-07-23 (×2): qty 30, 30d supply, fill #1
  Filled 2023-08-25: qty 30, 30d supply, fill #2
  Filled 2023-09-16 – 2023-09-25 (×2): qty 30, 30d supply, fill #3
  Filled 2023-11-01: qty 30, 30d supply, fill #4
  Filled 2023-12-04: qty 30, 30d supply, fill #5

## 2023-06-16 MED ORDER — CARVEDILOL 25 MG PO TABS
25.0000 mg | ORAL_TABLET | Freq: Two times a day (BID) | ORAL | 1 refills | Status: DC
  Filled 2023-06-16 – 2023-07-23 (×3): qty 180, 90d supply, fill #0

## 2023-06-16 MED ORDER — GABAPENTIN 300 MG PO CAPS
600.0000 mg | ORAL_CAPSULE | Freq: Every day | ORAL | 1 refills | Status: DC
Start: 2023-06-16 — End: 2023-12-23
  Filled 2023-06-16: qty 180, 90d supply, fill #0

## 2023-06-16 MED ORDER — GLIPIZIDE 10 MG PO TABS
10.0000 mg | ORAL_TABLET | Freq: Two times a day (BID) | ORAL | 1 refills | Status: DC
Start: 2023-06-16 — End: 2023-12-23
  Filled 2023-06-16 – 2023-07-23 (×3): qty 180, 90d supply, fill #0

## 2023-06-16 MED ORDER — LOSARTAN POTASSIUM 50 MG PO TABS
50.0000 mg | ORAL_TABLET | Freq: Every day | ORAL | 1 refills | Status: DC
Start: 2023-06-16 — End: 2023-12-23
  Filled 2023-06-16 – 2023-07-23 (×4): qty 90, 90d supply, fill #0
  Filled 2023-11-01: qty 90, 90d supply, fill #1

## 2023-06-16 MED ORDER — DULOXETINE HCL 60 MG PO CPEP
60.0000 mg | ORAL_CAPSULE | Freq: Every day | ORAL | 1 refills | Status: DC
Start: 2023-06-16 — End: 2023-12-23
  Filled 2023-09-16: qty 90, 90d supply, fill #0
  Filled 2023-12-04: qty 90, 90d supply, fill #1

## 2023-06-16 MED ORDER — ISOSORBIDE MONONITRATE ER 60 MG PO TB24
60.0000 mg | ORAL_TABLET | Freq: Every day | ORAL | 1 refills | Status: DC
  Filled 2023-07-14 – 2023-07-23 (×2): qty 90, 90d supply, fill #0
  Filled 2023-11-01: qty 90, 90d supply, fill #1

## 2023-06-16 MED ORDER — LANTUS SOLOSTAR 100 UNIT/ML ~~LOC~~ SOPN
30.0000 [IU] | PEN_INJECTOR | Freq: Two times a day (BID) | SUBCUTANEOUS | 3 refills | Status: DC
  Filled 2023-06-16: qty 30, 50d supply, fill #0

## 2023-06-16 MED ORDER — ATORVASTATIN CALCIUM 80 MG PO TABS
80.0000 mg | ORAL_TABLET | Freq: Every day | ORAL | 1 refills | Status: DC
Start: 2023-06-16 — End: 2023-12-23
  Filled 2023-06-16: qty 90, 90d supply, fill #0

## 2023-06-16 NOTE — Patient Instructions (Signed)
Please call to set up an appointment for Colonoscopy: Placed in [facility] [address] [place], [place] [address] PH# [phone]
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
## 2023-06-18 ENCOUNTER — Other Ambulatory Visit: Payer: Self-pay

## 2023-06-29 ENCOUNTER — Ambulatory Visit: Payer: No Typology Code available for payment source | Admitting: General Practice
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
## 2023-07-01 ENCOUNTER — Ambulatory Visit (HOSPITAL_COMMUNITY): Payer: No Typology Code available for payment source | Attending: General Practice

## 2023-07-01 DIAGNOSIS — E669 Obesity, unspecified: Secondary | ICD-10-CM | POA: Diagnosis not present

## 2023-07-01 DIAGNOSIS — Z8673 Personal history of transient ischemic attack (TIA), and cerebral infarction without residual deficits: Secondary | ICD-10-CM | POA: Diagnosis not present

## 2023-07-01 DIAGNOSIS — E119 Type 2 diabetes mellitus without complications: Secondary | ICD-10-CM | POA: Insufficient documentation

## 2023-07-01 DIAGNOSIS — I10 Essential (primary) hypertension: Secondary | ICD-10-CM | POA: Insufficient documentation

## 2023-07-01 DIAGNOSIS — I252 Old myocardial infarction: Secondary | ICD-10-CM | POA: Diagnosis not present

## 2023-07-01 DIAGNOSIS — E785 Hyperlipidemia, unspecified: Secondary | ICD-10-CM | POA: Diagnosis not present

## 2023-07-01 DIAGNOSIS — R0609 Other forms of dyspnea: Secondary | ICD-10-CM | POA: Insufficient documentation

## 2023-07-01 DIAGNOSIS — R600 Localized edema: Secondary | ICD-10-CM | POA: Insufficient documentation

## 2023-07-01 LAB — ECHOCARDIOGRAM COMPLETE
Area-P 1/2: 3.34 cm2
P 1/2 time: 557 msec
S' Lateral: 2.6 cm

## 2023-07-02 ENCOUNTER — Other Ambulatory Visit (HOSPITAL_COMMUNITY): Payer: Self-pay

## 2023-07-02 DIAGNOSIS — R0609 Other forms of dyspnea: Secondary | ICD-10-CM

## 2023-07-03 ENCOUNTER — Encounter: Payer: Self-pay | Admitting: General Practice

## 2023-07-03 ENCOUNTER — Ambulatory Visit: Payer: Medicaid Other | Attending: General Practice | Admitting: General Practice

## 2023-07-03 VITALS — BP 104/62 | HR 75 | Ht 65.0 in | Wt 209.2 lb

## 2023-07-03 DIAGNOSIS — I2510 Atherosclerotic heart disease of native coronary artery without angina pectoris: Secondary | ICD-10-CM

## 2023-07-03 DIAGNOSIS — E119 Type 2 diabetes mellitus without complications: Secondary | ICD-10-CM

## 2023-07-03 DIAGNOSIS — E785 Hyperlipidemia, unspecified: Secondary | ICD-10-CM

## 2023-07-03 DIAGNOSIS — Z794 Long term (current) use of insulin: Secondary | ICD-10-CM

## 2023-07-03 DIAGNOSIS — R0609 Other forms of dyspnea: Secondary | ICD-10-CM

## 2023-07-03 DIAGNOSIS — R600 Localized edema: Secondary | ICD-10-CM

## 2023-07-03 DIAGNOSIS — I10 Essential (primary) hypertension: Secondary | ICD-10-CM | POA: Diagnosis not present

## 2023-07-03 NOTE — Patient Instructions (Signed)
Medication Instructions:  The current medical regimen is effective;  continue present plan and medications as directed. Please refer to the Current Medication list given to you today.  *If you need a refill on your cardiac medications before your next appointment, please call your pharmacy*  Lab Work: BMET AND CBC TODAY If you have labs (blood work) drawn today and your tests are completely normal, you will receive your results only by:  MyChart Message (if you have MyChart) OR  A paper copy in the mail If you have any lab test that is abnormal or we need to change your treatment, we will call you to review the results.  Other Instructions CONTINUE YOUR FLUID RESTRICTION  Follow-Up: At [facility], you and your health needs are our priority.  As part of our continuing mission to provide you with exceptional heart care, we have created designated Provider Care Teams.  These Care Teams include your primary Cardiologist (physician) and Advanced Practice Providers (APPs -  Physician Assistants and Nurse Practitioners) who all work together to provide you with the care you need, when you need it.  Your next appointment:   4-6 month(s)  Provider:   Caratachea Ishikawa, MD  or Edd Fabian, FNP       FOLLOW UP WITH DR NEWLIN(PCP) ASAP!

## 2023-07-04 LAB — BASIC METABOLIC PANEL
BUN/Creatinine Ratio: 15 (ref 9–23)
BUN: 11 mg/dL (ref 6–24)
CO2: 26 mmol/L (ref 20–29)
Calcium: 8.6 mg/dL — ABNORMAL LOW (ref 8.7–10.2)
Chloride: 100 mmol/L (ref 96–106)
Creatinine, Ser: 0.71 mg/dL (ref 0.57–1.00)
Glucose: 359 mg/dL — ABNORMAL HIGH (ref 70–99)
Potassium: 3.9 mmol/L (ref 3.5–5.2)
Sodium: 137 mmol/L (ref 134–144)
eGFR: 98 mL/min/{1.73_m2} (ref >59)

## 2023-07-04 LAB — CBC
Hematocrit: 44.1 % (ref 34.0–46.6)
Hemoglobin: 14.8 g/dL (ref 11.1–15.9)
MCH: 32.4 pg (ref 26.6–33.0)
MCHC: 33.6 g/dL (ref 31.5–35.7)
MCV: 97 fL (ref 79–97)
Platelets: 255 10*3/uL (ref 150–450)
RBC: 4.57 x10E6/uL (ref 3.77–5.28)
RDW: 12.3 % (ref 11.7–15.4)
WBC: 7.3 10*3/uL (ref 3.4–10.8)

## 2023-07-14 ENCOUNTER — Other Ambulatory Visit: Payer: Self-pay

## 2023-07-14 ENCOUNTER — Ambulatory Visit: Payer: Self-pay | Admitting: *Deleted

## 2023-07-14 NOTE — Telephone Encounter (Signed)
Reason for Disposition . [1] Caller requesting NON-URGENT health information AND [2] PCP's office is the best resource  Answer Assessment - Initial Assessment Questions 1. REASON FOR CALL or QUESTION: "What is your reason for calling today?" or "How can I best help you?" or "What question do you have that I can help answer?"     I saw my cardiologist last week.   He told me I was not having a heart attack.   I have blood in my throat and I had this when I had my prior heart attack.   Cardiologist told me to let my PCP know I'm having blood in my throat because I need to have my thyroid checked or it's my esophagus.   This has been going on for 2-3 weeks.  Pt has a follow up appt with Dr. Alvis Lemmings in Nov. 2024.    I have added this message to her appt note and sent a message to Dr. Alvis Lemmings.  Protocols used: Information Only Call - No Triage-A-AH

## 2023-07-15 ENCOUNTER — Other Ambulatory Visit: Payer: Self-pay

## 2023-07-21 ENCOUNTER — Other Ambulatory Visit: Payer: Self-pay

## 2023-07-23 ENCOUNTER — Other Ambulatory Visit: Payer: Self-pay

## 2023-07-23 ENCOUNTER — Other Ambulatory Visit: Payer: Self-pay | Admitting: General Practice

## 2023-07-23 MED ORDER — POTASSIUM CHLORIDE CRYS ER 20 MEQ PO TBCR
20.0000 meq | EXTENDED_RELEASE_TABLET | Freq: Every day | ORAL | 0 refills | Status: DC
  Filled 2023-07-23: qty 3, 3d supply, fill #0

## 2023-07-24 ENCOUNTER — Other Ambulatory Visit: Payer: Self-pay

## 2023-08-25 ENCOUNTER — Other Ambulatory Visit: Payer: Self-pay

## 2023-08-27 ENCOUNTER — Other Ambulatory Visit: Payer: Self-pay
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
## 2023-09-15 ENCOUNTER — Telehealth: Payer: Self-pay | Admitting: Family Medicine

## 2023-09-15 NOTE — Telephone Encounter (Signed)
Patient called to have someone call her back as she needs a cab to pick her up tomorrow for her appt at 3:50pm. Please f/u with patient

## 2023-09-15 NOTE — Telephone Encounter (Signed)
Spoke with daughter Dahlia Byes on the DPR advised that Transportation has been scheduled for 09/16/2023 pick-up at 3:00 PM  Nicole Cella reports that she would like to spoke with patient's provider because patient health has declined and she has a very difficult time walking. Patient can not walk to the restroom , she has to use a BSC.  Patient has not been able to work. Voices that she has 8 children and they have been going to her house daily to make sure she has something to eat. Voices that she feels like her mother not being honest with her PCP about her health. Advised that she consider her or one of her siblings plan on attending patient's appointment on tomorrow. She said she would try.

## 2023-09-16 ENCOUNTER — Telehealth: Payer: Self-pay

## 2023-09-16 ENCOUNTER — Other Ambulatory Visit: Payer: Self-pay

## 2023-09-16 ENCOUNTER — Encounter: Payer: Self-pay | Admitting: Family Medicine

## 2023-09-16 ENCOUNTER — Ambulatory Visit: Payer: Medicaid Other | Attending: Family Medicine | Admitting: Family Medicine

## 2023-09-16 ENCOUNTER — Ambulatory Visit: Payer: No Typology Code available for payment source | Attending: General Practice | Admitting: General Practice

## 2023-09-16 VITALS — BP 149/86 | HR 88 | Ht 65.0 in | Wt 216.2 lb

## 2023-09-16 DIAGNOSIS — Z1211 Encounter for screening for malignant neoplasm of colon: Secondary | ICD-10-CM

## 2023-09-16 DIAGNOSIS — Z7984 Long term (current) use of oral hypoglycemic drugs: Secondary | ICD-10-CM

## 2023-09-16 DIAGNOSIS — Z23 Encounter for immunization: Secondary | ICD-10-CM

## 2023-09-16 DIAGNOSIS — E1159 Type 2 diabetes mellitus with other circulatory complications: Secondary | ICD-10-CM | POA: Diagnosis not present

## 2023-09-16 DIAGNOSIS — R296 Repeated falls: Secondary | ICD-10-CM

## 2023-09-16 DIAGNOSIS — Z139 Encounter for screening, unspecified: Secondary | ICD-10-CM

## 2023-09-16 DIAGNOSIS — F419 Anxiety disorder, unspecified: Secondary | ICD-10-CM

## 2023-09-16 DIAGNOSIS — F32A Depression, unspecified: Secondary | ICD-10-CM

## 2023-09-16 DIAGNOSIS — R221 Localized swelling, mass and lump, neck: Secondary | ICD-10-CM | POA: Diagnosis not present

## 2023-09-16 DIAGNOSIS — I69354 Hemiplegia and hemiparesis following cerebral infarction affecting left non-dominant side: Secondary | ICD-10-CM

## 2023-09-16 DIAGNOSIS — I152 Hypertension secondary to endocrine disorders: Secondary | ICD-10-CM

## 2023-09-16 DIAGNOSIS — Z8673 Personal history of transient ischemic attack (TIA), and cerebral infarction without residual deficits: Secondary | ICD-10-CM

## 2023-09-16 LAB — POCT GLYCOSYLATED HEMOGLOBIN (HGB A1C): HbA1c, POC (controlled diabetic range): 12.4 % — AB (ref 0.0–7.0)

## 2023-09-16 MED ORDER — MISC. DEVICES MISC: 0 refills | Status: DC
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
## 2023-09-16 NOTE — Telephone Encounter (Signed)
At the request of Dr Alvis Lemmings, I met with the patient when she was in the clinic today. She explained that she had been receiving SSDI from April 2024- August  2024 and then the payments stopped. She stated she contacted [employer] and was told that she made too much money and owes them $10,000.   She is not sure what to do next.  She may need to re-apply.  I explained to her that I can refer her to [employer] [facility] [place] for assistance with navigating the termination of her SSDI.  There is no guarantee that they can assist but we can try.  She was in agreement and the referral was placed.  She also explained that she has no income and is having difficulty paying her bills, purchasing food.  She stated she has a Film/video editor from [place] who brings her food on Saturdays. She currently only receives $24/month food stamps.  She was in agreement to placing a referral to [facility] [employer] [facility] for assistance with obtaining groceries and that referral was placed.   She said that her brother passed away last week and her mother has been with her since then.  Her family had assisted with rent payments since she lost her SSDI in August. She has a housemate but still struggles to pay bills.  Dr Alvis Lemmings has placed a referral to [facility] for possible assistance with addressing SDOH issues.

## 2023-09-16 NOTE — Patient Instructions (Signed)
VISIT SUMMARY:  During today's visit, we discussed your ongoing health concerns, including your diabetes management, frequent falls, financial struggles, leg swelling, and concerns about your thyroid. We also reviewed your general health maintenance needs.  YOUR PLAN:  -UNCONTROLLED DIABETES: Your blood sugar levels are higher than desired, with an A1c of 12.4. This means your diabetes is not well-controlled. We have increased your Lantus dose to 30 units daily. Please check your blood sugar levels regularly and report the readings to [place].  -FREQUENT FALLS: You have been experiencing frequent falls, even when using a walker or wheelchair. We will order a walker with a seat to help you rest when needed and initiate home health aide services to assist with your mobility and prevent falls.  -SOCIAL SERVICES/FINANCIAL STRUGGLES: You are facing financial difficulties and struggling to pay for utilities and food. We will refer you to a case manager to discuss SSDI and potential assistance programs, and to the [facility] for additional social work support.  -SWELLING IN LEGS: You have persistent swelling in your legs despite taking Lasix. We recommend you elevate your legs when sitting and limit your salt intake. Continue taking Lasix as prescribed.  -NECK SWELLING: You are concerned about a neck swelling. We will order a thyroid ultrasound and blood tests to assess your thyroid function.  -GENERAL HEALTH MAINTENANCE: We will order a stool test for colon cancer screening and have administered your flu shot today.  INSTRUCTIONS:  Please follow up with regular blood glucose readings and report them to [place]. Elevate your legs when sitting and limit your salt intake. Expect a call from a case manager and the [facility] for assistance with financial and social support. Attend the thyroid ultrasound and blood tests as scheduled.

## 2023-09-17 LAB — T3: T3, Total: 101 ng/dL (ref 71–180)

## 2023-09-17 LAB — TSH: TSH: 0.977 u[IU]/mL (ref 0.450–4.500)

## 2023-09-17 LAB — T4, FREE: Free T4: 1.33 ng/dL (ref 0.82–1.77)

## 2023-09-18 ENCOUNTER — Other Ambulatory Visit: Payer: Self-pay

## 2023-09-21 ENCOUNTER — Other Ambulatory Visit: Payer: Self-pay | Admitting: Licensed Clinical Social Worker

## 2023-09-21 NOTE — Patient Instructions (Signed)
Visit Information  Ms. Demeter was given information about Medicaid Managed Care team care coordination services and verbally consented to engagement with the [facility] Managed Care team.    Following is a copy of your plan of care:  Care Plan : BSW Plan of Care  Updates made by Gustavus Bryant, LCSW since 09/21/2023 12:00 AM     Problem: Quality of Life (General Plan of Care)      Goal: Quality of Life Maintained   Start Date: 09/21/2023  Note:   Timeframe:  Short Term Goal Priority:  High Start Date:      09/21/23             Expected End Date:    ongoing         Follow Up Date 10/05/23 at 9 am     Barriers:  Anxiety and Depression DX Recent loss of Brother Corporate treasurer  Limited social support Lacks knowledge of community resources  Clinical Social Work Goal(s):  Over the next 30 days, patient will work with SW to address concerns related to gaining needed community resource education and connection  Interventions: Patient interviewed and appropriate assessments performed Provided mental health counseling with regard to recent grief. Extensive education provided to patient on grief resource connection in her nearby area. Patient declines need for therapy or mental health support at this time.  Discussed plans with patient for ongoing care management follow up and provided patient with direct contact information for care management team Emotional/Supportive Counseling Per recent PCP office visit note-At the request of Dr Alvis Lemmings, I met with the patient when she was in the clinic today. She explained that she had been receiving SSDI from April 2024- August  2024 and then the payments stopped. She stated she contacted [employer] and was told that she made too much money and owes them $10,000. She is not sure what to do next.  She may need to re-apply.  I explained to her that I can refer her to [employer] [place] for assistance with navigating the termination of her SSDI.  There is no  guarantee that they can assist but we can try.  She was in agreement and the referral was placed. She also explained that she has no income and is having difficulty paying her bills, purchasing food.  She stated she has a Film/video editor from [place] who brings her food on Saturdays. She currently only receives $24/month food stamps. She was in agreement to placing a referral to [employer] for assistance with obtaining groceries and that referral was placed. She said that her brother passed away last week and her mother has been with her since then.  Her family had assisted with rent payments since she lost her SSDI in August. She has a housemate but still struggles to pay bills.  Dr Alvis Lemmings has placed a referral to [facility] for possible assistance with addressing SDOH issues. Patient was educated today on transportation resources. She reports wanting additional resource education on vision and dental resources within her area. [facility] BSW referral placed.   Patient Self Care Activities:  Self administers medications as prescribed Attends all scheduled provider appointments Performs ADL's independently Performs IADL's independently Ability for insight Independent living  Patient Coping Strengths:   Hopefulness Self Advocate Able to Communicate Effectively  Initial Goal       24- Hour Availability:    [facility]  [address] [place], [place] [employer] [phone] Crisis [phone]  [facility] [phone]  [facility]  [phone]    [facility] Crisis Services  [phone] (after hours)   Therapeutic Alternative/Mobile Crisis   [phone]   [place] National Suicide Hotline  325-151-5859 Len Childs) [place] 660   Call [phone] for mental health emergencies   [facility]  [phone]);  [place] and [employer]  [phone]); [place], [place], [place], [place],  [place], [place], [place]    [place] [facility] for [facility] Residents For 24/7 walk-up access to mental health services for [facility] children (4+), adolescents and adults, please visit the [facility] located at [address] in [place], [place].  *[facility] also provides comprehensive outpatient behavioral health services in a variety of locations around the [place].  Connect With [place] [address] [place], [place] [address] HelpLine: (819)719-8997 or [phone]  Get Directions  Find Help 24/7 By Phone Call our 24-hour HelpLine at 4701891268 or 207-475-4800 for immediate assistance for mental health and substance abuse issues.  Walk-In Help [place]: [facility] (Ages 4 and Up) [place]: Emergency Dept., [facility] Additional Resources National Hopeline Network: 1-800-SUICIDE The National Suicide Prevention Lifeline: 1-800-273-TALK     The following coping skill education was provided for stress relief and mental health management: "When your car dies or a deadline looms, how do you respond? Long-term, low-grade or acute stress takes a serious toll on your body and mind, so don't ignore feelings of constant tension. Stress is a natural part of life. However, too much stress can harm our health, especially if it continues every day. This is chronic stress and can put you at risk for heart problems like heart disease and depression. Understand what's happening inside your body and learn simple coping skills to combat the negative impacts of everyday stressors.  Types of Stress There are two types of stress: Emotional - types of emotional stress are relationship problems, pressure at work, financial worries, experiencing discrimination or having a major life change. Physical - Examples of physical stress include being sick having pain, not sleeping well,  recovery from an injury or having an alcohol and drug use disorder. Fight or Flight Sudden or ongoing stress activates your nervous system and floods your bloodstream with adrenaline and cortisol, two hormones that raise blood pressure, increase heart rate and spike blood sugar. These changes pitch your body into a fight or flight response. That enabled our ancestors to outrun saber-toothed tigers, and it's helpful today for situations like dodging a car accident. But most modern chronic stressors, such as finances or a challenging relationship, keep your body in that heightened state, which hurts your health. Effects of Too Much Stress If constantly under stress, most of [place] will eventually start to function less well.  Multiple studies link chronic stress to a higher risk of heart disease, stroke, depression, weight gain, memory loss and even premature death, so it's important to recognize the warning signals. Talk to your doctor about ways to manage stress if you're experiencing any of these symptoms: Prolonged periods of poor sleep. Regular, severe headaches. Unexplained weight loss or gain. Feelings of isolation, withdrawal or worthlessness. Constant anger and irritability. Loss of interest in activities. Constant worrying or obsessive thinking. Excessive alcohol or drug use. Inability to concentrate.  10 Ways to Cope with Chronic Stress It's key to recognize stressful situations as they occur because it allows you to  focus on managing how you react. We all need to know when to close our eyes and take a deep breath when we feel tension rising. Use these tips to prevent or reduce chronic stress. 1. Rebalance Work and Home All work and no play? If you're spending too much time at the office, intentionally put more dates in your calendar to enjoy time for fun, either alone or with others. 2. Get Regular Exercise Moving your body on a regular basis balances the nervous system and increases  blood circulation, helping to flush out stress hormones. Even a daily 20-minute walk makes a difference. Any kind of exercise can lower stress and improve your mood ? just pick activities that you enjoy and make it a regular habit. 3. Eat Well and Limit Alcohol and Stimulants Alcohol, nicotine and caffeine may temporarily relieve stress but have negative health impacts and can make stress worse in the long run. Well-nourished bodies cope better, so start with a good breakfast, add more organic fruits and vegetables for a well-balanced diet, avoid processed foods and sugar, try herbal tea and drink more water. 4. Connect with Supportive People Talking face to face with another person releases hormones that reduce stress. Lean on those good listeners in your life. 5. Carve Out Hobby Time Do you enjoy gardening, reading, listening to music or some other creative pursuit? Engage in activities that bring you pleasure and joy; research shows that reduces stress by almost half and lowers your heart rate, too. 6. Practice Meditation, Stress Reduction or Yoga Relaxation techniques activate a state of restfulness that counterbalances your body's fight-or-flight hormones. Even if this also means a 10-minute break in a long day: listen to music, read, go for a walk in nature, do a hobby, take a bath or spend time with a friend. Also consider doing a mindfulness exercise or try a daily deep breathing or imagery practice. Deep Breathing Slow, calm and deep breathing can help you relax. Try these steps to focus on your breathing and repeat as needed. Find a comfortable position and close your eyes. Exhale and drop your shoulders. Breathe in through your nose; fill your lungs and then your belly. Think of relaxing your body, quieting your mind and becoming calm and peaceful. Breathe out slowly through your nose, relaxing your belly. Think of releasing tension, pain, worries or distress. Repeat steps three and four  until you feel relaxed. Imagery This involves using your mind to excite the senses -- sound, vision, smell, taste and feeling. This may help ease your stress. Begin by getting comfortable and then do some slow breathing. Imagine a place you love being at. It could be somewhere from your childhood, somewhere you vacationed or just a place in your imagination. Feel how it is to be in the place you're imagining. Pay attention to the sounds, air, colors, and who is there with you. This is a place where you feel cared for and loved. All is well. You are safe. Take in all the smells, sounds, tastes and feelings. As you do, feel your body being nourished and healed. Feel the calm that surrounds you. Breathe in all the good. Breathe out any discomfort or tension. 7. Sleep Enough If you get less than seven to eight hours of sleep, your body won't tolerate stress as well as it could. If stress keeps you up at night, address the cause, and add extra meditation into your day to make up for the lost z's. Try to get seven  to nine hours of sleep each night. Make a regular bedtime schedule. Keep your room dark and cool. Try to avoid computers, TV, cell phones and tablets before bed. 8. Bond with Connections You Enjoy Go out for a coffee with a friend, chat with a neighbor, call a family member, visit with a clergy member, or even hang out with your pet. Clinical studies show that spending even a short time with a companion animal can cut anxiety levels almost in half. 9. Take a Vacation Getting away from it all can reset your stress tolerance by increasing your mental and emotional outlook, which makes you a happier, more productive person upon return. Leave your cellphone and laptop at home! 10. See a Counselor, Coach or Therapist If negative thoughts overwhelm your ability to make positive changes, it's time to seek professional help. Make an appointment today--your health and life are worth it."   Dickie La, BSW, MSW, LCSW Licensed Clinical Social Worker [employer]   [facility] [email or web address] Direct Dial: [phone]

## 2023-09-21 NOTE — Patient Outreach (Addendum)
Medicaid Managed Care Social Work Note  09/21/2023 Name:  LUNDON BRANNUM MRN:  [id number] DOB:  August 01, 1966  Laquetta Delise Borgen is an 57 y.o. year old female who is a primary patient of Hoy Register, MD.  The Medicaid Managed Care Coordination team was consulted for assistance with:  Community Resources  Grief Counseling  Ms. Wilcoxen was given information about Medicaid Managed Care Coordination team services today. Arlana Pouch Stults Patient agreed to services and verbal consent obtained.  Engaged with patient  for by telephone forinitial visit in response to referral for case management and/or care coordination services.   Assessments/Interventions:  Review of past medical history, allergies, medications, health status, including review of consultants reports, laboratory and other test data, was performed as part of comprehensive evaluation and provision of chronic care management services.  SDOH: (Social Determinant of Health) assessments and interventions performed: SDOH Interventions    Flowsheet Row Patient Outreach Telephone from 09/21/2023 in [place] POPULATION HEALTH DEPARTMENT Telephone from 09/16/2023 in [facility] Health Comm Health [place] - A Dept Of [place]. [facility] Office Visit from 06/12/2023 in [facility] Refill from 10/08/2022 in [facility] Health Comm Health [place] - A Dept Of [place]. [facility] Office Visit from 06/26/2022 in [facility] [place] - A Dept Of Eligha Bridegroom. [facility] Telephone from 04/18/2022 in [facility]  SDOH Interventions        Food Insecurity Interventions -- Other (Comment)  [referred to [employer] and [employer].] -- -- -- --  Housing Interventions Other (Comment) Other (Comment)  [referred to Armed forces operational officer Aid [employer] [place] for assistance with navigating her SSDI termination of benefits.  She had been receiving $1029/month and that stopped after her  August payment.  Family has been assisting  with rent. Marland Kitchen] -- -- -- Intervention Not Indicated  Transportation Interventions AMB Referral -- Taxi Voucher Given Taxi Voucher Given -- Other (Comment)  [deferred to [employer] to coordinate transportation voucher]  Depression Interventions/Treatment  -- -- -- -- Currently on Treatment --  Financial Strain Interventions --  [Referral placed to [facility] BSW for [employer] connection. Legal Aide and [employer] referals placed by PCP office already] Other (Comment)  [Referred to [employer] [place] for assistance with navigating her termination of SSDI benefits.] -- -- -- Other (Comment)  [will reassess post IPR discharge as needed]  Stress Interventions Other (Comment), Provide Counseling  [Pt's brother passed away recently] -- -- -- -- --       Advanced Directives Status:  Not addressed in this encounter.  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   Left sided numbness 03/30/2022   Stroke (cerebrum) (HCC) 03/30/2022   NSTEMI (non-ST elevated myocardial infarction) (HCC) 03/29/2022   CAD S/P percutaneous coronary angioplasty 03/29/2022   Hyperlipidemia 03/29/2022   Obesity (BMI 30-39.9) 03/29/2022  Cervical radicular pain 04/01/2016   Uncontrolled type 2 diabetes mellitus with hyperglycemia, without long-term current use of insulin (HCC) 03/14/2016   Excessive or frequent menstruation 08/29/2013   Symptomatic menopausal or female climacteric states 08/29/2013   Essential hypertension, benign 08/29/2013    POLYNEUROPATHY OTHER DISEASES CLASSIFIED ELSW 10/13/2007    Care Plan : BSW Plan of Care  Updates made by Gustavus Bryant, LCSW since 09/21/2023 12:00 AM     Problem: Quality of Life (General Plan of Care)      Goal: Quality of Life Maintained   Start Date: 09/21/2023  Note:   Timeframe:  Short Term Goal Priority:  High Start Date:      09/21/23             Expected End Date:    ongoing         Follow Up Date 10/05/23 at 9 am     Barriers:  Anxiety and Depression DX Recent loss of Brother Corporate treasurer  Limited social support Lacks knowledge of community resources  Clinical Social Work Goal(s):  Over the next 30 days, patient will work with SW to address concerns related to gaining needed community resource education and connection  Interventions: Patient interviewed and appropriate assessments performed Provided mental health counseling with regard to recent grief. Extensive education provided to patient on grief resource connection in her nearby area. Patient declines need for therapy or mental health support at this time.  Discussed plans with patient for ongoing care management follow up and provided patient with direct contact information for care management team Emotional/Supportive Counseling Per recent PCP office visit note-At the request of Dr Alvis Lemmings, I met with the patient when she was in the clinic today. She explained that she had been receiving SSDI from April 2024- August  2024 and then the payments stopped. She stated she contacted [employer] and was told that she made too much money and owes them $10,000. She is not sure what to do next.  She may need to re-apply.  I explained to her that I can refer her to [employer] [place] for assistance with navigating the termination of her SSDI.  There is no guarantee that they can assist but we can try.  She was in agreement and the referral was placed. She also explained that she has no income and is having difficulty paying  her bills, purchasing food.  She stated she has a Film/video editor from [place] who brings her food on Saturdays. She currently only receives $24/month food stamps. She was in agreement to placing a referral to [employer] for assistance with obtaining groceries and that referral was placed. She said that her brother passed away last week and her mother has been with her since then.  Her family had assisted with rent payments since she lost her SSDI in August. She has a housemate but still struggles to pay bills.  Dr Alvis Lemmings has placed a referral to [facility] for possible assistance with addressing SDOH issues. Patient was educated today on transportation resources. She reports wanting additional resource education on vision and dental resources within her area. [facility] BSW referral placed.   Patient Self Care Activities:  Self administers medications as prescribed Attends all scheduled provider appointments Performs ADL's independently Performs IADL's independently Ability for insight Independent living  Patient Coping Strengths:   Hopefulness Self Advocate Able to Communicate Effectively  Initial Goal     Follow up:  Patient agrees to Care Plan  and Follow-up. [facility] LCSW will sign off as patient denied any clinical needs at this time. [facility] BSW will follow up on 10/05/23 for community resource connection.  Plan: The Managed Medicaid care management team will reach out to the patient again over the next 30 days.  Dickie La, BSW, MSW, LCSW Licensed Clinical Social Worker [employer]   [email or web address] [email or web address] Direct Dial: [phone]

## 2023-09-25 ENCOUNTER — Telehealth: Payer: Self-pay

## 2023-09-25 ENCOUNTER — Other Ambulatory Visit: Payer: Self-pay

## 2023-09-25 NOTE — Telephone Encounter (Signed)
Call placed to [employer] and they informed me that walker is ready for delivery they were having trouble reaching the patient due to incorrect number on file. Correct phone number was given to [employer] and they will contact the patient.

## 2023-09-25 NOTE — Telephone Encounter (Signed)
Copied from CRM [id number]. Topic: General - Other >> Sep 25, 2023  1:29 PM Franchot Heidelberg wrote: Reason for CRM: Pt called for an update on request for a walker, please advise  Best contact: [phone]

## 2023-09-29 NOTE — Telephone Encounter (Signed)
Completed PCS referral faxed to [employer]

## 2023-09-30 ENCOUNTER — Other Ambulatory Visit: Payer: Self-pay

## 2023-10-05 ENCOUNTER — Other Ambulatory Visit: Payer: Self-pay

## 2023-10-05 ENCOUNTER — Telehealth: Payer: Self-pay

## 2023-10-05 NOTE — Telephone Encounter (Signed)
Pt was called and she had questions regarding her PCS.   I have spoken with the case manage and she informed me that PCS form has been faxed.

## 2023-10-05 NOTE — Patient Outreach (Addendum)
Medicaid Managed Care Social Work Note  10/05/2023 Name:  [name] MRN:  [id number] DOB:  1966-08-21  [name] is an 57 y.o. year old female who is a primary patient of Sandra Register, MD.  The Medicaid Managed Care Coordination team was consulted for assistance with:  [employer]   [name] was given information about Medicaid Managed Care Coordination team services today. [name] Patient agreed to services and verbal consent obtained.  Engaged with patient  for by telephone forinitial visit in response to referral for case management and/or care coordination services.   Assessments/Interventions:  Review of past medical history, allergies, medications, health status, including review of consultants reports, laboratory and other test data, was performed as part of comprehensive evaluation and provision of chronic care management services.  SDOH: (Social Determinant of Health) assessments and interventions performed: SDOH Interventions    Flowsheet Row Patient Outreach Telephone from 09/21/2023 in [place] POPULATION HEALTH DEPARTMENT Telephone from 09/16/2023 in [facility] Comm Health [place] - A Dept Of [place]. [facility] Office Visit from 06/12/2023 in [facility] Refill from 10/08/2022 in [facility] Comm Health [place] - A Dept Of [place]. [facility] Office Visit from 06/26/2022 in [facility] - A Dept Of Eligha Bridegroom. [facility] Telephone from 04/18/2022 in [facility]  SDOH Interventions        Food Insecurity Interventions -- Other (Comment)  [referred to [employer] and One Step Further.] -- -- -- --  Housing Interventions Other (Comment) Other (Comment)  [referred to Armed forces operational officer Aid of [place] for assistance with navigating her SSDI termination of benefits.  She had been receiving $1029/month and that stopped after her  August payment.  Family has been assisting with rent. Marland Kitchen] -- --  -- Intervention Not Indicated  Transportation Interventions AMB Referral -- Taxi Voucher Given Taxi Voucher Given -- Other (Comment)  [deferred to [employer] to coordinate transportation voucher]  Depression Interventions/Treatment  -- -- -- -- Currently on Treatment --  Financial Strain Interventions --  [Referral placed to [facility] BSW for [employer] connection. Legal Aide and One Step Further referals placed by PCP office already] Other (Comment)  [Referred to Legal Aid of [place] for assistance with navigating her termination of SSDI benefits.] -- -- -- Other (Comment)  [will reassess post IPR discharge as needed]  Stress Interventions Other (Comment), Provide Counseling  [Pt's brother passed away recently] -- -- -- -- --     BSW completed a telephone outreach with patient, she states she does not have any income and needs assistance with rent, utilities, transportation, dental and vision resources. Patient states she does have an eviction notice and has court on 12/9. She has received assistance from [employer] already for her utility bill. BSW encouraged patient to try them again and BSW will email resources to [name]. BSW provided patient with information for scheduling transportation for appoitments  Advanced Directives Status:  Not addressed in this encounter.  Care Plan                 Allergies  Allergen Reactions   Aspirin Anaphylaxis and Swelling    Tongue swells   Tramadol Hives   Hydrocodone Rash   Ibuprofen Itching   Morphine And Codeine Itching   Orange Fruit [Citrus] Rash    Medications Reviewed Today   Medications were not reviewed in this encounter     Patient Active Problem List  Diagnosis Date Noted   Hemiparesis affecting left side as late effect of cerebrovascular accident (HCC) 02/04/2023   Anxiety and depression    Cystic disease of liver 03/30/2022   Left sided numbness 03/30/2022   Stroke (cerebrum) (HCC) 03/30/2022   NSTEMI (non-ST  elevated myocardial infarction) (HCC) 03/29/2022   CAD S/P percutaneous coronary angioplasty 03/29/2022   Hyperlipidemia 03/29/2022   Obesity (BMI 30-39.9) 03/29/2022   Cervical radicular pain 04/01/2016   Uncontrolled type 2 diabetes mellitus with hyperglycemia, without long-term current use of insulin (HCC) 03/14/2016   Excessive or frequent menstruation 08/29/2013   Symptomatic menopausal or female climacteric states 08/29/2013   Essential hypertension, benign 08/29/2013   POLYNEUROPATHY OTHER DISEASES CLASSIFIED ELSW 10/13/2007    Conditions to be addressed/monitored per PCP order:   community resources  There are no care plans that you recently modified to display for this patient.   Follow up:  Patient agrees to Care Plan and Follow-up.  Plan: The Managed Medicaid care management team will reach out to the patient again over the next 10 days.  Date/time of next scheduled Social Work care management/care coordination outreach:  10/19/23  [name], [name], [name] [name]  Managed [facility] Social Worker [phone]

## 2023-10-05 NOTE — Telephone Encounter (Signed)
Copied from CRM [id number]. Topic: Referral - Question >> Oct 05, 2023 12:58 PM Epimenio Foot F wrote: Reason for CRM: Pt is calling in requesting Dr. Alvis Lemmings give her a call. Per pt there are some referrals that are incorrect and she needs to speak with her doctor.

## 2023-10-05 NOTE — Patient Instructions (Signed)
Visit Information  [name] was given information about Medicaid Managed Care team care coordination services as a part of their [employer] Car[employer] Medicaid benefit. [name] verbally consentedto engagement with the [facility] Managed Care team.   If you are experiencing a medical emergency, please call 911 or report to your local emergency department or urgent care.   If you have a non-emergency medical problem during routine business hours, please contact your provider's office and ask to speak with a nurse.   For questions related to your [facility] [facility] health plan, please call: [phone]  OR visit the member homepage at: [email or web address]  If you would like to schedule transportation through your [facility] plan, please call the following number at least 2 days in advance of your appointment: [phone]  If you are experiencing a behavioral health crisis, call the [facility]Health [facility] Crisis Line at [phone]). The line is available 24 hours a day, seven days a week.  If you would like help to quit smoking, call 1-800-QUIT-NOW ((418)775-2141) OR Espaol: 1-855-Djelo-Ya (6-644-034-7425) o para ms informacin haga clic aqu or Text READY to 956-387 to register via text   [name] - following are the goals we discussed in your visit today:   Goals Addressed   None      Social Worker will follow up in 10 days.   [name], [name], MHA [facility]  Managed Medicaid Social Worker [phone]   Following is a copy of your plan of care:  There are no care plans that you recently modified to display for this patient.

## 2023-10-08 ENCOUNTER — Other Ambulatory Visit: Payer: No Typology Code available for payment source

## 2023-10-16 ENCOUNTER — Telehealth: Payer: Self-pay

## 2023-10-16 DIAGNOSIS — R221 Localized swelling, mass and lump, neck: Secondary | ICD-10-CM

## 2023-10-16 NOTE — Telephone Encounter (Signed)
Copied from CRM [id number]. Topic: General - Other >> Oct 15, 2023  2:54 PM Runell Gess P wrote: Reason for CRM: Joyce Gross with [facility] imaging called asking for a better diagnosis for the order [place] of soft tissue neck area for anterior neck pain.  They are also need more specific information about the exam and [place].  CB@  [id number] option 1 and them option 5.  They have not been able to schedule because they need clarification.

## 2023-10-16 NOTE — Telephone Encounter (Signed)
Pt has been called and given contact information to call and schedule her appointment.

## 2023-10-16 NOTE — Telephone Encounter (Signed)
Routing to PCP for review.

## 2023-10-16 NOTE — Addendum Note (Signed)
Addended by: Hoy Register on: 10/16/2023 12:19 PM   Modules accepted: Orders

## 2023-10-16 NOTE — Telephone Encounter (Signed)
The diagnosis was neck swelling and 'not neck pain'. I have ordered thyroid ultrasound instead maybe that will be easier to obtain approval.

## 2023-10-19 ENCOUNTER — Other Ambulatory Visit: Payer: Self-pay

## 2023-10-19 NOTE — Patient Instructions (Signed)
Visit Information  The Patient                                              was given information about Medicaid Managed Care team care coordination services and consented to engagement with the Medicaid Managed Care team.   Social Worker will follow up in 30 days.   Alexis Shields, BSW, MHA [place]  Managed Medicaid Social Worker [phone]  

## 2023-10-19 NOTE — Patient Outreach (Signed)
Medicaid Managed Care Social Work Note  10/19/2023 Name:  [name] MRN:  [id number] DOB:  30-Apr-1966  [name] is an 57 y.o. year old female who is a primary patient of Hoy Register, MD.  The Medicaid Managed Care Coordination team was consulted for assistance with:  [facility]   Ms. Hansen was given information about Medicaid Managed Care Coordination team services today. Arlana Pouch Cullers Patient agreed to services and verbal consent obtained.  Engaged with patient  for by telephone forfollow up visit in response to referral for case management and/or care coordination services.   Patient is participating in a Managed Medicaid Plan:  Yes  Assessments/Interventions:  Review of past medical history, allergies, medications, health status, including review of consultants reports, laboratory and other test data, was performed as part of comprehensive evaluation and provision of chronic care management services.  SDOH: (Social Drivers of Health) assessments and interventions performed: SDOH Interventions    Flowsheet Row Patient Outreach Telephone from 09/21/2023 in [place] POPULATION HEALTH DEPARTMENT Telephone from 09/16/2023 in [facility] Comm Health [place] - A Dept Of [place]. [facility] Office Visit from 06/12/2023 in [facility] Refill from 10/08/2022 in [facility] Comm Health [place] - A Dept Of [place]. [facility] Office Visit from 06/26/2022 in [facility] - A Dept Of Eligha Bridegroom. [facility] Telephone from 04/18/2022 in [facility]  SDOH Interventions        Food Insecurity Interventions -- Other (Comment)  [referred to [employer] and One [employer] Further.] -- -- -- --  Housing Interventions Other (Comment) Other (Comment)  [referred to Armed forces operational officer Aid of [place] for assistance with navigating her SSDI termination of benefits.  She had been receiving $1029/month and that stopped after her   August payment.  Family has been assisting with rent. Marland Kitchen] -- -- -- Intervention Not Indicated  Transportation Interventions AMB Referral -- Taxi Voucher Given Taxi Voucher Given -- Other (Comment)  [deferred to [employer] to coordinate transportation voucher]  Depression Interventions/Treatment  -- -- -- -- Currently on Treatment --  Financial Strain Interventions --  [Referral placed to [facility] BSW for [employer] connection. Legal Aide and One [employer] Further referals placed by PCP office already] Other (Comment)  [Referred to Legal Aid of [place] for assistance with navigating her termination of SSDI benefits.] -- -- -- Other (Comment)  [will reassess post IPR discharge as needed]  Stress Interventions Other (Comment), Provide Counseling  [Pt's brother passed away recently] -- -- -- -- --     BSW completed a telephone outreach with patient, she states she did not receive the resources BSW sent, BSW verified email and will resend. Patient states they gave her 10 days for the eviction and she can pay 800 by 12/18 and appeal it. Patient states she did receive her walker and she has an appointment on 1/6 for the assessment for PCS.  Advanced Directives Status:  Not addressed in this encounter.  Care Plan                 Allergies  Allergen Reactions   Aspirin Anaphylaxis and Swelling    Tongue swells   Tramadol Hives   Hydrocodone Rash   Ibuprofen Itching   Morphine And Codeine Itching   Orange Fruit [Citrus] Rash    Medications Reviewed Today   Medications were not reviewed in this encounter     Patient Active Problem List  Diagnosis Date Noted   Hemiparesis affecting left side as late effect of cerebrovascular accident (HCC) 02/04/2023   Anxiety and depression    Cystic disease of liver 03/30/2022   Left sided numbness 03/30/2022   Stroke (cerebrum) (HCC) 03/30/2022   NSTEMI (non-ST elevated myocardial infarction) (HCC) 03/29/2022   CAD S/P percutaneous coronary angioplasty 03/29/2022    Hyperlipidemia 03/29/2022   Obesity (BMI 30-39.9) 03/29/2022   Cervical radicular pain 04/01/2016   Uncontrolled type 2 diabetes mellitus with hyperglycemia, without long-term current use of insulin (HCC) 03/14/2016   Excessive or frequent menstruation 08/29/2013   Symptomatic menopausal or female climacteric states 08/29/2013   Essential hypertension, benign 08/29/2013   POLYNEUROPATHY OTHER DISEASES CLASSIFIED ELSW 10/13/2007    Conditions to be addressed/monitored per PCP order:   community resources  There are no care plans that you recently modified to display for this patient.   Follow up:  Patient agrees to Care Plan and Follow-up.  Plan: The Managed Medicaid care management team will reach out to the patient again over the next 30 days.  Date/time of next scheduled Social Work care management/care coordination outreach:  11/19/23  Gus Puma, Kenard Gower, [facility]  Managed [facility] Social Worker [phone]

## 2023-10-21 ENCOUNTER — Inpatient Hospital Stay: Admission: RE | Admit: 2023-10-21 | Payer: No Typology Code available for payment source | Source: Ambulatory Visit

## 2023-11-01 ENCOUNTER — Other Ambulatory Visit: Payer: Self-pay | Admitting: General Practice

## 2023-11-01 DIAGNOSIS — R600 Localized edema: Secondary | ICD-10-CM

## 2023-11-02 ENCOUNTER — Other Ambulatory Visit: Payer: Self-pay

## 2023-11-02 MED ORDER — FUROSEMIDE 20 MG PO TABS
20.0000 mg | ORAL_TABLET | Freq: Every day | ORAL | 7 refills | Status: DC
  Filled 2023-11-02: qty 30, 30d supply, fill #0
  Filled 2023-12-04: qty 30, 30d supply, fill #1

## 2023-11-03 ENCOUNTER — Other Ambulatory Visit: Payer: Self-pay

## 2023-11-19 ENCOUNTER — Other Ambulatory Visit: Payer: Self-pay

## 2023-11-19 NOTE — Patient Outreach (Signed)
Medicaid Managed Care Social Work Note  11/19/2023 Name:  [name] MRN:  [id number] DOB:  1966/08/07  [name] is an 58 y.o. year old female who is a primary patient of Hoy Register, MD.  The Medicaid Managed Care Coordination team was consulted for assistance with:  [employer]   Ms. Schlack was given information about Medicaid Managed Care Coordination team services today. [name] Patient agreed to services and verbal consent obtained.  Engaged with patient  for by telephone forfollow up visit in response to referral for case management and/or care coordination services.   Patient is participating in a Managed Medicaid Plan:  Yes  Assessments/Interventions:  Review of past medical history, allergies, medications, health status, including review of consultants reports, laboratory and other test data, was performed as part of comprehensive evaluation and provision of chronic care management services.  SDOH: (Social Drivers of Health) assessments and interventions performed: SDOH Interventions    Flowsheet Row Patient Outreach Telephone from 09/21/2023 in [place] POPULATION HEALTH DEPARTMENT Telephone from 09/16/2023 in [facility] Health Comm Health [place] - A Dept Of [place]. [facility] Office Visit from 06/12/2023 in [facility] Refill from 10/08/2022 in [facility] Health Comm Health [place] - A Dept Of [place]. [facility] Office Visit from 06/26/2022 in [facility] - A Dept Of Eligha Bridegroom. [facility] Telephone from 04/18/2022 in [facility]  SDOH Interventions        Food Insecurity Interventions -- Other (Comment)  [referred to [employer] and [employer] Further.] -- -- -- --  Housing Interventions Other (Comment) Other (Comment)  [referred to Armed forces operational officer Aid of [place] for assistance with navigating her SSDI termination of benefits.  She had been receiving $1029/month and that stopped after her   August payment.  Family has been assisting with rent. Marland Kitchen] -- -- -- Intervention Not Indicated  Transportation Interventions AMB Referral -- Taxi Voucher Given Taxi Voucher Given -- Other (Comment)  [deferred to [employer] to coordinate transportation voucher]  Depression Interventions/Treatment  -- -- -- -- Currently on Treatment --  Financial Strain Interventions --  [Referral placed to [facility] BSW for [employer] connection. Legal Aide and One Step Further referals placed by PCP office already] Other (Comment)  [Referred to Legal Aid of [place] for assistance with navigating her termination of SSDI benefits.] -- -- -- Other (Comment)  [will reassess post IPR discharge as needed]  Stress Interventions Other (Comment), Provide Counseling  [Pt's brother passed away recently] -- -- -- -- --      BSW completed a telephone outreach with patient, she states they did not come to complete her PCS assessment and did not come. Patient states her children are paying the rent. Patient states she would like the information for housing authority. BSW and patient agreed for resources to be mailed.  Advanced Directives Status:  Not addressed in this encounter.  Care Plan                 Allergies  Allergen Reactions   Aspirin Anaphylaxis and Swelling    Tongue swells   Tramadol Hives   Hydrocodone Rash   Ibuprofen Itching   Morphine And Codeine Itching   Orange Fruit [Citrus] Rash    Medications Reviewed Today   Medications were not reviewed in this encounter     Patient Active Problem List   Diagnosis Date Noted   Hemiparesis affecting left side as late  effect of cerebrovascular accident (HCC) 02/04/2023   Anxiety and depression    Cystic disease of liver 03/30/2022   Left sided numbness 03/30/2022   Stroke (cerebrum) (HCC) 03/30/2022   NSTEMI (non-ST elevated myocardial infarction) (HCC) 03/29/2022   CAD S/P percutaneous coronary angioplasty 03/29/2022   Hyperlipidemia 03/29/2022   Obesity (BMI  30-39.9) 03/29/2022   Cervical radicular pain 04/01/2016   Uncontrolled type 2 diabetes mellitus with hyperglycemia, without long-term current use of insulin (HCC) 03/14/2016   Excessive or frequent menstruation 08/29/2013   Symptomatic menopausal or female climacteric states 08/29/2013   Essential hypertension, benign 08/29/2013   POLYNEUROPATHY OTHER DISEASES CLASSIFIED ELSW 10/13/2007    Conditions to be addressed/monitored per PCP order:   communality resources  There are no care plans that you recently modified to display for this patient.   Follow up:  Patient agrees to Care Plan and Follow-up.  Plan: The Managed Medicaid care management team will reach out to the patient again over the next 30 days.  Date/time of next scheduled Social Work care management/care coordination outreach:  12/21/23  Gus Puma, Kenard Gower, [facility]  Managed [facility] Social Worker [phone]

## 2023-11-19 NOTE — Patient Instructions (Signed)
Visit Information  [name] was given information about Medicaid Managed Care team care coordination services as a part of their [employer] Car[employer] Medicaid benefit. [name] verbally consentedto engagement with the [facility] Managed Care team.   If you are experiencing a medical emergency, please call 911 or report to your local emergency department or urgent care.   If you have a non-emergency medical problem during routine business hours, please contact your provider's office and ask to speak with a nurse.   For questions related to your [employer] [facility] health plan, please call: [phone]  OR visit the member homepage at: [email or web address]  If you would like to schedule transportation through your [facility] plan, please call the following number at least 2 days in advance of your appointment: [phone]  If you are experiencing a behavioral health crisis, call the [facility]Health [facility] Crisis Line at [phone]). The line is available 24 hours a day, seven days a week.  If you would like help to quit smoking, call 1-800-QUIT-NOW ((762)666-7816) OR Espaol: 1-855-Djelo-Ya (6-644-034-7425) o para ms informacin haga clic aqu or Text READY to 956-387 to register via text   [name] - following are the goals we discussed in your visit today:   Goals Addressed   None      Social Worker will follow up in 30 days.   [name], [name], MHA [facility] Health  Managed Medicaid Social Worker [phone]   Following is a copy of your plan of care:  There are no care plans that you recently modified to display for this patient.

## 2023-12-11 ENCOUNTER — Other Ambulatory Visit: Payer: Self-pay

## 2023-12-15 ENCOUNTER — Other Ambulatory Visit: Payer: Self-pay

## 2023-12-15 ENCOUNTER — Telehealth: Payer: Self-pay | Admitting: *Deleted

## 2023-12-15 NOTE — Telephone Encounter (Addendum)
Dr. Baxter Flattery team follow up required:  Patient may need assistance with obtaining CRC screening.  Patient has an active order for Cologuard and GI referral for colonoscopy can be reopened until 03/10/24.    Please contact patient at [phone] and/ or discuss at next office visit with Dr. Alvis Lemmings on 12/23/23.    You may also contact Elmer Picker BSN, RN,CCM, Health Equity Program Manager (HEPM) at [phone] if additional information needed.  Chart reviewed and [employer] verification completed.   Chart review highlights and potential barriers to colorectal cancer screening (CRC) screening: Per patient's health maintenance, patient has never had colonoscopy.  Patient 's most recent GI referral was closed on 04/16/23 because patient did not return call to [facility] to schedule. GI referral order placed on 03/11/23 and is able to be reopened if needed. Patient had last Fecal occult blood test completed on 03/29/22 with negative results.  Cologuard order placed on 09/16/23 and no result in [facility]).      Telephone call to patient, no answer, left voicemail message, advised calling from Dr. Baxter Flattery office, and requested call back at my direct extension [phone]).   Appointment note updated for 12/23/23 office visit note with Dr. Alvis Lemmings: Patient may need assistance with obtaining CRC screening.  Patient has an active order for Cologuard and GI referral for colonoscopy can be reopened until 03/10/24.    Please contact patient at [phone] and/ or discuss at next office visit with Dr. Alvis Lemmings on 12/23/23.    You may also contact Elmer Picker BSN, RN,CCM, Health Equity Program Manager (HEPM) at [phone] if additional information needed.  Everlena Mackley H. Docia Furl, Charity fundraiser, [facility] [phone]

## 2023-12-21 ENCOUNTER — Other Ambulatory Visit: Payer: Self-pay

## 2023-12-21 NOTE — Patient Outreach (Signed)
  Medicaid Managed Care   Unsuccessful Outreach Note  12/21/2023 Name: [name] MRN: [id number] DOB: [birth year]  Referred by: Hoy Register, MD Reason for referral : High Risk Managed Medicaid (MM social work unsuccessful telephone outreach)   An unsuccessful telephone outreach was attempted today. The patient was referred to the case management team for assistance with care management and care coordination.   Follow Up Plan: A HIPAA compliant phone message was left for the patient providing contact information and requesting a return call.   Abelino Derrick, MHA [facility]  Managed [facility] Social Worker [phone]

## 2023-12-21 NOTE — Patient Instructions (Signed)
  Medicaid Managed Care   Unsuccessful Outreach Note  12/21/2023 Name: [name] MRN: [id number] DOB: [birth year]  Referred by: Hoy Register, MD Reason for referral : High Risk Managed Medicaid (MM social work unsuccessful telephone outreach)   An unsuccessful telephone outreach was attempted today. The patient was referred to the case management team for assistance with care management and care coordination.   Follow Up Plan: A HIPAA compliant phone message was left for the patient providing contact information and requesting a return call.   Abelino Derrick, MHA [facility]  Managed [facility] Social Worker [phone]

## 2023-12-23 ENCOUNTER — Encounter: Payer: Self-pay | Admitting: Family Medicine

## 2023-12-23 ENCOUNTER — Ambulatory Visit: Payer: No Typology Code available for payment source | Attending: Family Medicine | Admitting: Family Medicine

## 2023-12-23 ENCOUNTER — Telehealth: Payer: Self-pay | Admitting: Family Medicine

## 2023-12-23 DIAGNOSIS — I214 Non-ST elevation (NSTEMI) myocardial infarction: Secondary | ICD-10-CM

## 2023-12-23 DIAGNOSIS — E1159 Type 2 diabetes mellitus with other circulatory complications: Secondary | ICD-10-CM

## 2023-12-23 DIAGNOSIS — Z8673 Personal history of transient ischemic attack (TIA), and cerebral infarction without residual deficits: Secondary | ICD-10-CM

## 2023-12-23 DIAGNOSIS — F32A Depression, unspecified: Secondary | ICD-10-CM

## 2023-12-23 DIAGNOSIS — Z634 Disappearance and death of family member: Secondary | ICD-10-CM

## 2023-12-23 DIAGNOSIS — Z7984 Long term (current) use of oral hypoglycemic drugs: Secondary | ICD-10-CM

## 2023-12-23 DIAGNOSIS — E1142 Type 2 diabetes mellitus with diabetic polyneuropathy: Secondary | ICD-10-CM

## 2023-12-23 DIAGNOSIS — I69354 Hemiplegia and hemiparesis following cerebral infarction affecting left non-dominant side: Secondary | ICD-10-CM | POA: Diagnosis not present

## 2023-12-23 DIAGNOSIS — F419 Anxiety disorder, unspecified: Secondary | ICD-10-CM

## 2023-12-23 DIAGNOSIS — Z794 Long term (current) use of insulin: Secondary | ICD-10-CM

## 2023-12-23 DIAGNOSIS — I10 Essential (primary) hypertension: Secondary | ICD-10-CM | POA: Diagnosis not present

## 2023-12-23 MED ORDER — LANTUS SOLOSTAR 100 UNIT/ML ~~LOC~~ SOPN: 25.0000 [IU] | PEN_INJECTOR | Freq: Two times a day (BID) | SUBCUTANEOUS | 3 refills | Status: DC

## 2023-12-23 MED ORDER — GABAPENTIN 300 MG PO CAPS: 600.0000 mg | ORAL_CAPSULE | Freq: Every day | ORAL | 1 refills | Status: DC

## 2023-12-23 MED ORDER — CARVEDILOL 25 MG PO TABS: 25.0000 mg | ORAL_TABLET | Freq: Two times a day (BID) | ORAL | 1 refills | Status: DC

## 2023-12-23 MED ORDER — DULOXETINE HCL 60 MG PO CPEP: 60.0000 mg | ORAL_CAPSULE | Freq: Every day | ORAL | 1 refills | Status: DC

## 2023-12-23 MED ORDER — GLIPIZIDE 10 MG PO TABS: 10.0000 mg | ORAL_TABLET | Freq: Two times a day (BID) | ORAL | 1 refills | Status: DC

## 2023-12-23 MED ORDER — CLOPIDOGREL BISULFATE 75 MG PO TABS: 75.0000 mg | ORAL_TABLET | Freq: Every day | ORAL | 1 refills | Status: DC

## 2023-12-23 MED ORDER — ATORVASTATIN CALCIUM 80 MG PO TABS: 80.0000 mg | ORAL_TABLET | Freq: Every day | ORAL | 1 refills | Status: DC

## 2023-12-23 MED ORDER — LOSARTAN POTASSIUM 50 MG PO TABS: 50.0000 mg | ORAL_TABLET | Freq: Every day | ORAL | 1 refills | Status: DC

## 2023-12-23 MED ORDER — ISOSORBIDE MONONITRATE ER 60 MG PO TB24: 60.0000 mg | ORAL_TABLET | Freq: Every day | ORAL | 1 refills | Status: AC

## 2023-12-23 NOTE — Telephone Encounter (Signed)
She has been expecting a home health aide and is concerned that she has not heard from the homecare agency.  Thank you.

## 2023-12-23 NOTE — Telephone Encounter (Signed)
A PCS request had been submitted 09/29/2023.  I called [employer]: [phone] and spoke to [facility] who confirmed that they have the referral and an open case; but she was not sure why the patient has not been contacted for an assessment. She stated that she just put in a request for a caseworker to reach out  to the patient, hopefully today, to schedule the assessment.    I called the patient and informed her of my conversation with [employer] and instructed her to call me at the end of this week if she does not hear from [employer].  She said she understood and would call me.

## 2023-12-23 NOTE — Progress Notes (Signed)
Virtual Visit via Telephone Note  I connected with [name], on 12/23/2023 at 3:33 PM by telephone and verified that I am speaking with the correct person using two identifiers.  Clinician has audio - video capabilities but patient was only able to operate/preferred audio.    Consent: I discussed the limitations, risks, security and privacy concerns of performing an evaluation and management service by telephone and the availability of in person appointments. I also discussed with the patient that there may be a patient responsible charge related to this service. The patient expressed understanding and agreed to proceed.   Location of Patient: Home  Location of Provider: Clinic   Persons participating in Telemedicine visit: Karliah Kowalchuk Farrington-CMA Dr. Alvis Lemmings     History of Present Illness: [name] is a 58 y.o. year old female with  a history of type 2 diabetes mellitus , hypertension, CAD (status post DES of PDA).  Left lower extremity DVT (diagnosed in 05/16/2022), CVA with left sided weakness (following In 03/2022) treated with TNK.    Discussed the use of AI scribe software for clinical note transcription with the patient, who gave verbal consent to proceed.  She presents with worsening left leg numbness and weakness. She reports that her left leg, previously numb from the thigh to the foot, is now giving out, causing her to fall frequently despite using a walker. She also mentions experiencing chest pain and vomiting blood. In addition to these symptoms, [name] has been experiencing headaches, which she attributes to stress and grief following the death of her brother. She reports that her blood sugar levels have been fluctuating, sometimes reaching as high as 500.  Is been administering 20 units of Lantus twice daily rather than 30 units twice daily prescribed for her. She also expresses frustration and concern about her current health situation,  as her disability benefits have been discontinued and she is considering returning to work. However, she is unsure how to manage her health issues in a work environment.      Past Medical History:  Diagnosis Date   Anxiety    Coronary artery disease    Diabetes mellitus    Embolism (HCC)    Hypertension    Sarcoidosis    Allergies  Allergen Reactions   Aspirin Anaphylaxis and Swelling    Tongue swells   Tramadol Hives   Hydrocodone Rash   Ibuprofen Itching   Morphine And Codeine Itching   Orange Fruit [Citrus] Rash    Current Outpatient Medications on File Prior to Visit  Medication Sig Dispense Refill   albuterol (VENTOLIN HFA) 108 (90 Base) MCG/ACT inhaler INHALE 2 PUFFS BY MOUTH EVERY 6 HOURS AS NEEDED FOR SHORTNESS OF BREATH OR WHEEZING 18 g 10   Blood Glucose Monitoring Suppl (TRUE METRIX METER) w/Device KIT use as directed three times a day 1 kit 0   diclofenac Sodium (VOLTAREN) 1 % GEL Apply 2 g topically 2 (two) times daily as needed. 100 g 0   famotidine (PEPCID) 20 MG tablet TAKE 1 TABLET BY MOUTH TWICE DAILY 180 tablet 1   furosemide (LASIX) 20 MG tablet Take 1 tablet (20 mg total) by mouth daily. 30 tablet 7   glucose blood (ACCU-CHEK GUIDE) test strip Use as instructed to check blood sugar three times daily. 100 each 6   Insulin Pen Needle (B-D UF III MINI PEN NEEDLES) 31G X 5 MM MISC USE AS DIRECTED 2 TIMES A DAY 100 each 5  methocarbamol (ROBAXIN) 500 MG tablet Take 1 tablet (500 mg total) by mouth 2 (two) times daily. 20 tablet 0   Misc. Devices MISC Rolling walker with seat 1 each 0   nitroGLYCERIN (NITROSTAT) 0.4 MG SL tablet PLACE 1 TABLET UNDER THE TOUNGE EVERY 5 MINS  AS NEEDED FOR CHEST PAIN 100 tablet 0   polyethylene glycol powder (GLYCOLAX/MIRALAX) 17 GM/SCOOP powder Take 17 g by mouth daily. 3350 g 1   potassium chloride SA (KLOR-CON M) 20 MEQ tablet Take 1 tablet (20 mEq total) by mouth daily. Take 1 tab daily  for 3days with your lasix. 3 tablet 0    TRUEplus Lancets 28G MISC use as directed mto test blood sugars 3 times a day 100 each 6   [DISCONTINUED] lisinopril (ZESTRIL) 20 MG tablet Take 1 tablet (20 mg total) by mouth daily. 30 tablet 5   [DISCONTINUED] omeprazole (PRILOSEC) 40 MG capsule Take 1 capsule (40 mg total) by mouth daily. 30 capsule 0   No current facility-administered medications on file prior to visit.    ROS: HPI  Observations/Objective: Clinician has audio - video capabilities but patient was only able to operate/preferred audio.      Latest Ref Rng & Units 07/03/2023   10:05 AM 11/13/2022   12:01 AM 11/12/2022   11:09 PM  CMP  Glucose 70 - 99 mg/dL 161   096   BUN 6 - 24 mg/dL 11   16   Creatinine 0.45 - 1.00 mg/dL 4.09   8.11   Sodium 914 - 144 mmol/L 137  137  136   Potassium 3.5 - 5.2 mmol/L 3.9  3.9  3.9   Chloride 96 - 106 mmol/L 100   105   CO2 20 - 29 mmol/L 26   23   Calcium 8.7 - 10.2 mg/dL 8.6   8.8   Total Protein 6.5 - 8.1 g/dL   5.6   Total Bilirubin 0.3 - 1.2 mg/dL   0.7   Alkaline Phos 38 - 126 U/L   68   AST 15 - 41 U/L   25   ALT 0 - 44 U/L   22     Lipid Panel     Component Value Date/Time   CHOL 149 10/08/2022 1035   TRIG 84 10/08/2022 1035   HDL 47 10/08/2022 1035   CHOLHDL 6.0 03/31/2022 0301   VLDL 39 03/31/2022 0301   LDLCALC 86 10/08/2022 1035   LABVLDL 16 10/08/2022 1035    Lab Results  Component Value Date   HGBA1C 12.4 (A) 09/16/2023    Assessment and Plan:     Residual left-sided weakness from stroke Increased falls despite using a walker. Patient also reports left-sided headaches. -Risk factor modification including intensity statin, multiple glycemic control.  Continue Plavix for secondary prevention -She states she never saw a neurologist after her stroke though I had referred her. -Refer to neurology for further evaluation and management. -Advise patient to use wheelchair to prevent falls.  Uncontrolled Diabetes Patient reports blood sugars ranging  from 130 to 500. Last A1C was 12.4 in November. -Increase Lantus from 20 units to 25 units twice daily. -Check A1C and fasting blood glucose on 12/28/2023.  NSTEM/ Chest Pain Patient reported chest pain and vomiting blood. Advised to go to the emergency room for evaluation. -If chest pain recurs, advise patient to seek immediate medical attention.  Home Care Patient reports that home care services have not been provided as expected. -Contact case manager to investigate  the issue with home care services.  Anxiety and depression -Stable -Currently on Cymbalta   Colon Cancer Screening Patient has not completed Cologuard test. -Encourage patient to complete and return Cologuard test.  Follow-up -Schedule follow-up appointment in 3 months. -Communicate lab results via MyChart.        Follow Up Instructions: 3 months for chronic disease management   I discussed the assessment and treatment plan with the patient. The patient was provided an opportunity to ask questions and all were answered. The patient agreed with the plan and demonstrated an understanding of the instructions.   The patient was advised to call back or seek an in-person evaluation if the symptoms worsen or if the condition fails to improve as anticipated.     I provided 12 minutes total of non-face-to-face time during this encounter.   Hoy Register, MD, FAAFP. [facility] [place], [place] [phone]   12/23/2023, 3:33 PM

## 2023-12-28 ENCOUNTER — Other Ambulatory Visit: Payer: No Typology Code available for payment source

## 2023-12-29 ENCOUNTER — Other Ambulatory Visit: Payer: No Typology Code available for payment source

## 2024-01-06 ENCOUNTER — Telehealth: Payer: Self-pay

## 2024-01-06 NOTE — Progress Notes (Signed)
..   Medicaid Managed Care   Unsuccessful Outreach Note  01/06/2024 Name: [name] MRN: [id number] DOB: 1966/08/22  Referred by: Hoy Register, MD Reason for referral : High Risk Managed Medicaid   A second unsuccessful telephone outreach was attempted today. The patient was referred to the case management team for assistance with care management and care coordination.   Follow Up Plan: A HIPAA compliant phone message was left for the patient providing contact information and requesting a return call.  The care management team will reach out to the patient again over the next 14 days.   Weston Settle [facility], [facility] Guide Direct Dial: [phone]  Fax: [phone]

## 2024-01-09 ENCOUNTER — Emergency Department (HOSPITAL_COMMUNITY)

## 2024-01-09 ENCOUNTER — Other Ambulatory Visit: Payer: Self-pay

## 2024-01-09 ENCOUNTER — Encounter (HOSPITAL_COMMUNITY): Payer: Self-pay

## 2024-01-09 ENCOUNTER — Inpatient Hospital Stay (HOSPITAL_COMMUNITY)
Admission: EM | Admit: 2024-01-09 | Discharge: 2024-01-13 | DRG: 280 | Disposition: A | Discharge status: Home Health | Attending: Cardiology | Admitting: Cardiology

## 2024-01-09 DIAGNOSIS — Z7902 Long term (current) use of antithrombotics/antiplatelets: Secondary | ICD-10-CM

## 2024-01-09 DIAGNOSIS — I160 Hypertensive urgency: Secondary | ICD-10-CM | POA: Diagnosis present

## 2024-01-09 DIAGNOSIS — E1169 Type 2 diabetes mellitus with other specified complication: Secondary | ICD-10-CM | POA: Diagnosis present

## 2024-01-09 DIAGNOSIS — R4701 Aphasia: Secondary | ICD-10-CM | POA: Diagnosis not present

## 2024-01-09 DIAGNOSIS — Z833 Family history of diabetes mellitus: Secondary | ICD-10-CM

## 2024-01-09 DIAGNOSIS — I110 Hypertensive heart disease with heart failure: Secondary | ICD-10-CM

## 2024-01-09 DIAGNOSIS — Z794 Long term (current) use of insulin: Secondary | ICD-10-CM

## 2024-01-09 DIAGNOSIS — F32A Depression, unspecified: Secondary | ICD-10-CM | POA: Diagnosis present

## 2024-01-09 DIAGNOSIS — Z86718 Personal history of other venous thrombosis and embolism: Secondary | ICD-10-CM

## 2024-01-09 DIAGNOSIS — I97821 Postprocedural cerebrovascular infarction during other surgery: Secondary | ICD-10-CM | POA: Diagnosis not present

## 2024-01-09 DIAGNOSIS — I63541 Cerebral infarction due to unspecified occlusion or stenosis of right cerebellar artery: Secondary | ICD-10-CM | POA: Diagnosis not present

## 2024-01-09 DIAGNOSIS — K219 Gastro-esophageal reflux disease without esophagitis: Secondary | ICD-10-CM | POA: Diagnosis present

## 2024-01-09 DIAGNOSIS — Z7984 Long term (current) use of oral hypoglycemic drugs: Secondary | ICD-10-CM

## 2024-01-09 DIAGNOSIS — I252 Old myocardial infarction: Secondary | ICD-10-CM

## 2024-01-09 DIAGNOSIS — Z5982 Transportation insecurity: Secondary | ICD-10-CM

## 2024-01-09 DIAGNOSIS — R29709 NIHSS score 9: Secondary | ICD-10-CM | POA: Diagnosis present

## 2024-01-09 DIAGNOSIS — I214 Non-ST elevation (NSTEMI) myocardial infarction: Secondary | ICD-10-CM | POA: Diagnosis not present

## 2024-01-09 DIAGNOSIS — Z6835 Body mass index (BMI) 35.0-35.9, adult: Secondary | ICD-10-CM

## 2024-01-09 DIAGNOSIS — I2510 Atherosclerotic heart disease of native coronary artery without angina pectoris: Secondary | ICD-10-CM | POA: Diagnosis present

## 2024-01-09 DIAGNOSIS — E785 Hyperlipidemia, unspecified: Secondary | ICD-10-CM | POA: Diagnosis present

## 2024-01-09 DIAGNOSIS — I69354 Hemiplegia and hemiparesis following cerebral infarction affecting left non-dominant side: Secondary | ICD-10-CM

## 2024-01-09 DIAGNOSIS — Z6834 Body mass index (BMI) 34.0-34.9, adult: Secondary | ICD-10-CM

## 2024-01-09 DIAGNOSIS — R29810 Facial weakness: Secondary | ICD-10-CM | POA: Diagnosis not present

## 2024-01-09 DIAGNOSIS — E669 Obesity, unspecified: Secondary | ICD-10-CM | POA: Diagnosis present

## 2024-01-09 DIAGNOSIS — Z885 Allergy status to narcotic agent status: Secondary | ICD-10-CM

## 2024-01-09 DIAGNOSIS — Z91018 Allergy to other foods: Secondary | ICD-10-CM

## 2024-01-09 DIAGNOSIS — F419 Anxiety disorder, unspecified: Secondary | ICD-10-CM | POA: Diagnosis present

## 2024-01-09 DIAGNOSIS — Z5941 Food insecurity: Secondary | ICD-10-CM

## 2024-01-09 DIAGNOSIS — D869 Sarcoidosis, unspecified: Secondary | ICD-10-CM | POA: Diagnosis present

## 2024-01-09 DIAGNOSIS — I639 Cerebral infarction, unspecified: Secondary | ICD-10-CM | POA: Diagnosis present

## 2024-01-09 DIAGNOSIS — I25110 Atherosclerotic heart disease of native coronary artery with unstable angina pectoris: Secondary | ICD-10-CM | POA: Diagnosis present

## 2024-01-09 DIAGNOSIS — E1165 Type 2 diabetes mellitus with hyperglycemia: Secondary | ICD-10-CM | POA: Diagnosis present

## 2024-01-09 DIAGNOSIS — Z955 Presence of coronary angioplasty implant and graft: Secondary | ICD-10-CM

## 2024-01-09 DIAGNOSIS — Z8249 Family history of ischemic heart disease and other diseases of the circulatory system: Secondary | ICD-10-CM

## 2024-01-09 DIAGNOSIS — Z5986 Financial insecurity: Secondary | ICD-10-CM

## 2024-01-09 DIAGNOSIS — Z79899 Other long term (current) drug therapy: Secondary | ICD-10-CM

## 2024-01-09 DIAGNOSIS — Z886 Allergy status to analgesic agent status: Secondary | ICD-10-CM

## 2024-01-09 DIAGNOSIS — G8311 Monoplegia of lower limb affecting right dominant side: Secondary | ICD-10-CM | POA: Diagnosis not present

## 2024-01-09 DIAGNOSIS — I10 Essential (primary) hypertension: Secondary | ICD-10-CM | POA: Diagnosis present

## 2024-01-09 DIAGNOSIS — Z5948 Other specified lack of adequate food: Secondary | ICD-10-CM

## 2024-01-09 DIAGNOSIS — I249 Acute ischemic heart disease, unspecified: Secondary | ICD-10-CM | POA: Diagnosis present

## 2024-01-09 DIAGNOSIS — Y838 Other surgical procedures as the cause of abnormal reaction of the patient, or of later complication, without mention of misadventure at the time of the procedure: Secondary | ICD-10-CM | POA: Diagnosis not present

## 2024-01-09 DIAGNOSIS — G9340 Encephalopathy, unspecified: Secondary | ICD-10-CM | POA: Diagnosis not present

## 2024-01-09 DIAGNOSIS — R1310 Dysphagia, unspecified: Secondary | ICD-10-CM | POA: Diagnosis present

## 2024-01-09 DIAGNOSIS — G8929 Other chronic pain: Secondary | ICD-10-CM | POA: Diagnosis present

## 2024-01-09 MED ORDER — NITROGLYCERIN 2 % TD OINT
0.5000 [in_us] | TOPICAL_OINTMENT | Freq: Once | TRANSDERMAL | Status: AC
  Administered 2024-01-09: 0.5 [in_us] via TOPICAL
  Filled 2024-01-09: qty 1

## 2024-01-09 NOTE — ED Provider Notes (Addendum)
 [place] EMERGENCY DEPARTMENT AT [facility] Provider Note   CSN: [id number] Arrival date & time: 01/09/24  2225     History  Chief Complaint  Patient presents with   Chest Pain    [name] is a 58 y.o. female.  He has history of CAD, type 2 diabetes, hypertension, left lower extremity DVT diagnosed July 2023, stroke with left-sided weakness.  She had PCI to RPDA on May 08, 2022.  She is on Plavix daily.  Presents to ER today complaining of chest pain, states it was intermittent all day but became persistent around 9 PM.  She took nitroglycerin at home which slightly improved her pain.  Upon arrival to the ED when she walked to the bathroom her pain returned.  Radiates to both arms and her neck.  Denies abdominal pain nausea vomiting or diaphoresis.  Reports vision, small amount of swelling in her lower legs.  She does have heart failure and is on Lasix daily which she has been compliant with.      Chest Pain      Home Medications Prior to Admission medications   Medication Sig Start Date End Date Taking? Authorizing Provider  albuterol (VENTOLIN HFA) 108 (90 Base) MCG/ACT inhaler INHALE 2 PUFFS BY MOUTH EVERY 6 HOURS AS NEEDED FOR SHORTNESS OF BREATH OR WHEEZING 07/09/22  Yes Newlin, Enobong, MD  Blood Glucose Monitoring Suppl (TRUE METRIX METER) w/Device KIT use as directed three times a day 03/11/23  Yes Newlin, Odette Horns, MD  carvedilol (COREG) 25 MG tablet Take 1 tablet (25 mg total) by mouth 2 (two) times daily with a meal. 12/23/23  Yes Newlin, Enobong, MD  clopidogrel (PLAVIX) 75 MG tablet Take 1 tablet (75 mg total) by mouth daily. 12/23/23  Yes Hoy Register, MD  diclofenac Sodium (VOLTAREN) 1 % GEL Apply 2 g topically 2 (two) times daily as needed. 09/01/22  Yes Marcine Matar, MD  DULoxetine (CYMBALTA) 60 MG capsule Take 1 capsule (60 mg total) by mouth daily. 12/23/23  Yes Hoy Register, MD  famotidine (PEPCID) 20 MG tablet TAKE 1 TABLET BY MOUTH  TWICE DAILY 06/12/23  Yes Cleaver, Thomasene Ripple, NP  furosemide (LASIX) 20 MG tablet Take 1 tablet (20 mg total) by mouth daily. 11/02/23  Yes Cleaver, Thomasene Ripple, NP  glipiZIDE (GLUCOTROL) 10 MG tablet Take 1 tablet (10 mg total) by mouth 2 (two) times daily before a meal. 12/23/23  Yes Newlin, Enobong, MD  glucose blood (ACCU-CHEK GUIDE) test strip Use as instructed to check blood sugar three times daily. 03/11/23  Yes Newlin, Odette Horns, MD  insulin glargine (LANTUS SOLOSTAR) 100 UNIT/ML Solostar Pen Inject 25 Units into the skin 2 (two) times daily. 12/23/23  Yes Newlin, Odette Horns, MD  Insulin Pen Needle (B-D UF III MINI PEN NEEDLES) 31G X 5 MM MISC USE AS DIRECTED 2 TIMES A DAY 09/04/22  Yes McClung, Angela M, PA-C  isosorbide mononitrate (IMDUR) 60 MG 24 hr tablet Take 1 tablet (60 mg total) by mouth daily. 12/23/23  Yes Hoy Register, MD  losartan (COZAAR) 50 MG tablet Take 1 tablet (50 mg total) by mouth at bedtime. Patient taking differently: Take 50 mg by mouth daily. 12/23/23  Yes Newlin, Odette Horns, MD  nitroGLYCERIN (NITROSTAT) 0.4 MG SL tablet PLACE 1 TABLET UNDER THE TOUNGE EVERY 5 MINS  AS NEEDED FOR CHEST PAIN 04/22/22  Yes   TRUEplus Lancets 28G MISC use as directed mto test blood sugars 3 times a day 03/11/23  Yes Newlin,  Enobong, MD  atorvastatin (LIPITOR) 80 MG tablet Take 1 tablet (80 mg total) by mouth daily. Patient not taking: Reported on 01/10/2024 12/23/23   Hoy Register, MD  lisinopril (ZESTRIL) 20 MG tablet Take 1 tablet (20 mg total) by mouth daily. 10/11/20 12/06/20  Ante Ishikawa, MD  omeprazole (PRILOSEC) 40 MG capsule Take 1 capsule (40 mg total) by mouth daily. 07/28/20 10/30/20  Hall-Potvin, [place], PA-C      Allergies    Aspirin, Tramadol, Hydrocodone, Ibuprofen, Morphine and codeine, and Orange fruit [citrus]    Review of Systems   Review of Systems  Cardiovascular:  Positive for chest pain.    Physical Exam Updated Vital Signs BP (!) 182/90   Pulse 82   Temp 98.2 F  (36.8 C) (Oral)   Resp 20   Ht 5\' 5"  (1.651 m)   Wt 98.1 kg   LMP 01/24/2015 (Approximate)   SpO2 99%   BMI 35.99 kg/m  Physical Exam Vitals and nursing note reviewed.  Constitutional:      General: She is not in acute distress.    Appearance: She is well-developed.  HENT:     Head: Normocephalic and atraumatic.  Eyes:     Conjunctiva/sclera: Conjunctivae normal.  Cardiovascular:     Rate and Rhythm: Normal rate and regular rhythm.     Heart sounds: No murmur heard. Pulmonary:     Effort: Pulmonary effort is normal. No respiratory distress.     Breath sounds: Normal breath sounds.  Chest:     Chest wall: Tenderness present.  Abdominal:     Palpations: Abdomen is soft.     Tenderness: There is no abdominal tenderness.  Musculoskeletal:        General: No swelling.     Cervical back: Neck supple.     Comments: Trace edema in bilateral legs, no calf tenderness, no asymmetrical swelling  Skin:    General: Skin is warm and dry.     Capillary Refill: Capillary refill takes less than 2 seconds.  Neurological:     General: No focal deficit present.     Mental Status: She is alert and oriented to person, place, and time.  Psychiatric:        Mood and Affect: Mood normal.     ED Results / Procedures / Treatments   Labs (all labs ordered are listed, but only abnormal results are displayed) Labs Reviewed  BASIC METABOLIC PANEL - Abnormal; Notable for the following components:      Result Value   Glucose, Bld 422 (*)    All other components within normal limits  HEMOGLOBIN A1C - Abnormal; Notable for the following components:   Hgb A1c MFr Bld 12.3 (*)    All other components within normal limits  BASIC METABOLIC PANEL - Abnormal; Notable for the following components:   Glucose, Bld 378 (*)    All other components within normal limits  LIPID PANEL - Abnormal; Notable for the following components:   Cholesterol 249 (*)    Triglycerides 361 (*)    VLDL 72 (*)    LDL  Cholesterol 135 (*)    All other components within normal limits  TROPONIN I (HIGH SENSITIVITY) - Abnormal; Notable for the following components:   Troponin I (High Sensitivity) 20 (*)    All other components within normal limits  TROPONIN I (HIGH SENSITIVITY) - Abnormal; Notable for the following components:   Troponin I (High Sensitivity) 21 (*)    All other components within  normal limits  CBC  TSH  CBC WITH DIFFERENTIAL/PLATELET  MAGNESIUM  HIV ANTIBODY (ROUTINE TESTING W REFLEX)  LIPOPROTEIN A (LPA)  HEPARIN LEVEL (UNFRACTIONATED)  TROPONIN I (HIGH SENSITIVITY)    EKG EKG Interpretation Date/Time:  Saturday January 09 2024 22:40:08 EST Ventricular Rate:  89 PR Interval:  169 QRS Duration:  99 QT Interval:  384 QTC Calculation: 468 R Axis:   -41  Text Interpretation: Sinus rhythm Left ventricular hypertrophy Anterior Q waves, possibly due to LVH Confirmed by Tilden Fossa [phone]) on 01/10/2024 12:10:58 AM  Radiology DG Chest Port 1 View Result Date: 01/09/2024 CLINICAL DATA:  Chest pain for 2 days. EXAM: PORTABLE CHEST 1 VIEW COMPARISON:  Radiograph and CT January 2024 FINDINGS: The cardiomediastinal contours are normal. The lungs are clear. Pulmonary vasculature is normal. No consolidation, pleural effusion, or pneumothorax. No acute osseous abnormalities are seen. IMPRESSION: No active disease. Electronically Signed   By: Narda Rutherford M.D.   On: 01/09/2024 23:23    Procedures .Critical Care  Performed by: Ma Rings, PA-C Authorized by: Ma Rings, PA-C   Critical care provider statement:    Critical care time (minutes):  30   Critical care was necessary to treat or prevent imminent or life-threatening deterioration of the following conditions: ACS/unstable angina.   Critical care was time spent personally by me on the following activities:  Development of treatment plan with patient or surrogate, discussions with consultants, evaluation of patient's  response to treatment, examination of patient, ordering and review of laboratory studies, ordering and review of radiographic studies, ordering and performing treatments and interventions, pulse oximetry, re-evaluation of patient's condition and review of old charts   Care discussed with: admitting provider   Comments:     I rechecked patient multiple times, re evaluated pain, discussed with cardiology for concern for ACS.      Medications Ordered in ED Medications  insulin aspart (novoLOG) injection 0-15 Units (has no administration in time range)  insulin aspart (novoLOG) injection 0-5 Units (has no administration in time range)  atorvastatin (LIPITOR) tablet 80 mg (has no administration in time range)  carvedilol (COREG) tablet 25 mg (has no administration in time range)  furosemide (LASIX) tablet 20 mg (has no administration in time range)  losartan (COZAAR) tablet 50 mg (has no administration in time range)  DULoxetine (CYMBALTA) DR capsule 60 mg (has no administration in time range)  insulin glargine (LANTUS) injection 25 Units (has no administration in time range)  famotidine (PEPCID) tablet 20 mg (has no administration in time range)  clopidogrel (PLAVIX) tablet 75 mg (has no administration in time range)  acetaminophen (TYLENOL) tablet 650 mg (has no administration in time range)  ondansetron (ZOFRAN) injection 4 mg (has no administration in time range)  nitroGLYCERIN 50 mg in dextrose 5 % 250 mL (0.2 mg/mL) infusion (5 mcg/min Intravenous New Bag/Given 01/10/24 0501)  heparin ADULT infusion 100 units/mL (25000 units/258mL) (950 Units/hr Intravenous New Bag/Given 01/10/24 0503)  nitroGLYCERIN (NITROGLYN) 2 % ointment 0.5 inch (0.5 inches Topical Given 01/09/24 2335)  fentaNYL (SUBLIMAZE) injection 50 mcg (50 mcg Intravenous Given 01/10/24 0320)  labetalol (NORMODYNE) injection 10 mg (10 mg Intravenous Given 01/10/24 0417)  lactated ringers bolus 500 mL (500 mLs Intravenous New Bag/Given  01/10/24 0423)  insulin aspart (novoLOG) injection 10 Units (10 Units Subcutaneous Given 01/10/24 0419)  potassium chloride (KLOR-CON) packet 40 mEq (40 mEq Oral Given 01/10/24 0420)  heparin bolus via infusion 4,000 Units (4,000 Units Intravenous Bolus from Bag  01/10/24 0503)    ED Course/ Medical Decision Making/ A&P                                 Medical Decision Making The patient presented today for chest pain that had started this morning. EKG showed no acute changes, Chest Xray was independently reviewed by me and shows no pulm edema, no infiltrate. I agree with radiology interpretation.   They were given nitroglycerin for their symptoms, and on repeat evaluation their symptoms are unchanged  I considered a broad differential including but not limited to ACS, PE, Dissection, pneumothorax, costochondritis, pneumonia, GERD, pericarditis, and pericardial effusion.  PE is considered unlikely  I feel disssection is very unlikely, doubt PE as patient is not tachycardic, tachypneic or hypoxic.  Pain is consistent with prior heart attack per patient  No PR depression or ST elevation to suggest pericarditis  Their heart score is 6. Troponins show delta of 1  Plan is for her to cardiology.  I consulted cardiology and spoke with Dr. Braxton Feathers.  As patient is having chest pain consistent with prior ACS, at this time not consistent presentation for dissection, PE or noncardiac cause otherwise she will be admitted to cardiology service.  Pressure initially improved with nitroglycerin but is again elevated, labetalol ordered.   Amount and/or Complexity of Data Reviewed Labs: ordered. Radiology: ordered.  Risk Prescription drug management. Decision regarding hospitalization.           Final Clinical Impression(s) / ED Diagnoses Final diagnoses:  None    Rx / DC Orders ED Discharge Orders     None         Ma Rings, PA-C 01/10/24 0654    Ma Rings,  PA-C 01/10/24 1610    Tilden Fossa, MD 01/10/24 0724    Ma Rings, PA-C 02/06/24 1553    Tilden Fossa, MD 02/06/24 [phone]

## 2024-01-09 NOTE — ED Triage Notes (Signed)
 Pt BIB [facility] from home with chest pain x 2 days radiating to her abdomen. Denies any other s/s at time of triage.  18g LAC Pt took 3 nitroglycerin SL PTA with some relief

## 2024-01-10 ENCOUNTER — Encounter (HOSPITAL_COMMUNITY): Payer: Self-pay | Admitting: Cardiology

## 2024-01-10 DIAGNOSIS — E1169 Type 2 diabetes mellitus with other specified complication: Secondary | ICD-10-CM | POA: Diagnosis present

## 2024-01-10 DIAGNOSIS — R4701 Aphasia: Secondary | ICD-10-CM | POA: Diagnosis not present

## 2024-01-10 DIAGNOSIS — Z794 Long term (current) use of insulin: Secondary | ICD-10-CM | POA: Diagnosis not present

## 2024-01-10 DIAGNOSIS — I10 Essential (primary) hypertension: Secondary | ICD-10-CM | POA: Diagnosis not present

## 2024-01-10 DIAGNOSIS — R29810 Facial weakness: Secondary | ICD-10-CM | POA: Diagnosis not present

## 2024-01-10 DIAGNOSIS — K219 Gastro-esophageal reflux disease without esophagitis: Secondary | ICD-10-CM | POA: Diagnosis present

## 2024-01-10 DIAGNOSIS — I25110 Atherosclerotic heart disease of native coronary artery with unstable angina pectoris: Secondary | ICD-10-CM | POA: Diagnosis not present

## 2024-01-10 DIAGNOSIS — Z833 Family history of diabetes mellitus: Secondary | ICD-10-CM | POA: Diagnosis not present

## 2024-01-10 DIAGNOSIS — I214 Non-ST elevation (NSTEMI) myocardial infarction: Secondary | ICD-10-CM | POA: Diagnosis present

## 2024-01-10 DIAGNOSIS — R519 Headache, unspecified: Secondary | ICD-10-CM | POA: Diagnosis not present

## 2024-01-10 DIAGNOSIS — E1165 Type 2 diabetes mellitus with hyperglycemia: Secondary | ICD-10-CM | POA: Diagnosis present

## 2024-01-10 DIAGNOSIS — Z79899 Other long term (current) drug therapy: Secondary | ICD-10-CM | POA: Diagnosis not present

## 2024-01-10 DIAGNOSIS — Z886 Allergy status to analgesic agent status: Secondary | ICD-10-CM | POA: Diagnosis not present

## 2024-01-10 DIAGNOSIS — I6389 Other cerebral infarction: Secondary | ICD-10-CM | POA: Diagnosis not present

## 2024-01-10 DIAGNOSIS — Z6834 Body mass index (BMI) 34.0-34.9, adult: Secondary | ICD-10-CM | POA: Diagnosis not present

## 2024-01-10 DIAGNOSIS — Z885 Allergy status to narcotic agent status: Secondary | ICD-10-CM | POA: Diagnosis not present

## 2024-01-10 DIAGNOSIS — E119 Type 2 diabetes mellitus without complications: Secondary | ICD-10-CM | POA: Diagnosis not present

## 2024-01-10 DIAGNOSIS — I160 Hypertensive urgency: Secondary | ICD-10-CM | POA: Diagnosis not present

## 2024-01-10 DIAGNOSIS — I69354 Hemiplegia and hemiparesis following cerebral infarction affecting left non-dominant side: Secondary | ICD-10-CM | POA: Diagnosis not present

## 2024-01-10 DIAGNOSIS — I249 Acute ischemic heart disease, unspecified: Secondary | ICD-10-CM | POA: Diagnosis not present

## 2024-01-10 DIAGNOSIS — R410 Disorientation, unspecified: Secondary | ICD-10-CM | POA: Diagnosis not present

## 2024-01-10 DIAGNOSIS — Y838 Other surgical procedures as the cause of abnormal reaction of the patient, or of later complication, without mention of misadventure at the time of the procedure: Secondary | ICD-10-CM | POA: Diagnosis not present

## 2024-01-10 DIAGNOSIS — G9340 Encephalopathy, unspecified: Secondary | ICD-10-CM | POA: Diagnosis not present

## 2024-01-10 DIAGNOSIS — F32A Depression, unspecified: Secondary | ICD-10-CM | POA: Diagnosis present

## 2024-01-10 DIAGNOSIS — I97821 Postprocedural cerebrovascular infarction during other surgery: Secondary | ICD-10-CM | POA: Diagnosis not present

## 2024-01-10 DIAGNOSIS — I6340 Cerebral infarction due to embolism of unspecified cerebral artery: Secondary | ICD-10-CM | POA: Diagnosis not present

## 2024-01-10 DIAGNOSIS — Z8249 Family history of ischemic heart disease and other diseases of the circulatory system: Secondary | ICD-10-CM | POA: Diagnosis not present

## 2024-01-10 DIAGNOSIS — R29702 NIHSS score 2: Secondary | ICD-10-CM | POA: Diagnosis not present

## 2024-01-10 DIAGNOSIS — I63541 Cerebral infarction due to unspecified occlusion or stenosis of right cerebellar artery: Secondary | ICD-10-CM | POA: Diagnosis not present

## 2024-01-10 DIAGNOSIS — E785 Hyperlipidemia, unspecified: Secondary | ICD-10-CM | POA: Diagnosis present

## 2024-01-10 DIAGNOSIS — Z7984 Long term (current) use of oral hypoglycemic drugs: Secondary | ICD-10-CM | POA: Diagnosis not present

## 2024-01-10 DIAGNOSIS — Z955 Presence of coronary angioplasty implant and graft: Secondary | ICD-10-CM | POA: Diagnosis not present

## 2024-01-10 DIAGNOSIS — G8929 Other chronic pain: Secondary | ICD-10-CM | POA: Diagnosis present

## 2024-01-10 DIAGNOSIS — G8311 Monoplegia of lower limb affecting right dominant side: Secondary | ICD-10-CM | POA: Diagnosis not present

## 2024-01-10 DIAGNOSIS — I2510 Atherosclerotic heart disease of native coronary artery without angina pectoris: Secondary | ICD-10-CM | POA: Diagnosis present

## 2024-01-10 LAB — CBC WITH DIFFERENTIAL/PLATELET
Abs Immature Granulocytes: 0.01 10*3/uL (ref 0.00–0.07)
Basophils Absolute: 0.1 10*3/uL (ref 0.0–0.1)
Basophils Relative: 1 %
Eosinophils Absolute: 0.1 10*3/uL (ref 0.0–0.5)
Eosinophils Relative: 1 %
HCT: 43.1 % (ref 36.0–46.0)
Hemoglobin: 14.6 g/dL (ref 12.0–15.0)
Immature Granulocytes: 0 %
Lymphocytes Relative: 29 %
Lymphs Abs: 1.9 10*3/uL (ref 0.7–4.0)
MCH: 33.5 pg (ref 26.0–34.0)
MCHC: 33.9 g/dL (ref 30.0–36.0)
MCV: 98.9 fL (ref 80.0–100.0)
Monocytes Absolute: 0.5 10*3/uL (ref 0.1–1.0)
Monocytes Relative: 7 %
Neutro Abs: 4.1 10*3/uL (ref 1.7–7.7)
Neutrophils Relative %: 62 %
Platelets: 211 10*3/uL (ref 150–400)
RBC: 4.36 MIL/uL (ref 3.87–5.11)
RDW: 12.7 % (ref 11.5–15.5)
WBC: 6.6 10*3/uL (ref 4.0–10.5)
nRBC: 0 % (ref 0.0–0.2)

## 2024-01-10 LAB — CBC
HCT: 39.7 % (ref 36.0–46.0)
Hemoglobin: 13.6 g/dL (ref 12.0–15.0)
MCH: 33.5 pg (ref 26.0–34.0)
MCHC: 34.3 g/dL (ref 30.0–36.0)
MCV: 97.8 fL (ref 80.0–100.0)
Platelets: 201 10*3/uL (ref 150–400)
RBC: 4.06 MIL/uL (ref 3.87–5.11)
RDW: 12.8 % (ref 11.5–15.5)
WBC: 7.4 10*3/uL (ref 4.0–10.5)
nRBC: 0 % (ref 0.0–0.2)

## 2024-01-10 LAB — BASIC METABOLIC PANEL
Anion gap: 10 (ref 5–15)
Anion gap: 10 (ref 5–15)
BUN: 13 mg/dL (ref 6–20)
BUN: 16 mg/dL (ref 6–20)
CO2: 24 mmol/L (ref 22–32)
CO2: 26 mmol/L (ref 22–32)
Calcium: 9.1 mg/dL (ref 8.9–10.3)
Calcium: 9.3 mg/dL (ref 8.9–10.3)
Chloride: 103 mmol/L (ref 98–111)
Chloride: 105 mmol/L (ref 98–111)
Creatinine, Ser: 0.8 mg/dL (ref 0.44–1.00)
Creatinine, Ser: 0.86 mg/dL (ref 0.44–1.00)
GFR, Estimated: >60 mL/min (ref >60)
GFR, Estimated: >60 mL/min (ref >60)
Glucose, Bld: 378 mg/dL — ABNORMAL HIGH (ref 70–99)
Glucose, Bld: 422 mg/dL — ABNORMAL HIGH (ref 70–99)
Potassium: 3.8 mmol/L (ref 3.5–5.1)
Potassium: 4.1 mmol/L (ref 3.5–5.1)
Sodium: 139 mmol/L (ref 135–145)
Sodium: 139 mmol/L (ref 135–145)

## 2024-01-10 LAB — HEMOGLOBIN A1C
Hgb A1c MFr Bld: 12.3 % — ABNORMAL HIGH (ref 4.8–5.6)
Mean Plasma Glucose: 306.31 mg/dL

## 2024-01-10 LAB — TSH: TSH: 1.539 u[IU]/mL (ref 0.350–4.500)

## 2024-01-10 LAB — TROPONIN I (HIGH SENSITIVITY)
Troponin I (High Sensitivity): 15 ng/L (ref <18)
Troponin I (High Sensitivity): 20 ng/L — ABNORMAL HIGH (ref <18)
Troponin I (High Sensitivity): 20 ng/L — ABNORMAL HIGH (ref <18)
Troponin I (High Sensitivity): 21 ng/L — ABNORMAL HIGH (ref <18)

## 2024-01-10 LAB — CBG MONITORING, ED
Glucose-Capillary: 288 mg/dL — ABNORMAL HIGH (ref 70–99)
Glucose-Capillary: 264 mg/dL — ABNORMAL HIGH (ref 70–99)
Glucose-Capillary: 284 mg/dL — ABNORMAL HIGH (ref 70–99)
Glucose-Capillary: 342 mg/dL — ABNORMAL HIGH (ref 70–99)

## 2024-01-10 LAB — LIPID PANEL
Cholesterol: 249 mg/dL — ABNORMAL HIGH (ref 0–200)
HDL: 42 mg/dL (ref >40)
LDL Cholesterol: 135 mg/dL — ABNORMAL HIGH (ref 0–99)
Total CHOL/HDL Ratio: 5.9 ratio
Triglycerides: 361 mg/dL — ABNORMAL HIGH (ref <150)
VLDL: 72 mg/dL — ABNORMAL HIGH (ref 0–40)

## 2024-01-10 LAB — HEPARIN LEVEL (UNFRACTIONATED)
Heparin Unfractionated: <0.1 [IU]/mL — ABNORMAL LOW (ref 0.30–0.70)
Heparin Unfractionated: 0.1 [IU]/mL — ABNORMAL LOW (ref 0.30–0.70)

## 2024-01-10 LAB — MAGNESIUM: Magnesium: 2 mg/dL (ref 1.7–2.4)

## 2024-01-10 LAB — HIV ANTIBODY (ROUTINE TESTING W REFLEX): HIV Screen 4th Generation wRfx: NONREACTIVE

## 2024-01-10 MED ORDER — LACTATED RINGERS IV BOLUS
500.0000 mL | Freq: Once | INTRAVENOUS | Status: AC
  Administered 2024-01-10: 500 mL via INTRAVENOUS

## 2024-01-10 MED ORDER — FAMOTIDINE 20 MG PO TABS
20.0000 mg | ORAL_TABLET | Freq: Two times a day (BID) | ORAL | Status: DC
  Administered 2024-01-10 – 2024-01-13 (×5): 20 mg via ORAL
  Filled 2024-01-10 (×5): qty 1

## 2024-01-10 MED ORDER — LOSARTAN POTASSIUM 50 MG PO TABS
50.0000 mg | ORAL_TABLET | Freq: Every day | ORAL | Status: DC
  Administered 2024-01-10: 50 mg via ORAL
  Filled 2024-01-10: qty 1

## 2024-01-10 MED ORDER — INSULIN ASPART 100 UNIT/ML IJ SOLN
0.0000 [IU] | Freq: Three times a day (TID) | INTRAMUSCULAR | Status: DC
Start: 2024-01-10 — End: 2024-01-13
  Administered 2024-01-10 (×3): 8 [IU] via SUBCUTANEOUS
  Administered 2024-01-11: 11 [IU] via SUBCUTANEOUS
  Administered 2024-01-11: 8 [IU] via SUBCUTANEOUS
  Administered 2024-01-12: 3 [IU] via SUBCUTANEOUS
  Administered 2024-01-12: 8 [IU] via SUBCUTANEOUS
  Administered 2024-01-12 – 2024-01-13 (×2): 3 [IU] via SUBCUTANEOUS
  Administered 2024-01-13: 8 [IU] via SUBCUTANEOUS

## 2024-01-10 MED ORDER — ONDANSETRON HCL 4 MG/2ML IJ SOLN
4.0000 mg | Freq: Four times a day (QID) | INTRAMUSCULAR | Status: DC | PRN
  Administered 2024-01-11: 4 mg via INTRAVENOUS

## 2024-01-10 MED ORDER — HEPARIN (PORCINE) 25000 UT/250ML-% IV SOLN
1200.0000 [IU]/h | INTRAVENOUS | Status: DC
  Administered 2024-01-10: 1200 [IU]/h via INTRAVENOUS
  Administered 2024-01-10: 950 [IU]/h via INTRAVENOUS
  Filled 2024-01-10 (×2): qty 250

## 2024-01-10 MED ORDER — ACETAMINOPHEN 325 MG PO TABS
650.0000 mg | ORAL_TABLET | ORAL | Status: DC | PRN
  Administered 2024-01-10 – 2024-01-12 (×3): 650 mg via ORAL
  Filled 2024-01-10 (×2): qty 2

## 2024-01-10 MED ORDER — HEPARIN BOLUS VIA INFUSION
4000.0000 [IU] | Freq: Once | INTRAVENOUS | Status: AC
  Administered 2024-01-10: 4000 [IU] via INTRAVENOUS
  Filled 2024-01-10: qty 4000

## 2024-01-10 MED ORDER — INSULIN ASPART 100 UNIT/ML IJ SOLN
0.0000 [IU] | Freq: Every day | INTRAMUSCULAR | Status: DC
  Administered 2024-01-10: 4 [IU] via SUBCUTANEOUS

## 2024-01-10 MED ORDER — FENTANYL CITRATE PF 50 MCG/ML IJ SOSY
50.0000 ug | PREFILLED_SYRINGE | Freq: Once | INTRAMUSCULAR | Status: AC
  Administered 2024-01-10: 50 ug via INTRAVENOUS
  Filled 2024-01-10: qty 1

## 2024-01-10 MED ORDER — CLOPIDOGREL BISULFATE 75 MG PO TABS
75.0000 mg | ORAL_TABLET | Freq: Every day | ORAL | Status: DC
  Administered 2024-01-10 – 2024-01-11 (×2): 75 mg via ORAL
  Filled 2024-01-10 (×2): qty 1

## 2024-01-10 MED ORDER — ATORVASTATIN CALCIUM 80 MG PO TABS
80.0000 mg | ORAL_TABLET | Freq: Every day | ORAL | Status: DC
  Administered 2024-01-10 – 2024-01-12 (×2): 80 mg via ORAL
  Filled 2024-01-10: qty 1
  Filled 2024-01-10: qty 2

## 2024-01-10 MED ORDER — FUROSEMIDE 20 MG PO TABS
20.0000 mg | ORAL_TABLET | Freq: Every day | ORAL | Status: DC
  Administered 2024-01-10: 20 mg via ORAL
  Filled 2024-01-10: qty 1

## 2024-01-10 MED ORDER — POTASSIUM CHLORIDE 20 MEQ PO PACK
40.0000 meq | PACK | Freq: Once | ORAL | Status: AC
Start: 2024-01-10 — End: 2024-01-10
  Administered 2024-01-10: 40 meq via ORAL
  Filled 2024-01-10: qty 2

## 2024-01-10 MED ORDER — INSULIN GLARGINE 100 UNIT/ML ~~LOC~~ SOLN
25.0000 [IU] | Freq: Two times a day (BID) | SUBCUTANEOUS | Status: DC
  Administered 2024-01-10 – 2024-01-12 (×6): 25 [IU] via SUBCUTANEOUS
  Filled 2024-01-10 (×8): qty 0.25

## 2024-01-10 MED ORDER — NITROGLYCERIN IN D5W 200-5 MCG/ML-% IV SOLN
0.0000 ug/min | INTRAVENOUS | Status: DC
  Administered 2024-01-10: 5 ug/min via INTRAVENOUS
  Filled 2024-01-10: qty 250

## 2024-01-10 MED ORDER — INSULIN ASPART 100 UNIT/ML IJ SOLN
10.0000 [IU] | Freq: Once | INTRAMUSCULAR | Status: AC
  Administered 2024-01-10: 10 [IU] via SUBCUTANEOUS

## 2024-01-10 MED ORDER — DULOXETINE HCL 60 MG PO CPEP
60.0000 mg | ORAL_CAPSULE | Freq: Every day | ORAL | Status: DC
  Administered 2024-01-10 – 2024-01-13 (×3): 60 mg via ORAL
  Filled 2024-01-10: qty 2
  Filled 2024-01-10 (×2): qty 1

## 2024-01-10 MED ORDER — CARVEDILOL 25 MG PO TABS
25.0000 mg | ORAL_TABLET | Freq: Two times a day (BID) | ORAL | Status: DC
  Administered 2024-01-10 – 2024-01-11 (×3): 25 mg via ORAL
  Filled 2024-01-10 (×3): qty 2

## 2024-01-10 MED ORDER — LABETALOL HCL 5 MG/ML IV SOLN
10.0000 mg | Freq: Once | INTRAVENOUS | Status: AC
Start: 2024-01-10 — End: 2024-01-10
  Administered 2024-01-10: 10 mg via INTRAVENOUS
  Filled 2024-01-10: qty 4

## 2024-01-10 MED ORDER — HEPARIN BOLUS VIA INFUSION
2000.0000 [IU] | Freq: Once | INTRAVENOUS | Status: AC
  Administered 2024-01-10: 2000 [IU] via INTRAVENOUS
  Filled 2024-01-10: qty 2000
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
## 2024-01-10 NOTE — Progress Notes (Signed)
 PHARMACY - ANTICOAGULATION CONSULT NOTE  Pharmacy Consult for IV heparin Indication: chest pain/ACS  Allergies  Allergen Reactions   Aspirin Anaphylaxis and Swelling    Tongue swells   Tramadol Hives   Hydrocodone Rash   Ibuprofen Itching   Morphine And Codeine Itching   Orange Fruit [Citrus] Rash    Patient Measurements: Height: 5\' 5"  (165.1 cm) Weight: 98.1 kg (216 lb 4.3 oz) IBW/kg (Calculated) : 57 Heparin Dosing Weight: 79.3 kg  Vital Signs: Temp: 98.2 F (36.8 C) (03/09 0322) Temp Source: Oral (03/09 0322) BP: 191/83 (03/09 0320) Pulse Rate: 87 (03/09 0320)  Labs: Recent Labs    01/09/24 2331 01/10/24 0115  HGB 13.6  --   HCT 39.7  --   PLT 201  --   CREATININE 0.80  --   TROPONINIHS 20* 21*    Estimated Creatinine Clearance: 87.7 mL/min (by C-G formula based on SCr of 0.8 mg/dL).   Medical History: Past Medical History:  Diagnosis Date   Anxiety    Coronary artery disease    Diabetes mellitus    Embolism (HCC)    Hypertension    Sarcoidosis     Assessment: ZIONNA HOMEWOOD is a 58 y.o. year old female admitted on 01/09/2024 with concern for ACS. No anticoagulation prior to admission. Troponin 20 >21 and currently on nitroglycerin drip. Pharmacy consulted to dose heparin.  Goal of Therapy:  Heparin level 0.3-0.7 units/ml Monitor platelets by anticoagulation protocol: Yes   Plan:  Heparin 4000 units x 1 as bolus followed by heparin infusion at 950 units/hr 6 heparin level  Daily heparin level, CBC, and monitoring for bleeding F/u plans for anticoagulation and cards recs  Thank you for allowing pharmacy to participate in this patient's care.  Marja Kays, PharmD Emergency Medicine Clinical Pharmacist 01/10/2024,4:42 AM

## 2024-01-10 NOTE — Progress Notes (Signed)
 Follow up note, patient admitted early this AM by overnight fellow   1.Chest pain/history of CAD - History of CAD with prior interventions presents with chest pain, concerns for ACS.  - Mild flat troponins, EKG SR nonspecific ST/T changes.  - echo pending  - medical therapy with plavix (severe ASA allergy), atorva 80, coreg 25mg  bid, hep gtt, losartan 50, NG drip.  - progressing symptoms similar to her prior angina, high clinical suspicion for recurrent disease. Would agree best step would be cath, will plan for Monday  Informed Consent   Shared Decision Making/Informed Consent The risks [stroke (1 in 1000), death (1 in 1000), kidney failure [usually temporary] (1 in 500), bleeding (1 in 200), allergic reaction [possibly serious] (1 in 200)], benefits (diagnostic support and management of coronary artery disease) and alternatives of a cardiac catheterization were discussed in detail with [name] and she is willing to proceed.     Dina Rich MD

## 2024-01-10 NOTE — Progress Notes (Addendum)
 PHARMACY - ANTICOAGULATION CONSULT NOTE  Pharmacy Consult for IV heparin Indication: chest pain/ACS  Allergies  Allergen Reactions   Aspirin Anaphylaxis and Swelling    Tongue swells   Tramadol Hives   Hydrocodone Rash   Ibuprofen Itching   Morphine And Codeine Itching   Orange Fruit [Citrus] Rash    Patient Measurements: Height: 5\' 5"  (165.1 cm) Weight: 98.1 kg (216 lb 4.3 oz) IBW/kg (Calculated) : 57 Heparin Dosing Weight: 79.3 kg  Vital Signs: Temp: 98.6 F (37 C) (03/09 1150) Temp Source: Oral (03/09 1150) BP: 132/57 (03/09 1100) Pulse Rate: 82 (03/09 1315)  Labs: Recent Labs    01/09/24 2331 01/10/24 0115 01/10/24 0446 01/10/24 1259  HGB 13.6  --  14.6  --   HCT 39.7  --  43.1  --   PLT 201  --  211  --   HEPARINUNFRC  --   --   --  <0.10*  CREATININE 0.80  --  0.86  --   TROPONINIHS 20* 21* 20* 15    Estimated Creatinine Clearance: 81.6 mL/min (by C-G formula based on SCr of 0.86 mg/dL).   Medical History: Past Medical History:  Diagnosis Date   Anxiety    Coronary artery disease    Diabetes mellitus    Embolism (HCC)    Hypertension    Sarcoidosis     Assessment: [name] is a 58 y.o. year old female admitted on 01/09/2024 with concern for ACS. No anticoagulation prior to admission. Troponin 20 >21 and currently on nitroglycerin drip. Pharmacy consulted to dose heparin.  Heparin level undetectable while on 950u/hr, HgB 14.6 and PLTs 211.   Goal of Therapy:  Heparin level 0.3-0.7 units/ml Monitor platelets by anticoagulation protocol: Yes   Plan:  Increase heparin to 1100u/hr.  8 heparin level  Daily heparin level, CBC, and monitoring for bleeding Plan for cath tmrw.   Thank you for allowing pharmacy to participate in this patient's care.  Estill Batten, PharmD, BCCCP  Emergency Medicine Clinical Pharmacist 01/10/2024,2:21 PM

## 2024-01-10 NOTE — ED Notes (Signed)
 Patient assisted to the bedside commode with minimal assistance.

## 2024-01-10 NOTE — ED Notes (Signed)
 While helping patient to the bathroom she reported to me that she is having intense vaginal itching and stated she believes she has a yeast infection.

## 2024-01-10 NOTE — Progress Notes (Deleted)
 PHARMACY - ANTICOAGULATION CONSULT NOTE  Pharmacy Consult for IV heparin Indication: chest pain/ACS  Allergies  Allergen Reactions   Aspirin Anaphylaxis and Swelling    Tongue swells   Tramadol Hives   Hydrocodone Rash   Ibuprofen Itching   Morphine And Codeine Itching   Orange Fruit [Citrus] Rash    Patient Measurements: Height: 5\' 5"  (165.1 cm) Weight: 98.1 kg (216 lb 4.3 oz) IBW/kg (Calculated) : 57 Heparin Dosing Weight: 79.3 kg  Vital Signs: Temp: 98.6 F (37 C) (03/09 2020) Temp Source: Oral (03/09 2020) BP: 150/82 (03/09 2230) Pulse Rate: 65 (03/09 2230)  Labs: Recent Labs    01/09/24 2331 01/10/24 0115 01/10/24 0446 01/10/24 1259 01/10/24 2250  HGB 13.6  --  14.6  --   --   HCT 39.7  --  43.1  --   --   PLT 201  --  211  --   --   HEPARINUNFRC  --   --   --  <0.10* 0.10*  CREATININE 0.80  --  0.86  --   --   TROPONINIHS 20* 21* 20* 15  --     Estimated Creatinine Clearance: 81.6 mL/min (by C-G formula based on SCr of 0.86 mg/dL).   Medical History: Past Medical History:  Diagnosis Date   Anxiety    CAD S/P percutaneous coronary angioplasty 03/29/2022   Cath-PCI 03/2022:   RPDA-1 lesion is 40% stenosed. RPDA-2 stent is 99% re-stenosed (DES PCI Synergy DX 2.5 x 24 => 0%);   2nd Diag lesion is 80% stenosed => Med Rx;   Previously placed Mid Cx stent  widely patent.;   LV end diastolic pressure is normal.     Intervention: PTCA/DES PCI of PDA ISR: 2.5 x 24 mm Synergy XD stent                                      Diabetes mellitus    Embolism (HCC)    Hypertension    NSTEMI (non-ST elevated myocardial infarction) (HCC) 03/29/2022   Sarcoidosis     Assessment: [name] is a 58 y.o. year old female admitted on 01/09/2024 with concern for ACS. No anticoagulation prior to admission. Troponin 20 >21 and currently on nitroglycerin drip. Pharmacy consulted to dose heparin.  Heparin level 0.1, subtherapeutic  No issues with infusion or s/sx of bleeding  noted.   Goal of Therapy:  Heparin level 0.3-0.7 units/ml Monitor platelets by anticoagulation protocol: Yes   Plan:  Bolus heparin 2000 units and increase heparin to 1200u/hr.  8 heparin level  Daily heparin level, CBC, and monitoring for bleeding Plan for cath on 3/10  Thank you for allowing pharmacy to participate in this patient's care.  Marja Kays, PharmD Emergency Medicine Clinical Pharmacist 01/10/2024,11:25 PM

## 2024-01-10 NOTE — ED Notes (Signed)
 Patient has requested that I do not update anyone over the phone, she states she updates her family.

## 2024-01-10 NOTE — Progress Notes (Signed)
 PHARMACY - ANTICOAGULATION CONSULT NOTE  Pharmacy Consult for IV heparin Indication: chest pain/ACS  Allergies  Allergen Reactions   Aspirin Anaphylaxis and Swelling    Tongue swells   Tramadol Hives   Hydrocodone Rash   Ibuprofen Itching   Morphine And Codeine Itching   Orange Fruit [Citrus] Rash    Patient Measurements: Height: 5\' 5"  (165.1 cm) Weight: 98.1 kg (216 lb 4.3 oz) IBW/kg (Calculated) : 57 Heparin Dosing Weight: 79.3 kg  Vital Signs: Temp: 98.6 F (37 C) (03/09 2020) Temp Source: Oral (03/09 2020) BP: 150/82 (03/09 2230) Pulse Rate: 65 (03/09 2230)  Labs: Recent Labs    01/09/24 2331 01/10/24 0115 01/10/24 0446 01/10/24 1259 01/10/24 2250  HGB 13.6  --  14.6  --   --   HCT 39.7  --  43.1  --   --   PLT 201  --  211  --   --   HEPARINUNFRC  --   --   --  <0.10* 0.10*  CREATININE 0.80  --  0.86  --   --   TROPONINIHS 20* 21* 20* 15  --     Estimated Creatinine Clearance: 81.6 mL/min (by C-G formula based on SCr of 0.86 mg/dL).   Medical History: Past Medical History:  Diagnosis Date   Anxiety    CAD S/P percutaneous coronary angioplasty 03/29/2022   Cath-PCI 03/2022:   RPDA-1 lesion is 40% stenosed. RPDA-2 stent is 99% re-stenosed (DES PCI Synergy DX 2.5 x 24 => 0%);   2nd Diag lesion is 80% stenosed => Med Rx;   Previously placed Mid Cx stent  widely patent.;   LV end diastolic pressure is normal.     Intervention: PTCA/DES PCI of PDA ISR: 2.5 x 24 mm Synergy XD stent                                      Diabetes mellitus    Embolism (HCC)    Hypertension    NSTEMI (non-ST elevated myocardial infarction) (HCC) 03/29/2022   Sarcoidosis     Assessment: [name] is a 58 y.o. year old female admitted on 01/09/2024 with concern for ACS. No anticoagulation prior to admission. Troponin 20 >21 and currently on nitroglycerin drip. Pharmacy consulted to dose heparin.  Heparin level 0.1, subtherapeutic  No issues with infusion or s/sx of bleeding  noted.   Goal of Therapy:  Heparin level 0.3-0.7 units/ml Monitor platelets by anticoagulation protocol: Yes   Plan:  Bolus heparin 2000 units and increase heparin to 1200u/hr.  6 heparin level  Daily heparin level, CBC, and monitoring for bleeding Plan for cath on 3/10  Thank you for allowing pharmacy to participate in this patient's care.  Marja Kays, PharmD Emergency Medicine Clinical Pharmacist 01/10/2024,11:29 PM

## 2024-01-10 NOTE — ED Notes (Signed)
 Please give an update to daughter Marcelle Smiling [phone]

## 2024-01-11 ENCOUNTER — Inpatient Hospital Stay (HOSPITAL_COMMUNITY)

## 2024-01-11 ENCOUNTER — Encounter (HOSPITAL_COMMUNITY)
Admission: EM | Disposition: A | Discharge status: Home Health | Payer: Self-pay | Source: Home / Self Care | Attending: Cardiovascular Disease

## 2024-01-11 DIAGNOSIS — E1165 Type 2 diabetes mellitus with hyperglycemia: Secondary | ICD-10-CM | POA: Diagnosis not present

## 2024-01-11 DIAGNOSIS — I69354 Hemiplegia and hemiparesis following cerebral infarction affecting left non-dominant side: Secondary | ICD-10-CM

## 2024-01-11 DIAGNOSIS — I2510 Atherosclerotic heart disease of native coronary artery without angina pectoris: Secondary | ICD-10-CM | POA: Diagnosis not present

## 2024-01-11 DIAGNOSIS — R2990 Unspecified symptoms and signs involving the nervous system: Secondary | ICD-10-CM

## 2024-01-11 DIAGNOSIS — R29810 Facial weakness: Secondary | ICD-10-CM

## 2024-01-11 DIAGNOSIS — I214 Non-ST elevation (NSTEMI) myocardial infarction: Secondary | ICD-10-CM

## 2024-01-11 DIAGNOSIS — I25110 Atherosclerotic heart disease of native coronary artery with unstable angina pectoris: Secondary | ICD-10-CM | POA: Diagnosis present

## 2024-01-11 DIAGNOSIS — I10 Essential (primary) hypertension: Secondary | ICD-10-CM

## 2024-01-11 DIAGNOSIS — R519 Headache, unspecified: Secondary | ICD-10-CM

## 2024-01-11 DIAGNOSIS — Z9861 Coronary angioplasty status: Secondary | ICD-10-CM

## 2024-01-11 DIAGNOSIS — I249 Acute ischemic heart disease, unspecified: Secondary | ICD-10-CM

## 2024-01-11 DIAGNOSIS — R4701 Aphasia: Secondary | ICD-10-CM

## 2024-01-11 DIAGNOSIS — E1169 Type 2 diabetes mellitus with other specified complication: Secondary | ICD-10-CM

## 2024-01-11 DIAGNOSIS — Z794 Long term (current) use of insulin: Secondary | ICD-10-CM

## 2024-01-11 DIAGNOSIS — R410 Disorientation, unspecified: Secondary | ICD-10-CM

## 2024-01-11 HISTORY — PX: LEFT HEART CATH AND CORONARY ANGIOGRAPHY: CATH118249

## 2024-01-11 LAB — CBC WITH DIFFERENTIAL/PLATELET
Abs Immature Granulocytes: 0.01 10*3/uL (ref 0.00–0.07)
Basophils Absolute: 0.1 10*3/uL (ref 0.0–0.1)
Basophils Relative: 1 %
Eosinophils Absolute: 0.1 10*3/uL (ref 0.0–0.5)
Eosinophils Relative: 2 %
HCT: 40.2 % (ref 36.0–46.0)
Hemoglobin: 13.6 g/dL (ref 12.0–15.0)
Immature Granulocytes: 0 %
Lymphocytes Relative: 40 %
Lymphs Abs: 1.7 10*3/uL (ref 0.7–4.0)
MCH: 33.5 pg (ref 26.0–34.0)
MCHC: 33.8 g/dL (ref 30.0–36.0)
MCV: 99 fL (ref 80.0–100.0)
Monocytes Absolute: 0.3 10*3/uL (ref 0.1–1.0)
Monocytes Relative: 7 %
Neutro Abs: 2.1 10*3/uL (ref 1.7–7.7)
Neutrophils Relative %: 50 %
Platelets: 173 10*3/uL (ref 150–400)
RBC: 4.06 MIL/uL (ref 3.87–5.11)
RDW: 12.8 % (ref 11.5–15.5)
WBC: 4.2 10*3/uL (ref 4.0–10.5)
nRBC: 0 % (ref 0.0–0.2)

## 2024-01-11 LAB — RAPID URINE DRUG SCREEN, HOSP PERFORMED
Amphetamines: NOT DETECTED
Barbiturates: NOT DETECTED
Benzodiazepines: POSITIVE — AB
Cocaine: NOT DETECTED
Opiates: NOT DETECTED
Tetrahydrocannabinol: NOT DETECTED

## 2024-01-11 LAB — BASIC METABOLIC PANEL
Anion gap: 8 (ref 5–15)
BUN: 13 mg/dL (ref 6–20)
CO2: 23 mmol/L (ref 22–32)
Calcium: 8.4 mg/dL — ABNORMAL LOW (ref 8.9–10.3)
Chloride: 105 mmol/L (ref 98–111)
Creatinine, Ser: 0.78 mg/dL (ref 0.44–1.00)
GFR, Estimated: >60 mL/min (ref >60)
Glucose, Bld: 281 mg/dL — ABNORMAL HIGH (ref 70–99)
Potassium: 3.9 mmol/L (ref 3.5–5.1)
Sodium: 136 mmol/L (ref 135–145)

## 2024-01-11 LAB — MAGNESIUM: Magnesium: 1.8 mg/dL (ref 1.7–2.4)

## 2024-01-11 LAB — GLUCOSE, CAPILLARY
Glucose-Capillary: 283 mg/dL — ABNORMAL HIGH (ref 70–99)
Glucose-Capillary: 247 mg/dL — ABNORMAL HIGH (ref 70–99)
Glucose-Capillary: 303 mg/dL — ABNORMAL HIGH (ref 70–99)
Glucose-Capillary: 195 mg/dL — ABNORMAL HIGH (ref 70–99)

## 2024-01-11 LAB — BLOOD GAS, VENOUS
Acid-Base Excess: 3.4 mmol/L — ABNORMAL HIGH (ref 0.0–2.0)
Bicarbonate: 26.8 mmol/L (ref 20.0–28.0)
O2 Saturation: 99.8 %
Patient temperature: 36.6
pCO2, Ven: 35 mmHg — ABNORMAL LOW (ref 44–60)
pH, Ven: 7.49 — ABNORMAL HIGH (ref 7.25–7.43)
pO2, Ven: 101 mmHg — ABNORMAL HIGH (ref 32–45)

## 2024-01-11 LAB — VITAMIN B12: Vitamin B-12: 407 pg/mL (ref 180–914)

## 2024-01-11 LAB — PROTIME-INR
INR: 1 (ref 0.8–1.2)
Prothrombin Time: 13 s (ref 11.4–15.2)

## 2024-01-11 LAB — CBG MONITORING, ED: Glucose-Capillary: 259 mg/dL — ABNORMAL HIGH (ref 70–99)

## 2024-01-11 LAB — HEPARIN LEVEL (UNFRACTIONATED): Heparin Unfractionated: 0.42 [IU]/mL (ref 0.30–0.70)

## 2024-01-11 LAB — TSH: TSH: 0.498 u[IU]/mL (ref 0.350–4.500)

## 2024-01-11 LAB — APTT: aPTT: <22 s — ABNORMAL LOW (ref 24–36)

## 2024-01-11 LAB — AMMONIA: Ammonia: 64 umol/L — ABNORMAL HIGH (ref 9–35)

## 2024-01-11 SURGERY — LEFT HEART CATH AND CORONARY ANGIOGRAPHY
Anesthesia: LOCAL

## 2024-01-11 MED ORDER — SODIUM CHLORIDE 0.9 % WEIGHT BASED INFUSION
3.0000 mL/kg/h | INTRAVENOUS | Status: DC
Start: 2024-01-12 — End: 2024-01-11

## 2024-01-11 MED ORDER — IOHEXOL 350 MG/ML SOLN
75.0000 mL | Freq: Once | INTRAVENOUS | Status: AC | PRN
  Administered 2024-01-11: 75 mL via INTRAVENOUS

## 2024-01-11 MED ORDER — LABETALOL HCL 5 MG/ML IV SOLN: 10.0000 mg | INTRAVENOUS | Status: AC | PRN

## 2024-01-11 MED ORDER — MIDAZOLAM HCL 2 MG/2ML IJ SOLN
INTRAMUSCULAR | Status: DC | PRN
  Administered 2024-01-11 (×2): 1 mg via INTRAVENOUS

## 2024-01-11 MED ORDER — LORAZEPAM 2 MG/ML IJ SOLN
2.0000 mg | Freq: Once | INTRAMUSCULAR | Status: AC | PRN
  Administered 2024-01-11: 2 mg via INTRAVENOUS
  Filled 2024-01-11: qty 1

## 2024-01-11 MED ORDER — HEPARIN (PORCINE) IN NACL 1000-0.9 UT/500ML-% IV SOLN
INTRAVENOUS | Status: DC | PRN
  Administered 2024-01-11 (×3): 500 mL

## 2024-01-11 MED ORDER — FENTANYL CITRATE (PF) 100 MCG/2ML IJ SOLN
INTRAMUSCULAR | Status: DC | PRN
  Administered 2024-01-11 (×3): 25 ug via INTRAVENOUS

## 2024-01-11 MED ORDER — HEPARIN SODIUM (PORCINE) 1000 UNIT/ML IJ SOLN
INTRAMUSCULAR | Status: AC
Start: 2024-01-11 — End: ?
  Filled 2024-01-11: qty 10

## 2024-01-11 MED ORDER — LORAZEPAM 2 MG/ML IJ SOLN
2.0000 mg | Freq: Once | INTRAMUSCULAR | Status: AC
  Administered 2024-01-11: 2 mg via INTRAVENOUS

## 2024-01-11 MED ORDER — HYDRALAZINE HCL 20 MG/ML IJ SOLN
10.0000 mg | INTRAMUSCULAR | Status: AC | PRN
  Administered 2024-01-11: 10 mg via INTRAVENOUS

## 2024-01-11 MED ORDER — FENTANYL CITRATE (PF) 100 MCG/2ML IJ SOLN
INTRAMUSCULAR | Status: AC
  Filled 2024-01-11: qty 2

## 2024-01-11 MED ORDER — SODIUM CHLORIDE 0.9 % WEIGHT BASED INFUSION: 3.0000 mL/kg/h | INTRAVENOUS | Status: DC

## 2024-01-11 MED ORDER — SODIUM CHLORIDE 0.9% FLUSH: 3.0000 mL | INTRAVENOUS | Status: DC | PRN

## 2024-01-11 MED ORDER — NITROGLYCERIN 1 MG/10 ML FOR IR/CATH LAB
INTRA_ARTERIAL | Status: AC
Start: 2024-01-11 — End: 2024-01-11
  Filled 2024-01-11: qty 10

## 2024-01-11 MED ORDER — SODIUM CHLORIDE 0.9 % IV SOLN: 250.0000 mL | INTRAVENOUS | Status: DC | PRN

## 2024-01-11 MED ORDER — LIDOCAINE HCL (PF) 1 % IJ SOLN
INTRAMUSCULAR | Status: AC
  Filled 2024-01-11: qty 30

## 2024-01-11 MED ORDER — SODIUM CHLORIDE 0.9 % WEIGHT BASED INFUSION
1.0000 mL/kg/h | INTRAVENOUS | Status: DC
  Administered 2024-01-11: 1 mL/kg/h via INTRAVENOUS

## 2024-01-11 MED ORDER — IOHEXOL 350 MG/ML SOLN
INTRAVENOUS | Status: DC | PRN
  Administered 2024-01-11: 85 mL

## 2024-01-11 MED ORDER — INSULIN ASPART 100 UNIT/ML IJ SOLN
4.0000 [IU] | Freq: Three times a day (TID) | INTRAMUSCULAR | Status: DC
  Administered 2024-01-11 – 2024-01-12 (×3): 4 [IU] via SUBCUTANEOUS

## 2024-01-11 MED ORDER — HALOPERIDOL LACTATE 5 MG/ML IJ SOLN: 2.0000 mg | Freq: Four times a day (QID) | INTRAMUSCULAR | Status: DC | PRN

## 2024-01-11 MED ORDER — VERAPAMIL HCL 2.5 MG/ML IV SOLN: INTRAVENOUS | Status: DC | PRN

## 2024-01-11 MED ORDER — NITROGLYCERIN 1 MG/10 ML FOR IR/CATH LAB
INTRA_ARTERIAL | Status: DC | PRN
  Administered 2024-01-11: 200 ug via INTRACORONARY

## 2024-01-11 MED ORDER — HEPARIN SODIUM (PORCINE) 1000 UNIT/ML IJ SOLN
INTRAMUSCULAR | Status: DC | PRN
  Administered 2024-01-11: 5000 [IU] via INTRAVENOUS

## 2024-01-11 MED ORDER — ACETAMINOPHEN 325 MG PO TABS
ORAL_TABLET | ORAL | Status: AC
  Filled 2024-01-11: qty 2

## 2024-01-11 MED ORDER — VERAPAMIL HCL 2.5 MG/ML IV SOLN
INTRAVENOUS | Status: AC
  Filled 2024-01-11: qty 2

## 2024-01-11 MED ORDER — FUROSEMIDE 20 MG PO TABS
40.0000 mg | ORAL_TABLET | Freq: Two times a day (BID) | ORAL | Status: DC
  Administered 2024-01-12 – 2024-01-13 (×3): 40 mg via ORAL
  Filled 2024-01-11 (×3): qty 2

## 2024-01-11 MED ORDER — HYDRALAZINE HCL 20 MG/ML IJ SOLN
INTRAMUSCULAR | Status: AC
  Filled 2024-01-11: qty 1

## 2024-01-11 MED ORDER — LIDOCAINE HCL (PF) 1 % IJ SOLN
INTRAMUSCULAR | Status: DC | PRN
  Administered 2024-01-11: 2 mL via INTRADERMAL

## 2024-01-11 MED ORDER — VERAPAMIL HCL 2.5 MG/ML IV SOLN
INTRAVENOUS | Status: DC | PRN
  Administered 2024-01-11: 10 mL via INTRA_ARTERIAL

## 2024-01-11 MED ORDER — MIDAZOLAM HCL 2 MG/2ML IJ SOLN
INTRAMUSCULAR | Status: AC
  Filled 2024-01-11: qty 2

## 2024-01-11 MED ORDER — SODIUM CHLORIDE 0.9 % WEIGHT BASED INFUSION: 1.0000 mL/kg/h | INTRAVENOUS | Status: DC

## 2024-01-11 MED ORDER — SODIUM CHLORIDE 0.9% FLUSH
3.0000 mL | Freq: Two times a day (BID) | INTRAVENOUS | Status: DC
  Administered 2024-01-11 – 2024-01-12 (×2): 3 mL via INTRAVENOUS

## 2024-01-11 MED ORDER — ONDANSETRON HCL 4 MG/2ML IJ SOLN
INTRAMUSCULAR | Status: AC
  Filled 2024-01-11: qty 2

## 2024-01-11 MED ORDER — LORAZEPAM 2 MG/ML IJ SOLN
INTRAMUSCULAR | Status: AC
  Filled 2024-01-11: qty 1

## 2024-01-11 SURGICAL SUPPLY — 11 items
CATH 5FR JL3.5 JR4 ANG PIG MP (CATHETERS) IMPLANT
CATH INFINITI 5 FR MPA2 (CATHETERS) IMPLANT
CATH INFINITI 5FR JL4 (CATHETERS) IMPLANT
CATH LAUNCHER 5F EBU3.5 (CATHETERS) IMPLANT
DEVICE RAD COMP TR BAND LRG (VASCULAR PRODUCTS) IMPLANT
GLIDESHEATH SLEND SS 6F .021 (SHEATH) IMPLANT
GUIDEWIRE INQWIRE 1.5J.035X260 (WIRE) IMPLANT
INQWIRE 1.5J .035X260CM (WIRE) ×1 IMPLANT
KIT SINGLE USE MANIFOLD (KITS) IMPLANT
PACK CARDIAC CATHETERIZATION (CUSTOM PROCEDURE TRAY) ×1 IMPLANT
SET ATX-X65L (MISCELLANEOUS) IMPLANT

## 2024-01-11 NOTE — Progress Notes (Signed)
 TR Band deflated at 1835 but still on per Dr. Herbie Baltimore. Ok to leave in place for now and will re-assess in the morning.

## 2024-01-11 NOTE — Progress Notes (Signed)
 Patient very confused at this time. Patient trying to climb out of bed. She is asking what is going. Patient remains slow to respond to commands. She was able to tell me her name but kept repeating her name when asked her birthday. Ms. Disanti continued to ask for help but could not express the type of help she needed. She just wanted to know what was going on. She c/o a headache. Code stroke called at this time.Mamie Levers

## 2024-01-11 NOTE — Progress Notes (Signed)
 Patient arrived to cath lab holding bay 13. Patient appears really drowsy. She is slow to respond to questions, however, is able to answer appropriately. Neuro assessment performed to establish a baseline. Bilateral upper and lower extremity grips equal. Pupils +3, equal, round and reactive. Right radial TR band intact. No bleeding or hematoma noted. Post activity and precautions explained. Care and report given to Carlyn Reichert, RN.[name]

## 2024-01-11 NOTE — Progress Notes (Signed)
 Freddie Apley (daughter) called. Sts Dahlia Byes (youngest daughter) should have no say in pt decision making.   The only people that should be making decisions are Clint Bolder (oldest daughter) - [phone] and Si Raider (Middle child) - [phone]
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
## 2024-01-11 NOTE — Interval H&P Note (Signed)
 History and Physical Interval Note:  01/11/2024 8:41 AM  [name]  has presented today for surgery, with the diagnosis of unstable angina.  The various methods of treatment have been discussed with the patient and family. After consideration of risks, benefits and other options for treatment, the patient has consented to  Procedure(s): LEFT HEART CATH AND CORONARY ANGIOGRAPHY (N/A) as a surgical intervention.  The patient's history has been reviewed, patient examined, no change in status, stable for surgery.  I have reviewed the patient's chart and labs.  Questions were answered to the patient's satisfaction.    Cath Lab Visit (complete for each Cath Lab visit)  Clinical Evaluation Leading to the Procedure:   ACS: Yes.    Non-ACS: N/A  [name]

## 2024-01-11 NOTE — Progress Notes (Signed)
 Echo attempted in cath lab holding, patient is uncooperative at this time. Will retry on 3/11  [facility] Ruhi Kopke RDCS

## 2024-01-11 NOTE — Progress Notes (Signed)
 Spoke with cath lab RN, states pt has 16g in L arm for IV access. No further needs from IV team this time.

## 2024-01-11 NOTE — Progress Notes (Signed)
    Brief Event Note -- CAlled post Cath 2/2 AMS with reduced responsiveness & increased impulsivity --> unfortunately, I did not spend much time seeing her this AM prior to cath, however she was verbally responsive. ?? If sedation related,but with prio CVA history, code Stroke called -- > Initial CT-CTA negative, & Neuro is planning to try MRI.    I am seeing her now post CT scan -- she seems more sedated, & will respond to her name, but not answer ?s / follow commands.    Was able to void & less impulsive. Concerns re patient safety with increased impulsivity & TR band in place.  --> has Ativan ordered for MRI, but not certain of best Rx for her current Sx as I am leery of exacerbating her symptoms with sedatives.  Am consulting [facility] for assistance with management.  Determining appropriate patient placement.  Will see about 1:1 sitter.  Bryan Lemma, MD

## 2024-01-11 NOTE — Progress Notes (Signed)
 Patient Name: [name] Date of Encounter: 01/11/2024 [facility] Cardiologist: [place] Ishikawa, MD   Patient Summary  .    58 y/o woman with [place] CAD-PCI in setting of NSTEMI, HTN, DM-2 (on insuling) & HLD (poorly controlled) & prior CVA who presented early Am 3/9 with elevated BPs (SBPs in 180-190s) & Chest Pain concerning for ACS.    Interval Summary  .    Still having some Chest Pain - not as bad Mild dyspnea  Assessment & Plan .   Principal Problem:   Acute coronary syndrome (HCC) Active Problems:   H/o NSTEMI (non-ST elevated myocardial infarction) (HCC)   CAD S/P percutaneous coronary angioplasty   Coronary artery disease involving native coronary artery of native heart with unstable angina pectoris (HCC)   Essential hypertension, benign   Uncontrolled type 2 diabetes mellitus with hyperglycemia, with long-term current use of insulin (HCC)   Hyperlipidemia associated with type 2 diabetes mellitus (HCC)   Hemiparesis affecting left side as late effect of cerebrovascular accident [facility])   Principal Problem:   Acute coronary syndrome (HCC) / CAD S/P percutaneous coronary angioplasty /   Coronary artery disease involving native coronary artery of native heart with unstable angina pectoris (HCC)    Essential hypertension, benign /Hyperlipidemia associated with type 2 diabetes mellitus (HCC)    Uncontrolled type 2 diabetes mellitus with hyperglycemia, with long-term current use of insulin (HCC) Presented with CP concerning for Angina with trivial Troponin levels -> Plan from the weekend is for Cardiac Cath today On Plavix monotherapy 2/2 known ASA Allergy ON NTG gtt => was on Imdur @ home  Echo Pending On Carvedilol 25 mg BID & Losartan 50 mg daily => Concern for Hypertensive Urgency on admission.Pressures have been somewhat labile on NTG gtt --> likely increase to 100 mg Losartan on d/c DM-2: SSI with home Glargine; glipizide on hold HLD: ? Not taking  atorvastatin? Too big to swallow? => consider converting to Rosuvastatin 40 mg & low threshold for referral to CVRR for PCSK9-I  Chronic Conditions #Chronic pain: continue home gabapentin 600mg  at bedtime #Anxiety/depression: continue home duloxetine 60mg  at qd #GERD: continue home famotidine 20mg  bid   Vital Signs .    Vitals:   01/11/24 0515 01/11/24 0530 01/11/24 0752 01/11/24 0800  BP: (!) 159/86  (!) 154/102 (!) 162/94  Pulse: 79 70 69 82  Resp: 16 18 20  (!) 28  Temp:   98.2 F (36.8 C) 98.1 F (36.7 C)  TempSrc:      SpO2:  96% 98% 98%  Weight:      Height:        Intake/Output Summary (Last 24 hours) at 01/11/2024 9528 Last data filed at 01/11/2024 0000 Gross per 24 hour  Intake --  Output 800 ml  Net -800 ml      01/09/2024   10:37 PM 09/16/2023    3:56 PM 07/03/2023    9:12 AM  Last 3 Weights  Weight (lbs) 216 lb 4.3 oz 216 lb 3.2 oz 209 lb 3.2 oz  Weight (kg) 98.1 kg 98.068 kg 94.892 kg      Lab Results  Component Value Date   NA 136 01/11/2024   CL 105 01/11/2024   K 3.9 01/11/2024   CO2 23 01/11/2024   BUN 13 01/11/2024   CREATININE 0.78 01/11/2024   GFRNONAA >60 01/11/2024   CALCIUM 8.4 (L) 01/11/2024   PHOS 3.6 03/30/2022   ALBUMIN 3.2 (L) 11/12/2022   GLUCOSE  281 (H) 01/11/2024   Lab Results  Component Value Date   HGBA1C 12.3 (H) 01/10/2024   Lab Results  Component Value Date   CHOL 249 (H) 01/10/2024   HDL 42 01/10/2024   LDLCALC 135 (H) 01/10/2024   TRIG 361 (H) 01/10/2024   CHOLHDL 5.9 01/10/2024   Telemetry/ECG    NSR - Personally Reviewed  No New EKG  Physical Exam .   GEN: No acute distress.  Obese Neck: No JVD or bruit Cardiac: RRR, distant S1-S2. no murmurs, rubs, or gallops.  Respiratory: Clear to auscultation bilaterally. GI: Soft, nontender, non-distended  MS: No edema   For questions or updates, please contact [facility] Please consult [email or web address] for contact info under        Signed, Bryan Lemma, MD

## 2024-01-11 NOTE — Progress Notes (Signed)
  Show:Clear all [x] Written[x] Templated[] Copied  Added by: [x] Mamie Levers, RN  [] Hover for details Stroke team to bedside. Patient transported to CT for head CT along with RN and rapid response nurse.Mamie Levers
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
## 2024-01-11 NOTE — Progress Notes (Signed)
 Pt not cooperating and letting phlebotomy draw her blood. Unable to obtain specimens at this moment.
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
## 2024-01-11 NOTE — Inpatient Diabetes Management (Signed)
 Inpatient Diabetes Program Recommendations  AACE/ADA: New Consensus Statement on Inpatient Glycemic Control  Target Ranges:  Prepandial:   less than 140 mg/dL      Peak postprandial:   less than 180 mg/dL (1-2 hours)      Critically ill patients:  140 - 180 mg/dL    Latest Reference Range & Units 01/10/24 07:41 01/10/24 11:47 01/10/24 17:38 01/10/24 22:28 01/11/24 07:45  Glucose-Capillary 70 - 99 mg/dL 098 (H) 119 (H) 147 (H) 342 (H) 259 (H)    Latest Reference Range & Units 01/10/24 04:46  Hemoglobin A1C 4.8 - 5.6 % 12.3 (H)   Review of Glycemic Control  Diabetes history: DM2 Outpatient Diabetes medications: Lantus 25 units BID, Glipizide 10 mg BID Current orders for Inpatient glycemic control: Lantus 25 units BID, Novolog 0-15 units TID with meals, Novolog 0-5 units QHS  Inpatient Diabetes Program Recommendations:    Insulin: Please consider increasing Lantus to 30 units BID.  HbgA1C: A1C 12.3% on 01/10/24 indicating an average glucose of 306 mg/dl over the past 2-3 months.  Thanks, Orlando Penner, RN, MSN, CDCES Diabetes Coordinator Inpatient Diabetes Program [phone] (Team Pager from 8am to 5pm)

## 2024-01-11 NOTE — Code Documentation (Signed)
 Stroke Response Nurse Documentation Code Documentation  [name] is a 58 y.o. female admitted to [facility]  on 01/09/2024 for chest pain with past medical hx of CAD, CVA, HTN, HLD, diabetes. On clopidogrel 75 mg daily. Code stroke was activated by cath lab holding RN.   Patient in cath lab holding where she was LKW at 0900 and now complaining of altered mental status. In procedure pt complained of pain behind her left eye. While in recovery, staff noticed she progressively became more confused.   Stroke team at the bedside after patient activation. Patient to CT with team. NIHSS 9, see documentation for details and code stroke times. Patient with decreased LOC, disoriented, not following commands, left facial droop, right leg weakness, and Global aphasia  on exam.   The following imaging was completed:  CT Head and CTA.   Patient is not a candidate for IV Thrombolytic due to family declined. Patient is not a candidate for IR due to imaging negative for LVO.   Care/Plan: q2h NIHSS and VS.   Bedside handoff with RN Suzette Battiest.    [name]  Rapid Response RN

## 2024-01-11 NOTE — Progress Notes (Signed)
 [facility] Interventional Cardiology Note  Patient evaluated at approximately 12:30 PM after I was alerted that the patient had become more confused and was not following commands.  On my exam, she is alert and oriented only to self.  She intermittently answers simple questions but does not follow commands.  This is a significant change since completion of the cath, at which time the patient was A&O x 3.  Her only complaint after the cath was of a headache (primarily behind the left eye) following administration of IC nitroglycerin.  She was taken for STAT CT/CTA head.  Upon my reevaluation after CT, she was sleeping, having received lorazepam due to agitation in CT.  Neurology team continues to follow.  Brain MRI to be performed when the patient's mental status allows; CT/CTA did not show any acute abnormalities.  Coronary revascularization will need to be readdressed after completion of stroke workup and improvement in the patient's neurologic status.  Yvonne Kendall, MD [facility] 01/11/24 7:24 PM

## 2024-01-11 NOTE — Progress Notes (Signed)
 Report given to 4E RN. Patient transported to 4E 25. Care released.[name]

## 2024-01-11 NOTE — Progress Notes (Signed)
 PHARMACY - ANTICOAGULATION CONSULT NOTE  Pharmacy Consult for IV heparin Indication: chest pain/ACS  Allergies  Allergen Reactions   Aspirin Anaphylaxis and Swelling    Tongue swells   Tramadol Hives   Hydrocodone Rash   Ibuprofen Itching   Morphine And Codeine Itching   Orange Fruit [Citrus] Rash    Patient Measurements: Height: 5\' 5"  (165.1 cm) Weight: 98.1 kg (216 lb 4.3 oz) IBW/kg (Calculated) : 57 Heparin Dosing Weight: 79.3 kg  Vital Signs: Temp: 98.3 F (36.8 C) (03/10 0350) Temp Source: Oral (03/10 0350) BP: 159/86 (03/10 0515) Pulse Rate: 70 (03/10 0530)  Labs: Recent Labs    01/09/24 2331 01/10/24 0115 01/10/24 0446 01/10/24 1259 01/10/24 2250 01/11/24 0530  HGB 13.6  --  14.6  --   --  13.6  HCT 39.7  --  43.1  --   --  40.2  PLT 201  --  211  --   --  173  HEPARINUNFRC  --   --   --  <0.10* 0.10* 0.42  CREATININE 0.80  --  0.86  --   --   --   TROPONINIHS 20* 21* 20* 15  --   --     Estimated Creatinine Clearance: 81.6 mL/min (by C-G formula based on SCr of 0.86 mg/dL).   Medical History: Past Medical History:  Diagnosis Date   Anxiety    CAD S/P percutaneous coronary angioplasty 03/29/2022   Cath-PCI 03/2022:   RPDA-1 lesion is 40% stenosed. RPDA-2 stent is 99% re-stenosed (DES PCI Synergy DX 2.5 x 24 => 0%);   2nd Diag lesion is 80% stenosed => Med Rx;   Previously placed Mid Cx stent  widely patent.;   LV end diastolic pressure is normal.     Intervention: PTCA/DES PCI of PDA ISR: 2.5 x 24 mm Synergy XD stent                                      Diabetes mellitus    Embolism (HCC)    Hypertension    NSTEMI (non-ST elevated myocardial infarction) (HCC) 03/29/2022   Sarcoidosis     Assessment: [name] is a 58 y.o. year old female admitted on 01/09/2024 with concern for ACS. No anticoagulation prior to admission. Troponin 20 >21 and currently on nitroglycerin drip. Pharmacy consulted to dose heparin.  Heparin level 0.42, therapeutic No  issues with infusion or bleeding noted.   Goal of Therapy:  Heparin level 0.3-0.7 units/ml Monitor platelets by anticoagulation protocol: Yes   Plan:  Continue heparin to 1200u/hr.  Daily heparin level, CBC, and monitoring for bleeding Plan for cath on 3/10  Thank you for allowing pharmacy to participate in this patient's care.  Marja Kays, PharmD Emergency Medicine Clinical Pharmacist 01/11/2024,6:07 AM

## 2024-01-12 ENCOUNTER — Inpatient Hospital Stay (HOSPITAL_COMMUNITY)

## 2024-01-12 ENCOUNTER — Encounter (HOSPITAL_COMMUNITY): Payer: Self-pay | Admitting: Internal Medicine

## 2024-01-12 DIAGNOSIS — R29702 NIHSS score 2: Secondary | ICD-10-CM | POA: Diagnosis not present

## 2024-01-12 DIAGNOSIS — I25110 Atherosclerotic heart disease of native coronary artery with unstable angina pectoris: Secondary | ICD-10-CM

## 2024-01-12 DIAGNOSIS — I10 Essential (primary) hypertension: Secondary | ICD-10-CM | POA: Diagnosis not present

## 2024-01-12 DIAGNOSIS — E119 Type 2 diabetes mellitus without complications: Secondary | ICD-10-CM

## 2024-01-12 DIAGNOSIS — I110 Hypertensive heart disease with heart failure: Secondary | ICD-10-CM

## 2024-01-12 DIAGNOSIS — E785 Hyperlipidemia, unspecified: Secondary | ICD-10-CM

## 2024-01-12 DIAGNOSIS — I249 Acute ischemic heart disease, unspecified: Secondary | ICD-10-CM | POA: Diagnosis not present

## 2024-01-12 DIAGNOSIS — I214 Non-ST elevation (NSTEMI) myocardial infarction: Secondary | ICD-10-CM

## 2024-01-12 DIAGNOSIS — I6389 Other cerebral infarction: Secondary | ICD-10-CM

## 2024-01-12 LAB — BASIC METABOLIC PANEL
Anion gap: 6 (ref 5–15)
BUN: 7 mg/dL (ref 6–20)
CO2: 27 mmol/L (ref 22–32)
Calcium: 8.6 mg/dL — ABNORMAL LOW (ref 8.9–10.3)
Chloride: 104 mmol/L (ref 98–111)
Creatinine, Ser: 0.83 mg/dL (ref 0.44–1.00)
GFR, Estimated: >60 mL/min (ref >60)
Glucose, Bld: 186 mg/dL — ABNORMAL HIGH (ref 70–99)
Potassium: 3.8 mmol/L (ref 3.5–5.1)
Sodium: 137 mmol/L (ref 135–145)

## 2024-01-12 LAB — ECHOCARDIOGRAM COMPLETE
AR max vel: 2.39 cm2
AV Area VTI: 2.23 cm2
AV Area mean vel: 2.48 cm2
AV Mean grad: 7 mmHg
AV Peak grad: 13.5 mmHg
Ao pk vel: 1.84 m/s
Area-P 1/2: 3.77 cm2
Height: 65 in
MV VTI: 2.4 cm2
S' Lateral: 2.9 cm
Single Plane A4C EF: 80.5 %
Weight: 3340.41 [oz_av]

## 2024-01-12 LAB — CBC WITH DIFFERENTIAL/PLATELET
Abs Immature Granulocytes: 0.01 10*3/uL (ref 0.00–0.07)
Basophils Absolute: 0.1 10*3/uL (ref 0.0–0.1)
Basophils Relative: 1 %
Eosinophils Absolute: 0 10*3/uL (ref 0.0–0.5)
Eosinophils Relative: 0 %
HCT: 39.2 % (ref 36.0–46.0)
Hemoglobin: 13.5 g/dL (ref 12.0–15.0)
Immature Granulocytes: 0 %
Lymphocytes Relative: 23 %
Lymphs Abs: 1.8 10*3/uL (ref 0.7–4.0)
MCH: 33.7 pg (ref 26.0–34.0)
MCHC: 34.4 g/dL (ref 30.0–36.0)
MCV: 97.8 fL (ref 80.0–100.0)
Monocytes Absolute: 0.6 10*3/uL (ref 0.1–1.0)
Monocytes Relative: 7 %
Neutro Abs: 5.5 10*3/uL (ref 1.7–7.7)
Neutrophils Relative %: 69 %
Platelets: 209 10*3/uL (ref 150–400)
RBC: 4.01 MIL/uL (ref 3.87–5.11)
RDW: 12.8 % (ref 11.5–15.5)
WBC: 8 10*3/uL (ref 4.0–10.5)
nRBC: 0 % (ref 0.0–0.2)

## 2024-01-12 LAB — GLUCOSE, CAPILLARY
Glucose-Capillary: 201 mg/dL — ABNORMAL HIGH (ref 70–99)
Glucose-Capillary: 182 mg/dL — ABNORMAL HIGH (ref 70–99)
Glucose-Capillary: 297 mg/dL — ABNORMAL HIGH (ref 70–99)
Glucose-Capillary: 181 mg/dL — ABNORMAL HIGH (ref 70–99)
Glucose-Capillary: 167 mg/dL — ABNORMAL HIGH (ref 70–99)

## 2024-01-12 LAB — MAGNESIUM: Magnesium: 1.8 mg/dL (ref 1.7–2.4)

## 2024-01-12 LAB — LIPOPROTEIN A (LPA): Lipoprotein (a): 23.6 nmol/L (ref <75.0)

## 2024-01-12 LAB — RPR: RPR Ser Ql: NONREACTIVE

## 2024-01-12 MED ORDER — LACTULOSE 10 GM/15ML PO SOLN: 20.0000 g | Freq: Two times a day (BID) | ORAL | Status: DC | PRN

## 2024-01-12 MED ORDER — CLOPIDOGREL BISULFATE 75 MG PO TABS
75.0000 mg | ORAL_TABLET | Freq: Every day | ORAL | Status: DC
  Administered 2024-01-12 – 2024-01-13 (×2): 75 mg via ORAL
  Filled 2024-01-12 (×2): qty 1

## 2024-01-12 MED ORDER — EZETIMIBE 10 MG PO TABS
10.0000 mg | ORAL_TABLET | Freq: Every day | ORAL | Status: DC
Start: 2024-01-12 — End: 2024-01-13
  Administered 2024-01-12 – 2024-01-13 (×2): 10 mg via ORAL
  Filled 2024-01-12 (×2): qty 1

## 2024-01-12 MED ORDER — ISOSORBIDE MONONITRATE ER 60 MG PO TB24
60.0000 mg | ORAL_TABLET | Freq: Every day | ORAL | Status: DC
  Administered 2024-01-12 – 2024-01-13 (×2): 60 mg via ORAL
  Filled 2024-01-12 (×2): qty 1

## 2024-01-12 MED ORDER — LACTULOSE 10 GM/15ML PO SOLN
20.0000 g | Freq: Once | ORAL | Status: DC
  Filled 2024-01-12: qty 30

## 2024-01-12 MED ORDER — PERFLUTREN LIPID MICROSPHERE
1.0000 mL | INTRAVENOUS | Status: AC | PRN
  Administered 2024-01-12: 2 mL via INTRAVENOUS

## 2024-01-12 MED ORDER — ROSUVASTATIN CALCIUM 20 MG PO TABS
40.0000 mg | ORAL_TABLET | Freq: Every day | ORAL | Status: DC
  Administered 2024-01-13: 40 mg via ORAL
  Filled 2024-01-12: qty 2

## 2024-01-12 NOTE — Evaluation (Signed)
 Physical Therapy Evaluation Patient Details Name: [name] MRN: [id number] DOB: [birth year] Today's Date: 01/12/2024  History of Present Illness  58 y.o. female who is being seen 01/10/2024 for the evaluation of NSTEMI/chest pain. +HTN urgency; 3/10 cardiac cath via R radial artery; cardiothoracic surgery consult and pt deemed not a surgical candidate; post-cath code Stroke due to AMS, MRI brain acute to subacute infarcts seen in the right cerebellum, L occipital and R parietal cortex  PMH- CAD s/p PCI c/b CVA, hx DVT, T2DM, HTN, HLD, Sarcoidosis, chronic pain  Clinical Impression   Pt admitted secondary to problem above with deficits below. Patient's prior functional status difficult to understand due to pt lethargic and giving varying answers. States she uses wheelchair more than rollator to get around inside.  Pt currently requires CGA for step-pivot transfers and was too lethargic to safely ambulate. Anticipate pt is close to her baseline and will be able to walk with rollator when more alert. Apparently has good family support (although her answers to these questions also varied) and home with HHPT should be feasible. If she does not significantly improve when more alert, may need to consider other venues of care on discharge.  Anticipate patient will benefit from PT to address problems listed below.Will continue to follow acutely to maximize functional mobility independence and safety.           If plan is discharge home, recommend the following: A [place] help with walking and/or transfers;A Go[place]en help with bathing/dressing/bathroom;Assistance with cooking/housework;Direct supervision/assist for medications management;Direct supervision/assist for financial management;Assist for transportation;Help with stairs or ramp for entrance   Can travel by private vehicle        Equipment Recommendations None recommended by PT  Recommendations for Other Services       Functional Status  Assessment Patient has had a recent decline in their functional status and demonstrates the ability to make significant improvements in function in a reasonable and predictable amount of time.     Precautions / Restrictions Precautions Precautions: Fall;Other (comment) Recall of Precautions/Restrictions: Impaired Precaution/Restrictions Comments: R radial cath 3/10      Mobility  Bed Mobility Overal bed mobility: Needs Assistance Bed Mobility: Supine to Sit, Sit to Supine     Supine to sit: Contact guard Sit to supine: Modified independent (Device/Increase time)   General bed mobility comments: groggy and falling asleep as initiating OOB and required frequent multi-modal cues but no physical assist    Transfers Overall transfer level: Needs assistance Equipment used: None (stood prior to OT prepared with RW) Transfers: Sit to/from Stand, Bed to chair/wheelchair/BSC Sit to Stand: Contact guard assist   Step pivot transfers: Contact guard assist       General transfer comment: to/from BSC and lateral stepping along EOB    Ambulation/Gait               General Gait Details: pt groggy and appeared unsafe to attempt  Stairs            Wheelchair Mobility     Tilt Bed    Modified Rankin (Stroke Patients Only) Modified Rankin (Stroke Patients Only) Pre-Morbid Rankin Score: Moderate disability Modified Rankin: Moderately severe disability     Balance Overall balance assessment: Mild deficits observed, not formally tested  Pertinent Vitals/Pain Pain Assessment Pain Assessment: No/denies pain    Home Living Family/patient expects to be discharged to:: Private residence Living Arrangements: Other relatives;Children Available Help at Discharge: Family;Available PRN/intermittently Type of Home: House Home Access: Level entry     Alternate Level Stairs-Number of Steps: 14 Home Layout: Two  level;Bed/bath upstairs Home Equipment: Rollator (4 wheels);Wheelchair - manual;BSC/3in1      Prior Function Prior Level of Function : Needs assist             Mobility Comments: modified independent with rollator inside (and later states outside?); states primarily uses wheelchair to get around inside (?) ADLs Comments: uses BSC when downstairs; sits to bathe (not in shower); bathes and dresses herself; family helps with IADLs     Extremity/Trunk Assessment   Upper Extremity Assessment Upper Extremity Assessment: Defer to OT evaluation    Lower Extremity Assessment Lower Extremity Assessment: LLE deficits/detail;Difficult to assess due to impaired cognition LLE Deficits / Details: ?difficulty following instructions for MMT as pt demonstrated 1+ knee extension, however able to stand-pivot and side-step along EOB without buckling    Cervical / Trunk Assessment Cervical / Trunk Assessment: Other exceptions Cervical / Trunk Exceptions: overweight  Communication   Communication Communication: No apparent difficulties    Cognition Arousal: Lethargic Behavior During Therapy: Flat affect   PT - Cognitive impairments: No family/caregiver present to determine baseline, Memory                       PT - Cognition Comments: inconsistent answers re: prior functional status Following commands: Impaired Following commands impaired: Follows one step commands inconsistently, Follows one step commands with increased time     Cueing Cueing Techniques: Verbal cues, Gestural cues, Tactile cues     General Comments General comments (skin integrity, edema, etc.): on 1L at 100% on arrival; placed on RA with sats >94% throughout session    Exercises     Assessment/Plan    PT Assessment Patient needs continued PT services  PT Problem List Decreased strength;Decreased activity tolerance;Decreased balance;Decreased mobility;Decreased knowledge of use of DME;Decreased safety  awareness;Obesity       PT Treatment Interventions DME instruction;Gait training;Stair training;Functional mobility training;Therapeutic activities;Therapeutic exercise;Balance training;Neuromuscular re-education;Cognitive remediation;Patient/family education    PT Goals (Current goals can be found in the Care Plan section)  Acute Rehab PT Goals Patient Stated Goal: wants to go home PT Goal Formulation: Patient unable to participate in goal setting Time For Goal Achievement: 01/26/24 Potential to Achieve Goals: Good    Frequency Min 3X/week     Co-evaluation PT/OT/SLP Co-Evaluation/Treatment: Yes Reason for Co-Treatment: Necessary to address cognition/behavior during functional activity;For patient/therapist safety;To address functional/ADL transfers PT goals addressed during session: Mobility/safety with mobility;Balance;Proper use of DME         AM-PAC PT "6 Clicks" Mobility  Outcome Measure Help needed turning from your back to your side while in a flat bed without using bedrails?: None Help needed moving from lying on your back to sitting on the side of a flat bed without using bedrails?: A Fant Help needed moving to and from a bed to a chair (including a wheelchair)?: A Penrose Help needed standing up from a chair using your arms (e.g., wheelchair or bedside chair)?: A Glore Help needed to walk in hospital room?: A Lot Help needed climbing 3-5 steps with a railing? : Total 6 Click Score: 16    End of Session   Activity Tolerance: Patient limited by lethargy Patient  left: in bed;with call bell/phone within reach;with bed alarm set Nurse Communication: Mobility status PT Visit Diagnosis: Other abnormalities of gait and mobility (R26.89);Hemiplegia and hemiparesis Hemiplegia - Right/Left: Left Hemiplegia - dominant/non-dominant: Non-dominant Hemiplegia - caused by: Cerebral infarction    Time: 1412-1440 PT Time Calculation (min) (ACUTE ONLY): 28 min   Charges:    PT Evaluation $PT Eval Moderate Complexity: 1 Mod   PT General Charges $$ ACUTE PT VISIT: 1 Visit          Jerolyn Center, PT Acute Rehabilitation Services  Office [phone]   Zena Amos 01/12/2024, 3:01 PM

## 2024-01-12 NOTE — Evaluation (Signed)
 Occupational Therapy Evaluation Patient Details Name: [name] MRN: [id number] DOB: [birth year] Today's Date: 01/12/2024   History of Present Illness   58 y.o. female who is being seen 01/10/2024 for the evaluation of NSTEMI/chest pain. +HTN urgency; 3/10 cardiac cath via R radial artery; cardiothoracic surgery consult and pt deemed not a surgical candidate; post-cath code Stroke due to AMS, MRI brain acute to subacute infarcts seen in the right cerebellum, L occipital and R parietal cortex  PMH- CAD s/p PCI c/b CVA, hx DVT, T2DM, HTN, HLD, Sarcoidosis, chronic pain     Clinical Impressions Pt presents with decline in function and safety with ADLs and ADL mobility with impaired balance, endurance and strength (hx of L side weakness form previous CVA). Pt's PLOF  difficult to understand due to pt lethargic and giving varying answers. Pt reports that she was Ind with ADLs, bathes seated on commode, dresses, toilets and feeds herself, is able to make a light snack, uses wheelchair more than rollator to get around inside.  Pt reports that her daughter that lives with her cooks and does housekeeping. Pt currently requires min A with LB ADLs, CGA  with toileting and CGA with mobility/transfers.  Pt seems to have good family support. OT will follow acutely to maximize level of function and safety    If plan is discharge home, recommend the following:   A Folta help with bathing/dressing/bathroom;A Leichter help with walking and/or transfers;Assistance with cooking/housework;Help with stairs or ramp for entrance     Functional Status Assessment   Patient has had a recent decline in their functional status and demonstrates the ability to make significant improvements in function in a reasonable and predictable amount of time.     Equipment Recommendations   None recommended by OT     Recommendations for Other Services         Precautions/Restrictions   Precautions Precautions:  Fall;Other (comment) Recall of Precautions/Restrictions: Impaired Precaution/Restrictions Comments: R radial cath 3/10 Restrictions Weight Bearing Restrictions Per Provider Order: No     Mobility Bed Mobility Overal bed mobility: Needs Assistance Bed Mobility: Supine to Sit, Sit to Supine     Supine to sit: Contact guard Sit to supine: Modified independent (Device/Increase time)   General bed mobility comments: groggy and falling asleep as initiating OOB and required frequent multi-modal cues but no physical assist    Transfers Overall transfer level: Needs assistance Equipment used: None Transfers: Sit to/from Stand, Bed to chair/wheelchair/BSC Sit to Stand: Contact guard assist     Step pivot transfers: Contact guard assist     General transfer comment: to/from [facility] and lateral stepping along EOB      Balance Overall balance assessment: Mild deficits observed, not formally tested                                         ADL either performed or assessed with clinical judgement   ADL Overall ADL's : Needs assistance/impaired Eating/Feeding: Independent;Sitting   Grooming: Wash/dry hands;Wash/dry face;Contact guard assist;Standing   Upper Body Bathing: Supervision/ safety;Sitting   Lower Body Bathing: Minimal assistance   Upper Body Dressing : Supervision/safety;Sitting   Lower Body Dressing: Minimal assistance   Toilet Transfer: Contact guard assist;Stand-pivot;Rolling walker (2 wheels);BSC/3in1;Cueing for safety;Cueing for sequencing   Toileting- Clothing Manipulation and Hygiene: Contact guard assist;Sit to/from stand       Functional mobility during ADLs: Contact  guard assist;Rolling walker (2 wheels);Cueing for safety;Cueing for sequencing       Vision Ability to See in Adequate Light: 0 Adequate (pt reports being nearsighted) Patient Visual Report: No change from baseline       Perception         Praxis         Pertinent  Vitals/Pain Pain Assessment Pain Assessment: No/denies pain     Extremity/Trunk Assessment Upper Extremity Assessment Upper Extremity Assessment: Generalized weakness;LUE deficits/detail LUE Deficits / Details: hx of CVA, L side weakness   Lower Extremity Assessment Lower Extremity Assessment: Defer to PT evaluation LLE Deficits / Details: ?difficulty following instructions for MMT as pt demonstrated 1+ knee extension, however able to stand-pivot and side-step along EOB without buckling   Cervical / Trunk Assessment Cervical / Trunk Assessment: Other exceptions Cervical / Trunk Exceptions: overweight   Communication Communication Communication: No apparent difficulties   Cognition Arousal: Lethargic Behavior During Therapy: Flat affect Cognition: No family/caregiver present to determine baseline                               Following commands: Impaired Following commands impaired: Follows one step commands inconsistently, Follows one step commands with increased time     Cueing  General Comments   Cueing Techniques: Verbal cues;Gestural cues;Tactile cues  on 1L at 100% on arrival; placed on RA with sats >94% throughout session   Exercises     Shoulder Instructions      Home Living Family/patient expects to be discharged to:: Private residence Living Arrangements: Other relatives;Children Available Help at Discharge: Family;Available PRN/intermittently Type of Home: House Home Access: Level entry     Home Layout: Two level;Bed/bath upstairs Alternate Level Stairs-Number of Steps: 14 Alternate Level Stairs-Rails: Right;Left Bathroom Shower/Tub: Chief Strategy Officer: Standard     Home Equipment: Rollator (4 wheels);Wheelchair - manual;BSC/3in1          Prior Functioning/Environment Prior Level of Function : Needs assist             Mobility Comments: modified independent with rollator inside (and later states outside?);  states primarily uses wheelchair to get around inside (?) ADLs Comments: uses BSC when downstairs; sits to bathe (not in shower); bathes and dresses herself; family helps with IADLs    OT Problem List: Decreased strength;Impaired balance (sitting and/or standing);Decreased safety awareness;Decreased activity tolerance;Decreased coordination;Decreased knowledge of use of DME or AE   OT Treatment/Interventions: Self-care/ADL training;DME and/or AE instruction;Therapeutic activities;Balance training;Therapeutic exercise;Patient/family education      OT Goals(Current goals can be found in the care plan section)   Acute Rehab OT Goals Patient Stated Goal: go home OT Goal Formulation: With patient Time For Goal Achievement: 01/26/24 Potential to Achieve Goals: Good ADL Goals Pt Will Perform Grooming: with supervision;standing Pt Will Perform Upper Body Bathing: with set-up;with modified independence Pt Will Perform Lower Body Bathing: with contact guard assist;with supervision;sit to/from stand Pt Will Perform Upper Body Dressing: with set-up;with modified independence Pt Will Perform Lower Body Dressing: with contact guard assist;with supervision;sit to/from stand Pt Will Transfer to Toilet: with supervision;with modified independence;ambulating Pt Will Perform Toileting - Clothing Manipulation and hygiene: with supervision;with modified independence;sit to/from stand   OT Frequency:  Min 2X/week    Co-evaluation PT/OT/SLP Co-Evaluation/Treatment: Yes Reason for Co-Treatment: Necessary to address cognition/behavior during functional activity;For patient/therapist safety;To address functional/ADL transfers PT goals addressed during session: Mobility/safety with mobility;Balance;Proper use of DME  OT goals addressed during session: ADL's and self-care;Proper use of Adaptive equipment and DME      AM-PAC OT "6 Clicks" Daily Activity     Outcome Measure Help from another person eating  meals?: None Help from another person taking care of personal grooming?: A [place] Help from another person toileting, which includes using toliet, bedpan, or urinal?: A Nielson Help from another person bathing (including washing, rinsing, drying)?: A Fritcher Help from another person to put on and taking off regular upper body clothing?: A Lira Help from another person to put on and taking off regular lower body clothing?: A Capers 6 Click Score: 19   End of Session Equipment Utilized During Treatment: Rolling walker (2 wheels);Other (comment) [facility]) Nurse Communication: Mobility status  Activity Tolerance: Patient tolerated treatment well Patient left: in bed;with bed alarm set  OT Visit Diagnosis: Unsteadiness on feet (R26.81);Other abnormalities of gait and mobility (R26.89);Muscle weakness (generalized) (M62.81)                Time: 7829-5621 OT Time Calculation (min): 28 min Charges:  OT General Charges $OT Visit: 1 Visit OT Evaluation $OT Eval Moderate Complexity: 1 Mod    Galen Manila 01/12/2024, 4:00 PM

## 2024-01-12 NOTE — Progress Notes (Signed)
 Unable to complete admission documentation dt patient AMS

## 2024-01-12 NOTE — Plan of Care (Signed)
  Problem: Metabolic: Goal: Ability to maintain appropriate glucose levels will improve Outcome: Not Progressing   Problem: Education: Goal: Understanding of cardiac disease, CV risk reduction, and recovery process will improve Outcome: Not Progressing   Problem: Activity: Goal: Ability to return to baseline activity level will improve Outcome: Not Progressing   Problem: Cardiovascular: Goal: Ability to achieve and maintain adequate cardiovascular perfusion will improve Outcome: Progressing   Problem: Coping: Goal: Level of anxiety will decrease Outcome: Not Progressing

## 2024-01-12 NOTE — Inpatient Diabetes Management (Signed)
 Inpatient Diabetes Program Recommendations  AACE/ADA: New Consensus Statement on Inpatient Glycemic Control (2015)  Target Ranges:  Prepandial:   less than 140 mg/dL      Peak postprandial:   less than 180 mg/dL (1-2 hours)      Critically ill patients:  140 - 180 mg/dL   Lab Results  Component Value Date   GLUCAP 297 (H) 01/12/2024   HGBA1C 12.3 (H) 01/10/2024    Review of Glycemic Control  Diabetes history: DM2 Outpatient Diabetes medications: Lantus 25 units BID, glipizide 5 mg BID Current orders for Inpatient glycemic control: Lantus 25 BID, Novolog 0-15 TID with meals and 0-5 HS + 4 units TID  HgbA1C - > 15.5% 201, 182, 297 mg/dL  Inpatient Diabetes Program Recommendations:    Agree with orders.   Spoke with patient about diabetes and home regimen for diabetes control. Patient reports being followed by PCP for diabetes management and currently taking Lantus 25 units BID and glipizide 10 mg BID as an outpatient for diabetes control. Patient reports taking DM medications as prescribed and last saw his PCP on 09/16/23. Patient reports not checking glucose d/t not having a glucose meter. States she needs prescription for meter and supplies. Inquired about prior A1C and patient reports not being able to recall last A1C value, although she knew it was high. Discussed A1C results (> 15.5%% on ) and explained that current A1C indicates an average glucose of 385 mg/dl over the past 2-3 months. Discussed glucose and A1C goals. Discussed importance of checking CBGs and maintaining good CBG control to prevent long-term and short-term complications. Explained how hyperglycemia leads to damage within blood vessels which lead to the common complications seen with uncontrolled diabetes. Stressed to the patient the importance of improving glycemic control to prevent further complications from uncontrolled diabetes. Discussed impact of nutrition, exercise, stress, sickness, and medications on diabetes  control.  Discussed carbohydrates, carbohydrate goals per day and meal, along with portion sizes. Pt states her blood sugars are high d/t drinking Mike's Hard Lemonade every day. Discussed importance of leaving off ETOH until blood sugars are better controlled. Encouraged patient to check glucose 4 times per day (before meals and at bedtime) and to keep a log book of glucose readings and DM medication taken which patient will need to take to doctor appointments. Explained how the doctor can use the log book to continue to make adjustments with DM medications if needed.  Patient verbalized understanding of information discussed and reports no further questions at this time related to diabetes. Pt's mother and daughter were present in room.   Continue to follow.  Thank you. Ailene Ards, RD, LDN, CDCES Inpatient Diabetes Coordinator [phone]

## 2024-01-12 NOTE — Progress Notes (Signed)
 Transported to MRI on monitor after given IV Ativan per order for agitation and anxiety. Was unable to have MRI due to becoming combative w/ MRI tech. Remains disoriented * 4 and will not follow commands. No new changes noted from initial shift assessment. Eyes closed and restless intermittently, and gets agitated with any type of verbal or tactile stimuli. Continuing neuro checks per order and monitoring. VS stable. Telesitter in room. Floor mats down and bed in lowest position w/ call light in reach.

## 2024-01-12 NOTE — Progress Notes (Signed)
 PROGRESS NOTE  [name] ZOX:[id number] DOB: 1966/09/02   PCP: Hoy Register, MD  Patient is from: Home.  DOA: 01/09/2024 LOS: 2  Chief complaints Chief Complaint  Patient presents with   Chest Pain     Brief Narrative / Interim history:  58 y.o. F with PMH of CAD s/p PCI's, CVA, DVT, DM-2, sarcoidosis, HTN, HLD, anxiety, depression and chronic pain presented to the hospital with progressive chest pain and dyspnea for about 2 weeks and admitted by cardiology team with non-STEMI and hypertensive urgency on 3/9.  Patient had LHC that showed multivessel CAD on 3/10.  While in recovery, patient had decreased LOC, disoriented, left facial droop, right leg weakness and global aphasia.  Code stroke activated.  NIHSS score was 9.  She was last known normal about 9 AM.  CT head and CT angio head and neck without acute finding.  MRI brain ordered.  Hospitalist service called for assistance.    Subjective: Seen and examined earlier this morning.  No major events overnight of this morning.  Awake and alert and oriented x 4.  She was asking what happened after she had heart catheterization.  She is apologizing if she was rude to anyone.  Currently no complaints other than feeling hungry.  Denies chest pain, dyspnea, headache, focal numbness, weakness, GI or UTI symptoms.  Objective: Vitals:   01/12/24 0120 01/12/24 0250 01/12/24 0410 01/12/24 0812  BP: (!) 175/83 (!) 157/101 127/87 98/75  Pulse: 86 85 92 83  Resp: (!) 22 (!) 21 20 18   Temp:   97.8 F (36.6 C) 97.9 F (36.6 C)  TempSrc:   Oral Oral  SpO2: 92% 98% 98% 100%  Weight:      Height:        Examination:  GENERAL: No apparent distress.  Nontoxic. HEENT: MMM.  Vision and hearing grossly intact.  NECK: Supple.  No apparent JVD.  RESP:  No IWOB.  Fair aeration bilaterally. CVS:  RRR. Heart sounds normal.  ABD/GI/GU: BS+. Abd soft, NTND.  MSK/EXT:  Moves extremities. No apparent deformity. No edema.  SKIN: no apparent  skin lesion or wound NEURO: Awake, alert and oriented appropriately.  No apparent focal neuro deficit. PSYCH: Calm. Normal affect.   Procedures:  3/10-left heart catheterization showed multivessel CAD  Microbiology summarized: None  Assessment and plan: Non-STEMI/multivessel CAD-presented with progressive dyspnea and chest pain for 2 weeks.  Serial troponin negative.  EKG without acute ischemic finding.  LHC with multivessel CAD. -Defer to cardiology. -Cardiothoracic surgery consulted for CABG -Okay to resume antiplatelet/anticoagulation and other cardiac medications   Strokelike symptoms/delirium-likely iatrogenic from fentanyl and Versed she received during LHC.  B12, TSH, RPR, VBG, CT head and CT angio head and neck unrevealing.  Ammonia slightly elevated but no history of liver cirrhosis.  Delirium/stroke symptoms resolved.  She is currently awake and alert and oriented x 4. -Follow MRI brain and TTE -Passed strokes while eval per RN.  Ordered diet. -Minimize sedating medications -PT/OT/SLP eval -No need for safety sitter.  History of CVA: Strokelike symptoms as above likely from delirium.  CT head and CT angio head and neck unrevealing. -Follow MRI brain and TTE -Okay to resume antiplatelets and statin.  Elevated ammonia: No history of liver cirrhosis.  I do not see medication contributing either.  She is not encephalopathic now -Lactulose 20 g x 1 followed by as needed   Poorly controlled IDDM-2 with hyperglycemia and hyperlipidemia: A1c 12.3%.  LDL 135.  Seems to be  on Lantus 25 units twice daily and glipizide at home.   Recent Labs  Lab 01/11/24 1142 01/11/24 1732 01/11/24 2158 01/12/24 0442 01/12/24 0615  GLUCAP 247* 303* 195* 201* 182*  -Continue Semglee 25 units twice daily -Continue SSI-moderate -NovoLog 4 units 3 times daily with meals   Hypertensive urgency: Normotensive this morning. -Continue current cardiac medications per primary team   Anxiety and  depression: Unable to assess due to delirium. -Continue home Cymbalta.  Obesity Body mass index is 34.74 kg/m. -Encourage lifestyle change to lose weight          DVT prophylaxis:  Defer to primary.  Okay to resume anticoagulation or VTE prophylaxis  Code Status: Full code Family Communication: None at bedside Level of care: Telemetry Cardiac Status is: Inpatient   Final disposition: Per primary team.  No barrier from medical standpoint if MRI brain negative Consultants:  [facility] hospitalist Cardiothoracic surgery Neurology  55 minutes with more than 50% spent in reviewing records, counseling patient/family and coordinating care.   Sch Meds:  Scheduled Meds:  atorvastatin  80 mg Oral Daily   DULoxetine  60 mg Oral Daily   famotidine  20 mg Oral BID   furosemide  40 mg Oral BID   insulin aspart  0-15 Units Subcutaneous TID WC   insulin aspart  0-5 Units Subcutaneous QHS   insulin aspart  4 Units Subcutaneous TID WC   insulin glargine  25 Units Subcutaneous BID   lactulose  20 g Oral Once   Continuous Infusions:  nitroGLYCERIN Stopped (01/11/24 0939)   PRN Meds:.acetaminophen, haloperidol lactate, lactulose **FOLLOWED BY** lactulose, ondansetron (ZOFRAN) IV  Antimicrobials: Anti-infectives (From admission, onward)    None        I have personally reviewed the following labs and images: CBC: Recent Labs  Lab 01/09/24 2331 01/10/24 0446 01/11/24 0530 01/12/24 0731  WBC 7.4 6.6 4.2 8.0  NEUTROABS  --  4.1 2.1 5.5  HGB 13.6 14.6 13.6 13.5  HCT 39.7 43.1 40.2 39.2  MCV 97.8 98.9 99.0 97.8  PLT 201 211 173 209   BMP &GFR Recent Labs  Lab 01/09/24 2331 01/10/24 0446 01/11/24 0530 01/12/24 0731  NA 139 139 136 137  K 3.8 4.1 3.9 3.8  CL 103 105 105 104  CO2 26 24 23 27   GLUCOSE 422* 378* 281* 186*  BUN 13 16 13 7   CREATININE 0.80 0.86 0.78 0.83  CALCIUM 9.1 9.3 8.4* 8.6*  MG  --  2.0 1.8 1.8   Estimated Creatinine Clearance: 83.1 mL/min (by  C-G formula based on SCr of 0.83 mg/dL). Liver & Pancreas: No results for input(s): "AST", "ALT", "ALKPHOS", "BILITOT", "PROT", "ALBUMIN" in the last 168 hours. No results for input(s): "LIPASE", "AMYLASE" in the last 168 hours. Recent Labs  Lab 01/11/24 2140  AMMONIA 64*   Diabetic: Recent Labs    01/10/24 0446  HGBA1C 12.3*   Recent Labs  Lab 01/11/24 1142 01/11/24 1732 01/11/24 2158 01/12/24 0442 01/12/24 0615  GLUCAP 247* 303* 195* 201* 182*   Cardiac Enzymes: No results for input(s): "CKTOTAL", "CKMB", "CKMBINDEX", "TROPONINI" in the last 168 hours. No results for input(s): "PROBNP" in the last 8760 hours. Coagulation Profile: Recent Labs  Lab 01/11/24 2140  INR 1.0   Thyroid Function Tests: Recent Labs    01/11/24 2140  TSH 0.498   Lipid Profile: Recent Labs    01/10/24 0446  CHOL 249*  HDL 42  LDLCALC 135*  TRIG 361*  CHOLHDL 5.9  Anemia Panel: Recent Labs    01/11/24 2140  VITAMINB12 407   Urine analysis:    Component Value Date/Time   COLORURINE STRAW (A) 11/12/2022 2312   APPEARANCEUR CLEAR 11/12/2022 2312   APPEARANCEUR Clear 09/01/2022 1130   LABSPEC 1.029 11/12/2022 2312   PHURINE 6.0 11/12/2022 2312   GLUCOSEU >=500 (A) 11/12/2022 2312   HGBUR NEGATIVE 11/12/2022 2312   BILIRUBINUR NEGATIVE 11/12/2022 2312   BILIRUBINUR Negative 09/01/2022 1130   KETONESUR 20 (A) 11/12/2022 2312   PROTEINUR NEGATIVE 11/12/2022 2312   UROBILINOGEN 0.2 03/16/2021 1640   NITRITE NEGATIVE 11/12/2022 2312   LEUKOCYTESUR NEGATIVE 11/12/2022 2312   Sepsis Labs: Invalid input(s): "PROCALCITONIN", "LACTICIDVEN"  Microbiology: No results found for this or any previous visit (from the past 240 hours).  Radiology Studies: CT ANGIO HEAD NECK W WO CM (CODE STROKE) Result Date: 01/11/2024 CLINICAL DATA:  Neuro deficit, acute, stroke suspected. EXAM: CT ANGIOGRAPHY HEAD AND NECK WITH AND WITHOUT CONTRAST TECHNIQUE: Multidetector CT imaging of the head  and neck was performed using the standard protocol during bolus administration of intravenous contrast. Multiplanar CT image reconstructions and MIPs were obtained to evaluate the vascular anatomy. Carotid stenosis measurements (when applicable) are obtained utilizing NASCET criteria, using the distal internal carotid diameter as the denominator. RADIATION DOSE REDUCTION: This exam was performed according to the departmental dose-optimization program which includes automated exposure control, adjustment of the mA and/or kV according to patient size and/or use of iterative reconstruction technique. CONTRAST:  75mL OMNIPAQUE IOHEXOL 350 MG/ML SOLN COMPARISON:  Head CT earlier same day. MRI 03/31/2022. CT angiography 03/30/2022. FINDINGS: CTA NECK FINDINGS Aortic arch: No aortic atherosclerotic calcification is seen. Branching pattern is normal. Right carotid system: Common carotid artery widely patent to the bifurcation. Calcified plaque at the carotid bifurcation and ICA bulb but no stenosis. Cervical ICA patent to the skull base. Left carotid system: Common carotid artery widely patent to the bifurcation. Soft and calcified plaque at the carotid bifurcation and ICA bulb but no stenosis. Cervical ICA patent to the skull base. Vertebral arteries: Both vertebral artery origins are patent. Both vessels are patent through the cervical region to the foramen magnum. Skeleton: Ordinary spondylosis. Other neck: Enlarged heterogeneous thyroid gland. Apparent enlarging lower density component at the inferior left lobe. As recommended previously, thyroid ultrasound is suggested evaluate for appropriateness of biopsy. Upper chest: Lung apices are clear. Review of the MIP images confirms the above findings CTA HEAD FINDINGS Anterior circulation: Both internal carotid arteries are patent through the skull base and siphon regions. The anterior and middle cerebral vessels are patent. No large vessel occlusion or proximal stenosis.  No aneurysm or vascular malformation. Posterior circulation: Both vertebral arteries widely patent to the basilar artery. No basilar stenosis. Posterior circulation branch vessels are patent. Patent bilateral posterior communicating arteries. Venous sinuses: Patent and normal. Anatomic variants: None significant. Review of the MIP images confirms the above findings IMPRESSION: 1. No intracranial large vessel occlusion or proximal stenosis. 2. Atherosclerotic change at both carotid bifurcations but without stenosis. 3. Enlarged heterogeneous thyroid gland. Apparent enlarging lower density component at the inferior left lobe. As recommended previously, thyroid ultrasound is suggested evaluate for appropriateness of biopsy. Electronically Signed   By: Paulina Fusi M.D.   On: 01/11/2024 13:26   CT HEAD CODE STROKE WO CONTRAST Result Date: 01/11/2024 CLINICAL DATA:  Code stroke. Neuro deficit, acute, stroke suspected. Left eye pain during heart catheterization. Altered mental status. EXAM: CT HEAD WITHOUT CONTRAST TECHNIQUE: Contiguous axial images  were obtained from the base of the skull through the vertex without intravenous contrast. RADIATION DOSE REDUCTION: This exam was performed according to the departmental dose-optimization program which includes automated exposure control, adjustment of the mA and/or kV according to patient size and/or use of iterative reconstruction technique. COMPARISON:  MRI 03/31/2022 FINDINGS: Brain: No acute CT finding. Mild/minimal small vessel change of the cerebral hemispheric white matter. No sign of acute infarction, mass lesion, hemorrhage, hydrocephalus or extra-axial collection. Vascular: There is atherosclerotic calcification of the major vessels at the base of the brain. Skull: Negative Sinuses/Orbits: Clear/normal Other: None ASPECTS (Alberta Stroke Program Early CT Score) - Ganglionic level infarction (caudate, lentiform nuclei, internal capsule, insula, M1-M3 cortex): 7 -  Supraganglionic infarction (M4-M6 cortex): 3 Total score (0-10 with 10 being normal): 10 IMPRESSION: 1. No acute CT finding. Mild/minimal small vessel change of the cerebral hemispheric white matter. 2. Aspects is 10. 3. These results were communicated to Dr. Selina Cooley at 12:56 pm on 01/11/2024 by text page via the [facility] messaging system. Electronically Signed   By: Paulina Fusi M.D.   On: 01/11/2024 12:57      Harshaan Whang T. Hobart Marte [facility]  If 7PM-7AM, please contact night-coverage [email or web address] 01/12/2024, 10:45 AM

## 2024-01-12 NOTE — Progress Notes (Addendum)
 Patient Name: [name] Date of Encounter: 01/12/2024 [facility] Cardiologist: Tucholski Ishikawa, MD   Patient Summary  .    58 y/o woman with [place] CAD-PCI in setting of NSTEMI, HTN, DM-2 (on insuling) & HLD (poorly controlled) & prior CVA who presented early Am 3/9 with elevated BPs (SBPs in 180-190s) & Chest Pain concerning for ACS.    Interval Summary  .    Yesterday's events-cardiac catheterization in the morning revealing left main disease as well as D1.  During the cath given nitroglycerin and subsequently developed a headache and altered mental status.  In the postop unit code stroke called because of delirium confusion and agitation as well as somnolence.  Was given sedation for CT/CTA which did not show any significant acute findings but was not able to get the MRI done until later on in the evening again getting sedation.  Throughout the course the day if she was not following commands and not responsive a separate answer yes or no or high.  Was occasionally combative.  This morning, wide-awake, lucid speech has no recollection of yesterday.  Still notes some aching in the chest but does have chest pain while walking to the bathroom.  Assessment & Plan .   Principal Problem:   Acute coronary syndrome (HCC) Active Problems:   H/o NSTEMI (non-ST elevated myocardial infarction) (HCC)   CAD S/P percutaneous coronary angioplasty   Coronary artery disease involving native coronary artery of native heart with unstable angina pectoris (HCC)   Essential hypertension, benign   Uncontrolled type 2 diabetes mellitus with hyperglycemia, with long-term current use of insulin (HCC)   Hyperlipidemia associated with type 2 diabetes mellitus (HCC)   Hemiparesis affecting left side as late effect of cerebrovascular accident [facility])   Principal Problem:   Acute coronary syndrome (HCC) / CAD S/P percutaneous coronary angioplasty /   Coronary artery disease involving native  coronary artery of native heart with unstable angina pectoris (HCC)    Essential hypertension, benign /Hyperlipidemia associated with type 2 diabetes mellitus (HCC)    Uncontrolled type 2 diabetes mellitus with hyperglycemia, with long-term current use of insulin (HCC) Presented with chest pain concerning for progressive angina with troponin elevations felt to be ACS/unstable angina Cardiac catheter revealed Left Main disease with severe ostial D1, moderate LCx and LAD disease as well as CTO of stented PDA.  (Unfortunately cardiac cath complicated by altered mental status and eventual evaluation finding punctate CVA) We will restart Imdur with nitroglycerin discontinued As she is unlikely to be a CABG candidate, will reinitiate Plavix On Zetia, will convert from atorvastatin to rosuvastatin 40 mg Had labile blood pressures overnight, meds currently on hold, anticipate restarting carvedilol and losartan once pressure stabilized. Long talk with the family and patient today as she is wide-awake.  Will touch back base with CVTS, however I doubt that she will be a good candidate based on recent events.  I did review images with Dr. [place] who agrees that the left main is potentially treatable without need for mechanical support and should be a focal left main stent that is doable.  At this point options would be left main stent versus medical management until stabilized for possible CABG consideration however with her ongoing symptoms I think we need to act sooner than later.  Unfortunately her CVA makes things more difficult as he would like to ensure that she is neurologically stable prior to considering other invasive procedure. If PCI of the LAD is planned,  would go from femoral access to avoid the difficulty with radial access and would use minimal to no sedation.  On Carvedilol 25 mg BID & Losartan 50 mg daily => Concern for Hypertensive Urgency on admission.Pressures have been somewhat labile on NTG  gtt --> likely increase to 100 mg Losartan on d/c DM-2 (poorly controlled with A1c over 12): SSI with home Glargine; glipizide on hold => appreciate TRH assistance. HLD: Will convert from atorvastatin to rosuvastatin 40 mg to see if she can take the pill.  Acute Periprocedural Stroke and delirium:3 small punctate lesion seen on brain MRI.  Initially, these are isolated in the same location as her prior stroke and been.  Appreciate neurology and internal medicine consultation and follow-up Treating risk factors as for CAD Restarting Plavix Temporarily allowing for brain pressure to be elevated after permissive hypertension provided her chest pain is stable.  Hypertensive Heart Disease with CHF: Hyperdynamic LV function with indeterminate filling pressures, however severely elevated LVEDP on cath As BP tolerates (currently allowing for permissive hypertension for CVA), will reinitiate beta-blocker and ARB Continue 40 mg twice daily Lasix was given IV dose yesterday with decent diuresis.  Seems to be pretty much euvolemic at the present.  Chronic Conditions #Chronic pain: Holding gabapentin right now based on confusion #Anxiety/depression: Continue duloxetine 60 mg. #GERD: Continue famotidine 20 mg twice daily  Vital Signs .    Vitals:   01/12/24 0250 01/12/24 0410 01/12/24 0812 01/12/24 1230  BP: (!) 157/101 127/87 98/75 90/64   Pulse: 85 92 83 76  Resp: (!) 21 20 18 18   Temp:  97.8 F (36.6 C) 97.9 F (36.6 C) 97.6 F (36.4 C)  TempSrc:  Oral Oral Axillary  SpO2: 98% 98% 100% 97%  Weight:      Height:        Intake/Output Summary (Last 24 hours) at 01/12/2024 1308 Last data filed at 01/12/2024 1100 Gross per 24 hour  Intake 338.4 ml  Output --  Net 338.4 ml      01/11/2024    2:50 PM 01/09/2024   10:37 PM 09/16/2023    3:56 PM  Last 3 Weights  Weight (lbs) 208 lb 12.4 oz 216 lb 4.3 oz 216 lb 3.2 oz  Weight (kg) 94.7 kg 98.1 kg 98.068 kg      Lab Results  Component Value  Date   NA 137 01/12/2024   CL 104 01/12/2024   K 3.8 01/12/2024   CO2 27 01/12/2024   BUN 7 01/12/2024   CREATININE 0.83 01/12/2024   GFRNONAA >60 01/12/2024   CALCIUM 8.6 (L) 01/12/2024   PHOS 3.6 03/30/2022   ALBUMIN 3.2 (L) 11/12/2022   GLUCOSE 186 (H) 01/12/2024   Lab Results  Component Value Date   HGBA1C 12.3 (H) 01/10/2024   Lab Results  Component Value Date   CHOL 249 (H) 01/10/2024   HDL 42 01/10/2024   LDLCALC 135 (H) 01/10/2024   TRIG 361 (H) 01/10/2024   CHOLHDL 5.9 01/10/2024   Telemetry/ECG/Cardiac Studies    NSR - Personally Reviewed  No New EKG today. EKG from yesterday (01/11/2024: Sinus rhythm, rate 60 bpm.  Borderline LVH criteria.  No significant other findings besides artifact.  Cardiac Cath 01/11/2024: Significant MV CAD-ostial, eccentric 70% LM, 80% ostial small D2, long segment of mid LAD up to 50%.  60% proximal LCx and CTO of overlapping stents in the PDA.  Widely patent LCx stent.  EF.  LVEDP 35 mmHg.--Noted difficulty with radial access! => Recommendation was  to consider CABG versus left main PCI.  Increased diuretic with 40 mg IV twice daily  Echo 01/12/2024: Hyperdynamic LV with EF of 70 to 75%.  No regional wall motion normality.  Mild concentric LVH.  Normal PAP.  Possible liver cyst.  Radiology:: CT Head with/without Contrast 01/11/2024: No acute findings, mild to minimal small vessel change.  CTA showed no large vessel occlusion or proximal stenosis.  Bilateral carotid bifurcation atherosclerosis enlarged thyroid MRI brain 01/12/2024:3 punctate acute/subacute infarcts seen in the right cerebellum and bilateral cerebral cortex.  Chronic small vessel disease.  Motion artifact noted.  Physical Exam .   GEN: No acute distress.  Obese; much more awake and alert.  Clear/coherent speech. Neck: No JVD or bruit Cardiac: RRR with distant S1 and S2.  No M/R/G. Respiratory: CTAB, nonlabored and good air movement. GI: Soft/NT/ND STEMI BS.  No HSM.   Obese. MS: No edema Neuro: Stable left leg and arm weakness and numbness.  No facial droop.   For questions or updates, please contact [facility] Please consult [email or web address] for contact info under   I spent 74 minutes in the care of Zyrah Wiswell Yarrow today including reviewing labs (2 minutes), reviewing studies (15 minutes including reviewing of the CT scans, MRI brain, echo as well as reviewing cardiac cath films with Dr. [place]), face to face time discussing treatment options (35 minutes), reviewing records from neurology and [facility] consultation notes (9 minutes), 13 minutes dictating, and documenting in the encounter.        Signed, Bryan Lemma, MD

## 2024-01-12 NOTE — Progress Notes (Addendum)
 STROKE TEAM PROGRESS NOTE    SIGNIFICANT HOSPITAL EVENTS 3/9-patient admitted with plans for cardiac cath 3/10-patient underwent cardiac cath and developed acute onset confusion with aphasia, left facial droop and right lower extremity weakness during procedure  INTERIM HISTORY/SUBJECTIVE Patient stated her confusion and disorientation has improved but she is not back to her baseline. Patient has remained hemodynamically stable and afebrile overnight.  Aphasia has resolved.  MRI reveals 3 punctate infarcts in right cerebellum and bilateral cerebral cortex.  OBJECTIVE  CBC    Component Value Date/Time   WBC 8.0 01/12/2024 0731   RBC 4.01 01/12/2024 0731   HGB 13.5 01/12/2024 0731   HGB 14.8 07/03/2023 1005   HCT 39.2 01/12/2024 0731   HCT 44.1 07/03/2023 1005   PLT 209 01/12/2024 0731   PLT 255 07/03/2023 1005   MCV 97.8 01/12/2024 0731   MCV 97 07/03/2023 1005   MCH 33.7 01/12/2024 0731   MCHC 34.4 01/12/2024 0731   RDW 12.8 01/12/2024 0731   RDW 12.3 07/03/2023 1005   LYMPHSABS 1.8 01/12/2024 0731   LYMPHSABS 1.7 10/08/2022 1035   MONOABS 0.6 01/12/2024 0731   EOSABS 0.0 01/12/2024 0731   EOSABS 0.1 10/08/2022 1035   BASOSABS 0.1 01/12/2024 0731   BASOSABS 0.0 10/08/2022 1035    BMET    Component Value Date/Time   NA 137 01/12/2024 0731   NA 137 07/03/2023 1005   K 3.8 01/12/2024 0731   CL 104 01/12/2024 0731   CO2 27 01/12/2024 0731   GLUCOSE 186 (H) 01/12/2024 0731   BUN 7 01/12/2024 0731   BUN 11 07/03/2023 1005   CREATININE 0.83 01/12/2024 0731   CALCIUM 8.6 (L) 01/12/2024 0731   EGFR 98 07/03/2023 1005   GFRNONAA >60 01/12/2024 0731    IMAGING past 24 hours MR BRAIN WO CONTRAST Result Date: 01/12/2024 CLINICAL DATA:  Confusion after catheterization. EXAM: MRI HEAD WITHOUT CONTRAST TECHNIQUE: Multiplanar, multiecho pulse sequences of the brain and surrounding structures were obtained without intravenous contrast. COMPARISON:  Head CT and CTA from  yesterday.  Brain MRI 03/31/2022 FINDINGS: Brain: Small acute infarct in the right cerebellum, left occipital cortex, and right parietal cortex. The supratentorial infarcts are small and confound ADC localization, possibly subacute. Mild chronic small vessel ischemia in the cerebral white matter. No abnormal mineralization. No hemorrhage, hydrocephalus, mass, or collection. Vascular: Major flow voids are preserved Skull and upper cervical spine: Normal marrow signal Sinuses/Orbits: No acute finding. Other: Motion artifact intermittently moderate to severe. IMPRESSION: 3 punctate acute to subacute infarcts seen in the right cerebellum and bilateral cerebral cortex. Chronic small vessel ischemia. Intermittently significant motion artifact. Electronically Signed   By: Tiburcio Pea M.D.   On: 01/12/2024 11:59   CT ANGIO HEAD NECK W WO CM (CODE STROKE) Result Date: 01/11/2024 CLINICAL DATA:  Neuro deficit, acute, stroke suspected. EXAM: CT ANGIOGRAPHY HEAD AND NECK WITH AND WITHOUT CONTRAST TECHNIQUE: Multidetector CT imaging of the head and neck was performed using the standard protocol during bolus administration of intravenous contrast. Multiplanar CT image reconstructions and MIPs were obtained to evaluate the vascular anatomy. Carotid stenosis measurements (when applicable) are obtained utilizing NASCET criteria, using the distal internal carotid diameter as the denominator. RADIATION DOSE REDUCTION: This exam was performed according to the departmental dose-optimization program which includes automated exposure control, adjustment of the mA and/or kV according to patient size and/or use of iterative reconstruction technique. CONTRAST:  75mL OMNIPAQUE IOHEXOL 350 MG/ML SOLN COMPARISON:  Head CT earlier same day. MRI  03/31/2022. CT angiography 03/30/2022. FINDINGS: CTA NECK FINDINGS Aortic arch: No aortic atherosclerotic calcification is seen. Branching pattern is normal. Right carotid system: Common carotid  artery widely patent to the bifurcation. Calcified plaque at the carotid bifurcation and ICA bulb but no stenosis. Cervical ICA patent to the skull base. Left carotid system: Common carotid artery widely patent to the bifurcation. Soft and calcified plaque at the carotid bifurcation and ICA bulb but no stenosis. Cervical ICA patent to the skull base. Vertebral arteries: Both vertebral artery origins are patent. Both vessels are patent through the cervical region to the foramen magnum. Skeleton: Ordinary spondylosis. Other neck: Enlarged heterogeneous thyroid gland. Apparent enlarging lower density component at the inferior left lobe. As recommended previously, thyroid ultrasound is suggested evaluate for appropriateness of biopsy. Upper chest: Lung apices are clear. Review of the MIP images confirms the above findings CTA HEAD FINDINGS Anterior circulation: Both internal carotid arteries are patent through the skull base and siphon regions. The anterior and middle cerebral vessels are patent. No large vessel occlusion or proximal stenosis. No aneurysm or vascular malformation. Posterior circulation: Both vertebral arteries widely patent to the basilar artery. No basilar stenosis. Posterior circulation branch vessels are patent. Patent bilateral posterior communicating arteries. Venous sinuses: Patent and normal. Anatomic variants: None significant. Review of the MIP images confirms the above findings IMPRESSION: 1. No intracranial large vessel occlusion or proximal stenosis. 2. Atherosclerotic change at both carotid bifurcations but without stenosis. 3. Enlarged heterogeneous thyroid gland. Apparent enlarging lower density component at the inferior left lobe. As recommended previously, thyroid ultrasound is suggested evaluate for appropriateness of biopsy. Electronically Signed   By: Paulina Fusi M.D.   On: 01/11/2024 13:26   CT HEAD CODE STROKE WO CONTRAST Result Date: 01/11/2024 CLINICAL DATA:  Code stroke.  Neuro deficit, acute, stroke suspected. Left eye pain during heart catheterization. Altered mental status. EXAM: CT HEAD WITHOUT CONTRAST TECHNIQUE: Contiguous axial images were obtained from the base of the skull through the vertex without intravenous contrast. RADIATION DOSE REDUCTION: This exam was performed according to the departmental dose-optimization program which includes automated exposure control, adjustment of the mA and/or kV according to patient size and/or use of iterative reconstruction technique. COMPARISON:  MRI 03/31/2022 FINDINGS: Brain: No acute CT finding. Mild/minimal small vessel change of the cerebral hemispheric white matter. No sign of acute infarction, mass lesion, hemorrhage, hydrocephalus or extra-axial collection. Vascular: There is atherosclerotic calcification of the major vessels at the base of the brain. Skull: Negative Sinuses/Orbits: Clear/normal Other: None ASPECTS (Alberta Stroke Program Early CT Score) - Ganglionic level infarction (caudate, lentiform nuclei, internal capsule, insula, M1-M3 cortex): 7 - Supraganglionic infarction (M4-M6 cortex): 3 Total score (0-10 with 10 being normal): 10 IMPRESSION: 1. No acute CT finding. Mild/minimal small vessel change of the cerebral hemispheric white matter. 2. Aspects is 10. 3. These results were communicated to Dr. Selina Cooley at 12:56 pm on 01/11/2024 by text page via the [facility] messaging system. Electronically Signed   By: Paulina Fusi M.D.   On: 01/11/2024 12:57    Vitals:   01/12/24 0120 01/12/24 0250 01/12/24 0410 01/12/24 0812  BP: (!) 175/83 (!) 157/101 127/87 98/75  Pulse: 86 85 92 83  Resp: (!) 22 (!) 21 20 18   Temp:   97.8 F (36.6 C) 97.9 F (36.6 C)  TempSrc:   Oral Oral  SpO2: 92% 98% 98% 100%  Weight:      Height:         PHYSICAL EXAM General:  Alert, well-nourished, well-developed patient in no acute distress Psych:  Mood and affect appropriate for situation CV: Regular rate and rhythm on  monitor Respiratory:  Regular, unlabored respirations on room air   NEURO:  Mental Status: AA&Ox3, patient is able to give clear and coherent history Speech/Language: speech is without dysarthria or aphasia.    Cranial Nerves:  II: PERRL.  ? Right hemianopsia as inconsistent in her responses on the right III, IV, VI: EOMI. Eyelids elevate symmetrically.  VII: Face is symmetrical resting and smiling VIII: hearing intact to voice. IX, X: Phonation is normal.  XII: tongue is midline without fasciculations. Motor: 5/5 strength to all muscle groups tested, left leg slightly weaker than right.  Tone: is normal and bulk is normal Sensation- Intact to light touch bilaterally but diminished in the left leg Coordination: FTN intact bilaterally Gait- deferred  Most Recent NIH  1a Level of Conscious.: 0 1b LOC Questions: 0 1c LOC Commands: 0 2 Best Gaze: 0 3 Visual: 1 4 Facial Palsy: 0 5a Motor Arm - left: 0 5b Motor Arm - Right: 0 6a Motor Leg - Left: 0 6b Motor Leg - Right: 0 7 Limb Ataxia: 0 8 Sensory:  1 9 Best Language: 0 10 Dysarthria: 0 11 Extinct. and Inatten.: 0 TOTAL: 2   ASSESSMENT/PLAN  [name] is a 58 y.o. female with history of CAD with multiple stents, DVT, diabetes, hypertension, hyperlipidemia and stroke who was originally admitted for cardiac cath in the setting of chest pain.  She underwent this procedure yesterday and during the procedure, she developed aphasia, confusion, left facial droop and right lower extremity weakness.  MRI reveals 3 punctate infarcts in the bilateral cerebral cortex and right cerebellum.  Initial NIH 9  Acute Ischemic Infarct: 3 scattered punctate infarcts Etiology: Periprocedural infarcts in the setting of cardiac cath Code Stroke CT head No acute abnormality. ASPECTS 10.    CTA head & neck no LVO or proximal stenosis, enlarged heterogenous thyroid gland, recommend outpatient thyroid ultrasound MRI punctate infarcts in  bilateral cerebral hemispheres and right cerebellum 2D Echo pending LDL 135 HgbA1c 12.3 VTE prophylaxis -SCDs clopidogrel 75 mg daily prior to admission, now on clopidogrel 75 mg daily unable to take aspirin due to allergy Therapy recommendations:  Pending Disposition: Pending, likely home  Hx of Stroke/TIA Patient has history of stroke with residual left leg weakness  Hypertension Home meds: Losartan 50 mg daily Stable Blood Pressure Goal: BP less than 220/110   Hyperlipidemia Home meds: Atorvastatin 80 mg daily, resumed in hospital LDL 135, goal < 70 Add ezetimibe 10 mg daily Continue statin at discharge  Diabetes type II Uncontrolled Home meds: Glipizide 10 mg twice daily, Lantus insulin 25 units twice daily HgbA1c 12.3, goal < 7.0 Diabetes coordinator consult CBGs SSI Recommend close follow-up with PCP for better DM control  Multivessel CAD with non-STEMI Patient underwent cardiac cath 3/10 revealing multivessel disease Plan for possible CABG in the future Management per cardiology team  Other Stroke Risk Factors Obesity, Body mass index is 34.74 kg/m., BMI >/= 30 associated with increased stroke risk, recommend weight loss, diet and exercise as appropriate  Coronary artery disease   Other Active Problems None  Hospital day # 2  Patient seen by NP with MD, MD to edit note as needed. Cortney E Ernestina Columbia , MSN, AGACNP-BC [facility] See Amion for schedule and pager information 01/12/2024 12:31 PM   I have personally obtained history,examined this patient, reviewed notes,  independently viewed imaging studies, participated in medical decision making and plan of care.ROS completed by me personally and pertinent positives fully documented  I have made any additions or clarifications directly to the above note. Agree with note above.  Patient developed sudden onset of confusion and facial and extremity weakness following cardiac cath procedure and  appears to be improving.  MRI confirmed small embolic infarcts likely periprocedural.  Continue Plavix alone as patient is unable to tolerate aspirin due to allergy.  Aggressive risk factor modification.  Mobilize out of bed.  Therapy consults.  Continue ongoing stroke workup.  Greater than 50% time during this 50-minute visit was spent on counseling and coordination of care and discussion patient and care team answering questions.  Delia Heady, MD Medical Director [facility] Pager: [phone] 01/12/2024 3:26 PM   To contact Stroke Continuity provider, please refer to [email or web address]. After hours, contact General Neurology

## 2024-01-13 ENCOUNTER — Other Ambulatory Visit (HOSPITAL_COMMUNITY): Payer: Self-pay

## 2024-01-13 ENCOUNTER — Telehealth (HOSPITAL_COMMUNITY): Payer: Self-pay | Admitting: Pharmacy Technician

## 2024-01-13 ENCOUNTER — Other Ambulatory Visit: Payer: Self-pay

## 2024-01-13 DIAGNOSIS — I249 Acute ischemic heart disease, unspecified: Secondary | ICD-10-CM | POA: Diagnosis not present

## 2024-01-13 DIAGNOSIS — I10 Essential (primary) hypertension: Secondary | ICD-10-CM | POA: Diagnosis not present

## 2024-01-13 DIAGNOSIS — I110 Hypertensive heart disease with heart failure: Secondary | ICD-10-CM

## 2024-01-13 DIAGNOSIS — I6389 Other cerebral infarction: Secondary | ICD-10-CM | POA: Diagnosis not present

## 2024-01-13 DIAGNOSIS — R29702 NIHSS score 2: Secondary | ICD-10-CM | POA: Diagnosis not present

## 2024-01-13 DIAGNOSIS — I214 Non-ST elevation (NSTEMI) myocardial infarction: Secondary | ICD-10-CM | POA: Diagnosis not present

## 2024-01-13 DIAGNOSIS — I6340 Cerebral infarction due to embolism of unspecified cerebral artery: Secondary | ICD-10-CM | POA: Diagnosis not present

## 2024-01-13 DIAGNOSIS — I25110 Atherosclerotic heart disease of native coronary artery with unstable angina pectoris: Secondary | ICD-10-CM | POA: Diagnosis not present

## 2024-01-13 DIAGNOSIS — E785 Hyperlipidemia, unspecified: Secondary | ICD-10-CM | POA: Diagnosis not present

## 2024-01-13 DIAGNOSIS — E119 Type 2 diabetes mellitus without complications: Secondary | ICD-10-CM | POA: Diagnosis not present

## 2024-01-13 LAB — BASIC METABOLIC PANEL
Anion gap: 7 (ref 5–15)
BUN: 13 mg/dL (ref 6–20)
CO2: 27 mmol/L (ref 22–32)
Calcium: 8.5 mg/dL — ABNORMAL LOW (ref 8.9–10.3)
Chloride: 102 mmol/L (ref 98–111)
Creatinine, Ser: 1.09 mg/dL — ABNORMAL HIGH (ref 0.44–1.00)
GFR, Estimated: 59 mL/min — ABNORMAL LOW (ref >60)
Glucose, Bld: 221 mg/dL — ABNORMAL HIGH (ref 70–99)
Potassium: 3.6 mmol/L (ref 3.5–5.1)
Sodium: 136 mmol/L (ref 135–145)

## 2024-01-13 LAB — GLUCOSE, CAPILLARY
Glucose-Capillary: 192 mg/dL — ABNORMAL HIGH (ref 70–99)
Glucose-Capillary: 252 mg/dL — ABNORMAL HIGH (ref 70–99)

## 2024-01-13 LAB — CBC WITH DIFFERENTIAL/PLATELET
Abs Immature Granulocytes: 0.01 10*3/uL (ref 0.00–0.07)
Basophils Absolute: 0.1 10*3/uL (ref 0.0–0.1)
Basophils Relative: 1 %
Eosinophils Absolute: 0.1 10*3/uL (ref 0.0–0.5)
Eosinophils Relative: 1 %
HCT: 38.9 % (ref 36.0–46.0)
Hemoglobin: 13.2 g/dL (ref 12.0–15.0)
Immature Granulocytes: 0 %
Lymphocytes Relative: 35 %
Lymphs Abs: 2 10*3/uL (ref 0.7–4.0)
MCH: 33.5 pg (ref 26.0–34.0)
MCHC: 33.9 g/dL (ref 30.0–36.0)
MCV: 98.7 fL (ref 80.0–100.0)
Monocytes Absolute: 0.5 10*3/uL (ref 0.1–1.0)
Monocytes Relative: 9 %
Neutro Abs: 3.2 10*3/uL (ref 1.7–7.7)
Neutrophils Relative %: 54 %
Platelets: 183 10*3/uL (ref 150–400)
RBC: 3.94 MIL/uL (ref 3.87–5.11)
RDW: 12.9 % (ref 11.5–15.5)
WBC: 5.9 10*3/uL (ref 4.0–10.5)
nRBC: 0 % (ref 0.0–0.2)

## 2024-01-13 LAB — MAGNESIUM: Magnesium: 1.8 mg/dL (ref 1.7–2.4)

## 2024-01-13 LAB — PLATELET INHIBITION P2Y12: Platelet Function  P2Y12: 213 [PRU] (ref 182–335)

## 2024-01-13 MED ORDER — TICAGRELOR 90 MG PO TABS
180.0000 mg | ORAL_TABLET | ORAL | Status: AC
  Administered 2024-01-13: 180 mg via ORAL
  Filled 2024-01-13: qty 2

## 2024-01-13 MED ORDER — CARVEDILOL 3.125 MG PO TABS
3.1250 mg | ORAL_TABLET | Freq: Two times a day (BID) | ORAL | Status: DC
  Administered 2024-01-13: 3.125 mg via ORAL
  Filled 2024-01-13: qty 1

## 2024-01-13 MED ORDER — LOSARTAN POTASSIUM 25 MG PO TABS: 25.0000 mg | ORAL_TABLET | Freq: Every day | ORAL | Status: DC

## 2024-01-13 MED ORDER — INSULIN GLARGINE 100 UNIT/ML ~~LOC~~ SOLN
35.0000 [IU] | Freq: Two times a day (BID) | SUBCUTANEOUS | Status: DC
  Filled 2024-01-13: qty 0.35

## 2024-01-13 MED ORDER — POTASSIUM CHLORIDE CRYS ER 10 MEQ PO TBCR
20.0000 meq | EXTENDED_RELEASE_TABLET | Freq: Every day | ORAL | 1 refills | Status: DC
  Filled 2024-01-13 – 2024-02-12 (×2): qty 60, 30d supply, fill #0

## 2024-01-13 MED ORDER — ROSUVASTATIN CALCIUM 40 MG PO TABS
40.0000 mg | ORAL_TABLET | Freq: Every day | ORAL | 3 refills | Status: DC
  Filled 2024-01-13 – 2024-02-12 (×2): qty 30, 30d supply, fill #0
  Filled 2024-04-04 (×2): qty 30, 30d supply, fill #1
  Filled 2024-06-07: qty 30, 30d supply, fill #2

## 2024-01-13 MED ORDER — TICAGRELOR 90 MG PO TABS: 90.0000 mg | ORAL_TABLET | Freq: Two times a day (BID) | ORAL | Status: DC

## 2024-01-13 MED ORDER — INSULIN GLARGINE 100 UNIT/ML ~~LOC~~ SOLN
28.0000 [IU] | Freq: Two times a day (BID) | SUBCUTANEOUS | Status: DC
  Administered 2024-01-13: 28 [IU] via SUBCUTANEOUS
  Filled 2024-01-13 (×2): qty 0.28

## 2024-01-13 MED ORDER — LANTUS SOLOSTAR 100 UNIT/ML ~~LOC~~ SOPN
35.0000 [IU] | PEN_INJECTOR | Freq: Two times a day (BID) | SUBCUTANEOUS | 3 refills | Status: AC
  Filled 2024-01-13: qty 30, 43d supply, fill #0

## 2024-01-13 MED ORDER — INSULIN ASPART 100 UNIT/ML IJ SOLN: 5.0000 [IU] | Freq: Three times a day (TID) | INTRAMUSCULAR | Status: DC

## 2024-01-13 MED ORDER — TICAGRELOR 90 MG PO TABS
90.0000 mg | ORAL_TABLET | Freq: Two times a day (BID) | ORAL | 3 refills | Status: DC
  Filled 2024-01-13 – 2024-02-12 (×2): qty 60, 30d supply, fill #0
  Filled 2024-04-04 – 2024-04-05 (×3): qty 60, 30d supply, fill #1
  Filled 2024-06-07: qty 60, 30d supply, fill #2

## 2024-01-13 MED ORDER — CARVEDILOL 3.125 MG PO TABS
3.1250 mg | ORAL_TABLET | Freq: Two times a day (BID) | ORAL | 1 refills | Status: DC
  Filled 2024-01-13: qty 30, 15d supply, fill #0
  Filled 2024-01-13 – 2024-02-12 (×2): qty 60, 30d supply, fill #0

## 2024-01-13 MED ORDER — FUROSEMIDE 40 MG PO TABS
40.0000 mg | ORAL_TABLET | Freq: Every day | ORAL | 1 refills | Status: DC
  Filled 2024-01-13 – 2024-02-12 (×2): qty 30, 30d supply, fill #0

## 2024-01-13 MED ORDER — EZETIMIBE 10 MG PO TABS
10.0000 mg | ORAL_TABLET | Freq: Every day | ORAL | 1 refills | Status: DC
  Filled 2024-01-13 – 2024-02-12 (×2): qty 30, 30d supply, fill #0

## 2024-01-13 MED ORDER — MAGNESIUM SULFATE 2 GM/50ML IV SOLN
2.0000 g | Freq: Once | INTRAVENOUS | Status: AC
  Administered 2024-01-13: 2 g via INTRAVENOUS
  Filled 2024-01-13: qty 50

## 2024-01-13 NOTE — TOC Transition Note (Signed)
 Transition of Care [facility]) - Discharge Note Donn Pierini RN, BSN Transitions of Care Unit 4E- RN Case Manager See Treatment Team for direct phone #   Patient Details  Name: [name] MRN: [id number] Date of Birth: 1966/05/10  Transition of Care [facility]) CM/SW Contact:  Darrold Span, RN Phone Number: 01/13/2024, 3:26 PM   Clinical Narrative:    Pt stable for transition home today, Orders placed for HHPT/SLP.   CM in to speak with pt and offer [facility] choice Per CMS guidelines from [email or web address] website with star ratings (copy placed in shadow chart)- per pt she has had HH in past- can not remember name- per review in [facility]- pt used [facility]- pt confirmed.  Per pt she would like to use [facility] again if possible, if not states no other preference for provider.   Pt reports she has needed DME, has transportation home.   Address, phone # ( [phone]) PCP confirmed w/ pt.   Call made to [facility] for [facility] needs- per liaison they no longer have a contract with insurance - unable to accept.   Call made to [facility] liaison- referral has been accepted- with anticipated soc for tomorrow- they will contact pt to schedule.   No further TOC needs noted.    Final next level of care: Home w Home Health Services Barriers to Discharge: No Barriers Identified   Patient Goals and CMS Choice Patient states their goals for this hospitalization and ongoing recovery are:: return home CMS Medicare.gov Compare Post Acute Care list provided to:: Patient Choice offered to / list presented to : Patient      Discharge Placement               Home w/ [facility]        Discharge Plan and Services Additional resources added to the After Visit Summary for     Discharge Planning Services: CM Consult Post Acute Care Choice: Home Health          DME Arranged: N/A DME Agency: NA       HH Arranged: PT, Speech Therapy HH Agency: [facility] Date HH Agency  Contacted: 01/13/24 Time HH Agency Contacted: 1525 Representative spoke with at [facility] Agency: Peter Congo  Social Drivers of Health (SDOH) Interventions SDOH Screenings   Food Insecurity: Food Insecurity Present (01/12/2024)  Housing: High Risk (01/12/2024)  Transportation Needs: Unmet Transportation Needs (01/12/2024)  Utilities: At Risk (01/12/2024)  Depression (PHQ2-9): High Risk (03/11/2023)  Financial Resource Strain: High Risk (09/21/2023)  Stress: Stress Concern Present (09/21/2023)  Tobacco Use: Low Risk  (01/09/2024)     Readmission Risk Interventions    01/13/2024    3:26 PM  Readmission Risk Prevention Plan  Medication Screening Complete  Transportation Screening Complete

## 2024-01-13 NOTE — Progress Notes (Signed)
 01/13/24 1000  Spiritual Encounters  Type of Visit Follow up  Care provided to: Patient  Referral source Patient request  Reason for visit Advance directives  OnCall Visit No   Chaplain responded to a consult request for Advance Directive education. Pt received the education and shared that she would like to notarize the document today.  Chaplain provided the Advance Directive packet as well as education on Advance Directives-documents an individual completes to communicate their health care directions in advance of a time when they may need them. Chaplain informed pt the documents which may be completed here in the hospital are the Living Will and Health Care Power of [place].  Chaplain informed that the Health Care Power of [place] Friends is a legal document in which an individual names another person, their Health Care Agent, to make health care decisions when the individual is not able to make them for themselves. The Health Care Agent's function can be temporary or permanent depending on the pt's ability to make and communicate those decisions independently. Chaplain informed pt in the absence of a Health Care Power of [place], the state of [place] directs health care providers to look to the following individuals in the order listed: legal guardian; an attorney?in?fact under a general power of attorney (POA) if that POA includes the right to make health care decisions; a husband or wife; a majority of parents and adult children; a majority of adult brothers and sisters; or an individual who has an established relationship with you, who is acting in good faith and who can convey your wishes.  If none of these person are available or willing to make medical decisions on a patient's behalf, the law allows the patient's doctor to make decisions for them as long as another doctor agrees with those decisions.  Chaplain also informed the patient that the Health Care agent has no decision-making  authority over any affairs other than those related to his or her medical care.  The chaplain further educated the pt that a Living Will is a legal document that allows an individual to state his or her desire not to receive life-prolonging measures in the event that they have a condition that is incurable and will result in their death in a short period of time; they are unconscious, and doctors are confident that they will not regain consciousness; and/or they have advanced dementia or other substantial and irreversible loss of mental function. The chaplain informed pt that life-prolonging measures are medical treatments that would only serve to postpone death, including breathing machines, kidney dialysis, antibiotics, artificial nutrition and hydration (tube feeding), and similar forms of treatment and that if an individual is able to express their wishes, they may also make them known without the use of a Living Will, but in the event that an individual is not able to express their wishes themselves, a Living Will allows medical providers and the pt's family and friends ensure that they are not making decisions on the pt's behalf, but rather serving as the pt's voice to convey decisions the pt has already made.  The patient is aware that the decision to create an advance directive is theirs alone and they may chose not to complete the documents or may chose to complete one portion or both.  The patient was informed that they can revoke the documents at any time by striking through them and writing void or by completing new documents, but that it is also advisable that the individual verbally  notify interested parties that their wishes have changed.  They are also aware that the document must be signed in the presence of a notary public and two witnesses and that this can be done while the patient is still admitted to the hospital or after discharge in the community. If they decide to complete Advance  Directives after being discharged from the hospital, they have been advised to notify all interested parties and to provide those documents to their physicians and loved ones in addition to bringing them to the hospital in the event of another hospitalization.  The chaplain informed the pt that if they desire to proceed with completing Advance Directive Documentation while they are still admitted, notary services are typically available at [facility] between the hours of 1:00 and 3:30 Monday-Thursday.    When the patient is ready to have these documents completed, the patient should request that their nurse place a spiritual care consult and indicate that the patient is ready to have their advance directives notarized so that arrangements for witnesses and notary public can be made.  Please page spiritual care if the patient desires further education or has questions.    M.Kubra Delano Metz Resident [phone]
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
## 2024-01-13 NOTE — Progress Notes (Signed)
 Physical Therapy Treatment Patient Details Name: [name] MRN: [id number] DOB: June 18, 1966 Today's Date: 01/13/2024   History of Present Illness 59 y.o. female who is being seen 01/10/2024 for the evaluation of NSTEMI/chest pain. +HTN urgency; 3/10 cardiac cath via R radial artery; cardiothoracic surgery consult and pt deemed not a surgical candidate; post-cath code Stroke due to AMS, MRI brain acute to subacute infarcts seen in the right cerebellum, L occipital and R parietal cortex  PMH- CAD s/p PCI c/b CVA, hx DVT, T2DM, HTN, HLD, Sarcoidosis, chronic pain    PT Comments  Patient alert and able to safely ambulate with RW x 80 ft. Noted she is potentially having another procedure today. Will continue to follow to insure she is able to return home and manage 1 flight of steps.     If plan is discharge home, recommend the following: A [place] help with bathing/dressing/bathroom;Assistance with cooking/housework;Direct supervision/assist for medications management;Direct supervision/assist for financial management;Assist for transportation;Help with stairs or ramp for entrance   Can travel by private vehicle        Equipment Recommendations  None recommended by PT    Recommendations for Other Services       Precautions / Restrictions Precautions Precautions: Fall;Other (comment) Recall of Precautions/Restrictions: Impaired Precaution/Restrictions Comments: R radial cath 3/10 Restrictions Weight Bearing Restrictions Per Provider Order: No     Mobility  Bed Mobility               General bed mobility comments: sitting EOB on arrival    Transfers Overall transfer level: Needs assistance Equipment used: Rolling walker (2 wheels) Transfers: Sit to/from Stand Sit to Stand: Contact guard assist           General transfer comment: from EOB; cues for safe use of RW    Ambulation/Gait Ambulation/Gait assistance: Contact guard assist Gait Distance (Feet): 80  Feet Assistive device: Rolling walker (2 wheels) Gait Pattern/deviations: Step-through pattern, Decreased stride length   Gait velocity interpretation: >2.62 ft/sec, indicative of community ambulatory   General Gait Details: good cadence and use of RW   Stairs             Wheelchair Mobility     Tilt Bed    Modified Rankin (Stroke Patients Only) Modified Rankin (Stroke Patients Only) Pre-Morbid Rankin Score: Moderate disability Modified Rankin: Moderately severe disability     Balance Overall balance assessment: Mild deficits observed, not formally tested                                          Communication Communication Communication: No apparent difficulties  Cognition Arousal: Alert Behavior During Therapy: Flat affect   PT - Cognitive impairments: No family/caregiver present to determine baseline, Memory, Orientation   Orientation impairments: Time                   PT - Cognition Comments: Does not recall PT from yesterday (was groggy/lethargic) but thinks she knows me from when she was at [employer] (I don't work for [employer]) Following commands: Impaired Following commands impaired: Follows one step commands with increased time    Cueing Cueing Techniques: Verbal cues  Exercises      General Comments General comments (skin integrity, edema, etc.): on RA with sats 95%      Pertinent Vitals/Pain Pain Assessment Pain Assessment: No/denies pain    Home Living  Prior Function            PT Goals (current goals can now be found in the care plan section) Acute Rehab PT Goals Patient Stated Goal: wants to go home Time For Goal Achievement: 01/26/24 Potential to Achieve Goals: Good Progress towards PT goals: Progressing toward goals    Frequency    Min 3X/week      PT Plan      Co-evaluation              AM-PAC PT "6 Clicks" Mobility   Outcome Measure  Help needed  turning from your back to your side while in a flat bed without using bedrails?: None Help needed moving from lying on your back to sitting on the side of a flat bed without using bedrails?: A Snook Help needed moving to and from a bed to a chair (including a wheelchair)?: A Henzler Help needed standing up from a chair using your arms (e.g., wheelchair or bedside chair)?: A Pen Help needed to walk in hospital room?: A Grunden Help needed climbing 3-5 steps with a railing? : A Metzner 6 Click Score: 19    End of Session Equipment Utilized During Treatment: Gait belt Activity Tolerance: Patient tolerated treatment well Patient left: with call bell/phone within reach;in chair;with chair alarm set Nurse Communication: Mobility status PT Visit Diagnosis: Other abnormalities of gait and mobility (R26.89);Hemiplegia and hemiparesis Hemiplegia - Right/Left: Left Hemiplegia - dominant/non-dominant: Non-dominant Hemiplegia - caused by: Cerebral infarction     Time: 0755-0810 PT Time Calculation (min) (ACUTE ONLY): 15 min  Charges:    $Gait Training: 8-22 mins PT General Charges $$ ACUTE PT VISIT: 1 Visit                      Jerolyn Center, PT Acute Rehabilitation Services  Office [phone]    Zena Amos 01/13/2024, 8:22 AM

## 2024-01-13 NOTE — Progress Notes (Signed)
 PROGRESS NOTE  [name]:[id number] DOB: 1966/01/25   PCP: Hoy Register, MD  Patient is from: Home.  DOA: 01/09/2024 LOS: 3  Chief complaints Chief Complaint  Patient presents with   Chest Pain     Brief Narrative / Interim history:  58 y.o. F with PMH of CAD s/p PCI's, CVA with left hemiparesis, DVT, DM-2, sarcoidosis, HTN, HLD, anxiety, depression and chronic pain presented to the hospital with progressive chest pain and dyspnea for about 2 weeks and admitted by cardiology team with non-STEMI and hypertensive urgency on 3/9.  Patient had LHC that showed multivessel CAD on 3/10.  While in recovery, patient had decreased LOC, disoriented, left facial droop, right leg weakness and global aphasia.  Code stroke activated.  NIHSS score was 9.  She was last known normal about 9 AM.  CT head and CT angio head and neck without acute finding.  MRI brain ordered.  Hospitalist service called for assistance.  The next day, encephalopathy resolved.  MRI showed 3 punctate acute to subacute infarcts in the right cerebellum and bilateral cerebral cortex and chronic small vessel ischemia.  Neurology recommended continuing Plavix.  Patient is allergic to aspirin.  He is already on a statin.   Subjective: Seen and examined earlier this morning.  No major events overnight of this morning.  Reports intermittent bilateral chest/subcostal pain that she describes as sharp.  No provoking or elevating factor.  She says it last about 10 minutes and resolves on its own.  Denies shortness of breath.  Denies new focal neurosymptoms other than chronic left sided weakness.  Objective: Vitals:   01/13/24 0753 01/13/24 1023 01/13/24 1105 01/13/24 1231  BP: 128/80 (!) 117/58 99/83 116/79  Pulse: 87 70 78 83  Resp: 11  13   Temp: 97.6 F (36.4 C)  98.3 F (36.8 C) 98.8 F (37.1 C)  TempSrc: Oral  Oral Oral  SpO2: 97%  100%   Weight:      Height:        Examination:  GENERAL: No apparent distress.   Nontoxic. HEENT: MMM.  Vision and hearing grossly intact.  NECK: Supple.  No apparent JVD.  RESP:  No IWOB.  Fair aeration bilaterally. CVS:  RRR. Heart sounds normal.  ABD/GI/GU: BS+. Abd soft, NTND.  MSK/EXT:  Moves extremities. No apparent deformity. No edema.  SKIN: no apparent skin lesion or wound NEURO: Awake, alert and oriented appropriately.  Speech clear.  No facial asymmetry.  PERRL.  Motor 2+/5 in LLE and 3+/5 in LUE.  Sensation intact.  Motor deficit chronic per patient. PSYCH: Calm. Normal affect.   Procedures:  3/10-left heart catheterization showed multivessel CAD  Microbiology summarized: None  Assessment and plan: Non-STEMI/multivessel CAD-presented with progressive dyspnea and chest pain for 2 weeks.  Serial troponin negative.  EKG without acute ischemic finding.  LHC with multivessel CAD. -Defer to cardiology. -Cardiothoracic surgery consulted for CABG -Okay to resume antiplatelet/anticoagulation and other cardiac medications   Strokelike symptoms/delirium-likely iatrogenic from fentanyl and Versed she received during LHC.  B12, TSH, RPR, VBG, CT head and CT angio head and neck unrevealing.  MRI showed 3 punctate acute to subacute infarct in right cerebellum and bilateral cerebral cortex.  I do not think MRI MRI finding would explain patient's symptoms.  Ammonia slightly elevated but no history of liver cirrhosis.  Delirium/stroke symptoms resolved.  She is currently awake and alert and oriented x 4. -Delirium precaution.  Acute on chronic CVA: Patient with chronic left hemiparesis.  MRI findings as above.  Her neurosymptoms are more of delirium than acute stroke since they resolved quickly.   CT head and CT angio head and neck unrevealing. -Neurology recommended continuing Plavix and statin.  Allergic to aspirin. -Continue PT/OT  Elevated ammonia: No history of liver cirrhosis.  I do not see medication contributing either.  Encephalopathy resolved. -Lactulose as  needed.   Poorly controlled IDDM-2 with hyperglycemia and hyperlipidemia: A1c 12.3%.  LDL 135.  Seems to be on Lantus 25 units twice daily and glipizide at home.   Recent Labs  Lab 01/12/24 1229 01/12/24 1657 01/12/24 2205 01/13/24 0640 01/13/24 1105  GLUCAP 297* 181* 167* 192* 252*  -Increase Lantus to 35 units twice daily -Discontinue glipizide due to risk of hypoglycemia -Needs close follow-up with PCP.  May benefit from oral agents but would avoid sulfonylurea due to risk of hypoglycemia.   Hypertensive urgency: Normotensive this morning. -Continue current cardiac medications per primary team   Anxiety and depression: Unable to assess due to delirium. -Continue home Cymbalta.  Obesity Body mass index is 34.74 kg/m. -Encourage lifestyle change to lose weight          DVT prophylaxis:  Defer to primary.  Okay to resume anticoagulation or VTE prophylaxis  Code Status: Full code Family Communication: None at bedside Level of care: Telemetry Cardiac Status is: Inpatient   Final disposition: Per primary team.  No barrier from medical standpoint if MRI brain negative Consultants:  [facility] hospitalist Cardiothoracic surgery Neurology  55 minutes with more than 50% spent in reviewing records, counseling patient/family and coordinating care.   Sch Meds:  Scheduled Meds:  carvedilol  3.125 mg Oral BID WC   DULoxetine  60 mg Oral Daily   ezetimibe  10 mg Oral Daily   famotidine  20 mg Oral BID   furosemide  40 mg Oral BID   insulin aspart  0-15 Units Subcutaneous TID WC   insulin aspart  0-5 Units Subcutaneous QHS   insulin aspart  5 Units Subcutaneous TID WC   insulin glargine  28 Units Subcutaneous BID   isosorbide mononitrate  60 mg Oral Daily   lactulose  20 g Oral Once   rosuvastatin  40 mg Oral Daily   ticagrelor  90 mg Oral BID   Continuous Infusions:  nitroGLYCERIN Stopped (01/11/24 0939)   PRN Meds:.acetaminophen, lactulose **FOLLOWED BY** lactulose,  ondansetron (ZOFRAN) IV  Antimicrobials: Anti-infectives (From admission, onward)    None        I have personally reviewed the following labs and images: CBC: Recent Labs  Lab 01/09/24 2331 01/10/24 0446 01/11/24 0530 01/12/24 0731 01/13/24 0305  WBC 7.4 6.6 4.2 8.0 5.9  NEUTROABS  --  4.1 2.1 5.5 3.2  HGB 13.6 14.6 13.6 13.5 13.2  HCT 39.7 43.1 40.2 39.2 38.9  MCV 97.8 98.9 99.0 97.8 98.7  PLT 201 211 173 209 183   BMP &GFR Recent Labs  Lab 01/09/24 2331 01/10/24 0446 01/11/24 0530 01/12/24 0731 01/13/24 0305 01/13/24 0838  NA 139 139 136 137  --  136  K 3.8 4.1 3.9 3.8  --  3.6  CL 103 105 105 104  --  102  CO2 26 24 23 27   --  27  GLUCOSE 422* 378* 281* 186*  --  221*  BUN 13 16 13 7   --  13  CREATININE 0.80 0.86 0.78 0.83  --  1.09*  CALCIUM 9.1 9.3 8.4* 8.6*  --  8.5*  MG  --  2.0 1.8 1.8 1.8  --    Estimated Creatinine Clearance: 63.3 mL/min (A) (by C-G formula based on SCr of 1.09 mg/dL (H)). Liver & Pancreas: No results for input(s): "AST", "ALT", "ALKPHOS", "BILITOT", "PROT", "ALBUMIN" in the last 168 hours. No results for input(s): "LIPASE", "AMYLASE" in the last 168 hours. Recent Labs  Lab 01/11/24 2140  AMMONIA 64*   Diabetic: No results for input(s): "HGBA1C" in the last 72 hours.  Recent Labs  Lab 01/12/24 1229 01/12/24 1657 01/12/24 2205 01/13/24 0640 01/13/24 1105  GLUCAP 297* 181* 167* 192* 252*   Cardiac Enzymes: No results for input(s): "CKTOTAL", "CKMB", "CKMBINDEX", "TROPONINI" in the last 168 hours. No results for input(s): "PROBNP" in the last 8760 hours. Coagulation Profile: Recent Labs  Lab 01/11/24 2140  INR 1.0   Thyroid Function Tests: Recent Labs    01/11/24 2140  TSH 0.498   Lipid Profile: No results for input(s): "CHOL", "HDL", "LDLCALC", "TRIG", "CHOLHDL", "LDLDIRECT" in the last 72 hours.  Anemia Panel: Recent Labs    01/11/24 2140  VITAMINB12 407   Urine analysis:    Component Value  Date/Time   COLORURINE STRAW (A) 11/12/2022 2312   APPEARANCEUR CLEAR 11/12/2022 2312   APPEARANCEUR Clear 09/01/2022 1130   LABSPEC 1.029 11/12/2022 2312   PHURINE 6.0 11/12/2022 2312   GLUCOSEU >=500 (A) 11/12/2022 2312   HGBUR NEGATIVE 11/12/2022 2312   BILIRUBINUR NEGATIVE 11/12/2022 2312   BILIRUBINUR Negative 09/01/2022 1130   KETONESUR 20 (A) 11/12/2022 2312   PROTEINUR NEGATIVE 11/12/2022 2312   UROBILINOGEN 0.2 03/16/2021 1640   NITRITE NEGATIVE 11/12/2022 2312   LEUKOCYTESUR NEGATIVE 11/12/2022 2312   Sepsis Labs: Invalid input(s): "PROCALCITONIN", "LACTICIDVEN"  Microbiology: No results found for this or any previous visit (from the past 240 hours).  Radiology Studies: No results found.     Obert Espindola T. Jaleiyah Alas [facility]  If 7PM-7AM, please contact night-coverage [email or web address] 01/13/2024, 1:08 PM

## 2024-01-13 NOTE — Progress Notes (Signed)
 STROKE TEAM PROGRESS NOTE    SIGNIFICANT HOSPITAL EVENTS 3/9-patient admitted with plans for cardiac cath 3/10-patient underwent cardiac cath and developed acute onset confusion with aphasia, left facial droop and right lower extremity weakness during procedure  INTERIM HISTORY/SUBJECTIVE Patient feels her confusion and disorientation has improved but she is not back to her baseline. Patient has remained hemodynamically stable and afebrile overnight.     OBJECTIVE  CBC    Component Value Date/Time   WBC 5.9 01/13/2024 0305   RBC 3.94 01/13/2024 0305   HGB 13.2 01/13/2024 0305   HGB 14.8 07/03/2023 1005   HCT 38.9 01/13/2024 0305   HCT 44.1 07/03/2023 1005   PLT 183 01/13/2024 0305   PLT 255 07/03/2023 1005   MCV 98.7 01/13/2024 0305   MCV 97 07/03/2023 1005   MCH 33.5 01/13/2024 0305   MCHC 33.9 01/13/2024 0305   RDW 12.9 01/13/2024 0305   RDW 12.3 07/03/2023 1005   LYMPHSABS 2.0 01/13/2024 0305   LYMPHSABS 1.7 10/08/2022 1035   MONOABS 0.5 01/13/2024 0305   EOSABS 0.1 01/13/2024 0305   EOSABS 0.1 10/08/2022 1035   BASOSABS 0.1 01/13/2024 0305   BASOSABS 0.0 10/08/2022 1035    BMET    Component Value Date/Time   NA 136 01/13/2024 0838   NA 137 07/03/2023 1005   K 3.6 01/13/2024 0838   CL 102 01/13/2024 0838   CO2 27 01/13/2024 0838   GLUCOSE 221 (H) 01/13/2024 0838   BUN 13 01/13/2024 0838   BUN 11 07/03/2023 1005   CREATININE 1.09 (H) 01/13/2024 0838   CALCIUM 8.5 (L) 01/13/2024 0838   EGFR 98 07/03/2023 1005   GFRNONAA 59 (L) 01/13/2024 0838    IMAGING past 24 hours No results found.   Vitals:   01/13/24 0753 01/13/24 1023 01/13/24 1105 01/13/24 1231  BP: 128/80 (!) 117/58 99/83 116/79  Pulse: 87 70 78 83  Resp: 11  13 15   Temp: 97.6 F (36.4 C)  98.3 F (36.8 C) 98.8 F (37.1 C)  TempSrc: Oral  Oral Oral  SpO2: 97%  100%   Weight:      Height:         PHYSICAL EXAM General:  Alert, well-nourished, well-developed patient in no acute  distress Psych:  Mood and affect appropriate for situation CV: Regular rate and rhythm on monitor Respiratory:  Regular, unlabored respirations on room air   NEURO:  Mental Status: AA&Ox3, patient is able to give clear and coherent history Speech/Language: speech is without dysarthria or aphasia.    Cranial Nerves:  II: PERRL.  ? Right hemianopsia as inconsistent in her responses on the right III, IV, VI: EOMI. Eyelids elevate symmetrically.  VII: Face is symmetrical resting and smiling VIII: hearing intact to voice. IX, X: Phonation is normal.  XII: tongue is midline without fasciculations. Motor: 5/5 strength to all muscle groups tested, left leg slightly weaker than right.  Tone: is normal and bulk is normal Sensation- Intact to light touch bilaterally but diminished in the left leg Coordination: FTN intact bilaterally Gait- deferred  Most Recent NIH  1a Level of Conscious.: 0 1b LOC Questions: 0 1c LOC Commands: 0 2 Best Gaze: 0 3 Visual: 1 4 Facial Palsy: 0 5a Motor Arm - left: 0 5b Motor Arm - Right: 0 6a Motor Leg - Left: 0 6b Motor Leg - Right: 0 7 Limb Ataxia: 0 8 Sensory:  1 9 Best Language: 0 10 Dysarthria: 0 11 Extinct. and Inatten.: 0 TOTAL:  2   ASSESSMENT/PLAN  [name] is a 58 y.o. female with history of CAD with multiple stents, DVT, diabetes, hypertension, hyperlipidemia and stroke who was originally admitted for cardiac cath in the setting of chest pain.  She underwent this procedure yesterday and during the procedure, she developed aphasia, confusion, left facial droop and right lower extremity weakness.  MRI reveals 3 punctate infarcts in the bilateral cerebral cortex and right cerebellum.  Initial NIH 9  Acute Ischemic Infarct: 3 scattered punctate infarcts Etiology: Periprocedural infarcts in the setting of cardiac cath Code Stroke CT head No acute abnormality. ASPECTS 10.    CTA head & neck no LVO or proximal stenosis, enlarged  heterogenous thyroid gland, recommend outpatient thyroid ultrasound MRI punctate infarcts in bilateral cerebral hemispheres and right cerebellum 2D Echo ejection fraction 70 to 75%.   LDL 135 HgbA1c 12.3 VTE prophylaxis -SCDs clopidogrel 75 mg daily prior to admission, now on clopidogrel 75 mg daily unable to take aspirin due to allergy Therapy recommendations:  Pending Disposition: Pending, likely home  Hx of Stroke/TIA Patient has history of stroke with residual left leg weakness  Hypertension Home meds: Losartan 50 mg daily Stable Blood Pressure Goal: BP less than 220/110   Hyperlipidemia Home meds: Atorvastatin 80 mg daily, resumed in hospital LDL 135, goal < 70 Add ezetimibe 10 mg daily Continue statin at discharge  Diabetes type II Uncontrolled Home meds: Glipizide 10 mg twice daily, Lantus insulin 25 units twice daily HgbA1c 12.3, goal < 7.0 Diabetes coordinator consult CBGs SSI Recommend close follow-up with PCP for better DM control  Multivessel CAD with non-STEMI Patient underwent cardiac cath 3/10 revealing multivessel disease Plan for possible CABG in the future Management per cardiology team  Other Stroke Risk Factors Obesity, Body mass index is 34.74 kg/m., BMI >/= 30 associated with increased stroke risk, recommend weight loss, diet and exercise as appropriate  Coronary artery disease   Other Active Problems None  Hospital day # 3 GI patient developed sudden onset of confusion and facial and extremity weakness following cardiac cath procedure and appears to be improving.  MRI confirmed small embolic infarcts likely periprocedural.  Continue Plavix alone as patient is unable to tolerate aspirin due to allergy.  Aggressive risk factor modification.  Mobilize out of bed.  Therapy consults.   Stroke team will sign off.  Kindly call for questions.    Delia Heady, MD Medical Director [facility] Pager: [phone] 01/13/2024 3:51 PM   To  contact Stroke Continuity provider, please refer to [email or web address]. After hours, contact General Neurology

## 2024-01-13 NOTE — Progress Notes (Addendum)
 Patient Name: [name] Date of Encounter: 01/13/2024 [facility] Cardiologist: Finerty Ishikawa, MD   Patient Summary  .    58 y/o woman with [place] CAD-PCI in setting of NSTEMI, HTN, DM-2 (on insuling) & HLD (poorly controlled) & prior CVA who presented early Am 3/9 with elevated BPs (SBPs in 180-190s) & Chest Pain concerning for ACS.    Interval Summary  .    Feeling much better today.  Actually walked in the hallway with physical therapy and did well.  No further chest pain.  Seems much clearer from mental status.  Assessment & Plan .   Principal Problem:   Acute coronary syndrome (HCC) Active Problems:   CAD S/P percutaneous coronary angioplasty   Stroke (cerebrum) (HCC)   Coronary artery disease involving native coronary artery of native heart with unstable angina pectoris (HCC)   Essential hypertension, benign   Uncontrolled type 2 diabetes mellitus with hyperglycemia, with long-term current use of insulin (HCC)   H/o NSTEMI (non-ST elevated myocardial infarction) (HCC)   Hyperlipidemia associated with type 2 diabetes mellitus (HCC)   Hemiparesis affecting left side as late effect of cerebrovascular accident (HCC)   Hypertensive heart disease with heart failure (HCC)   Principal Problem:   Acute coronary syndrome (HCC) / CAD S/P percutaneous coronary angioplasty /   Coronary artery disease involving native coronary artery of native heart with unstable angina pectoris (HCC)    Essential hypertension, benign /Hyperlipidemia associated with type 2 diabetes mellitus (HCC)    Uncontrolled type 2 diabetes mellitus with hyperglycemia, with long-term current use of insulin (HCC) Presented with chest pain concerning for progressive angina with troponin elevations felt to be ACS/unstable angina Cardiac cath showed ostial LM disease as well as small caliber ostial D1 and moderate disease in the LCx and LAD as well as occluded PDA stent => however post cath  complication of punctate CVA x 3 with significant delirium and AMS.  Now resolved.   Started on Imdur 60 mg daily. Her home dose of beta-blocker and ARB have been held to allow for therapeutic permissive hypertension.  Will restart carvedilol 3.125 mg twice daily today and in close follow-up can consider restarting ARB versus titrating up beta-blocker. As she is unlikely to be a CABG candidate, with his recent stroke we had restarted Plavix, but with plan for proximal left main stent we will convert to Brilinta today and discharge home on Brilinta Continue Zetia 10 mg daily and converted from atorvastatin 80 mg (that she was not taking due to pill size) to rosuvastatin 40 mg  I had another long talk today with the patient and with her daughter and son on the telephone about plan of care.  Had reviewed the images with Drs. Excell Seltzer, [place] and [place] who all agree that now with a second stroke her best option would be left main PCI especially in light of being turned down for surgery by CVTS already.  The decision was whether or not we do left main PCI prior to discharge or let her go home to allow for time to recover from her periprocedural stroke.  Yesterday it appeared that she was having significant exertional angina with minimal activity, but today seems to be doing better and was able to walk in the hallway without symptoms.  She would like to go home.  And from a true safety standpoint the best option would be for her to go home for a couple weeks to recover from the stroke  and this procedure.  We will plan to have her seen back in 1 to 2 weeks to reassess her blood pressure and likely titrate medications.  We will plan for left main PCI in roughly 3 weeks with Dr. Excell Seltzer with the plan to go femoral access, no sedation and closure post PCI.  She would probably stay overnight based on her being left main PCI.  Home medications for BP were carvedilol 25 mg BID & Losartan 50 mg daily => interestingly, her  pressures have been somewhat labile and if anything a bit low on no medications as were held postprocedural for permissive hypertension.  She came in with almost hypertensive urgency. => Will gradually restart carvedilol at 3.125 mg twice daily today and then restart losartan at 25 mg starting tomorrow. => She will need to be seen in 1 to 2 weeks in order to titrate up medications DM-2 (poorly controlled with A1c over 12):  appreciate TRH assistance. =>  Will need to determine appropriate dosing for her long-acting insulin prior to discharge, (had been on 25 units twice daily and glipizide at home, may need higher dose-potentially increase Lantus to 30 units),  also consider SGLT 2 inhibitor.   Would also potentially benefit from GLP-1 agonist in the outpatient setting. HLD: Will convert from atorvastatin to rosuvastatin 40 mg to see if she can take the pill.  Acute Periprocedural Stroke and delirium:3 small punctate lesion seen on brain MRI.  Initially, these are isolated in the same location as her prior stroke and been.  Appreciate neurology and internal medicine consultation and follow-up Treating risk factors as for CAD Restarting Plavix Temporarily allowing for brain pressure to be elevated after permissive hypertension provided her chest pain is stable.  Hypertensive Heart Disease with CHF: Hyperdynamic LV function with indeterminate filling pressures, however severely elevated LVEDP on cath Will reinitiate low-dose carvedilol today at 3.25 mg twice daily and losartan 25 mg daily tomorrow.  Anticipate gradually titrating up over the next several weeks back to her original home dose => will need close TOC follow-up Continue home dose of furosemide 40 mg twice daily.  Chronic Conditions #Chronic pain: Holding gabapentin right now based on confusion => can likely restart on discharge. #Anxiety/depression: Continue duloxetine 60 mg daily #GERD: Continue famotidine 20 mg twice daily   Dispo:  Based on discussion with the patient and family as well as heart team, the best course of action if she is asymptomatic as to discharge home with close follow-up, gradually titrating back her medications this will allow time for her to recover from her stroke prior to planning staged PCI with Dr. Excell Seltzer. => Will potentially plan for April 1.  Vital Signs .    Vitals:   01/12/24 1657 01/13/24 0006 01/13/24 0400 01/13/24 0753  BP: (!) 109/52 114/78 125/74 128/80  Pulse: 71 79 65 87  Resp: 17 17  11   Temp: 98.8 F (37.1 C) 98.3 F (36.8 C) 98 F (36.7 C) 97.6 F (36.4 C)  TempSrc: Axillary Oral Oral Oral  SpO2: 91% 90% 92% 97%  Weight:      Height:        Intake/Output Summary (Last 24 hours) at 01/13/2024 0939 Last data filed at 01/12/2024 1100 Gross per 24 hour  Intake 300 ml  Output --  Net 300 ml      01/11/2024    2:50 PM 01/09/2024   10:37 PM 09/16/2023    3:56 PM  Last 3 Weights  Weight (lbs) 208 lb  12.4 oz 216 lb 4.3 oz 216 lb 3.2 oz  Weight (kg) 94.7 kg 98.1 kg 98.068 kg      Lab Results  Component Value Date   NA 136 01/13/2024   CL 102 01/13/2024   K 3.6 01/13/2024   CO2 27 01/13/2024   BUN 13 01/13/2024   CREATININE 1.09 (H) 01/13/2024   GFRNONAA 59 (L) 01/13/2024   CALCIUM 8.5 (L) 01/13/2024   PHOS 3.6 03/30/2022   ALBUMIN 3.2 (L) 11/12/2022   GLUCOSE 221 (H) 01/13/2024   Lab Results  Component Value Date   HGBA1C 12.3 (H) 01/10/2024   Lab Results  Component Value Date   CHOL 249 (H) 01/10/2024   HDL 42 01/10/2024   LDLCALC 135 (H) 01/10/2024   TRIG 361 (H) 01/10/2024   CHOLHDL 5.9 01/10/2024   Telemetry/ECG/Cardiac Studies    NSR - Personally Reviewed  No New EKG today. EKG from yesterday (01/11/2024: Sinus rhythm, rate 60 bpm.  Borderline LVH criteria.  No significant other findings besides artifact.  Cardiac Cath 01/11/2024: Significant MV CAD-ostial, eccentric 70% LM, 80% ostial small D2, long segment of mid LAD up to 50%.  60% proximal LCx  and CTO of overlapping stents in the PDA.  Widely patent LCx stent.  EF.  LVEDP 35 mmHg.--Noted difficulty with radial access! => Recommendation was to consider CABG versus left main PCI.  Increased diuretic with 40 mg IV twice daily  Echo 01/12/2024: Hyperdynamic LV with EF of 70 to 75%.  No regional wall motion normality.  Mild concentric LVH.  Normal PAP.  Possible liver cyst.  Radiology:: CT Head with/without Contrast 01/11/2024: No acute findings, mild to minimal small vessel change.  CTA showed no large vessel occlusion or proximal stenosis.  Bilateral carotid bifurcation atherosclerosis enlarged thyroid MRI brain 01/12/2024:3 punctate acute/subacute infarcts seen in the right cerebellum and bilateral cerebral cortex.  Chronic small vessel disease.  Motion artifact noted.   Physical Exam .   GEN: No acute distress.  Obese; much more awake and alert.  Clear/coherent speech. Neck: No JVD or bruit Cardiac: RRR with distant S1 and S2.  No M/R/G. Respiratory: CTAB, nonlabored and good air movement. GI: Soft/NT/ND STEMI BS.  No HSM.  Obese. MS: No edema Neuro: Stable left leg and arm weakness and numbness.  No facial droop.   For questions or updates, please contact [facility] Please consult [email or web address] for contact info under   I spent 47 minutes in the care of Dayonna Selbe Freund today including reviewing labs (2 minutes), reviewing studies (2 minutes including reviewing of the CT scans, MRI brain, echo as well as reviewing cardiac cath films with Dr. Excell Seltzer), face to face time discussing treatment options (including telephone call with patient's family 25 minutes), reviewing records from neurology and [facility] consultation notes (5 minutes), 13 minutes dictating, and documenting in the encounter.        Signed, Bryan Lemma, MD

## 2024-01-13 NOTE — Telephone Encounter (Signed)
 Patient Product/process development scientist completed.    The patient is insured through [employer] and [employer].     Ran test claim for Brilinta 90 mg and the current 30 day co-pay is $4.00.   This test claim was processed through [facility]- copay amounts may vary at other pharmacies due to pharmacy/plan contracts, or as the patient moves through the different stages of their insurance plan.     [name], CPHT Pharmacy Technician III Certified Patient Advocate [facility] Pharmacy Patient Advocate Team Direct Number: [phone]  Fax: [phone]

## 2024-01-13 NOTE — Progress Notes (Signed)
 Patient given discharge instructions medication list and follow up appointments. IV and tele were dcd. Will discharge home as ordered. Ewell Benassi, Randall An RN

## 2024-01-13 NOTE — Discharge Summary (Addendum)
 Discharge Summary    Patient ID: SHAHD OCCHIPINTI MRN: [id number]; DOB: 1965/12/01  Admit date: 01/09/2024 Discharge date: 01/13/2024  PCP:  Hoy Register, MD   [facility] Providers Cardiologist:  Wieser Ishikawa, MD  Cardiology APP:  Manson Passey, [place] (Inactive)       Discharge Diagnoses    Principal Problem:   Acute coronary syndrome [facility]) Active Problems:   Essential hypertension, benign   Uncontrolled type 2 diabetes mellitus with hyperglycemia, with long-term current use of insulin (HCC)   H/o NSTEMI (non-ST elevated myocardial infarction) (HCC)   CAD S/P percutaneous coronary angioplasty   Hyperlipidemia associated with type 2 diabetes mellitus (HCC)   Stroke (cerebrum) (HCC)   Hemiparesis affecting left side as late effect of cerebrovascular accident [facility])   Coronary artery disease involving native coronary artery of native heart with unstable angina pectoris (HCC)   Hypertensive heart disease with heart failure [facility])   Diagnostic Studies/Procedures   Cardiac Cath 01/11/2024: Significant MV CAD-ostial, eccentric 70% LM, 80% ostial small D2, long segment of mid LAD up to 50%.  60% proximal LCx and CTO of overlapping stents in the PDA.  Widely patent LCx stent.  EF.  LVEDP 35 mmHg.--Noted difficulty with radial access! => Recommendation was to consider CABG versus left main PCI.  Increased diuretic with 40 mg IV twice daily  Echo 01/12/2024: Hyperdynamic LV with EF of 70 to 75%.  No regional wall motion normality.  Mild concentric LVH.  Normal PAP.  Possible liver cyst.   Radiology:: CT Head with/without Contrast 01/11/2024: No acute findings, mild to minimal small vessel change.  CTA showed no large vessel occlusion or proximal stenosis.  Bilateral carotid bifurcation atherosclerosis enlarged thyroid MRI brain 01/12/2024:3 punctate acute/subacute infarcts seen in the right cerebellum and bilateral cerebral cortex.  Chronic small vessel disease.  Motion  artifact noted. _____________   History of Present Illness     [name] is a 58 y.o. 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 Patient presented to the ED with progressive exertional dyspnea/exertional limitation/anginal chest pain with symptoms concerning for acute coronary syndrome and trivial troponin elevation.  Initially presented hypertensive and blood pressures in the 180s to 190s concerning for potential accelerated hypertension/hypertensive urgency. => She had 2 weeks of progressive dyspnea and chest pain on exertion able to only walk very 50 feet with her walker before developing symptoms.  On arrival had 7-8/10 chest pain and had a steady aching discomfort despite being on nitroglycerin infusion. On the morning prior to admission she had severe left-sided chest pain radiating to the left breast with dyspnea and diaphoresis prompting trip to the ER.  Somewhat improvement to nitroglycerin.  Hospital Course     Consultants: neurology, [facility] 3  Patient underwent cardiac catheterization on the following morning after admission, unfortunately was found to have left main disease but near the end the procedure she developed a headache and subsequently in the holding area had notable altered mental status with agitation and confusion that led to code stroke being called leading to head CT with CTA head there were both not revealing.  She required sedation for the study but results as well as for MRI which showed small, focal 3 punctate lesions however with significant altered ental status and somnolence and agitation out of proportion to extent of findings on the MRI.  The  following day she was awake and alert and arousable with no significant residual symptoms.   ACS/CAD with unstable angina Hyperlipidemia Patient previously presented  for an NSTEMI in 10/2019 to [facility]; cath demonstrated severe stenosis of Lcx and PDA with moderate stenosis of D2 s/p DES to PDA and mid-LCX. She subsequently continued to have intermittent chest pain that worsened on exertion and improved with nitroglycerin. She represented with chest pain on 05/05/2020; left prior to complete evaluation. Lexiscan on 06/15/20 was normal. TTE on 07/02/20 demonstrated normal Bi-V function. She had a repeat NSTEMI in 03/2022 with cath demonstrating 99% ISR of PDA stent with residual D2 (80%) lesion due to proximity to her LAD. Her course c/b an acute CVA after cath and she was given TPA. On admission, patient reported 2 weeks of progressive dyspnea and chest pain on exertion; now only able to walk 50 ft with her walker prior to developing symptoms. She also reports episodes waking up in the middle of night with chest pain; she does have some improvement of her chest pain when leaning forward compared to laying flat. She described her chest pain as pressure up to 10/10, no improvement with nitroglycerin. Troponin 18->20. She was started on heparin, imdur. Plans made for LHC given symptoms consistent with unstable angina. Patient underwent LHC as above with Dr. Okey Dupre on 3/10. This found 70% ostial left main disease, 80% Diagonal 2, widely patent left Cirucmflex stent, and chronically occluded overlapping rPDA stents. Patient was seen by Dr. Leafy Ro and given co-morbidities, deemed not a CABG candidate. Initial plan was for inpatient PCI of left main. However, given improvement in exertional symptoms as well peri-procedural CVA, decision made to discharge home with staged PCI in ~3 weeks. Close outpatient follow up arranged Plavix initially restarted but given LM disease and no surgical plans, converted to Brilinta. HLD open goal LDL<55): Continue Crestor 40mg  (switched from Atorvastatin due to chronic/prior to admission difficulty with pill size) and Zetia 10mg .  Continue Imdur  60mg  Continue Coreg 3.125mg  BID => plan to titrate back to home dose in the outpatient regimen as BP tolerates.  Would then add back ARB.Marland Kitchen  Attending Attestation:  I had another long talk today with the patient and with her daughter and son on the telephone about plan of care. Had reviewed the images with Drs. Excell Seltzer, [place] and [place] who all agree that now with a second stroke her best option would be left main PCI especially in light of being turned down for surgery by CVTS already. The decision was whether or not we do left main PCI prior to discharge or let her go home to allow for time to recover from her periprocedural stroke. Yesterday it appeared that she was having significant exertional angina with minimal activity, but today seems to be doing better and was able to walk in the hallway without symptoms. She would like to go home. And from a true safety standpoint the best option would be for her to go home for a couple weeks to recover from the stroke and this procedure. We will plan to have her seen back in 1 to 2 weeks to reassess her blood pressure and likely titrate medications. We will plan for left main PCI in roughly 3 weeks with Dr. Excell Seltzer with the plan to go femoral access, no sedation and closure post PCI. She would probably stay overnight based on her being left main PCI.   Hypertension -> temporarily allowing for permissive hypertension, she was on high-dose carvedilol and  losartan that were held. Patient with profoundly labile BP this admission. Near hypertensive emergency on admission but now with low BP despite holding home medications.  Coreg resumed at lower dose, 3.125 => plan to titrate up in the outpatient setting.   Hold losartan and reassess in outpatient follow up. Restart ARB and follow-up.  Acute periprocedural stroke with delirium During the cath, patient was given nitroglycerin and subsequently developed a headache and altered mental status. In the postop unit code  stroke called because of acute delirium, confusion, agitation, as well as aphasia, left facial droop and right lower extremity weakness. Was given sedation for CT/CTA which did not show any significant acute findings. Neurology saw patient, TNK not given. She was not able to get the MRI done until later on in the evening of 3/10. Throughout the course of the day, she was not following commands and not consistently verbally responsive. Was occasionally combative as well. MRI showed 3 small punctate lesions. Permissive hypertension allowed. By 3/11, aphasia had resolved. Overall symptoms more consistent with delirium rather than CVA. Per neurology, continue P2Y12 monotherapy with aggressive risk factor modification. As above, with LM disease, patient switched to Brilinta.  Patient switched to Crestor 40mg  from Atorvastatin. Reports to have had difficulty swallowing Atorvastatin.  Patient evaluated by PT/OT with recommendation of ambulation/transfer assistance as well as assistance with cooking/housework.   DM type II Patient with A1c 12.3. Per [facility] hospitalist recommendations, will discharge on Lantus 35mg  BID. Needs close follow up with PCP  Anxiety and depression Continue Delirium at discharge.       Did the patient have an acute coronary syndrome (MI, NSTEMI, STEMI, etc) this admission?:  Yes                               AHA/ACC ACS Clinical Performance & Quality Measures: Aspirin prescribed? - No - anaphylactic reaction ADP Receptor Inhibitor (Plavix/Clopidogrel, Brilinta/Ticagrelor or Effient/Prasugrel) prescribed (includes medically managed patients)? - Yes Beta Blocker prescribed? - Yes High Intensity Statin (Lipitor 40-80mg  or Crestor 20-40mg ) prescribed? - Yes EF assessed during THIS hospitalization? - Yes For EF <40%, was ACEI/ARB prescribed? - Not Applicable (EF >/= 40%) For EF <40%, Aldosterone Antagonist (Spironolactone or Eplerenone) prescribed? - Not Applicable (EF >/=  40%) Cardiac Rehab Phase II ordered (including medically managed patients)? - No - pending staged PCI        The patient has been scheduled for a TOC follow up appointment.  A message has been sent to the [facility] and Scheduling Pool at the office where the patient should be seen for follow up.  _____________  Physical Exam .    Discharge Vitals Blood pressure 116/79, pulse 83, temperature 98.8 F (37.1 C), temperature source Oral, resp. rate 15, height 5\' 5"  (1.651 m), weight 94.7 kg, last menstrual period 01/24/2015, SpO2 100%.  Filed Weights   01/09/24 2237 01/11/24 1450  Weight: 98.1 kg 94.7 kg   GEN: Sitting up in chair no acute distress.  Awake and alert.  Coherent speech.  Easily distracted Neck: No carotid bruit noted.  No JVD Cardiac: Distant heart sounds.  RRR with no M/R/G. Respiratory: CTAB, nonlabored good air movement. GI:.  Soft/NT/ND NABS, HSM MS: No edema Neuro: Mostly nonfocal exception of left arm and leg numbness as well as left leg weakness.  Labs & Radiologic Studies    CBC Recent Labs    01/12/24 0731 01/13/24 0305  WBC 8.0 5.9  NEUTROABS 5.5 3.2  HGB 13.5 13.2  HCT 39.2 38.9  MCV 97.8 98.7  PLT 209 183   Basic Metabolic Panel Recent Labs    60/45/40 0731 01/13/24 0305 01/13/24 0838  NA 137  --  136  K 3.8  --  3.6  CL 104  --  102  CO2 27  --  27  GLUCOSE 186*  --  221*  BUN 7  --  13  CREATININE 0.83  --  1.09*  CALCIUM 8.6*  --  8.5*  MG 1.8 1.8  --    Liver Function Tests No results for input(s): "AST", "ALT", "ALKPHOS", "BILITOT", "PROT", "ALBUMIN" in the last 72 hours. No results for input(s): "LIPASE", "AMYLASE" in the last 72 hours. High Sensitivity Troponin:   Recent Labs  Lab 01/09/24 2331 01/10/24 0115 01/10/24 0446 01/10/24 1259  TROPONINIHS 20* 21* 20* 15    BNP Invalid input(s): "POCBNP" D-Dimer No results for input(s): "DDIMER" in the last 72 hours. Hemoglobin A1C No results for input(s): "HGBA1C" in the  last 72 hours. Fasting Lipid Panel No results for input(s): "CHOL", "HDL", "LDLCALC", "TRIG", "CHOLHDL", "LDLDIRECT" in the last 72 hours. Thyroid Function Tests Recent Labs    01/11/24 2140  TSH 0.498   _____________      MR BRAIN WO CONTRAST Result Date: 01/12/2024 CLINICAL DATA:  Confusion after catheterization. EXAM: MRI HEAD WITHOUT CONTRAST TECHNIQUE: Multiplanar, multiecho pulse sequences of the brain and surrounding structures were obtained without intravenous contrast. COMPARISON:  Head CT and CTA from yesterday.  Brain MRI 03/31/2022 FINDINGS: Brain: Small acute infarct in the right cerebellum, left occipital cortex, and right parietal cortex. The supratentorial infarcts are small and confound ADC localization, possibly subacute. Mild chronic small vessel ischemia in the cerebral white matter. No abnormal mineralization. No hemorrhage, hydrocephalus, mass, or collection. Vascular: Major flow voids are preserved Skull and upper cervical spine: Normal marrow signal Sinuses/Orbits: No acute finding. Other: Motion artifact intermittently moderate to severe. IMPRESSION: 3 punctate acute to subacute infarcts seen in the right cerebellum and bilateral cerebral cortex. Chronic small vessel ischemia. Intermittently significant motion artifact. Electronically Signed   By: Tiburcio Pea M.D.   On: 01/12/2024 11:59   CT ANGIO HEAD NECK W WO CM (CODE STROKE) Result Date: 01/11/2024 CLINICAL DATA:  Neuro deficit, acute, stroke suspected. EXAM: CT ANGIOGRAPHY HEAD AND NECK WITH AND WITHOUT CONTRAST TECHNIQUE: Multidetector CT imaging of the head and neck was performed using the standard protocol during bolus administration of intravenous contrast. Multiplanar CT image reconstructions and MIPs were obtained to evaluate the vascular anatomy. Carotid stenosis measurements (when applicable) are obtained utilizing NASCET criteria, using the distal internal carotid diameter as the denominator. RADIATION DOSE  REDUCTION: This exam was performed according to the departmental dose-optimization program which includes automated exposure control, adjustment of the mA and/or kV according to patient size and/or use of iterative reconstruction technique. CONTRAST:  75mL OMNIPAQUE IOHEXOL 350 MG/ML SOLN COMPARISON:  Head CT earlier same day. MRI 03/31/2022. CT angiography 03/30/2022. FINDINGS: CTA NECK FINDINGS Aortic arch: No aortic atherosclerotic calcification is seen. Branching pattern is normal. Right carotid system: Common carotid artery widely patent to the bifurcation. Calcified plaque at the carotid bifurcation and ICA bulb but no stenosis. Cervical ICA patent to the skull base. Left carotid system: Common carotid artery widely patent to the bifurcation. Soft and calcified plaque at the carotid bifurcation and ICA bulb but no stenosis. Cervical ICA patent to the skull base. Vertebral arteries: Both vertebral artery  origins are patent. Both vessels are patent through the cervical region to the foramen magnum. Skeleton: Ordinary spondylosis. Other neck: Enlarged heterogeneous thyroid gland. Apparent enlarging lower density component at the inferior left lobe. As recommended previously, thyroid ultrasound is suggested evaluate for appropriateness of biopsy. Upper chest: Lung apices are clear. Review of the MIP images confirms the above findings CTA HEAD FINDINGS Anterior circulation: Both internal carotid arteries are patent through the skull base and siphon regions. The anterior and middle cerebral vessels are patent. No large vessel occlusion or proximal stenosis. No aneurysm or vascular malformation. Posterior circulation: Both vertebral arteries widely patent to the basilar artery. No basilar stenosis. Posterior circulation branch vessels are patent. Patent bilateral posterior communicating arteries. Venous sinuses: Patent and normal. Anatomic variants: None significant. Review of the MIP images confirms the above  findings IMPRESSION: 1. No intracranial large vessel occlusion or proximal stenosis. 2. Atherosclerotic change at both carotid bifurcations but without stenosis. 3. Enlarged heterogeneous thyroid gland. Apparent enlarging lower density component at the inferior left lobe. As recommended previously, thyroid ultrasound is suggested evaluate for appropriateness of biopsy. Electronically Signed   By: Paulina Fusi M.D.   On: 01/11/2024 13:26   CT HEAD CODE STROKE WO CONTRAST Result Date: 01/11/2024 CLINICAL DATA:  Code stroke. Neuro deficit, acute, stroke suspected. Left eye pain during heart catheterization. Altered mental status. EXAM: CT HEAD WITHOUT CONTRAST TECHNIQUE: Contiguous axial images were obtained from the base of the skull through the vertex without intravenous contrast. RADIATION DOSE REDUCTION: This exam was performed according to the departmental dose-optimization program which includes automated exposure control, adjustment of the mA and/or kV according to patient size and/or use of iterative reconstruction technique. COMPARISON:  MRI 03/31/2022 FINDINGS: Brain: No acute CT finding. Mild/minimal small vessel change of the cerebral hemispheric white matter. No sign of acute infarction, mass lesion, hemorrhage, hydrocephalus or extra-axial collection. Vascular: There is atherosclerotic calcification of the major vessels at the base of the brain. Skull: Negative Sinuses/Orbits: Clear/normal Other: None ASPECTS (Alberta Stroke Program Early CT Score) - Ganglionic level infarction (caudate, lentiform nuclei, internal capsule, insula, M1-M3 cortex): 7 - Supraganglionic infarction (M4-M6 cortex): 3 Total score (0-10 with 10 being normal): 10 IMPRESSION: 1. No acute CT finding. Mild/minimal small vessel change of the cerebral hemispheric white matter. 2. Aspects is 10. 3. These results were communicated to Dr. Selina Cooley at 12:56 pm on 01/11/2024 by text page via the [facility] messaging system. Electronically Signed    By: Paulina Fusi M.D.   On: 01/11/2024 12:57   DG Chest Port 1 View Result Date: 01/09/2024 CLINICAL DATA:  Chest pain for 2 days. EXAM: PORTABLE CHEST 1 VIEW COMPARISON:  Radiograph and CT January 2024 FINDINGS: The cardiomediastinal contours are normal. The lungs are clear. Pulmonary vasculature is normal. No consolidation, pleural effusion, or pneumothorax. No acute osseous abnormalities are seen. IMPRESSION: No active disease. Electronically Signed   By: Narda Rutherford M.D.   On: 01/09/2024 23:23   Disposition   Pt is being discharged home today in good condition.  Follow-up Plans & Appointments     Discharge Instructions     AMB referral to Phase II Cardiac Rehabilitation   Complete by: As directed    Diagnosis: NSTEMI   After initial evaluation and assessments completed: Virtual Based Care may be provided alone or in conjunction with Phase 2 Cardiac Rehab based on patient barriers.: Yes   Intensive Cardiac Rehabilitation (ICR) [facility] location only OR Traditional Cardiac Rehabilitation (TCR) *If criteria for  ICR are not met will enroll in TCR [facility] only): Yes   Diet - low sodium heart healthy   Complete by: As directed    Increase activity slowly   Complete by: As directed         Discharge Medications   Allergies as of 01/13/2024       Reactions   Aspirin Anaphylaxis, Swelling   Tongue swells   Tramadol Hives   Hydrocodone Rash   Ibuprofen Itching   Morphine And Codeine Itching   Orange Fruit [citrus] Rash        Medication List     PAUSE taking these medications    losartan 50 MG tablet Wait to take this until your doctor or other care provider tells you to start again. Commonly known as: COZAAR Take 1 tablet (50 mg total) by mouth at bedtime.       STOP taking these medications    atorvastatin 80 MG tablet Commonly known as: LIPITOR   clopidogrel 75 MG tablet Commonly known as: PLAVIX   glipiZIDE 10 MG tablet Commonly known as: GLUCOTROL        TAKE these medications    Accu-Chek Guide test strip Generic drug: glucose blood Use as instructed to check blood sugar three times daily.   albuterol 108 (90 Base) MCG/ACT inhaler Commonly known as: VENTOLIN HFA INHALE 2 PUFFS BY MOUTH EVERY 6 HOURS AS NEEDED FOR SHORTNESS OF BREATH OR WHEEZING   B-D UF III MINI PEN NEEDLES 31G X 5 MM Misc Generic drug: Insulin Pen Needle USE AS DIRECTED 2 TIMES A DAY   carvedilol 3.125 MG tablet Commonly known as: COREG Take 1 tablet (3.125 mg total) by mouth 2 (two) times daily with a meal. What changed:  medication strength how much to take   diclofenac Sodium 1 % Gel Commonly known as: VOLTAREN Apply 2 g topically 2 (two) times daily as needed.   DULoxetine 60 MG capsule Commonly known as: Cymbalta Take 1 capsule (60 mg total) by mouth daily.   ezetimibe 10 MG tablet Commonly known as: ZETIA Take 1 tablet (10 mg total) by mouth daily. Start taking on: January 14, 2024   famotidine 20 MG tablet Commonly known as: PEPCID TAKE 1 TABLET BY MOUTH TWICE DAILY   furosemide 40 MG tablet Commonly known as: LASIX Take 1 tablet (40 mg total) by mouth daily. What changed:  medication strength how much to take   isosorbide mononitrate 60 MG 24 hr tablet Commonly known as: IMDUR Take 1 tablet (60 mg total) by mouth daily.   Lantus SoloStar 100 UNIT/ML Solostar Pen Generic drug: insulin glargine Inject 35 Units into the skin 2 (two) times daily. What changed: how much to take   nitroGLYCERIN 0.4 MG SL tablet Commonly known as: NITROSTAT PLACE 1 TABLET UNDER THE TOUNGE EVERY 5 MINS  AS NEEDED FOR CHEST PAIN   potassium chloride 10 MEQ tablet Commonly known as: KLOR-CON M Take 2 tablets (20 mEq total) by mouth daily.   rosuvastatin 40 MG tablet Commonly known as: CRESTOR Take 1 tablet (40 mg total) by mouth daily. Start taking on: January 14, 2024   ticagrelor 90 MG Tabs tablet Commonly known as: BRILINTA Take 1 tablet (90 mg  total) by mouth 2 (two) times daily.   True Metrix Meter w/Device Kit use as directed three times a day   TRUEplus Lancets 28G Misc use as directed mto test blood sugars 3 times a day  Outstanding Labs/Studies   Needs BMP at follow up.  Duration of Discharge Encounter: APP Time: 25 minutes   Signed, Perlie Gold, PA-C 01/13/2024, 2:36 PM   ATTENDING ATTESTATION  I have seen, examined and evaluated the patient this morning on rounds.  After reviewing all the available data in the chart, labs and findings/studies, I discussed my findings examination and recommendations with Sandra Nickel, PA who completed the discharge summary.  I agree with his ospital summary with impressions and recommendations as per our discussion.    Attending adjustments noted in italics.   The patient was much more lucid and clear today and indicated that she would like to go home.  Options for treatment are now limited to medical management versus LM PCI.  Optimally it would be best to allow for 3 to 4 weeks for recovery from her postprocedural stroke, however with her significant symptoms we discussed the possibility of simply proceeding with staged PCI using minimal sedation.  However today prior to discharge she was able to ambulate the hallway without anginal pain and felt stable for discharge.  Plan is for minimal activity on discharge, we have monitor for importance of returning for recurrence of symptoms or worsening symptoms and we would proceed with PCI sooner.  She is currently scheduled for staged PCI April 9 with Dr. Excell Seltzer via femoral access and minimal sedation, IVUS guided PCI followed by Perclose groin closure to allow for decreased bedrest time.  We had adjusted medications to allow for permissive hypertension and her high dose of carvedilol losartan initially held and then gradually restarted with low-dose carvedilol and plans to restart losartan in the outpatient setting. Poor  glycemic control.  With assistance of the [facility] hospitalist we were able to manage her delirium and he also assisted with titrating up diabetes management.  Needs close outpatient follow-up.   I spent 57 minutes in the care of [name] today including reviewing labs (2 minutes), reviewing studies (5 minutes reviewing Films, echo, CT and MRI on last time along with telemetry), face to face time discussing treatment options (30 minutes-this involves a telephone conversation there was three-way between the daughter and the son and the patient and myself in the room with the patient.  We discussed different treatment options and she indicated that she would like to go home, stating issues stable walking in the hallway.), reviewing records from consulting hospitalist and neurology service (5 minutes), additional 15 dictating, and documenting in the encounter.      Marykay Lex, MD, MS Bryan Lemma, M.D., M.S. Interventional Cardiologist  [facility]  Pager # [phone] Phone # [phone] [address] [place], [place] [address]

## 2024-01-14 ENCOUNTER — Telehealth: Payer: Self-pay

## 2024-01-14 NOTE — Transitions of Care (Post Inpatient/ED Visit) (Signed)
   01/14/2024  Name: LILLIEMAE FRUGE MRN: [id number] DOB: April 21, 1966  Today's TOC FU Call Status: Today's TOC FU Call Status:: Unsuccessful Call (1st Attempt) Unsuccessful Call (1st Attempt) Date: 01/14/24  Attempted to reach the patient regarding the most recent Inpatient/ED visit.  Follow Up Plan: Additional outreach attempts will be made to reach the patient to complete the Transitions of Care (Post Inpatient/ED visit) call.   Khadeja Abt A. Mliss Fritz RN, BA, [facility], CRRN [facility] RN Care Manager, Transition of Care [phone]

## 2024-01-15 ENCOUNTER — Telehealth: Payer: Self-pay

## 2024-01-15 NOTE — Telephone Encounter (Signed)
 Copied from CRM [id number]. Topic: Clinical - Home Health Verbal Orders >> Jan 15, 2024 12:04 PM Alessandra Bevels wrote: Judie Grieve with [facility] is calling to request [facility] PT with Frequency- 1 W 8 , to work on Heritage manager and Stamina  [phone] verbal ok on VM

## 2024-01-15 NOTE — Telephone Encounter (Signed)
 Call to [facility] with [facility] to give V.O for [facility] PT with Frequency- 1 W 8 , to work on Nutritional therapist. Unable to reach VM left.

## 2024-01-15 NOTE — Transitions of Care (Post Inpatient/ED Visit) (Signed)
   01/15/2024  Name: VINESSA MACCONNELL MRN: [id number] DOB: 25-Jan-1966  Today's TOC FU Call Status: Today's TOC FU Call Status:: Unsuccessful Call (2nd Attempt) Unsuccessful Call (2nd Attempt) Date: 01/15/24  Attempted to reach the patient regarding the most recent Inpatient/ED visit.  Follow Up Plan: Additional outreach attempts will be made to reach the patient to complete the Transitions of Care (Post Inpatient/ED visit) call.   Tayja Manzer A. Mliss Fritz RN, BA, [facility], CRRN [facility] RN Care Manager, Transition of Care [phone]

## 2024-01-15 NOTE — Progress Notes (Deleted)
 Cardiology Office Note:    Date:  01/15/2024   ID:  [name], DOB [birth year], MRN [id number]  PCP:  Joaquin Mulberry, MD  Cardiologist:  Wendie Hamburg, MD Cardiology APP:  Narvis Bad, PA (Inactive) { Click to update primary MD,subspecialty MD or APP then REFRESH:1}    Referring MD: Joaquin Mulberry, MD   Chief Complaint: hospital follow-up of unstable angina  History of Present Illness:    [name] is a 58 y.o. female with a history of CAD s/p multiple intervention (DES to LCX and PDA in 10/2019 in setting of NSTEMI and DES to in-stent restenosis of PDA in 03/2022 in setting of NSTEM), chronic HFpEF, CVA following cardiac cath in 03/2022 and 01/2024, DVT in 05/2022, questionable sarcoidosis, hypertension, hyperlipidemia, type 2 diabetes mellitus who presents today for hospital follow-up of unstable angina.  Patient was admitted to [facility] in 10/2019 for NSTEMI. Cardiac catheterization at that time showed severe stenosis of LCX and PDA and moderate stenosis of D2. She underwent successful PCI with DES to LCX and PDA. She continued to have intermittent exertional chest pain following this. Myoview  in 06/2020 was normal. She was admitted at [facility] in 03/2022 for repeat NSTEMI. Cath showed 99% in-stent restenosis of prior PDA stent with residual 90% stenosis of D2. She underwent successful PCI with DES to PDA lesion but D2 disease was treated medically. Echo during that admission showed LVEF of 60-65% with moderate LVH but normal wall motion and diastolic parameters, normal RV function, and no significant valvular disease. Hospitalization was complicated by acute CVA following cath which was treated with TPA.   She was seen by Lawana Pray, NP, in 06/2023 at which time she reported several days of increased work of breathing with physical activity, lower extremity edema, and an 8lb weight gain. She reported dietary/ fluid indiscretion at that time. Lasix  was increased for 3  days. Repeat Echo was ordered and showed LVEF of 60-65% with mild LVH with moderate hypertrophy of the basal-septal segment and grade 1 diastolic dysfunction, normal RV function, and borderline dilatation of the ascending aorta measuring 38 mm. She was seen by Lawana Pray, NP, for follow-up later that same month and was doing better from a cardiac standpoint. However, she did report a cough and noted coughing up some blood. She was advised to follow-up with PCP.  She was recently admitted from 01/09/2024 to 01/13/2024 for progressive dyspnea and chest pain on exertion concerning for unstable. She was noted to be markedly hypertensive on arrival with systolic BP in the 180s to 190s. High-sensitivity troponin was 20 >> 21 >> 20 not consistent with ACS. LHC showed showed significant multivessel including eccentric 70% ostial stenosis of left main artery (with no improvement with intracoronary Nitroglycerin ), 80% stenosis of ostial D2, 50% stenosis of mid LAD, 60% stenosis of proximal to mid LCX upstream from previously placed stent, and total occlusion of overlapping PDA stents. LVEDP was severely elevaed at 35 mmHg. She was diuresed with IV Lasix . Echo showed LVEF of 70-75% with normal wall motion, mild LVH, and grade 1 diastolic dysfunction. Hospitalization was complicated by acute CVA the morning after cardiac catheterization and delirium. Neurology was consulted but TNK was not given. She was seen by CT surgery and not felt to be a candidate for CABG. Initial plan was for inpatient PCI of left main. However, given improvement in exertional symptoms as well peri-procedural CVA, decision made to discharge home with staged PCI in about 3 weeks.  Patient presents today for follow-up. ***  CAD History of DES to LCX and PDA in 10/2019 and then DES to in-stent restenosis of PDA in 03/2022. She was most recently admitted for unstable angina earlier this month. LHC showed showed significant multivessel including  eccentric 70% ostial stenosis of left main artery, 80% stenosis of ostial D2, 50% stenosis of mid LAD, 60% stenosis of proximal to mid LCX upstream from previously placed stent, and total occlusion of overlapping PDA stents. She was not felt to be a candidate for CABG. Initial plan was for inpatient PCI of left main. However, given improvement in exertional symptoms as well peri-procedural CVA, decision made to discharge home with staged PCI in about 3 weeks.  - *** - Continue Imdur  60mg  daily and Coreg  3.125mg  twice daily. *** - Continue Brilinta  90mg  twice daily. History of anaphylaxis with Aspirin . *** - Continue statin. - Plan is for PCI of left main on 02/10/2024. Will check BMET and CBC today. *** - The patient understands that risks include but are not limited to stroke (1 in 1000), death (1 in 1000), kidney failure [usually temporary] (1 in 500), bleeding (1 in 200), allergic reaction [possibly serious] (1 in 200), and agrees to proceed.   Chronic HFpEF Echo during recent admission earlier this month showed LVEF of 70-75% with normal wall motion, mild LVH, and grade 1 diastolic dysfunction. LVEDP was markedly elevated at 35 mmHg on LHC.  - Euvolemic on exam. *** - Continue Lasix  40mg  daily.  - Will hold off on adding SGLT2 inhibitor given severely elevated hemoglobin A1c.  - Discussed importance of daily weights and sodium/ fluid restrictions.  - Will check BMET. *** Considering adding Spironolactone ***  Hypertension BP *** - Continue current medications: Losartan  50mg  daily, Coreg  3.125mg  twice daily, and Imdur  60mg  daily.   Hyperlipidemia Lipid pane on 01/10/2024: Total Cholesterol 249, Triglycerides 361, HDL 42, LDL 135. LDL goal <55 given CAD and CVA. - Continue Crestor  40mg  daily and Zetia  10mg  daily. *** - Will refer to [facility] for consideration of PCSK9 inhibitor. ***  Type 2 Diabetes Mellitus - Uncontrolled Hemoglobin A1c 12.3 on 01/10/2024.  - On Insulin .  -  Managem  My Note     Progress Notes  Cardiology  01/15/2024 07:59 AM   Insert SmartText ent per PCP.  History of CVA History of CVA following cardiac catheterization in 03/2022 and again earlier this month after cardiac catheterization.  - Continue antiplatelet and statin therapy as above.   EKGs/Labs/Other Studies Reviewed:    The following studies were reviewed:  Left Cardiac Catheterization 01/11/2024: Conclusions: Significant multivessel coronary artery disease, including eccentric 70% ostial LMCA stenosis (no improvement with intracoronary NTG), 80% ostial D2 stenosis, long segment of mid LAD disease of up to 50%, 60% proximal/mid LCx stenosis upstream from previously placed stent and chronic total occlusion of overlapping rPDA stents. Widely patent mid LCx stent. Chronically occluded rPDA stents. Normal left ventricular systolic function (LVEF 55-65%). Severely elevated left ventricular filling pressure (LVEF 35 mmHg). Challenging engagement of both coronary arteries due to radial artery vasospasm and unfavorable take-off of LMCA.  Consider alternative access for future catheterizations.   Recommendations: Cardiac surgery consultation for CABG.  If the patient is not a candidate for CABG, high risk PCI to LMCA +/- LAD/D2 would need to be considered. Resume heparin  infusion 2 hours after TR band deflation. Hold clopidogrel  pending cardiac surgery consultation.  Defer initiation of aspirin  given history of severe aspirin  allergy. Escalate diuresis  to furosemide  40 mg IV BID. Continue aggressive secondary prevention of coronary artery disease.  Diagnostic Dominance: Right   _______________  Echocardiogram 01/12/2024: Impressions: 1. Left ventricular ejection fraction, by estimation, is 70 to 75%. The  left ventricle has hyperdynamic function. The left ventricle has no  regional wall motion abnormalities. There is mild concentric left  ventricular hypertrophy. Left  ventricular  diastolic parameters are consistent with Grade I diastolic dysfunction  (impaired relaxation).   2. Right ventricular systolic function is normal. The right ventricular  size is normal.   3. The mitral valve is normal in structure. No evidence of mitral valve  regurgitation. No evidence of mitral stenosis.   4. The aortic valve is normal in structure. Aortic valve regurgitation is  not visualized. No aortic stenosis is present.   5. The inferior vena cava is normal in size with greater than 50%  respiratory variability, suggesting right atrial pressure of 3 mmHg.    EKG:  EKG ordered today. EKG personally reviewed and demonstrates ***.  Recent Labs: 01/11/2024: TSH 0.498 01/13/2024: BUN 13; Creatinine, Ser 1.09; Hemoglobin 13.2; Magnesium  1.8; Platelets 183; Potassium 3.6; Sodium 136  Recent Lipid Panel    Component Value Date/Time   CHOL 249 (H) 01/10/2024 0446   CHOL 149 10/08/2022 1035   TRIG 361 (H) 01/10/2024 0446   HDL 42 01/10/2024 0446   HDL 47 10/08/2022 1035   CHOLHDL 5.9 01/10/2024 0446   VLDL 72 (H) 01/10/2024 0446   LDLCALC 135 (H) 01/10/2024 0446   LDLCALC 86 10/08/2022 1035    Physical Exam:    Vital Signs: LMP 01/24/2015 (Approximate)     Wt Readings from Last 3 Encounters:  01/11/24 208 lb 12.4 oz (94.7 kg)  09/16/23 216 lb 3.2 oz (98.1 kg)  07/03/23 209 lb 3.2 oz (94.9 kg)     General: 58 y.o. female in no acute distress. HEENT: Normocephalic and atraumatic. Sclera clear.  Neck: Supple. No carotid bruits. No JVD. Heart: *** RRR. Distinct S1 and S2. No murmurs, gallops, or rubs.  Lungs: No increased work of breathing. Clear to ausculation bilaterally. No wheezes, rhonchi, or rales.  Abdomen: Soft, non-distended, and non-tender to palpation.  Extremities: No lower extremity edema.  Radial and distal pedal pulses 2+ and equal bilaterally. Skin: Warm and dry. Neuro: No focal deficits. Psych: Normal affect. Responds  appropriately.   Assessment:    No diagnosis found.  Plan:     Disposition: Follow up in ***   Signed, Casimer Clear, PA-C  01/15/2024 8:01 AM     [facility]

## 2024-01-18 ENCOUNTER — Telehealth: Payer: Self-pay

## 2024-01-18 NOTE — Transitions of Care (Post Inpatient/ED Visit) (Signed)
   01/18/2024  Name: [name] MRN: [id number] DOB: 06/11/1966  Today's TOC FU Call Status: Today's TOC FU Call Status:: Unsuccessful Call (3rd Attempt) Unsuccessful Call (3rd Attempt) Date: 01/18/24  Attempted to reach the patient regarding the most recent Inpatient/ED visit.  Follow Up Plan: No further outreach attempts will be made at this time. We have been unable to contact the patient.  Markes Shatswell A. Mliss Fritz RN, BA, [facility], CRRN [facility] RN Care Manager, Transition of Care [phone]

## 2024-01-21 ENCOUNTER — Ambulatory Visit: Attending: Student | Admitting: Student

## 2024-01-25 ENCOUNTER — Other Ambulatory Visit: Payer: Self-pay

## 2024-01-26 ENCOUNTER — Telehealth: Payer: Self-pay

## 2024-01-26 NOTE — Telephone Encounter (Signed)
 Copied from CRM [id number]. Topic: General - Transportation >> Jan 26, 2024 12:36 PM Antwanette L wrote: Reason for CRM: Patient has a hospital f/u on 3/27 and needs to make arrangements for transportation. Please contact patient at [phone]

## 2024-01-27 NOTE — Progress Notes (Deleted)
 Patient ID: [name], female   DOB: 1966-03-23, 58 y.o.   MRN: [id number] Principal Problem:   Acute coronary syndrome [facility]) Active Problems:   Essential hypertension, benign   Uncontrolled type 2 diabetes mellitus with hyperglycemia, with long-term current use of insulin (HCC)   H/o NSTEMI (non-ST elevated myocardial infarction) (HCC)   CAD S/P percutaneous coronary angioplasty   Hyperlipidemia associated with type 2 diabetes mellitus (HCC)   Stroke (cerebrum) (HCC)   Hemiparesis affecting left side as late effect of cerebrovascular accident [facility])   Coronary artery disease involving native coronary artery of native heart with unstable angina pectoris (HCC)   Hypertensive heart disease with heart failure (HCC)    [name] is a 58 y.o. 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 Patient presented to the ED with progressive exertional dyspnea/exertional limitation/anginal chest pain with symptoms concerning for acute coronary syndrome and trivial troponin elevation.  Initially presented hypertensive and blood pressures in the 180s to 190s concerning for potential accelerated hypertension/hypertensive urgency. => She had 2 weeks of progressive dyspnea and chest pain on exertion able to only walk very 50 feet with her walker before developing symptoms.  On arrival had 7-8/10 chest pain and had a steady aching discomfort despite being on nitroglycerin infusion. On the morning prior to admission she had severe left-sided chest pain radiating to the left breast with dyspnea and diaphoresis prompting trip to the ER.  Somewhat improvement to nitroglycerin.   Expand All Collapse All     Discharge Summary    Patient ID: [name] MRN: [id number]; DOB: 1966/06/09   Admit date: 01/09/2024 Discharge date: 01/13/2024   PCP:  Hoy Register, MD               [facility] Providers Cardiologist:  Bhalla Ishikawa, MD  Cardiology APP:  Manson Passey, [place] (Inactive)         Discharge Diagnoses    Principal Problem:   Acute coronary syndrome [facility]) Active Problems:   Essential hypertension, benign   Uncontrolled type 2 diabetes mellitus with hyperglycemia, with long-term current use of insulin (HCC)   H/o NSTEMI (non-ST elevated myocardial infarction) (HCC)   CAD S/P percutaneous coronary angioplasty   Hyperlipidemia associated with type 2 diabetes mellitus (HCC)   Stroke (cerebrum) (HCC)   Hemiparesis affecting left side as late effect of cerebrovascular accident [facility])   Coronary artery disease involving native coronary artery of native heart with unstable angina pectoris (HCC)   Hypertensive heart disease with heart failure [facility])     Diagnostic Studies/Procedures    Cardiac Cath 01/11/2024: Significant MV CAD-ostial, eccentric 70% LM, 80% ostial small D2, long segment of mid LAD up to 50%.  60% proximal LCx and CTO of overlapping stents in the PDA.  Widely patent LCx stent.  EF.  LVEDP 35 mmHg.--Noted difficulty with radial access! => Recommendation was to consider CABG versus left main PCI.  Increased diuretic with 40 mg IV twice daily  Echo 01/12/2024: Hyperdynamic LV with EF of 70 to 75%.  No regional wall motion normality.  Mild concentric LVH.  Normal PAP.  Possible liver cyst.   Radiology:: CT Head with/without Contrast 01/11/2024: No acute findings, mild to minimal small vessel change.  CTA showed no large vessel occlusion or proximal stenosis.  Bilateral carotid bifurcation atherosclerosis enlarged thyroid MRI brain 01/12/2024:3 punctate acute/subacute infarcts  seen in the right cerebellum and bilateral cerebral cortex.  Chronic small vessel disease.  Motion artifact noted. _____________   History of Present Illness     [name] is a 58 y.o. female with history of CAD (prior PCI to mid-LCX and PDA  2020), in-stent restenosis of PDA stent s/p DES 03/2022, CVA following PCI, DVT, T2DM, hypertension, hyperlipidemia, sarcoidosis. Patient presented to the ED with progressive exertional dyspnea/exertional limitation/anginal chest pain with symptoms concerning for acute coronary syndrome and trivial troponin elevation.  Initially presented hypertensive and blood pressures in the 180s to 190s concerning for potential accelerated hypertension/hypertensive urgency. => She had 2 weeks of progressive dyspnea and chest pain on exertion able to only walk very 50 feet with her walker before developing symptoms.  On arrival had 7-8/10 chest pain and had a steady aching discomfort despite being on nitroglycerin infusion. On the morning prior to admission she had severe left-sided chest pain radiating to the left breast with dyspnea and diaphoresis prompting trip to the ER.  Somewhat improvement to nitroglycerin.   Hospital Course     Consultants: neurology, [facility] 3   Patient underwent cardiac catheterization on the following morning after admission, unfortunately was found to have left main disease but near the end the procedure she developed a headache and subsequently in the holding area had notable altered mental status with agitation and confusion that led to code stroke being called leading to head CT with CTA head there were both not revealing.  She required sedation for the study but results as well as for MRI which showed small, focal 3 punctate lesions however with significant altered ental status and somnolence and agitation out of proportion to extent of findings on the MRI.  The following day she was awake and alert and arousable with no significant residual symptoms.     ACS/CAD with unstable angina Hyperlipidemia Patient previously presented for an NSTEMI in 10/2019 to [facility]; cath demonstrated severe stenosis of Lcx and PDA with moderate stenosis of D2 s/p DES to PDA and mid-LCX. She  subsequently continued to have intermittent chest pain that worsened on exertion and improved with nitroglycerin. She represented with chest pain on 05/05/2020; left prior to complete evaluation. Lexiscan on 06/15/20 was normal. TTE on 07/02/20 demonstrated normal Bi-V function. She had a repeat NSTEMI in 03/2022 with cath demonstrating 99% ISR of PDA stent with residual D2 (80%) lesion due to proximity to her LAD. Her course c/b an acute CVA after cath and she was given TPA. On admission, patient reported 2 weeks of progressive dyspnea and chest pain on exertion; now only able to walk 50 ft with her walker prior to developing symptoms. She also reports episodes waking up in the middle of night with chest pain; she does have some improvement of her chest pain when leaning forward compared to laying flat. She described her chest pain as pressure up to 10/10, no improvement with nitroglycerin. Troponin 18->20. She was started on heparin, imdur. Plans made for LHC given symptoms consistent with unstable angina. Patient underwent LHC as above with Dr. Okey Dupre on 3/10. This found 70% ostial left main disease, 80% Diagonal 2, widely patent left Cirucmflex stent, and chronically occluded overlapping rPDA stents. Patient was seen by Dr. Leafy Ro and given co-morbidities, deemed not a CABG candidate. Initial plan was for inpatient PCI of left main. However, given improvement in exertional symptoms as well peri-procedural CVA, decision made to discharge home with staged PCI in ~3 weeks. Close outpatient  follow up arranged Plavix initially restarted but given LM disease and no surgical plans, converted to Brilinta. HLD open goal LDL<55): Continue Crestor 40mg  (switched from Atorvastatin due to chronic/prior to admission difficulty with pill size) and Zetia 10mg .  Continue Imdur 60mg  Continue Coreg 3.125mg  BID => plan to titrate back to home dose in the outpatient regimen as BP tolerates.  Would then add back ARB.Marland Kitchen   Attending  Attestation:  I had another long talk today with the patient and with her daughter and son on the telephone about plan of care. Had reviewed the images with Drs. Excell Seltzer, [place] and [place] who all agree that now with a second stroke her best option would be left main PCI especially in light of being turned down for surgery by CVTS already. The decision was whether or not we do left main PCI prior to discharge or let her go home to allow for time to recover from her periprocedural stroke. Yesterday it appeared that she was having significant exertional angina with minimal activity, but today seems to be doing better and was able to walk in the hallway without symptoms. She would like to go home. And from a true safety standpoint the best option would be for her to go home for a couple weeks to recover from the stroke and this procedure. We will plan to have her seen back in 1 to 2 weeks to reassess her blood pressure and likely titrate medications. We will plan for left main PCI in roughly 3 weeks with Dr. Excell Seltzer with the plan to go femoral access, no sedation and closure post PCI. She would probably stay overnight based on her being left main PCI.    Hypertension -> temporarily allowing for permissive hypertension, she was on high-dose carvedilol and losartan that were held. Patient with profoundly labile BP this admission. Near hypertensive emergency on admission but now with low BP despite holding home medications.  Coreg resumed at lower dose, 3.125 => plan to titrate up in the outpatient setting.   Hold losartan and reassess in outpatient follow up. Restart ARB and follow-up.   Acute periprocedural stroke with delirium During the cath, patient was given nitroglycerin and subsequently developed a headache and altered mental status. In the postop unit code stroke called because of acute delirium, confusion, agitation, as well as aphasia, left facial droop and right lower extremity weakness. Was given  sedation for CT/CTA which did not show any significant acute findings. Neurology saw patient, TNK not given. She was not able to get the MRI done until later on in the evening of 3/10. Throughout the course of the day, she was not following commands and not consistently verbally responsive. Was occasionally combative as well. MRI showed 3 small punctate lesions. Permissive hypertension allowed. By 3/11, aphasia had resolved. Overall symptoms more consistent with delirium rather than CVA. Per neurology, continue P2Y12 monotherapy with aggressive risk factor modification. As above, with LM disease, patient switched to Brilinta.  Patient switched to Crestor 40mg  from Atorvastatin. Reports to have had difficulty swallowing Atorvastatin.  Patient evaluated by PT/OT with recommendation of ambulation/transfer assistance as well as assistance with cooking/housework.    DM type II Patient with A1c 12.3. Per [facility] hospital[facility] recommendations, will discharge on Lantus 35mg  BID. Needs close follow up with PCP   Anxiety and depression Continue Delirium at discharge.

## 2024-01-28 ENCOUNTER — Inpatient Hospital Stay: Admitting: Physician Assistant

## 2024-01-28 DIAGNOSIS — E1159 Type 2 diabetes mellitus with other circulatory complications: Secondary | ICD-10-CM

## 2024-02-01 ENCOUNTER — Other Ambulatory Visit: Payer: Self-pay

## 2024-02-01 NOTE — Patient Outreach (Signed)
 BSW attempted telephone follow up with patient, Patients daughter answered stating patient does not have a phone and she was not near her at the time. Daughter states she will have patient contact BSW.   Medicaid Managed Care   Unsuccessful Outreach Note  02/01/2024 Name: [name] MRN: [id number] DOB: [birth year]  Referred by: Hoy Register, MD Reason for referral : High Risk Managed Medicaid (MM social work telephone outreach )   A second unsuccessful telephone outreach was attempted today. The patient was referred to the case management team for assistance with care management and care coordination.   Follow Up Plan: The patient has been provided with contact information for the care management team and has been advised to call with any health related questions or concerns.   Abelino Derrick, MHA [facility]  Managed [facility] Social Worker [phone]

## 2024-02-01 NOTE — Patient Instructions (Signed)
  Medicaid Managed Care   Unsuccessful Outreach Note  02/01/2024 Name: [name] MRN: [id number] DOB: 04-30-1966  Referred by: Hoy Register, MD Reason for referral : High Risk Managed Medicaid (MM social work telephone outreach )   A second unsuccessful telephone outreach was attempted today. The patient was referred to the case management team for assistance with care management and care coordination.   Follow Up Plan: The patient has been provided with contact information for the care management team and has been advised to call with any health related questions or concerns.   Abelino Derrick, MHA [facility]  Managed [facility] Social Worker [phone]

## 2024-02-02 NOTE — Progress Notes (Deleted)
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 increase in  right ventricular wall thickness. Right ventricular systolic function is  normal.   Left Atrium: Left atrial size was normal in size.   Right Atrium: Right atrial size was normal in size.   Pericardium: There is no evidence of pericardial effusion.   Mitral Valve: The mitral valve is normal in structure. No evidence of  mitral valve regurgitation. No evidence of mitral valve stenosis. MV peak  gradient, 4.6 mmHg. The mean mitral valve gradient is 3.0 mmHg.   Tricuspid Valve: The tricuspid valve is normal in structure. Tricuspid  valve regurgitation is trivial. No evidence of tricuspid stenosis.   Aortic Valve: The aortic valve is normal in structure. Aortic valve  regurgitation is not visualized. No aortic stenosis is present. Aortic  valve mean gradient measures 7.0 mmHg. Aortic valve peak gradient measures  13.5 mmHg. Aortic valve area, by VTI  measures 2.23 cm.   Pulmonic Valve: The pulmonic valve was normal in structure. Pulmonic valve  regurgitation is not visualized. No evidence of pulmonic stenosis.   Aorta: The aortic root is normal in size and structure.   Venous: The  inferior vena cava is normal in size with greater than 50%  respiratory variability, suggesting right atrial pressure of 3 mmHg.   IAS/Shunts: No atrial level shunt detected by color flow Doppler.     Cardiac catheterization 01/11/2024  Conclusions: Significant multivessel coronary artery disease, including eccentric 70% ostial LMCA stenosis (no improvement with intracoronary NTG), 80% ostial D2 stenosis, long segment of mid LAD disease of up to 50%, 60% proximal/mid LCx stenosis upstream from previously placed stent and chronic total occlusion of overlapping rPDA stents. Widely patent mid LCx stent. Chronically occluded rPDA stents. Normal left ventricular systolic function (LVEF 55-65%). Severely elevated left ventricular filling pressure (LVEF 35 mmHg). Challenging engagement of both coronary arteries due to radial artery vasospasm and unfavorable take-off of LMCA.  Consider alternative access for future catheterizations.   Recommendations: Cardiac surgery consultation for CABG.  If the patient is not a candidate for CABG, high risk PCI to LMCA +/- LAD/D2 would need to be considered. Resume heparin  infusion 2 hours after TR band deflation. Hold clopidogrel  pending cardiac surgery consultation.  Defer initiation of aspirin  given history of severe aspirin  allergy. Escalate diuresis to furosemide  40 mg IV BID. Continue aggressive secondary prevention of coronary artery disease.   Sammy Crisp, MD [facility]  Diagnostic Dominance: Right  Intervention  Assessment & Plan   1.  ***   Chet Cota. Dajuan Turnley NP-C    02/02/2024, 11:27 AM [facility] [address] Office [phone] Fax [phone]  Notice: This dictation was prepared with Dragon dictation along with smaller phrase technology. Any transcriptional errors that result from this process are unintentional and may not be corrected upon review.  I spent 13***  minutes examining  this patient, reviewing medications, and using patient centered shared decision making involving her cardiac care.   I spent greater than 20 minutes reviewing her past medical history,  medications, and prior cardiac tests.

## 2024-02-04 ENCOUNTER — Ambulatory Visit: Admitting: General Practice

## 2024-02-05 ENCOUNTER — Telehealth: Payer: Self-pay | Admitting: *Deleted

## 2024-02-05 NOTE — Telephone Encounter (Signed)
-----   Message from Nurse Ernestine Mcmurray sent at 02/05/2024  9:26 AM EDT ----- Regarding: FW: Appt 4/7 Dr Croitru Hi Dr Royann Shivers and Florentina Addison,  I scheduled an office visit for this patient Monday April 7.  She is scheduled for a PCI 02/10/24 with Dr Excell Seltzer. PCI was scheduled when she was discharged from the hospital 01/13/24  Below copied from DC Summary: Plan to have her seen back in 1 to 2 weeks to reassess her blood pressure and likely titrate medications. We will plan for left main PCI in roughly 3 weeks with Dr. Excell Seltzer with the plan to go femoral access, no sedation and closure post PCI. She would probably stay overnight based on her being left main PCI.   She has missed 2 hospital follow up appointments,01/21/24 and yesterday 02/04/24.  I talked with her daughter, Dahlia Byes, this morning and she said will make sure her mother keeps appt with Dr Royann Shivers Monday 4/7. Her daughter also knows if she does not keep appt Monday, PCI will need to be cancelled. She will need BMP/CBC/EKG on Monday.  She will need to be given instructions if she proceeds with PCI. Arrive 6:30 AM procedure 8:30 AM-she should take Brilinta morning of procedure,looks like she has aspirin allergy-otherwise would be usual cath instructions. I did not want to give them instructions if she did not show Monday or decision is not to proceed.  I appreciate your help with this!  Thanks, Thurston Hole ----- Message ----- From: Andreas Blower Sent: 02/04/2024   2:10 PM EDT To: Jacqlyn Krauss, RN Subject: Appt                                           Pt called today right before her appt and said she couldn't come to appt. She said that you had worked her in. I was letting you know that she didn't come. There were no sooner appts so I told her I would have you call her to see when she can come in

## 2024-02-08 ENCOUNTER — Ambulatory Visit: Attending: Cardiovascular Disease | Admitting: Cardiovascular Disease

## 2024-02-08 ENCOUNTER — Encounter: Payer: Self-pay | Admitting: Cardiovascular Disease

## 2024-02-08 VITALS — BP 128/66 | HR 103 | Ht 65.0 in | Wt 199.4 lb

## 2024-02-08 DIAGNOSIS — I2510 Atherosclerotic heart disease of native coronary artery without angina pectoris: Secondary | ICD-10-CM | POA: Diagnosis not present

## 2024-02-08 DIAGNOSIS — I10 Essential (primary) hypertension: Secondary | ICD-10-CM

## 2024-02-08 DIAGNOSIS — Z01812 Encounter for preprocedural laboratory examination: Secondary | ICD-10-CM | POA: Diagnosis not present

## 2024-02-08 NOTE — Progress Notes (Signed)
 Cardiology Office Note:  .   Date:  02/08/2024  ID:  [name], DOB 1966/08/15, MRN [id number] PCP: Hoy Register, MD  [facility] Providers Cardiologist:  Oboyle Ishikawa, MD Cardiology APP:  Manson Passey, [place] (Inactive)    History of Present Illness: .   [name] is a 58 y.o. female with CAD (NSTEMI 01/13/2024), HTN, poorly controlled DM2,  hyperlipidemia, history of stroke, returning in follow-up prior to planned high risk left main PCI scheduled for 02/10/2024.  Cardiac catheterization was performed 01/11/2024 showed severe multivessel CAD (70% ostial left main, 80% second diagonal, diffuse 50% stenosis mid LAD, 60% proximal-mid left circumflex stenosis upstream of previously placed stent which is widely patent, chronic total occlusion of overlapping right PDA stents).  Unfortunately diagnostic catheterization was complicated by 3 punctate strokes and significant alteration in mental status.  Left ventricular systolic function was normal but LVEDP was elevated at 35 mmHg.  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  CV surgery was consulted but she was felt to be a poor candidate for bypass surgery.  The current plan is for scheduled 02/10/2024 high risk PCI of the left main coronary artery stenosis, with a femoral approach and no sedation to avoid issues with altered mental status.  He has not had any angina since hospital discharge (described as "elephant sitting heavily on my chest"), but has had some very brief fleeting sharp pains across the anterior chest, at rest.  Denies dyspnea.  Has not had edema.  Since hospital discharge she has not had any new neurological events.  Still has weakness in her left lower extremity and has occasional muscle spasms.  She can get in and out of bed and take a few steps, but came to the office today in a wheelchair.  Studies Reviewed: .        Cardiac catheterization 01/11/2024  Recent Radiology  Findings:   CARDIAC CATHETERIZATION Result Date: 01/11/2024 Conclusions: Significant multivessel coronary artery disease, including eccentric 70% ostial LMCA stenosis (no improvement with intracoronary NTG), 80% ostial D2 stenosis, long segment of mid LAD disease of up to 50%, 60% proximal/mid LCx stenosis upstream from previously placed stent and chronic total occlusion of overlapping rPDA stents. Widely patent mid LCx stent. Chronically occluded rPDA stents. Normal left ventricular systolic function (LVEF 55-65%). Severely elevated left ventricular filling pressure (LVEF 35 mmHg). Challenging engagement of both coronary arteries due to radial artery vasospasm and unfavorable take-off of LMCA.  Consider alternative access for future catheterizations. Recommendations: Cardiac surgery consultation for CABG.  If the patient is not a candidate for CABG, high risk PCI to LMCA +/- LAD/D2 would need to be considered. Resume heparin infusion 2 hours after TR band deflation. Hold clopidogrel pending cardiac surgery consultation.  Defer initiation of aspirin given history of severe aspirin allergy. Escalate diuresis to furosemide 40 mg IV BID. Continue aggressive secondary prevention of coronary artery disease. Yvonne Kendall, MD [facility]  Diagnostic Dominance: Right       Risk Assessment/Calculations:             Physical Exam:   VS:  BP 128/66 (BP Location: Right Arm, Patient Position: Sitting, Cuff Size: Normal)   Pulse (!) 103   Ht 5\' 5"  (1.651 m)   Wt 199 lb 6.4 oz (90.4 kg)   LMP 01/24/2015 (Approximate)   SpO2 90%   BMI 33.18 kg/m    Wt Readings from Last 3 Encounters:  02/08/24 199 lb 6.4 oz (90.4  kg)  01/11/24 208 lb 12.4 oz (94.7 kg)  09/16/23 216 lb 3.2 oz (98.1 kg)    GEN: Well nourished, well developed in no acute distress NECK: No JVD; No carotid bruits CARDIAC: RRR, no murmurs, rubs, gallops RESPIRATORY:  Clear to auscultation without rales, wheezing or rhonchi  ABDOMEN:  Soft, non-tender, non-distended EXTREMITIES:  No edema; No deformity  NEURO: Mild paresis left lower extremity.  She is anxious  ASSESSMENT AND PLAN: .    58 year old woman with multivessel CAD (not candidate for CABG), recent CVA with residual mild left hemiparesis, poorly controlled diabetes mellitus, hypertension, hyperlipidemia, with plan for high risk PCI to left main coronary artery scheduled for 02/10/2024.  She has not restarted the losartan since her previous hospitalization.  Her blood pressure is in normal range.  She was a [place] tachycardic on arrival, but was quite anxious.  ECG does not show any meaningful changes.  Will hold furosemide and potassium supplements on the day of the procedure.  Labs will be drawn today.  Discussed the fact that this is a higher risk procedure compared to her previous angioplasty/stent procedures due to the location of the stenosis that will be treated.    Informed Consent   Shared Decision Making/Informed Consent The risks [stroke (1 in 1000), death (1 in 1000), kidney failure [usually temporary] (1 in 500), bleeding (1 in 200), allergic reaction [possibly serious] (1 in 200)], benefits (diagnostic support and management of coronary artery disease) and alternatives of a cardiac catheterization were discussed in detail with [name] and she is willing to proceed.       Signed, Thurmon Fair, MD

## 2024-02-08 NOTE — Patient Instructions (Addendum)
 Medication Instructions:  No changes *If you need a refill on your cardiac medications before your next appointment, please call your pharmacy*  Lab Work: CBC, BMP-today If you have labs (blood work) drawn today and your tests are completely normal, you will receive your results only by: MyChart Message (if you have MyChart) OR A paper copy in the mail If you have any lab test that is abnormal or we need to change your treatment, we will call you to review the results.  Testing/Procedures:  [place] A DEPT OF [place] AT [facility] [address] [place] [place] [address] Dept: [phone] Loc: [phone]  [name]  02/08/2024  You are scheduled for a Cardiac Catheterization on Wednesday, April 9 with Dr. Tonny Bollman.  1. Please arrive at the [place] (Main Entrance A) at [facility]: [address] [place], [place] [address] at 6:30 AM (This time is 2 hour(s) before your procedure to ensure your preparation).   Free valet parking service is available. You will check in at ADMITTING. The support person will be asked to wait in the waiting room.  It is OK to have someone drop you off and come back when you are ready to be discharged.    Special note: Every effort is made to have your procedure done on time. Please understand that emergencies sometimes delay scheduled procedures.  2. Diet: Do not eat solid foods after midnight.  The patient may have clear liquids until 5am upon the day of the procedure.  3. Labs: CBC, BMP  4. Medication instructions in preparation for your procedure:   Contrast Allergy: No  Do not take Furosemide, Potassium, or Insulin on the day of the procedure.   Take only 17 units of insulin the night before your procedure. Do not take any insulin on the day of the procedure.  On the morning of your procedure, take your Brilinta/Ticagrelor and any morning medicines  NOT listed above.  You may use sips of water.  5. Plan to go home the same day, you will only stay overnight if medically necessary. 6. Bring a current list of your medications and current insurance cards. 7. You MUST have a responsible person to drive you home. 8. Someone MUST be with you the first 24 hours after you arrive home or your discharge will be delayed. 9. Please wear clothes that are easy to get on and off and wear slip-on shoes.  Thank you for allowing [place] to care for you!   -- [place] [facility]   Follow-Up: At [facility], you and your health needs are our priority.  As part of our continuing mission to provide you with exceptional heart care, our providers are all part of one team.  This team includes your primary Cardiologist (physician) and Advanced Practice Providers or APPs (Physician Assistants and Nurse Practitioners) who all work together to provide you with the care you need, when you need it.  Your next appointment:   Follow up will be scheduled after the procedure.   We recommend signing up for the patient portal called "MyChart".  Sign up information is provided on this After Visit Summary.  MyChart is used to connect with patients for Virtual Visits (Telemedicine).  Patients are able to view lab/test results, encounter notes, upcoming appointments, etc.  Non-urgent messages can be sent to your provider as well.   To learn more about what you  can do with MyChart, go to [email or web address].        1st Floor: - Lobby - Registration  - Pharmacy  - Lab - Cafe  2nd Floor: - PV Lab - Diagnostic Testing (echo, CT, nuclear med)  3rd Floor: - Vacant  4th Floor: - TCTS (cardiothoracic surgery) - AFib Clinic - Structural Heart Clinic - Vascular Surgery  - Vascular Ultrasound  5th Floor: - HeartCare Cardiology (general and EP) - Clinical Pharmacy for coumadin, hypertension, lipid, weight-loss medications, and med  management appointments    Valet parking services will be available as well.

## 2024-02-09 ENCOUNTER — Encounter: Payer: Self-pay | Admitting: Cardiovascular Disease

## 2024-02-09 ENCOUNTER — Telehealth: Payer: Self-pay | Admitting: Emergency Medicine

## 2024-02-09 LAB — BASIC METABOLIC PANEL WITH GFR
BUN/Creatinine Ratio: 9 (ref 9–23)
BUN: 8 mg/dL (ref 6–24)
CO2: 22 mmol/L (ref 20–29)
Calcium: 9.3 mg/dL (ref 8.7–10.2)
Chloride: 94 mmol/L — ABNORMAL LOW (ref 96–106)
Creatinine, Ser: 0.94 mg/dL (ref 0.57–1.00)
Glucose: 591 mg/dL (ref 70–99)
Potassium: 3.5 mmol/L (ref 3.5–5.2)
Sodium: 137 mmol/L (ref 134–144)
eGFR: 70 mL/min/{1.73_m2} (ref >59)

## 2024-02-09 LAB — CBC
Hematocrit: 44.2 % (ref 34.0–46.6)
Hemoglobin: 15 g/dL (ref 11.1–15.9)
MCH: 33.1 pg — ABNORMAL HIGH (ref 26.6–33.0)
MCHC: 33.9 g/dL (ref 31.5–35.7)
MCV: 98 fL — ABNORMAL HIGH (ref 79–97)
Platelets: 289 10*3/uL (ref 150–450)
RBC: 4.53 x10E6/uL (ref 3.77–5.28)
RDW: 12 % (ref 11.7–15.4)
WBC: 8 10*3/uL (ref 3.4–10.8)

## 2024-02-09 NOTE — Telephone Encounter (Signed)
 Croitoru, Rachelle Hora, MD  Hoy Register, MD Cc: Tonny Bollman, MD; Jolyn Nap, RN; Scheryl Marten, RN For left main stent tomorrow, early case. Severely elevated blood glucose (not fasting), but all other labs are OK. Please drink plenty of water between now and the heart catheterization procedure tomorrow   No answer at either number. Left message to call back- left call back number. This is related to your recent lab work.   You can also look at your MyChart.

## 2024-02-10 ENCOUNTER — Encounter (HOSPITAL_COMMUNITY): Admission: RE | Payer: Self-pay | Source: Home / Self Care

## 2024-02-10 ENCOUNTER — Ambulatory Visit (HOSPITAL_COMMUNITY): Admission: RE | Admit: 2024-02-10 | Source: Home / Self Care | Admitting: Cardiovascular Disease

## 2024-02-10 SURGERY — CORONARY STENT INTERVENTION
Anesthesia: LOCAL

## 2024-02-10 NOTE — Pre-Procedure Instructions (Signed)
 Patient called admitting this am to cancel her case.  She said she had a family emergency this morning.

## 2024-02-11 NOTE — Telephone Encounter (Addendum)
 Coronary Stent Intervention 02/10/24 cancelled. Copied from 02/10/24 Secure Chat message from [place], RN [facility]: "she called hospital this morning, she said she had family emerency"

## 2024-02-12 ENCOUNTER — Other Ambulatory Visit (HOSPITAL_COMMUNITY): Payer: Self-pay

## 2024-02-12 ENCOUNTER — Other Ambulatory Visit: Payer: Self-pay

## 2024-02-17 ENCOUNTER — Telehealth: Payer: Self-pay | Admitting: Cardiovascular Disease

## 2024-02-17 NOTE — Telephone Encounter (Signed)
 Patient wants to reschedule her surgical procedure and wants a call back to discuss next steps.

## 2024-02-17 NOTE — Telephone Encounter (Signed)
 Patient called to reschedule  her PCi which ws cancelled by patient 02/10/24.   Patient states she can do any day except  02/25/24 for procedure  Patient is aware this message will be deferred to Dr Alvis Ba for  his response.   If procedure is reschedule - Would the interventional cardiologist still be D Arlester Ladd?

## 2024-02-17 NOTE — Telephone Encounter (Signed)
 Yes, please. Dr. Arlester Ladd

## 2024-02-18 ENCOUNTER — Telehealth: Payer: Self-pay

## 2024-02-18 NOTE — Telephone Encounter (Addendum)
 First available for Dr Arlester Ladd is Thursday Mar 03, 2024.  PCI has been scheduled for Thursday Mar 03, 2024 7:30 AM, arrive 5:30 AM. I need to speak with patient to confirm intentions to proceed with PCI, confirm date and review instructions.  I have been unable to reach patient at any numbers listed for her: [phone]-mailbox full, unable to leave message. [phone]-mailbox full, unable to leave message. [phone]-UJWJ message for patient to call back.  I was able to reach patient at [phone], confirmed date and time of procedure works for her. I reviewed procedure instructions with her over the telephone and will send via MyChart.        You are scheduled for a Coronary Stent Intervention on Thursday, May 1 with Dr. Arnoldo Lapping.  - Please arrive at the [facility] (Main Entrance A) at [facility]: [address] [place], [place] [address] at 5:30 AM (This time is 2 hour(s) before your procedure to ensure your preparation).   Free Do not take valet parking service is available. You will check in at ADMITTING. The support person will be asked to wait in the waiting room.  It is OK to have someone drop you off and come back when you are ready to be discharged.        Special note: Every effort is made to have your procedure done on time. Please understand that emergencies sometimes delay scheduled procedures.  -Diet: Do not eat solid foods after midnight.  You may have clear liquids until 5 AM the day of the procedure.  - Medication instructions:   Do not take Insulin the morning of the procedure. Take 1/2 usual dose of Insulin the night before procedure.   Do not take  Lasix (furosemide) or Klor-Con (potassium supplement) the morning of the procedure.   On the morning of your procedure, take Brilinta (ticagrelor) 90 mg and any morning medicines NOT listed above.  You may use sips of water.  - Plan to go home the same day, you will only stay overnight if medically  necessary. -You MUST have a responsible adult to drive you home. - An adult MUST be with you the first 24 hours after you arrive home. -Bring a current list of your medications, and the last time and date medication taken. - Bring ID and current insurance cards. -.Please wear clothes that are easy to get on and off and wear slip-on shoes.  Thank you for allowing us  to care for you!   -- [place] [facility]

## 2024-02-18 NOTE — Telephone Encounter (Signed)
 Signed orders for home health efaxed to [facility]

## 2024-02-18 NOTE — Telephone Encounter (Addendum)
         You are scheduled for a Coronary Stent Intervention on Thursday, May 1 with Dr. Arnoldo Lapping.   - Please arrive at the [facility] (Main Entrance A) at [facility]: [address] [place], [place] [address] at 5:30 AM (This time is 2 hour(s) before your procedure to ensure your preparation).    Free Do not take valet parking service is available. You will check in at ADMITTING. The support person will be asked to wait in the waiting room.  It is OK to have someone drop you off and come back when you are ready to be discharged.         Special note: Every effort is made to have your procedure done on time. Please understand that emergencies sometimes delay scheduled procedures.   -Diet: Do not eat solid foods after midnight.  You may have clear liquids until 5 AM the day of the procedure.   - Medication instructions:   Do not take Insulin the morning of the procedure. Take 1/2 usual dose of Insulin the night before procedure.   Do not take  Lasix (furosemide) or Klor-Con (potassium supplement) the morning of the procedure.   On the morning of your procedure, take Brilinta (ticagrelor) 90 mg and any morning medicines NOT listed above.  You may use sips of water.   - Plan to go home the same day, you will only stay overnight if medically necessary. -You MUST have a responsible adult to drive you home. - An adult MUST be with you the first 24 hours after you arrive home. -Bring a current list of your medications, and the last time and date medication taken. - Bring ID and current insurance cards. -.Please wear clothes that are easy to get on and off and wear slip-on shoes.   Thank you for allowing us  to care for you!   -- [place] [facility]

## 2024-03-01 ENCOUNTER — Telehealth: Payer: Self-pay | Admitting: *Deleted

## 2024-03-01 NOTE — Telephone Encounter (Signed)
 Coronary Stent  scheduled at [facility] for: Thursday Mar 03, 2024 7:30 AM Arrival time [facility] Main Entrance A at: 5:30 AM  Nothing to eat after midnight prior to procedure, clear liquids until 5 AM day of procedure.  Medication instructions: -Hold:  Insulin -AM of procedure/1/2 usual Insulin  dose HS prior to procedure  Lasix /KCl-AM of procedure -Other usual morning medications can be taken with sips of water including Brilinta  90 mg  Plan to go home the same day, you will only stay overnight if medically necessary.  You must have responsible adult to drive you home.  Someone must be with you the first 24 hours after you arrive home.  Reviewed procedure instructions with patient.

## 2024-03-02 ENCOUNTER — Telehealth: Payer: Self-pay | Admitting: Emergency Medicine

## 2024-03-02 DIAGNOSIS — I2510 Atherosclerotic heart disease of native coronary artery without angina pectoris: Secondary | ICD-10-CM

## 2024-03-02 NOTE — Telephone Encounter (Signed)
 Called patient- ID X [id number]- gave her the information from previous message. She verbalized understanding.  Will send an urgent Cardiology Referral to an [facility] Provider.   Informed her that I will send her the information through MyChart about the provider and office that the referral is sent to.   (Helped her get back into her MyChart so she can receive/see information)

## 2024-03-02 NOTE — Addendum Note (Signed)
 Addended by: Jovonda Selner L on: 03/02/2024 05:23 PM   Modules accepted: Orders

## 2024-03-02 NOTE — Telephone Encounter (Addendum)
 Information received from our PreCert department:  AH-  Good afternoon. we will need to cancel her stent that is scheduled for tomorrow. I have obtained an authorization for her [employer], however, [employer] will not honor a single case agreement. We will have to send her to an [facility] facility.   2:47 PM Ok, do I need to contact the patient and cath lab? And I am unsure how it works with scheduling at another facility.   2:50 PM AH Amanda Hepler Yes, the patient would need to be made aware. I can call the cath lab.  2:50 PM Thank you. And do you know which facility we need to refer to- does she need to call and talk with her insurance company to ask about preferred facilities  2:52 PM AH Pedro Bourgeois They offered me an Geologist, engineering in [place], [place]. I looked at her insurance and the closest [facility]ologist would be [facility] in [place].   2:56 PM MC Luana Rumple, MD I cannot schedule at an [facility] facility. She will need to see an [facility] Cardiologist to set it up.  2:57 PM AH [employer] Yes. I am sorry. I meant to say an [facility] Physician not facility. The nearest [facility] Cardiologist is above.

## 2024-03-03 ENCOUNTER — Ambulatory Visit (HOSPITAL_COMMUNITY): Admission: RE | Admit: 2024-03-03 | Source: Home / Self Care | Admitting: Cardiovascular Disease

## 2024-03-03 ENCOUNTER — Encounter (HOSPITAL_COMMUNITY): Admission: RE | Payer: Self-pay | Source: Home / Self Care

## 2024-03-03 ENCOUNTER — Ambulatory Visit: Admitting: Neurology

## 2024-03-03 ENCOUNTER — Encounter: Payer: Self-pay | Admitting: Neurology

## 2024-03-03 VITALS — BP 154/96 | HR 77 | Ht 65.0 in | Wt 194.0 lb

## 2024-03-03 DIAGNOSIS — G3184 Mild cognitive impairment, so stated: Secondary | ICD-10-CM

## 2024-03-03 DIAGNOSIS — I6310 Cerebral infarction due to embolism of unspecified precerebral artery: Secondary | ICD-10-CM | POA: Diagnosis not present

## 2024-03-03 SURGERY — CORONARY STENT INTERVENTION
Anesthesia: LOCAL

## 2024-03-03 NOTE — Patient Instructions (Signed)
 I had a long d/w patient about her recent embolic strokes following cardiac catheterization and mild cognitive impairment,, risk for recurrent stroke/TIAs, personally independently reviewed imaging studies and stroke evaluation results and answered questions.Continue Brilinta  (ticagrelor ) 90 mg bid  for secondary stroke prevention as she is allergic to aspirin  and maintain strict control of hypertension with blood pressure goal below 130/90, diabetes with hemoglobin A1c goal below 6.5% and lipids with LDL cholesterol goal below 70 mg/dL. I also advised the patient to eat a healthy diet with plenty of whole grains, cereals, fruits and vegetables, exercise regularly and maintain ideal body weight.  I recommend she increase participation in cognitively challenging activities like solving crossword puzzles, playing bridge and sudoku.  We also discussed memory compensation strategies.  Followup in the future with my nurse practitioner in 6 months or call earlier if necessary.  Memory Compensation Strategies  Use "WARM" strategy.  W= write it down  A= associate it  R= repeat it  M= make a mental note  2.   You can keep a Glass blower/designer.  Use a 3-ring notebook with sections for the following: calendar, important names and phone numbers,  medications, doctors' names/phone numbers, lists/reminders, and a section to journal what you did  each day.   3.    Use a calendar to write appointments down.  4.    Write yourself a schedule for the day.  This can be placed on the calendar or in a separate section of the Memory Notebook.  Keeping a  regular schedule can help memory.  5.    Use medication organizer with sections for each day or morning/evening pills.  You may need help loading it  6.    Keep a basket, or pegboard by the door.  Place items that you need to take out with you in the basket or on the pegboard.  You may also want to  include a message board for reminders.  7.    Use sticky  notes.  Place sticky notes with reminders in a place where the task is performed.  For example: " turn off the  stove" placed by the stove, "lock the door" placed on the door at eye level, " take your medications" on  the bathroom mirror or by the place where you normally take your medications.  8.    Use alarms/timers.  Use while cooking to remind yourself to check on food or as a reminder to take your medicine, or as a  reminder to make a call, or as a reminder to perform another task, etc.

## 2024-03-03 NOTE — Progress Notes (Signed)
 [facility] [address] [place]. [place] [address] [phone]       OFFICE FOLLOW-UP NOTE  [name] Date of Birth:  Jan 02, 1966 Medical Record Number:  [id number]   HPI: [name] is a 58 year old lady seen today for initial office follow-up visit following hospital consultation for strokes.  History is obtained from the [name] and review of electronic medical records and I personally reviewed pertinent available imaging films in PACS.  [name] has hx of CAD, s/p PCI to Lcx and PCA,, PDA stent s/p DES, DVT, DM2, Obesity, HTN, HLD, CVA s/p cath, chest pain 01/08/24 with diaphoresis and SOB, progressive chest pain who presented today for cardiac cath.  Inpatient code stroke was called due to confusion. [name] was also noted to be c/o headache behind [name] left eye after the procedure. On neurology exam, [name] was drowsy, confused and perseverated in [name] responses, globally aphasic, mild left facial droop at rest, questionable RLE weakness.  CT head showed no acute abnormality.  [name] was several hours out from a cardiac cath procedure and TNK was offered but daughter declined after discussion of risk benefits and alternatives.  CT angiogram was negative for large vessel occlusion.  NIH stroke scale was 9.  MRI scan confirmed tiny punctate infarcts in bilateral cerebral hemispheres and the right cerebellum.  [name] neurological exam improved.  2D echo showed ejection fraction of 70 to 75%.  LDL cholesterol elevated at 135 mg percent.  Hemoglobin A1c was 12.3.  [name] was on clopidogrel  prior to admission and since [name] was allergic to aspirin  this was switched to Brilinta .  [name] did have prior history of similar by cerebral infarcts following cardiac catheterization on 03/30/2022 and MRI at that time had shown small bilateral cerebral cortex and right thalamic infarcts.  [name] had residual left leg weakness from this which was persistent.  [name] states [name] is doing well from  discharge.  Confusion has improved but [name] still has some occasional garbled speech for a few minutes without any obvious triggers.  [name] also gets disoriented at times.  [name] feels [name] short-term memory is poor and [name] mind goes blank at times.  [name] remains on Trintellix tolerating well without bleeding but does bruise easily.  [name] is tolerating Crestor  40 mg daily without muscle aches and pains.  [name] does have upcoming visit with primary care physician and will likely have follow-up lipid profile checked. ROS:   14 system review of systems is positive for memory difficulties, speech difficulties, leg weakness, difficulty walking, disorientation all other systems negative  PMH:  Past Medical History:  Diagnosis Date   Anxiety    CAD S/P percutaneous coronary angioplasty 03/29/2022   Cath-PCI 03/2022:   RPDA-1 lesion is 40% stenosed. RPDA-2 stent is 99% re-stenosed (DES PCI Synergy DX 2.5 x 24 => 0%);   2nd Diag lesion is 80% stenosed => Med Rx;   Previously placed Mid Cx stent  widely patent.;   LV end diastolic pressure is normal.     Intervention: PTCA/DES PCI of PDA ISR: 2.5 x 24 mm Synergy XD stent                                      Diabetes mellitus    Embolism (HCC)    Hypertension    NSTEMI (non-ST elevated myocardial infarction) (HCC) 03/29/2022   Sarcoidosis     Social History:  Social  History   Socioeconomic History   Marital status: Legally Separated    Spouse name: Not on file   Number of children: Not on file   Years of education: Not on file   Highest education level: Not on file  Occupational History   Not on file  Tobacco Use   Smoking status: Never   Smokeless tobacco: Never  Vaping Use   Vaping status: Never Used  Substance and Sexual Activity   Alcohol use: Yes    Comment: occasional   Drug use: No   Sexual activity: Not Currently    Partners: Male    Birth control/protection: Surgical  Other Topics Concern   Not on file  Social History Narrative   Not on  file   Social Drivers of Health   Financial Resource Strain: High Risk (09/21/2023)   Overall Financial Resource Strain (CARDIA)    Difficulty of Paying Living Expenses: Very hard  Food Insecurity: Food Insecurity Present (01/12/2024)   Hunger Vital Sign    Worried About Running Out of Food in the Last Year: Never true    Ran Out of Food in the Last Year: Sometimes true  Transportation Needs: Unmet Transportation Needs (01/12/2024)   PRAPARE - Administrator, Civil Service (Medical): Yes    Lack of Transportation (Non-Medical): No  Physical Activity: Not on file  Stress: Stress Concern Present (09/21/2023)   [employer] of Occupational Health - Occupational Stress Questionnaire    Feeling of Stress : Very much  Social Connections: Not on file  Intimate Partner Violence: [name] Declined (01/12/2024)   Humiliation, Afraid, Rape, and Kick questionnaire    Fear of Current or Ex-Partner: [name] declined    Emotionally Abused: [name] declined    Physically Abused: [name] declined    Sexually Abused: [name] declined    Medications:   Current Outpatient Medications on File Prior to Visit  Medication Sig Dispense Refill   Blood Glucose Monitoring Suppl (TRUE METRIX METER) w/Device KIT use as directed three times a day 1 kit 0   carvedilol  (COREG ) 3.125 MG tablet Take 1 tablet (3.125 mg total) by mouth 2 (two) times daily with a meal. 60 tablet 1   DULoxetine  (CYMBALTA ) 60 MG capsule Take 1 capsule (60 mg total) by mouth daily. 90 capsule 1   ezetimibe  (ZETIA ) 10 MG tablet Take 1 tablet (10 mg total) by mouth daily. 30 tablet 1   famotidine  (PEPCID ) 20 MG tablet TAKE 1 TABLET BY MOUTH TWICE DAILY 180 tablet 1   furosemide  (LASIX ) 40 MG tablet Take 1 tablet (40 mg total) by mouth daily. 30 tablet 1   glucose blood (ACCU-CHEK GUIDE) test strip Use as instructed to check blood sugar three times daily. 100 each 6   insulin  glargine (LANTUS  SOLOSTAR) 100 UNIT/ML Solostar Pen  Inject 35 Units into the skin 2 (two) times daily. 30 mL 3   Insulin  Pen Needle (B-D UF III MINI PEN NEEDLES) 31G X 5 MM MISC USE AS DIRECTED 2 TIMES A DAY 100 each 5   isosorbide  mononitrate (IMDUR ) 60 MG 24 hr tablet Take 1 tablet (60 mg total) by mouth daily. 90 tablet 1   nitroGLYCERIN  (NITROSTAT ) 0.4 MG SL tablet PLACE 1 TABLET UNDER THE TOUNGE EVERY 5 MINS  AS NEEDED FOR CHEST PAIN 100 tablet 0   potassium chloride  (KLOR-CON  M) 10 MEQ tablet Take 2 tablets (20 mEq total) by mouth daily. 60 tablet 1   rosuvastatin  (CRESTOR ) 40 MG tablet Take 1  tablet (40 mg total) by mouth daily. 30 tablet 3   ticagrelor  (BRILINTA ) 90 MG TABS tablet Take 1 tablet (90 mg total) by mouth 2 (two) times daily. 60 tablet 3   TRUEplus Lancets 28G MISC use as directed mto test blood sugars 3 times a day 100 each 6   [DISCONTINUED] lisinopril  (ZESTRIL ) 20 MG tablet Take 1 tablet (20 mg total) by mouth daily. 30 tablet 5   [DISCONTINUED] omeprazole  (PRILOSEC) 40 MG capsule Take 1 capsule (40 mg total) by mouth daily. 30 capsule 0   No current facility-administered medications on file prior to visit.    Allergies:   Allergies  Allergen Reactions   Aspirin  Anaphylaxis and Swelling    Tongue swells   Tramadol Hives   Hydrocodone  Rash   Ibuprofen  Itching   Morphine  And Codeine Itching   Orange Fruit [Citrus] Rash    Physical Exam General: well developed, well nourished pleasant middle-age lady, seated, in no evident distress Head: head normocephalic and atraumatic.  Neck: supple with no carotid or supraclavicular bruits Cardiovascular: regular rate and rhythm, no murmurs Musculoskeletal: no deformity Skin:  no rash/petichiae Vascular:  Normal pulses all extremities Vitals:   03/03/24 0856  BP: (!) 154/96  Pulse: 77   Neurologic Exam Mental Status: Awake and fully alert. Oriented to place and time. Recent and remote memory intact. Attention span, concentration and fund of knowledge appropriate. Mood  and affect appropriate.  Diminished recall 2/3.  Able to name 10 animals which can walk on 4 legs.  Clock drawing 4/4. Cranial Nerves: Fundoscopic exam reveals sharp disc margins. Pupils equal, briskly reactive to light. Extraocular movements full without nystagmus. Visual fields full to confrontation. Hearing intact. Facial sensation intact.  Mild left lower facial asymmetry, tongue, palate moves normally and symmetrically.  Motor: Mild left hemiparesis with 4+/5 left upper extremity strength with weakness of left grip and intrinsic hand muscles.  4/5 left lower extremity strength with left foot drop and 3/5 distal weakness and 4+/5 proximal hip and knee weakness. Sensory.: intact to touch ,pinprick .position and vibratory sensation.  Coordination: Rapid alternating movements normal in all extremities. Finger-to-nose and heel-to-shin performed accurately bilaterally. Gait and Station: Arises from chair without difficulty. Stance is normal. Gait demonstrates dragging of the left leg with left foot drop. Reflexes: 1+ and asymmetric and brisker on the left. Toes downgoing.   NIHSS  2 Modified Rankin  2   ASSESSMENT: 58 year old Caucasian lady with small embolic infarcts following cardiac catheterization procedure in March 2025 who is doing well but has mild cognitive impairment post strokes.  Vascular risk factors of coronary artery disease, diabetes, hypertension, hyperlipidemia and prior stroke.  [name] also has mild cognitive impairment likely from his strokes    PLAN:I had a long d/w [name] about [name] recent embolic strokes following cardiac catheterization and mild cognitive impairment,, risk for recurrent stroke/TIAs, personally independently reviewed imaging studies and stroke evaluation results and answered questions.Continue Brilinta  (ticagrelor ) 90 mg bid  for secondary stroke prevention as [name] is allergic to aspirin  and maintain strict control of hypertension with blood pressure goal below  130/90, diabetes with hemoglobin A1c goal below 6.5% and lipids with LDL cholesterol goal below 70 mg/dL. I also advised the [name] to eat a healthy diet with plenty of whole grains, cereals, fruits and vegetables, exercise regularly and maintain ideal body weight.  I recommend [name] increase participation in cognitively challenging activities like solving crossword puzzles, playing bridge and sudoku.  We also discussed memory compensation strategies.  Followup in the future with my nurse practitioner in 6 months or call earlier if necessary.  Greater than 50% of time during this 35 minute visit was spent on counseling,explanation of diagnosis, planning of further management, discussion with [name] and family and coordination of care Ardella Beaver, MD Note: This document was prepared with digital dictation and possible smart phrase technology. Any transcriptional errors that result from this process are unintentional

## 2024-03-16 ENCOUNTER — Ambulatory Visit: Admitting: Neurology

## 2024-03-29 ENCOUNTER — Telehealth: Payer: Self-pay | Admitting: Family Medicine

## 2024-03-29 DIAGNOSIS — F419 Anxiety disorder, unspecified: Secondary | ICD-10-CM

## 2024-03-29 NOTE — Telephone Encounter (Unsigned)
 Copied from CRM [id number]. Topic: Clinical - Medication Refill >> Mar 29, 2024 11:40 AM [facility] T wrote: Medication: ezetimibe  (ZETIA ) 10 MG tablet potassium chloride  (KLOR-CON  M) 10 MEQ tablet rosuvastatin  (CRESTOR ) 40 MG tablet ticagrelor  (BRILINTA ) 90 MG TABS tablet furosemide  (LASIX ) 40 MG tablet DULoxetine  (CYMBALTA ) 60 MG capsule  insulin  glargine (LANTUS  SOLOSTAR) 100 UNIT/ML Solostar Pen  Has the patient contacted their pharmacy? No (Agent: If no, request that the patient contact the pharmacy for the refill. If patient does not wish to contact the pharmacy document the reason why and proceed with request.) (Agent: If yes, when and what did the pharmacy advise?)  This is the patient's preferred pharmacy:  [facility] [address] [place] [address] Phone: [phone] Fax: [phone]  Is this the correct pharmacy for this prescription? Yes If no, delete pharmacy and type the correct one.   Has the prescription been filled recently? Yes  Is the patient out of the medication? No  Has the patient been seen for an appointment in the last year OR does the patient have an upcoming appointment? Yes  Can we respond through MyChart? Yes  Agent: Please be advised that Rx refills may take up to 3 business days. We ask that you follow-up with your pharmacy.

## 2024-03-31 ENCOUNTER — Other Ambulatory Visit: Payer: Self-pay

## 2024-03-31 MED ORDER — DULOXETINE HCL 60 MG PO CPEP
60.0000 mg | ORAL_CAPSULE | Freq: Every day | ORAL | 0 refills | Status: DC
  Filled 2024-03-31: qty 90, 90d supply, fill #0

## 2024-03-31 MED ORDER — FUROSEMIDE 40 MG PO TABS
40.0000 mg | ORAL_TABLET | Freq: Every day | ORAL | 0 refills | Status: AC
  Filled 2024-03-31: qty 90, 90d supply, fill #0

## 2024-03-31 NOTE — Telephone Encounter (Signed)
 Requested Prescriptions  Pending Prescriptions Disp Refills   ezetimibe  (ZETIA ) 10 MG tablet 30 tablet 1    Sig: Take 1 tablet (10 mg total) by mouth daily.     Cardiovascular:  Antilipid - Sterol Transport Inhibitors Failed - 03/31/2024  4:43 PM      Failed - AST in normal range and within 360 days    AST  Date Value Ref Range Status  11/12/2022 25 15 - 41 U/L Final         Failed - ALT in normal range and within 360 days    ALT  Date Value Ref Range Status  11/12/2022 22 0 - 44 U/L Final         Failed - Lipid Panel in normal range within the last 12 months    Cholesterol, Total  Date Value Ref Range Status  10/08/2022 149 100 - 199 mg/dL Final   Cholesterol  Date Value Ref Range Status  01/10/2024 249 (H) 0 - 200 mg/dL Final   LDL Chol Calc (NIH)  Date Value Ref Range Status  10/08/2022 86 0 - 99 mg/dL Final   LDL Cholesterol  Date Value Ref Range Status  01/10/2024 135 (H) 0 - 99 mg/dL Final    Comment:           Total Cholesterol/HDL:CHD Risk Coronary Heart Disease Risk Table                     Men   Women  1/2 Average Risk   3.4   3.3  Average Risk       5.0   4.4  2 X Average Risk   9.6   7.1  3 X Average Risk  23.4   11.0        Use the calculated Patient Ratio above and the CHD Risk Table to determine the patient's CHD Risk.        ATP III CLASSIFICATION (LDL):  <100     mg/dL   Optimal  409-811  mg/dL   Near or Above                    Optimal  130-159  mg/dL   Borderline  914-782  mg/dL   High  >956     mg/dL   Very High Performed at [facility], [address], [place], [place] [address]    HDL  Date Value Ref Range Status  01/10/2024 42 >40 mg/dL Final  65/78/4696 47 >29 mg/dL Final   Triglycerides  Date Value Ref Range Status  01/10/2024 361 (H) <150 mg/dL Final         Passed - Patient is not pregnant      Passed - Valid encounter within last 12 months    Recent Outpatient Visits           3 months ago NSTEMI (non-ST  elevated myocardial infarction) (HCC)   [facility] [place] [facility] Dept Of [place]. [facility] Joaquin Mulberry, MD   6 months ago Type 2 diabetes mellitus with other circulatory complication, without long-term current use of insulin  [facility])   [place] [facility] - A Dept Of [place]. [facility] Joaquin Mulberry, MD   9 months ago NSTEMI (non-ST elevated myocardial infarction) [facility])   [place] [facility] - [facility] Dept Of [place]. [facility] Joaquin Mulberry, MD   1 year ago Annual visit  for general adult medical examination with abnormal findings   [place] [facility] - A Dept Of [place]. [facility] Joaquin Mulberry, MD   1 year ago Type 2 diabetes mellitus with other circulatory complication, without long-term current use of insulin  [facility])   [place] [facility] - A Dept Of [place]. [facility] Adan Holms, Lavelle Posey, MD               potassium chloride  (KLOR-CON  M) 10 MEQ tablet 60 tablet 1    Sig: Take 2 tablets (20 mEq total) by mouth daily.     Endocrinology:  Minerals - Potassium Supplementation Passed - 03/31/2024  4:43 PM      Passed - K in normal range and within 360 days    Potassium  Date Value Ref Range Status  02/08/2024 3.5 3.5 - 5.2 mmol/L Final         Passed - Cr in normal range and within 360 days    Creatinine, Ser  Date Value Ref Range Status  02/08/2024 0.94 0.57 - 1.00 mg/dL Final         Passed - Valid encounter within last 12 months    Recent Outpatient Visits           3 months ago NSTEMI (non-ST elevated myocardial infarction) [facility])   [place] [facility] - [facility] Dept Of [place]. [facility] Joaquin Mulberry, MD   6 months ago Type 2 diabetes mellitus with other circulatory complication, without long-term current use of insulin  [facility])   [place] [facility] - A Dept Of [place]. [facility] Joaquin Mulberry, MD   9 months  ago NSTEMI (non-ST elevated myocardial infarction) [facility])   [place] [facility] - A Dept Of [place]. [facility] Joaquin Mulberry, MD   1 year ago Annual visit for general adult medical examination with abnormal findings   [place] [facility] - [facility] Dept Of [place]. [facility] Joaquin Mulberry, MD   1 year ago Type 2 diabetes mellitus with other circulatory complication, without long-term current use of insulin  [facility])   [place] [facility] - A Dept Of [place]. [facility] Joaquin Mulberry, MD               DULoxetine  (CYMBALTA ) 60 MG capsule 90 capsule 0    Sig: Take 1 capsule (60 mg total) by mouth daily.     Psychiatry: Antidepressants - SNRI - duloxetine  Failed - 03/31/2024  4:43 PM      Failed - Completed PHQ-2 or PHQ-9 in the last 360 days      Failed - Last BP in normal range    BP Readings from Last 1 Encounters:  03/03/24 (!) 154/96         Passed - Cr in normal range and within 360 days    Creatinine, Ser  Date Value Ref Range Status  02/08/2024 0.94 0.57 - 1.00 mg/dL Final         Passed - eGFR is 30 or above and within 360 days    GFR calc Af Amer  Date Value Ref Range Status  10/30/2020 93 >59 mL/min/1.73 Final    Comment:    **In accordance with recommendations from the NKF-ASN Task force,**   [facility] is in the process of updating its eGFR calculation to the   2021 CKD-EPI creatinine equation that estimates kidney function  without a race variable.    GFR, Estimated  Date Value Ref Range Status  01/13/2024 59 (L) >60 mL/min Final    Comment:    (NOTE) Calculated using the CKD-EPI Creatinine Equation (2021)    eGFR  Date Value Ref Range Status  02/08/2024 70 >59 mL/min/1.73 Final         Passed - Valid encounter within last 6 months    Recent Outpatient Visits           3 months ago NSTEMI (non-ST elevated myocardial infarction) [facility])   [place] [facility] - [facility] Dept Of  [place]. [facility] Joaquin Mulberry, MD   6 months ago Type 2 diabetes mellitus with other circulatory complication, without long-term current use of insulin  [facility])   [place] [facility] - A Dept Of [place]. [facility] Joaquin Mulberry, MD   9 months ago NSTEMI (non-ST elevated myocardial infarction) [facility])   [place] [facility] - A Dept Of [place]. [facility] Joaquin Mulberry, MD   1 year ago Annual visit for general adult medical examination with abnormal findings   [place] [facility] - [facility] Dept Of [place]. [facility] Joaquin Mulberry, MD   1 year ago Type 2 diabetes mellitus with other circulatory complication, without long-term current use of insulin  [facility])   [place] [facility] - A Dept Of [place]. [facility] Adan Holms, Lavelle Posey, MD               furosemide  (LASIX ) 40 MG tablet 90 tablet 0    Sig: Take 1 tablet (40 mg total) by mouth daily.     Cardiovascular:  Diuretics - Loop Failed - 03/31/2024  4:43 PM      Failed - Cl in normal range and within 180 days    Chloride  Date Value Ref Range Status  02/08/2024 94 (L) 96 - 106 mmol/L Final         Failed - Last BP in normal range    BP Readings from Last 1 Encounters:  03/03/24 (!) 154/96         Passed - K in normal range and within 180 days    Potassium  Date Value Ref Range Status  02/08/2024 3.5 3.5 - 5.2 mmol/L Final         Passed - Ca in normal range and within 180 days    Calcium   Date Value Ref Range Status  02/08/2024 9.3 8.7 - 10.2 mg/dL Final   Calcium , Ion  Date Value Ref Range Status  11/13/2022 1.14 (L) 1.15 - 1.40 mmol/L Final         Passed - Na in normal range and within 180 days    Sodium  Date Value Ref Range Status  02/08/2024 137 134 - 144 mmol/L Final         Passed - Cr in normal range and within 180 days    Creatinine, Ser  Date Value Ref Range Status  02/08/2024 0.94 0.57 - 1.00 mg/dL  Final         Passed - Mg Level in normal range and within 180 days    Magnesium   Date Value Ref Range Status  01/13/2024 1.8 1.7 - 2.4 mg/dL Final    Comment:    Performed at [facility], [address] [place], [place] [address]         Passed -  Valid encounter within last 6 months    Recent Outpatient Visits           3 months ago NSTEMI (non-ST elevated myocardial infarction) [facility])   [place] [facility] Dept Of [place]. [facility] Joaquin Mulberry, MD   6 months ago Type 2 diabetes mellitus with other circulatory complication, without long-term current use of insulin  [facility])   [place] [facility] - A Dept Of [place]. [facility] Joaquin Mulberry, MD   9 months ago NSTEMI (non-ST elevated myocardial infarction) [facility])   [place] [facility] - A Dept Of [place]. [facility] Joaquin Mulberry, MD   1 year ago Annual visit for general adult medical examination with abnormal findings   [facility] [place] [facility] - A Dept Of [place]. [facility] Joaquin Mulberry, MD   1 year ago Type 2 diabetes mellitus with other circulatory complication, without long-term current use of insulin  [facility])   [place] [facility] - A Dept Of . [facility] Joaquin Mulberry, MD              Refused Prescriptions Disp Refills   rosuvastatin  (CRESTOR ) 40 MG tablet 30 tablet 3    Sig: Take 1 tablet (40 mg total) by mouth daily.     Cardiovascular:  Antilipid - Statins 2 Failed - 03/31/2024  4:43 PM      Failed - Lipid Panel in normal range within the last 12 months    Cholesterol, Total  Date Value Ref Range Status  10/08/2022 149 100 - 199 mg/dL Final   Cholesterol  Date Value Ref Range Status  01/10/2024 249 (H) 0 - 200 mg/dL Final   LDL Chol Calc (NIH)  Date Value Ref Range Status  10/08/2022 86 0 - 99 mg/dL Final   LDL Cholesterol  Date Value Ref Range Status  01/10/2024 135  (H) 0 - 99 mg/dL Final    Comment:           Total Cholesterol/HDL:CHD Risk Coronary Heart Disease Risk Table                     Men   Women  1/2 Average Risk   3.4   3.3  Average Risk       5.0   4.4  2 X Average Risk   9.6   7.1  3 X Average Risk  23.4   11.0        Use the calculated Patient Ratio above and the CHD Risk Table to determine the patient's CHD Risk.        ATP III CLASSIFICATION (LDL):  <100     mg/dL   Optimal  161-096  mg/dL   Near or Above                    Optimal  130-159  mg/dL   Borderline  045-409  mg/dL   High  >811     mg/dL   Very High Performed at [facility], [address], [place], [place] [address]    HDL  Date Value Ref Range Status  01/10/2024 42 >40 mg/dL Final  29/56/2130 47 >86 mg/dL Final   Triglycerides  Date Value Ref Range Status  01/10/2024 361 (H) <150 mg/dL Final         Passed - Cr in normal range and within 360 days  Creatinine, Ser  Date Value Ref Range Status  02/08/2024 0.94 0.57 - 1.00 mg/dL Final         Passed - Patient is not pregnant      Passed - Valid encounter within last 12 months    Recent Outpatient Visits           3 months ago NSTEMI (non-ST elevated myocardial infarction) (HCC)   [place] [facility] - A Dept Of [place]. [facility] Joaquin Mulberry, MD   6 months ago Type 2 diabetes mellitus with other circulatory complication, without long-term current use of insulin  [facility])   [place] [facility] - A Dept Of [place]. [facility] Joaquin Mulberry, MD   9 months ago NSTEMI (non-ST elevated myocardial infarction) [facility])   [place] [facility] - A Dept Of [place]. [facility] Joaquin Mulberry, MD   1 year ago Annual visit for general adult medical examination with abnormal findings   [place] [facility] - A Dept Of [place]. [facility] Joaquin Mulberry, MD   1 year ago Type 2 diabetes mellitus with other  circulatory complication, without long-term current use of insulin  [facility])   [place] [facility] - A Dept Of [place]. [facility] Adan Holms, Lavelle Posey, MD               ticagrelor  (BRILINTA ) 90 MG TABS tablet 60 tablet 3    Sig: Take 1 tablet (90 mg total) by mouth 2 (two) times daily.     Hematology: Antiplatelets - ticagrelor  Passed - 03/31/2024  4:43 PM      Passed - Cr in normal range and within 180 days    Creatinine, Ser  Date Value Ref Range Status  02/08/2024 0.94 0.57 - 1.00 mg/dL Final         Passed - HCT in normal range and within 180 days    Hematocrit  Date Value Ref Range Status  02/08/2024 44.2 34.0 - 46.6 % Final         Passed - HGB in normal range and within 180 days    Hemoglobin  Date Value Ref Range Status  02/08/2024 15.0 11.1 - 15.9 g/dL Final         Passed - PLT in normal range and within 180 days    Platelets  Date Value Ref Range Status  02/08/2024 289 150 - 450 x10E3/uL Final         Passed - Last Heart Rate in normal range    Pulse Readings from Last 1 Encounters:  03/03/24 77         Passed - Valid encounter within last 6 months    Recent Outpatient Visits           3 months ago NSTEMI (non-ST elevated myocardial infarction) (HCC)   [place] [facility] - A Dept Of [place]. [facility] Joaquin Mulberry, MD   6 months ago Type 2 diabetes mellitus with other circulatory complication, without long-term current use of insulin  [facility])   [place] [facility] - A Dept Of [place]. [facility] Joaquin Mulberry, MD   9 months ago NSTEMI (non-ST elevated myocardial infarction) [facility])   [place] [facility] - A Dept Of [place]. [facility] Joaquin Mulberry, MD   1 year ago Annual visit for general adult medical examination with abnormal findings   [facility] [place] - A Dept Of [place]. [facility] Joaquin Mulberry, MD   1 year ago Type 2 diabetes  mellitus with other circulatory complication, without long-term current use of insulin  [facility])   [place] [facility] - A Dept Of [place]. [facility] [place], Lavelle Posey, MD               insulin  glargine (LANTUS  SOLOSTAR) 100 UNIT/ML Solostar Pen 30 mL 3    Sig: Inject 35 Units into the skin 2 (two) times daily.     Endocrinology:  Diabetes - Insulins Failed - 03/31/2024  4:43 PM      Failed - HBA1C is between 0 and 7.9 and within 180 days    HbA1c, POC (controlled diabetic range)  Date Value Ref Range Status  09/16/2023 12.4 (A) 0.0 - 7.0 % Final   Hgb A1c MFr Bld  Date Value Ref Range Status  01/10/2024 12.3 (H) 4.8 - 5.6 % Final    Comment:    (NOTE) Pre diabetes:          5.7%-6.4%  Diabetes:              >6.4%  Glycemic control for   <7.0% adults with diabetes          Passed - Valid encounter within last 6 months    Recent Outpatient Visits           3 months ago NSTEMI (non-ST elevated myocardial infarction) (HCC)   [place] [facility] - A Dept Of [place]. [facility] Joaquin Mulberry, MD   6 months ago Type 2 diabetes mellitus with other circulatory complication, without long-term current use of insulin  [facility])   [place] [facility] - A Dept Of [place]. [facility] Joaquin Mulberry, MD   9 months ago NSTEMI (non-ST elevated myocardial infarction) [facility])   [place] [facility] - A Dept Of [place]. [facility] Joaquin Mulberry, MD   1 year ago Annual visit for general adult medical examination with abnormal findings   [place] [facility] - A Dept Of [place]. [facility] Joaquin Mulberry, MD   1 year ago Type 2 diabetes mellitus with other circulatory complication, without long-term current use of insulin  [facility])   [place] [facility] - A Dept Of [place]. [facility] Joaquin Mulberry, MD

## 2024-03-31 NOTE — Telephone Encounter (Signed)
 Requested medications are due for refill today.  yes  Requested medications are on the active medications list.  yes  Last refill. 3/12 & 13/2025  Future visit scheduled.   yes  Notes to clinic.  Zetia  has expired labs. KCl was signed by another provider.    Requested Prescriptions  Pending Prescriptions Disp Refills   ezetimibe  (ZETIA ) 10 MG tablet 30 tablet 1    Sig: Take 1 tablet (10 mg total) by mouth daily.     Cardiovascular:  Antilipid - Sterol Transport Inhibitors Failed - 03/31/2024  4:43 PM      Failed - AST in normal range and within 360 days    AST  Date Value Ref Range Status  11/12/2022 25 15 - 41 U/L Final         Failed - ALT in normal range and within 360 days    ALT  Date Value Ref Range Status  11/12/2022 22 0 - 44 U/L Final         Failed - Lipid Panel in normal range within the last 12 months    Cholesterol, Total  Date Value Ref Range Status  10/08/2022 149 100 - 199 mg/dL Final   Cholesterol  Date Value Ref Range Status  01/10/2024 249 (H) 0 - 200 mg/dL Final   LDL Chol Calc (NIH)  Date Value Ref Range Status  10/08/2022 86 0 - 99 mg/dL Final   LDL Cholesterol  Date Value Ref Range Status  01/10/2024 135 (H) 0 - 99 mg/dL Final    Comment:           Total Cholesterol/HDL:CHD Risk Coronary Heart Disease Risk Table                     Men   Women  1/2 Average Risk   3.4   3.3  Average Risk       5.0   4.4  2 X Average Risk   9.6   7.1  3 X Average Risk  23.4   11.0        Use the calculated Patient Ratio above and the CHD Risk Table to determine the patient's CHD Risk.        ATP III CLASSIFICATION (LDL):  <100     mg/dL   Optimal  161-096  mg/dL   Near or Above                    Optimal  130-159  mg/dL   Borderline  045-409  mg/dL   High  >811     mg/dL   Very High Performed at [facility], [address] [place], [place] [address]    HDL  Date Value Ref Range Status  01/10/2024 42 >40 mg/dL Final  29/56/2130 47  >86 mg/dL Final   Triglycerides  Date Value Ref Range Status  01/10/2024 361 (H) <150 mg/dL Final         Passed - Patient is not pregnant      Passed - Valid encounter within last 12 months    Recent Outpatient Visits           3 months ago NSTEMI (non-ST elevated myocardial infarction) (HCC)   [place] [facility] - [facility] Dept Of [place]. [facility] Joaquin Mulberry, MD   6 months ago Type 2 diabetes mellitus with other circulatory complication, without long-term current use of insulin  [facility])   [place] [facility] - A Dept Of [place]. [facility] Joaquin Mulberry, MD   9 months ago NSTEMI (non-ST elevated myocardial infarction) [facility])   [place] [facility] - A Dept Of [place]. [facility] Joaquin Mulberry, MD   1 year ago Annual visit for general adult medical examination with abnormal findings   [facility] - A Dept Of [place]. [facility] Joaquin Mulberry, MD   1 year ago Type 2 diabetes mellitus with other circulatory complication, without long-term current use of insulin  [facility])   [place] [facility] - A Dept Of [place]. [facility] Adan Holms, Lavelle Posey, MD               potassium chloride  (KLOR-CON  M) 10 MEQ tablet 60 tablet 1    Sig: Take 2 tablets (20 mEq total) by mouth daily.     Endocrinology:  Minerals - Potassium Supplementation Passed - 03/31/2024  4:43 PM      Passed - K in normal range and within 360 days    Potassium  Date Value Ref Range Status  02/08/2024 3.5 3.5 - 5.2 mmol/L Final         Passed - Cr in normal range and within 360 days    Creatinine, Ser  Date Value Ref Range Status  02/08/2024 0.94 0.57 - 1.00 mg/dL Final         Passed - Valid encounter within last 12 months    Recent Outpatient Visits           3 months ago NSTEMI (non-ST elevated myocardial infarction) [facility])   [place] [facility] - A Dept Of [place]. [facility] Joaquin Mulberry, MD   6 months ago Type 2 diabetes mellitus with other circulatory complication, without long-term current use of insulin  [facility])   [place] [facility] - A Dept Of [place]. [facility] Joaquin Mulberry, MD   9 months ago NSTEMI (non-ST elevated myocardial infarction) [facility])   [place] [facility] - A Dept Of [place]. [facility] Joaquin Mulberry, MD   1 year ago Annual visit for general adult medical examination with abnormal findings   [place] [facility] - A Dept Of [place]. [facility] Joaquin Mulberry, MD   1 year ago Type 2 diabetes mellitus with other circulatory complication, without long-term current use of insulin  [facility])   [place] [facility] - A Dept Of [place]. [facility] Joaquin Mulberry, MD              Signed Prescriptions Disp Refills   DULoxetine  (CYMBALTA ) 60 MG capsule 90 capsule 0    Sig: Take 1 capsule (60 mg total) by mouth daily.     Psychiatry: Antidepressants - SNRI - duloxetine  Failed - 03/31/2024  1:61 PM      Failed - Completed PHQ-2 or PHQ-9 in the last 360 days      Failed - Last BP in normal range    BP Readings from Last 1 Encounters:  03/03/24 (!) 154/96         Passed - Cr in normal range and within 360 days    Creatinine, Ser  Date Value Ref Range Status  02/08/2024 0.94 0.57 - 1.00 mg/dL Final         Passed - eGFR is 30 or above and within 360 days    GFR  calc Af Amer  Date Value Ref Range Status  10/30/2020 93 >59 mL/min/1.73 Final    Comment:    **In accordance with recommendations from the NKF-ASN Task force,**   [facility] is in the process of updating its eGFR calculation to the   2021 CKD-EPI creatinine equation that estimates kidney function   without a race variable.    GFR, Estimated  Date Value Ref Range Status  01/13/2024 59 (L) >60 mL/min Final    Comment:    (NOTE) Calculated using the CKD-EPI Creatinine  Equation (2021)    eGFR  Date Value Ref Range Status  02/08/2024 70 >59 mL/min/1.73 Final         Passed - Valid encounter within last 6 months    Recent Outpatient Visits           3 months ago NSTEMI (non-ST elevated myocardial infarction) [facility])   [place] [facility] - A Dept Of [place]. [facility] Joaquin Mulberry, MD   6 months ago Type 2 diabetes mellitus with other circulatory complication, without long-term current use of insulin  [facility])   [place] [facility] - A Dept Of [place]. [facility] Joaquin Mulberry, MD   9 months ago NSTEMI (non-ST elevated myocardial infarction) [facility])   [place] [facility] - A Dept Of [place]. [facility] Joaquin Mulberry, MD   1 year ago Annual visit for general adult medical examination with abnormal findings   [place] [facility] - A Dept Of [place]. [facility] Joaquin Mulberry, MD   1 year ago Type 2 diabetes mellitus with other circulatory complication, without long-term current use of insulin  [facility])   [place] [facility] - A Dept Of [place]. [facility] Adan Holms, Lavelle Posey, MD               furosemide  (LASIX ) 40 MG tablet 90 tablet 0    Sig: Take 1 tablet (40 mg total) by mouth daily.     Cardiovascular:  Diuretics - Loop Failed - 03/31/2024  4:43 PM      Failed - Cl in normal range and within 180 days    Chloride  Date Value Ref Range Status  02/08/2024 94 (L) 96 - 106 mmol/L Final         Failed - Last BP in normal range    BP Readings from Last 1 Encounters:  03/03/24 (!) 154/96         Passed - K in normal range and within 180 days    Potassium  Date Value Ref Range Status  02/08/2024 3.5 3.5 - 5.2 mmol/L Final         Passed - Ca in normal range and within 180 days    Calcium   Date Value Ref Range Status  02/08/2024 9.3 8.7 - 10.2 mg/dL Final   Calcium , Ion  Date Value Ref Range Status  11/13/2022 1.14 (L) 1.15  - 1.40 mmol/L Final         Passed - Na in normal range and within 180 days    Sodium  Date Value Ref Range Status  02/08/2024 137 134 - 144 mmol/L Final         Passed - Cr in normal range and within 180 days    Creatinine, Ser  Date Value Ref Range Status  02/08/2024 0.94 0.57 - 1.00 mg/dL Final         Passed -  Mg Level in normal range and within 180 days    Magnesium   Date Value Ref Range Status  01/13/2024 1.8 1.7 - 2.4 mg/dL Final    Comment:    Performed at [facility], [address] [place], [place] [address]         Passed - Valid encounter within last 6 months    Recent Outpatient Visits           3 months ago NSTEMI (non-ST elevated myocardial infarction) [facility])   [place] [facility] - A Dept Of [place]. [facility] Joaquin Mulberry, MD   6 months ago Type 2 diabetes mellitus with other circulatory complication, without long-term current use of insulin  [facility])   [place] [facility] - A Dept Of [place]. [facility] Joaquin Mulberry, MD   9 months ago NSTEMI (non-ST elevated myocardial infarction) [facility])   [place] [facility] - A Dept Of [place]. [facility] Joaquin Mulberry, MD   1 year ago Annual visit for general adult medical examination with abnormal findings   [place] [facility] - A Dept Of [place]. [facility] Joaquin Mulberry, MD   1 year ago Type 2 diabetes mellitus with other circulatory complication, without long-term current use of insulin  [facility])   [place] [facility] - A Dept Of [place]. [facility] Joaquin Mulberry, MD              Refused Prescriptions Disp Refills   rosuvastatin  (CRESTOR ) 40 MG tablet 30 tablet 3    Sig: Take 1 tablet (40 mg total) by mouth daily.     Cardiovascular:  Antilipid - Statins 2 Failed - 03/31/2024  4:43 PM      Failed - Lipid Panel in normal range within the last 12 months    Cholesterol, Total  Date  Value Ref Range Status  10/08/2022 149 100 - 199 mg/dL Final   Cholesterol  Date Value Ref Range Status  01/10/2024 249 (H) 0 - 200 mg/dL Final   LDL Chol Calc (NIH)  Date Value Ref Range Status  10/08/2022 86 0 - 99 mg/dL Final   LDL Cholesterol  Date Value Ref Range Status  01/10/2024 135 (H) 0 - 99 mg/dL Final    Comment:           Total Cholesterol/HDL:CHD Risk Coronary Heart Disease Risk Table                     Men   Women  1/2 Average Risk   3.4   3.3  Average Risk       5.0   4.4  2 X Average Risk   9.6   7.1  3 X Average Risk  23.4   11.0        Use the calculated Patient Ratio above and the CHD Risk Table to determine the patient's CHD Risk.        ATP III CLASSIFICATION (LDL):  <100     mg/dL   Optimal  604-540  mg/dL   Near or Above                    Optimal  130-159  mg/dL   Borderline  981-191  mg/dL   High  >478     mg/dL   Very High Performed at [facility], [address], [place], [place]  [address]    HDL  Date Value Ref Range Status  01/10/2024 42 >40 mg/dL Final  60/45/4098 47 >11 mg/dL Final   Triglycerides  Date Value Ref Range Status  01/10/2024 361 (H) <150 mg/dL Final         Passed - Cr in normal range and within 360 days    Creatinine, Ser  Date Value Ref Range Status  02/08/2024 0.94 0.57 - 1.00 mg/dL Final         Passed - Patient is not pregnant      Passed - Valid encounter within last 12 months    Recent Outpatient Visits           3 months ago NSTEMI (non-ST elevated myocardial infarction) (HCC)   [place] [facility] - A Dept Of [place]. [facility] Joaquin Mulberry, MD   6 months ago Type 2 diabetes mellitus with other circulatory complication, without long-term current use of insulin  [facility])   [place] [facility] - A Dept Of [place]. [facility] Joaquin Mulberry, MD   9 months ago NSTEMI (non-ST elevated myocardial infarction) [facility])   [place] [facility]  - A Dept Of [place]. [facility] Joaquin Mulberry, MD   1 year ago Annual visit for general adult medical examination with abnormal findings   [place] [facility] - A Dept Of [place]. [facility] Joaquin Mulberry, MD   1 year ago Type 2 diabetes mellitus with other circulatory complication, without long-term current use of insulin  [facility])   [place] [facility] - A Dept Of [place]. [facility] Adan Holms, Lavelle Posey, MD               ticagrelor  (BRILINTA ) 90 MG TABS tablet 60 tablet 3    Sig: Take 1 tablet (90 mg total) by mouth 2 (two) times daily.     Hematology: Antiplatelets - ticagrelor  Passed - 03/31/2024  4:43 PM      Passed - Cr in normal range and within 180 days    Creatinine, Ser  Date Value Ref Range Status  02/08/2024 0.94 0.57 - 1.00 mg/dL Final         Passed - HCT in normal range and within 180 days    Hematocrit  Date Value Ref Range Status  02/08/2024 44.2 34.0 - 46.6 % Final         Passed - HGB in normal range and within 180 days    Hemoglobin  Date Value Ref Range Status  02/08/2024 15.0 11.1 - 15.9 g/dL Final         Passed - PLT in normal range and within 180 days    Platelets  Date Value Ref Range Status  02/08/2024 289 150 - 450 x10E3/uL Final         Passed - Last Heart Rate in normal range    Pulse Readings from Last 1 Encounters:  03/03/24 77         Passed - Valid encounter within last 6 months    Recent Outpatient Visits           3 months ago NSTEMI (non-ST elevated myocardial infarction) (HCC)   [place] [facility] - A Dept Of [place]. [facility] Joaquin Mulberry, MD   6 months ago Type 2 diabetes mellitus with other circulatory complication, without long-term current use of insulin  [facility])   [place] [facility] [place] -  A Dept Of [place]. [facility] Joaquin Mulberry, MD   9 months ago NSTEMI (non-ST elevated myocardial infarction) [facility])   [place]  Comm [facility] Grout - A Dept Of [place]. [facility] Joaquin Mulberry, MD   1 year ago Annual visit for general adult medical examination with abnormal findings   [place] [facility] - A Dept Of [place]. [facility] Joaquin Mulberry, MD   1 year ago Type 2 diabetes mellitus with other circulatory complication, without long-term current use of insulin  [facility])   [place] [facility] - A Dept Of [place]. [facility] [place], Lavelle Posey, MD               insulin  glargine (LANTUS  SOLOSTAR) 100 UNIT/ML Solostar Pen 30 mL 3    Sig: Inject 35 Units into the skin 2 (two) times daily.     Endocrinology:  Diabetes - Insulins Failed - 03/31/2024  4:43 PM      Failed - HBA1C is between 0 and 7.9 and within 180 days    HbA1c, POC (controlled diabetic range)  Date Value Ref Range Status  09/16/2023 12.4 (A) 0.0 - 7.0 % Final   Hgb A1c MFr Bld  Date Value Ref Range Status  01/10/2024 12.3 (H) 4.8 - 5.6 % Final    Comment:    (NOTE) Pre diabetes:          5.7%-6.4%  Diabetes:              >6.4%  Glycemic control for   <7.0% adults with diabetes          Passed - Valid encounter within last 6 months    Recent Outpatient Visits           3 months ago NSTEMI (non-ST elevated myocardial infarction) (HCC)   [place] [facility] - A Dept Of [place]. [facility] Joaquin Mulberry, MD   6 months ago Type 2 diabetes mellitus with other circulatory complication, without long-term current use of insulin  [facility])   [place] [facility] - A Dept Of [place]. [facility] Joaquin Mulberry, MD   9 months ago NSTEMI (non-ST elevated myocardial infarction) [facility])   [place] [facility] - A Dept Of [place]. [facility] Joaquin Mulberry, MD   1 year ago Annual visit for general adult medical examination with abnormal findings   [place] [facility] - A Dept Of [place]. [facility]  Joaquin Mulberry, MD   1 year ago Type 2 diabetes mellitus with other circulatory complication, without long-term current use of insulin  [facility])   [place] [facility] - A Dept Of [place]. [facility] Joaquin Mulberry, MD               hw

## 2024-03-31 NOTE — Telephone Encounter (Signed)
 Requested Prescriptions  Pending Prescriptions Disp Refills   ezetimibe  (ZETIA ) 10 MG tablet 30 tablet 1    Sig: Take 1 tablet (10 mg total) by mouth daily.     Cardiovascular:  Antilipid - Sterol Transport Inhibitors Failed - 03/31/2024  4:40 PM      Failed - AST in normal range and within 360 days    AST  Date Value Ref Range Status  11/12/2022 25 15 - 41 U/L Final         Failed - ALT in normal range and within 360 days    ALT  Date Value Ref Range Status  11/12/2022 22 0 - 44 U/L Final         Failed - Lipid Panel in normal range within the last 12 months    Cholesterol, Total  Date Value Ref Range Status  10/08/2022 149 100 - 199 mg/dL Final   Cholesterol  Date Value Ref Range Status  01/10/2024 249 (H) 0 - 200 mg/dL Final   LDL Chol Calc (NIH)  Date Value Ref Range Status  10/08/2022 86 0 - 99 mg/dL Final   LDL Cholesterol  Date Value Ref Range Status  01/10/2024 135 (H) 0 - 99 mg/dL Final    Comment:           Total Cholesterol/HDL:CHD Risk Coronary Heart Disease Risk Table                     Men   Women  1/2 Average Risk   3.4   3.3  Average Risk       5.0   4.4  2 X Average Risk   9.6   7.1  3 X Average Risk  23.4   11.0        Use the calculated Patient Ratio above and the CHD Risk Table to determine the patient's CHD Risk.        ATP III CLASSIFICATION (LDL):  <100     mg/dL   Optimal  161-096  mg/dL   Near or Above                    Optimal  130-159  mg/dL   Borderline  045-409  mg/dL   High  >811     mg/dL   Very High Performed at [facility], [address], [place], [place] [address]    HDL  Date Value Ref Range Status  01/10/2024 42 >40 mg/dL Final  29/56/2130 47 >86 mg/dL Final   Triglycerides  Date Value Ref Range Status  01/10/2024 361 (H) <150 mg/dL Final         Passed - Patient is not pregnant      Passed - Valid encounter within last 12 months    Recent Outpatient Visits           3 months ago NSTEMI (non-ST  elevated myocardial infarction) (HCC)   [place] [facility] Dept Of [place]. [facility] Joaquin Mulberry, MD   6 months ago Type 2 diabetes mellitus with other circulatory complication, without long-term current use of insulin  [facility])   [place] [facility] - A Dept Of [place]. [facility] Joaquin Mulberry, MD   9 months ago NSTEMI (non-ST elevated myocardial infarction) [facility])   [place] [facility] - A Dept Of [place]. [facility] Joaquin Mulberry, MD   1 year ago Annual visit  for general adult medical examination with abnormal findings   [place] [facility] - A Dept Of [place]. [facility] [place] [facility] Joaquin Mulberry, MD   1 year ago Type 2 diabetes mellitus with other circulatory complication, without long-term current use of insulin  [facility])   [place] [facility] - A Dept Of [place]. [facility] Adan Holms, Lavelle Posey, MD               potassium chloride  (KLOR-CON  M) 10 MEQ tablet 60 tablet 1    Sig: Take 2 tablets (20 mEq total) by mouth daily.     Endocrinology:  Minerals - Potassium Supplementation Passed - 03/31/2024  4:40 PM      Passed - K in normal range and within 360 days    Potassium  Date Value Ref Range Status  02/08/2024 3.5 3.5 - 5.2 mmol/L Final         Passed - Cr in normal range and within 360 days    Creatinine, Ser  Date Value Ref Range Status  02/08/2024 0.94 0.57 - 1.00 mg/dL Final         Passed - Valid encounter within last 12 months    Recent Outpatient Visits           3 months ago NSTEMI (non-ST elevated myocardial infarction) [facility])   [place] [facility] - A Dept Of [place]. [facility] Joaquin Mulberry, MD   6 months ago Type 2 diabetes mellitus with other circulatory complication, without long-term current use of insulin  [facility])   [place] [facility] - A Dept Of [place]. [facility] Joaquin Mulberry, MD   9 months  ago NSTEMI (non-ST elevated myocardial infarction) [facility])   [place] [facility] - A Dept Of [place]. [facility] Joaquin Mulberry, MD   1 year ago Annual visit for general adult medical examination with abnormal findings   [place] [facility] - A Dept Of [place]. [facility] Joaquin Mulberry, MD   1 year ago Type 2 diabetes mellitus with other circulatory complication, without long-term current use of insulin  [facility])   [place] [facility] - A Dept Of . [facility] Adan Holms, Lavelle Posey, MD               rosuvastatin  (CRESTOR ) 40 MG tablet 30 tablet 3    Sig: Take 1 tablet (40 mg total) by mouth daily.     Cardiovascular:  Antilipid - Statins 2 Failed - 03/31/2024  4:40 PM      Failed - Lipid Panel in normal range within the last 12 months    Cholesterol, Total  Date Value Ref Range Status  10/08/2022 149 100 - 199 mg/dL Final   Cholesterol  Date Value Ref Range Status  01/10/2024 249 (H) 0 - 200 mg/dL Final   LDL Chol Calc (NIH)  Date Value Ref Range Status  10/08/2022 86 0 - 99 mg/dL Final   LDL Cholesterol  Date Value Ref Range Status  01/10/2024 135 (H) 0 - 99 mg/dL Final    Comment:           Total Cholesterol/HDL:CHD Risk Coronary Heart Disease Risk Table                     Men   Women  1/2 Average Risk   3.4   3.3  Average Risk  5.0   4.4  2 X Average Risk   9.6   7.1  3 X Average Risk  23.4   11.0        Use the calculated Patient Ratio above and the CHD Risk Table to determine the patient's CHD Risk.        ATP III CLASSIFICATION (LDL):  <100     mg/dL   Optimal  409-811  mg/dL   Near or Above                    Optimal  130-159  mg/dL   Borderline  914-782  mg/dL   High  >956     mg/dL   Very High Performed at [facility], [address], [place], [place] [address]    HDL  Date Value Ref Range Status  01/10/2024 42 >40 mg/dL Final  65/78/4696 47 >29 mg/dL Final    Triglycerides  Date Value Ref Range Status  01/10/2024 361 (H) <150 mg/dL Final         Passed - Cr in normal range and within 360 days    Creatinine, Ser  Date Value Ref Range Status  02/08/2024 0.94 0.57 - 1.00 mg/dL Final         Passed - Patient is not pregnant      Passed - Valid encounter within last 12 months    Recent Outpatient Visits           3 months ago NSTEMI (non-ST elevated myocardial infarction) (HCC)   [place] [facility] - A Dept Of [place]. [facility] Joaquin Mulberry, MD   6 months ago Type 2 diabetes mellitus with other circulatory complication, without long-term current use of insulin  [facility])   [place] [facility] - A Dept Of [place]. [facility] Joaquin Mulberry, MD   9 months ago NSTEMI (non-ST elevated myocardial infarction) [facility])   [place] [facility] - A Dept Of [place]. [facility] Joaquin Mulberry, MD   1 year ago Annual visit for general adult medical examination with abnormal findings   [place] [facility] - A Dept Of [place]. [facility] Joaquin Mulberry, MD   1 year ago Type 2 diabetes mellitus with other circulatory complication, without long-term current use of insulin  [facility])   [place] [facility] - A Dept Of . [facility] Adan Holms, Lavelle Posey, MD               ticagrelor  (BRILINTA ) 90 MG TABS tablet 60 tablet 3    Sig: Take 1 tablet (90 mg total) by mouth 2 (two) times daily.     Hematology: Antiplatelets - ticagrelor  Passed - 03/31/2024  4:40 PM      Passed - Cr in normal range and within 180 days    Creatinine, Ser  Date Value Ref Range Status  02/08/2024 0.94 0.57 - 1.00 mg/dL Final         Passed - HCT in normal range and within 180 days    Hematocrit  Date Value Ref Range Status  02/08/2024 44.2 34.0 - 46.6 % Final         Passed - HGB in normal range and within 180 days    Hemoglobin  Date Value Ref Range  Status  02/08/2024 15.0 11.1 - 15.9 g/dL Final         Passed - PLT in normal range  and within 180 days    Platelets  Date Value Ref Range Status  02/08/2024 289 150 - 450 x10E3/uL Final         Passed - Last Heart Rate in normal range    Pulse Readings from Last 1 Encounters:  03/03/24 77         Passed - Valid encounter within last 6 months    Recent Outpatient Visits           3 months ago NSTEMI (non-ST elevated myocardial infarction) [facility])   [place] [facility] - A Dept Of [place]. [facility] Joaquin Mulberry, MD   6 months ago Type 2 diabetes mellitus with other circulatory complication, without long-term current use of insulin  [facility])   [place] [facility] - A Dept Of [place]. [facility] Joaquin Mulberry, MD   9 months ago NSTEMI (non-ST elevated myocardial infarction) [facility])   [place] [facility] - A Dept Of [place]. [facility] Joaquin Mulberry, MD   1 year ago Annual visit for general adult medical examination with abnormal findings   [place] [facility] - A Dept Of [place]. [facility] Joaquin Mulberry, MD   1 year ago Type 2 diabetes mellitus with other circulatory complication, without long-term current use of insulin  [facility])   [place] [facility] - A Dept Of [place]. [facility] Adan Holms, Lavelle Posey, MD               DULoxetine  (CYMBALTA ) 60 MG capsule 90 capsule 1    Sig: Take 1 capsule (60 mg total) by mouth daily.     Psychiatry: Antidepressants - SNRI - duloxetine  Failed - 03/31/2024  4:40 PM      Failed - Completed PHQ-2 or PHQ-9 in the last 360 days      Failed - Last BP in normal range    BP Readings from Last 1 Encounters:  03/03/24 (!) 154/96         Passed - Cr in normal range and within 360 days    Creatinine, Ser  Date Value Ref Range Status  02/08/2024 0.94 0.57 - 1.00 mg/dL Final         Passed - eGFR is 30 or above and within 360 days     GFR calc Af Amer  Date Value Ref Range Status  10/30/2020 93 >59 mL/min/1.73 Final    Comment:    **In accordance with recommendations from the NKF-ASN Task force,**   [facility] is in the process of updating its eGFR calculation to the   2021 CKD-EPI creatinine equation that estimates kidney function   without a race variable.    GFR, Estimated  Date Value Ref Range Status  01/13/2024 59 (L) >60 mL/min Final    Comment:    (NOTE) Calculated using the CKD-EPI Creatinine Equation (2021)    eGFR  Date Value Ref Range Status  02/08/2024 70 >59 mL/min/1.73 Final         Passed - Valid encounter within last 6 months    Recent Outpatient Visits           3 months ago NSTEMI (non-ST elevated myocardial infarction) [facility])   [place] [facility] - A Dept Of [place]. [facility] Joaquin Mulberry, MD   6 months ago Type 2 diabetes mellitus with other circulatory complication, without long-term current use of insulin  [facility])   [place] [facility] - A Dept Of [place]. [facility] Joaquin Mulberry, MD   9 months ago NSTEMI (non-ST elevated myocardial infarction) [facility])   [place] [facility] - A Dept Of [place]. [facility] Joaquin Mulberry, MD   1 year ago Annual visit for general adult medical examination with abnormal findings   [place] [facility] - A Dept Of [place]. [facility] Joaquin Mulberry, MD   1 year ago Type 2 diabetes mellitus with other circulatory complication, without long-term current use of insulin  [facility])   [place] [facility] - A Dept Of [place]. [facility] [place], Lavelle Posey, MD               furosemide  (LASIX ) 40 MG tablet 30 tablet 1    Sig: Take 1 tablet (40 mg total) by mouth daily.     Cardiovascular:  Diuretics - Loop Failed - 03/31/2024  4:40 PM      Failed - Cl in normal range and within 180 days    Chloride  Date Value Ref Range Status  02/08/2024  94 (L) 96 - 106 mmol/L Final         Failed - Last BP in normal range    BP Readings from Last 1 Encounters:  03/03/24 (!) 154/96         Passed - K in normal range and within 180 days    Potassium  Date Value Ref Range Status  02/08/2024 3.5 3.5 - 5.2 mmol/L Final         Passed - Ca in normal range and within 180 days    Calcium   Date Value Ref Range Status  02/08/2024 9.3 8.7 - 10.2 mg/dL Final   Calcium , Ion  Date Value Ref Range Status  11/13/2022 1.14 (L) 1.15 - 1.40 mmol/L Final         Passed - Na in normal range and within 180 days    Sodium  Date Value Ref Range Status  02/08/2024 137 134 - 144 mmol/L Final         Passed - Cr in normal range and within 180 days    Creatinine, Ser  Date Value Ref Range Status  02/08/2024 0.94 0.57 - 1.00 mg/dL Final         Passed - Mg Level in normal range and within 180 days    Magnesium   Date Value Ref Range Status  01/13/2024 1.8 1.7 - 2.4 mg/dL Final    Comment:    Performed at [facility], [address] [place], [place] [address]         Passed - Valid encounter within last 6 months    Recent Outpatient Visits           3 months ago NSTEMI (non-ST elevated myocardial infarction) [facility])   [place] [facility] - A Dept Of [place]. [facility] Joaquin Mulberry, MD   6 months ago Type 2 diabetes mellitus with other circulatory complication, without long-term current use of insulin  [facility])    Comm Health [facility]ss - A Dept Of [place]. [facility] Joaquin Mulberry, MD   9 months ago NSTEMI (non-ST elevated myocardial infarction) [facility])    Comm Health [facility] Grout - A Dept Of [place]. [facility] Joaquin Mulberry, MD   1 year ago Annual visit for general adult medical examination with abnormal findings    Comm  Health Wellnss - A Dept Of [place]. [facility] Joaquin Mulberry, MD   1 year ago Type 2 diabetes mellitus with other circulatory  complication, without long-term current use of insulin  [facility])   [place] [facility] - A Dept Of [place]. [facility] [place], Lavelle Posey, MD               insulin  glargine (LANTUS  SOLOSTAR) 100 UNIT/ML Solostar Pen 30 mL 3    Sig: Inject 35 Units into the skin 2 (two) times daily.     Endocrinology:  Diabetes - Insulins Failed - 03/31/2024  4:40 PM      Failed - HBA1C is between 0 and 7.9 and within 180 days    HbA1c, POC (controlled diabetic range)  Date Value Ref Range Status  09/16/2023 12.4 (A) 0.0 - 7.0 % Final   Hgb A1c MFr Bld  Date Value Ref Range Status  01/10/2024 12.3 (H) 4.8 - 5.6 % Final    Comment:    (NOTE) Pre diabetes:          5.7%-6.4%  Diabetes:              >6.4%  Glycemic control for   <7.0% adults with diabetes          Passed - Valid encounter within last 6 months    Recent Outpatient Visits           3 months ago NSTEMI (non-ST elevated myocardial infarction) (HCC)   [place] [facility] - A Dept Of [place]. [facility] Joaquin Mulberry, MD   6 months ago Type 2 diabetes mellitus with other circulatory complication, without long-term current use of insulin  [facility])   [place] [facility] - A Dept Of [place]. [facility] Joaquin Mulberry, MD   9 months ago NSTEMI (non-ST elevated myocardial infarction) [facility])   [place] [facility] - A Dept Of [place]. [facility] Joaquin Mulberry, MD   1 year ago Annual visit for general adult medical examination with abnormal findings   [place] [facility] - A Dept Of [place]. [facility] Joaquin Mulberry, MD   1 year ago Type 2 diabetes mellitus with other circulatory complication, without long-term current use of insulin  [facility])    Comm Health Wellnss - A Dept Of . [facility] Joaquin Mulberry, MD

## 2024-04-04 ENCOUNTER — Other Ambulatory Visit: Payer: Self-pay

## 2024-04-04 ENCOUNTER — Other Ambulatory Visit: Payer: Self-pay | Admitting: Family Medicine

## 2024-04-05 ENCOUNTER — Other Ambulatory Visit: Payer: Self-pay

## 2024-04-05 MED ORDER — POTASSIUM CHLORIDE CRYS ER 10 MEQ PO TBCR
20.0000 meq | EXTENDED_RELEASE_TABLET | Freq: Every day | ORAL | 1 refills | Status: AC
  Filled 2024-04-05 – 2024-11-23 (×4): qty 60, 30d supply, fill #0
  Filled ????-??-??: fill #0

## 2024-04-05 MED ORDER — CARVEDILOL 3.125 MG PO TABS
3.1250 mg | ORAL_TABLET | Freq: Two times a day (BID) | ORAL | 1 refills | Status: DC
  Filled 2024-04-05: qty 60, 30d supply, fill #0
  Filled 2024-06-07: qty 60, 30d supply, fill #1

## 2024-04-05 MED ORDER — EZETIMIBE 10 MG PO TABS
10.0000 mg | ORAL_TABLET | Freq: Every day | ORAL | 1 refills | Status: DC
Start: 2024-04-05 — End: 2024-07-25
  Filled 2024-04-05: qty 30, 30d supply, fill #0
  Filled 2024-06-07: qty 30, 30d supply, fill #1

## 2024-04-06 NOTE — Progress Notes (Deleted)
 Established Patient Office Visit  Subjective   Patient ID: [name], female    DOB: Dec 09, 1965  Age: 58 y.o. MRN: [id number]  No chief complaint on file.   HFU  pcp newlin  Adm 01/2024: Admit date: 01/09/2024 Discharge date: 01/13/2024   PCP:  Joaquin Mulberry, MD              [facility] Providers Cardiologist:  Wendie Hamburg, MD  Cardiology APP:  [place], [place] (Inactive)         Discharge Diagnoses   Principal Problem:   Acute coronary syndrome [facility]) Active Problems:   Essential hypertension, benign   Uncontrolled type 2 diabetes mellitus with hyperglycemia, with long-term current use of insulin  (HCC)   H/o NSTEMI (non-ST elevated myocardial infarction) (HCC)   CAD S/P percutaneous coronary angioplasty   Hyperlipidemia associated with type 2 diabetes mellitus (HCC)   Stroke (cerebrum) (HCC)   Hemiparesis affecting left side as late effect of cerebrovascular accident [facility])   Coronary artery disease involving native coronary artery of native heart with unstable angina pectoris (HCC)   Hypertensive heart disease with heart failure [facility])     Diagnostic Studies/Procedures    Cardiac Cath 01/11/2024: Significant MV CAD-ostial, eccentric 70% LM, 80% ostial small D2, long segment of mid LAD up to 50%.  60% proximal LCx and CTO of overlapping stents in the PDA.  Widely patent LCx stent.  EF.  LVEDP 35 mmHg.--Noted difficulty with radial access! => Recommendation was to consider CABG versus left main PCI.  Increased diuretic with 40 mg IV twice daily   Echo 01/12/2024: Hyperdynamic LV with EF of 70 to 75%.  No regional wall motion normality.  Mild concentric LVH.  Normal PAP.  Possible liver cyst.   Radiology::  CT Head with/without Contrast 01/11/2024: No acute findings, mild to minimal small vessel change.  CTA showed no large vessel occlusion or proximal stenosis.  Bilateral carotid bifurcation atherosclerosis enlarged thyroid   MRI brain  01/12/2024:3 punctate acute/subacute infarcts seen in the right cerebellum and bilateral cerebral cortex.  Chronic small vessel disease.  Motion artifact noted. _____________   History of Present Illness    [name] is a 58 y.o. 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 Patient presented to the ED with progressive exertional dyspnea/exertional limitation/anginal chest pain with symptoms concerning for acute coronary syndrome and trivial troponin elevation.  Initially presented hypertensive and blood pressures in the 180s to 190s concerning for potential accelerated hypertension/hypertensive urgency. => She had 2 weeks of progressive dyspnea and chest pain on exertion able to only walk very 50 feet with her walker before developing symptoms.  On arrival had 7-8/10 chest pain and had a steady aching discomfort despite being on nitroglycerin  infusion. On the morning prior to admission she had severe left-sided chest pain radiating to the left breast with dyspnea and diaphoresis prompting trip to the ER.  Somewhat improvement to nitroglycerin .   Hospital Course    Consultants: neurology, [facility] 3   Patient underwent cardiac catheterization on the following morning after admission, unfortunately was found to have left main disease but near the end the procedure she developed a headache and subsequently in the holding area had notable altered mental status with agitation and confusion that led to code stroke being called leading to head CT with CTA head there were both not  revealing.  She required sedation for the study but results as well as for MRI which showed small, focal 3 punctate lesions however with significant altered ental status and somnolence and agitation out of proportion to extent of findings on the MRI.  The following day she was  awake and alert and arousable with no significant residual symptoms.     ACS/CAD with unstable angina Hyperlipidemia Patient previously presented for an [facility] in 10/[facility] to [facility]; cath demonstrated severe stenosis of Lcx and PDA with moderate stenosis of D2 s/p DES to PDA and mid-LCX. She subsequently continued to have intermittent chest pain that worsened on exertion and improved with nitroglycerin . She represented with chest pain on 05/05/2020; left prior to complete evaluation. Lexiscan  on 06/15/20 was normal. TTE on 07/02/20 demonstrated normal Bi-V function. She had a repeat NSTEMI in 03/2022 with cath demonstrating 99% ISR of PDA stent with residual D2 (80%) lesion due to proximity to her LAD. Her course c/b an acute CVA after cath and she was given TPA. On admission, patient reported 2 weeks of progressive dyspnea and chest pain on exertion; now only able to walk 50 ft with her walker prior to developing symptoms. She also reports episodes waking up in the middle of night with chest pain; she does have some improvement of her chest pain when leaning forward compared to laying flat. She described her chest pain as pressure up to 10/10, no improvement with nitroglycerin . Troponin 18->20. She was started on heparin , imdur . Plans made for LHC given symptoms consistent with unstable angina. Patient underwent LHC as above with Dr. Nolan Battle on 3/10. This found 70% ostial left main disease, 80% Diagonal 2, widely patent left Cirucmflex stent, and chronically occluded overlapping rPDA stents. Patient was seen by Dr. Honey Lusty and given co-morbidities, deemed not a CABG candidate. Initial plan was for inpatient PCI of left main. However, given improvement in exertional symptoms as well peri-procedural CVA, decision made to discharge home with staged PCI in ~3 weeks.  Close outpatient follow up arranged  Plavix  initially restarted but given LM disease and no surgical plans, converted to Brilinta .  HLD open goal LDL<55):  Continue Crestor  40mg  (switched from Atorvastatin  due to chronic/prior to admission difficulty with pill size) and Zetia  10mg .   Continue Imdur  60mg   Continue Coreg  3.125mg  BID => plan to titrate back to home dose in the outpatient regimen as BP tolerates.  Would then add back ARB..     {History (Optional):23778}  ROS    Objective:     LMP 01/24/2015 (Approximate)  {Vitals History (Optional):23777}  Physical Exam   No results found for any visits on 04/07/24.  {Labs (Optional):23779}  The ASCVD Risk score (Arnett DK, et al., 2019) failed to calculate for the following reasons:   Risk score cannot be calculated because patient has a medical history suggesting prior/existing ASCVD    Assessment & Plan:   Problem List Items Addressed This Visit   None   No follow-ups on file.    Arlene Lacy, MD

## 2024-04-07 ENCOUNTER — Ambulatory Visit: Admitting: Critical Care Medicine

## 2024-04-15 ENCOUNTER — Other Ambulatory Visit: Payer: Self-pay

## 2024-06-01 ENCOUNTER — Telehealth: Payer: Self-pay | Admitting: Family Medicine

## 2024-06-01 NOTE — Telephone Encounter (Signed)
 Called patient, no answer. Left voicemail confirming upcoming appointment on 06/02/2024 at 10:50 am. Provided callback number for any questions or changes.

## 2024-06-02 ENCOUNTER — Ambulatory Visit: Admitting: Family Medicine

## 2024-06-06 ENCOUNTER — Ambulatory Visit: Payer: Self-pay

## 2024-06-06 NOTE — Telephone Encounter (Signed)
 Patient scheduled an appointment for 06/07/2024 at 0910. Somewhat hesitant, she stated she will try to come to appt. She was encouraged to call office if she was not able to make appt and could do a video visit, MyChart. Verbalized understanding.

## 2024-06-06 NOTE — Telephone Encounter (Signed)
 FYI Only or Action Required?: Action required by provider: update on patient condition.  Patient was last seen in primary care on 12/23/2023 by Newlin, Enobong, MD.  Called Nurse Triage reporting Fall and Groin Pain.  Symptoms began a week ago.  Interventions attempted: Nothing.  Symptoms are: gradually worsening.  Triage Disposition: Go to ED Now (or PCP Triage)recommend ED, patient states that she will go tomorrow  Patient/caregiver understands and will follow disposition?: Unsure   Copied from CRM #[id number]. Topic: Clinical - Red Word Triage >> Jun 06, 2024  1:58 PM Pinkey ORN wrote: Red Word that prompted transfer to Nurse Triage: Fall Reason for Disposition  [1] MODERATE to SEVERE pain (e.g., interferes with normal activities, awakens from sleep, excruciating) AND [2] no known injury or muscle strain  Answer Assessment - Initial Assessment Questions States that she has had falls recently at night when she wakes up to go to the bathroom at night  1. MECHANISM: How did the injury happen? (e.g., twisting injury, direct blow)      Fell while going down steps at daughter's house 2. ONSET: When did the injury happen? (e.g., minutes, hours ago)      Last week 3. LOCATION: Where is the injury located?      Right groin pain, pain from outside of hip to knee 4. APPEARANCE of INJURY: What does the injury look like?  (e.g., looks normal; bruise, swelling)     Bruising present, no swelling noted.   5. PAIN: Is there pain? If Yes, ask: How bad is the pain?   What does it keep you from doing? (Scale 0-10; or none, mild, moderate, severe)     8/10 at time of call 6. SIZE: For cuts, bruises, or swelling, ask: How large is it? (e.g., inches or centimeters;  entire joint)      No cuts, only bruising 7. TETANUS: For any breaks in the skin, ask: When was your last tetanus booster?     N/a  Patient states that she is allergic to tylenol  and ibuprofen  8. OTHER SYMPTOMS: Do  you have any other symptoms?      Limping, but was limping before fall.  History of stroke in 2023 9. PREGNANCY: Is there any chance you are pregnant? When was your last menstrual period?     N/A  Protocols used: Groin Injury and Strain-A-AH

## 2024-06-07 ENCOUNTER — Other Ambulatory Visit: Payer: Self-pay

## 2024-06-07 ENCOUNTER — Ambulatory Visit: Admitting: Family Medicine

## 2024-06-14 ENCOUNTER — Other Ambulatory Visit: Payer: Self-pay

## 2024-06-15 ENCOUNTER — Other Ambulatory Visit: Payer: Self-pay

## 2024-06-21 ENCOUNTER — Ambulatory Visit (HOSPITAL_COMMUNITY)
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
## 2024-07-12 ENCOUNTER — Ambulatory Visit: Attending: Cardiology | Admitting: Cardiology

## 2024-07-20 ENCOUNTER — Other Ambulatory Visit: Payer: Self-pay

## 2024-07-20 ENCOUNTER — Other Ambulatory Visit: Payer: Self-pay | Admitting: Family Medicine

## 2024-07-21 ENCOUNTER — Other Ambulatory Visit: Payer: Self-pay | Admitting: Family Medicine

## 2024-07-21 ENCOUNTER — Other Ambulatory Visit: Payer: Self-pay

## 2024-07-21 DIAGNOSIS — Z1231 Encounter for screening mammogram for malignant neoplasm of breast: Secondary | ICD-10-CM

## 2024-07-21 MED ORDER — ROSUVASTATIN CALCIUM 40 MG PO TABS
40.0000 mg | ORAL_TABLET | Freq: Every day | ORAL | 3 refills | Status: AC
  Filled 2024-07-21 – 2024-11-23 (×3): qty 30, 30d supply, fill #0
  Filled ????-??-??: fill #0

## 2024-07-21 MED ORDER — METHOCARBAMOL 500 MG PO TABS
1000.0000 mg | ORAL_TABLET | Freq: Three times a day (TID) | ORAL | 0 refills | Status: AC
  Filled 2024-07-21: qty 60, 10d supply, fill #0

## 2024-07-22 ENCOUNTER — Other Ambulatory Visit: Payer: Self-pay

## 2024-07-25 ENCOUNTER — Other Ambulatory Visit: Payer: Self-pay

## 2024-07-25 ENCOUNTER — Other Ambulatory Visit: Payer: Self-pay | Admitting: Family Medicine

## 2024-07-25 DIAGNOSIS — F419 Anxiety disorder, unspecified: Secondary | ICD-10-CM

## 2024-07-26 ENCOUNTER — Other Ambulatory Visit: Payer: Self-pay

## 2024-07-26 MED ORDER — DULOXETINE HCL 60 MG PO CPEP
60.0000 mg | ORAL_CAPSULE | Freq: Every day | ORAL | 0 refills | Status: AC
  Filled 2024-07-26: qty 90, 90d supply, fill #0

## 2024-07-26 MED ORDER — EZETIMIBE 10 MG PO TABS
10.0000 mg | ORAL_TABLET | Freq: Every day | ORAL | 1 refills | Status: AC
  Filled 2024-07-26 – 2024-08-26 (×3): qty 30, 30d supply, fill #0
  Filled 2024-10-13: qty 30, 30d supply, fill #1

## 2024-07-26 NOTE — Telephone Encounter (Signed)
 Requested Prescriptions  Pending Prescriptions Disp Refills   DULoxetine  (CYMBALTA ) 60 MG capsule 90 capsule 0    Sig: Take 1 capsule (60 mg total) by mouth daily.     Psychiatry: Antidepressants - SNRI - duloxetine  Failed - 07/26/2024  2:41 PM      Failed - Completed PHQ-2 or PHQ-9 in the last 360 days      Failed - Last BP in normal range    BP Readings from Last 1 Encounters:  03/03/24 (!) 154/96         Failed - Valid encounter within last 6 months    Recent Outpatient Visits           7 months ago NSTEMI (non-ST elevated myocardial infarction) [facility])   [place] [facility] [place]. [facility] Delbert Clam, MD   10 months ago Type 2 diabetes mellitus with other circulatory complication, without long-term current use of insulin  [facility])   [place] [facility] [place]. [facility] Delbert Clam, MD   1 year ago NSTEMI (non-ST elevated myocardial infarction) [facility])   [place] [facility] [place]. [facility] Delbert Clam, MD   1 year ago Annual visit for general adult medical examination with abnormal findings   [place] [facility] Of [place]. [facility] Delbert Clam, MD   1 year ago Type 2 diabetes mellitus with other circulatory complication, without long-term current use of insulin  [facility])   [place] [facility] [place]. [facility] Delbert Clam, MD              Passed - Cr in normal range and within 360 days    Creatinine, Ser  Date Value Ref Range Status  02/08/2024 0.94 0.57 - 1.00 mg/dL Final         Passed - eGFR is 30 or above and within 360 days    GFR calc Af Amer  Date Value Ref Range Status  10/30/2020 93 >59 mL/min/1.73 Final    Comment:    **In accordance with recommendations from the NKF-ASN Task force,**   [facility] is in the process of updating its eGFR calculation to the   2021  CKD-EPI creatinine equation that estimates kidney function   without a race variable.    GFR, Estimated  Date Value Ref Range Status  01/13/2024 59 (L) >60 mL/min Final    Comment:    (NOTE) Calculated using the CKD-EPI Creatinine Equation (2021)    eGFR  Date Value Ref Range Status  02/08/2024 70 >59 mL/min/1.73 Final          ezetimibe  (ZETIA ) 10 MG tablet 30 tablet 1    Sig: Take 1 tablet (10 mg total) by mouth daily.     Cardiovascular:  Antilipid - Sterol Transport Inhibitors Failed - 07/26/2024  2:41 PM      Failed - AST in normal range and within 360 days    AST  Date Value Ref Range Status  11/12/2022 25 15 - 41 U/L Final         Failed - ALT in normal range and within 360 days    ALT  Date Value Ref Range Status  11/12/2022 22 0 - 44 U/L Final         Failed - Lipid Panel in normal range within the last 12  months    Cholesterol, Total  Date Value Ref Range Status  10/08/2022 149 100 - 199 mg/dL Final   Cholesterol  Date Value Ref Range Status  01/10/2024 249 (H) 0 - 200 mg/dL Final   LDL Chol Calc (NIH)  Date Value Ref Range Status  10/08/2022 86 0 - 99 mg/dL Final   LDL Cholesterol  Date Value Ref Range Status  01/10/2024 135 (H) 0 - 99 mg/dL Final    Comment:           Total Cholesterol/HDL:CHD Risk Coronary Heart Disease Risk Table                     Men   Women  1/2 Average Risk   3.4   3.3  Average Risk       5.0   4.4  2 X Average Risk   9.6   7.1  3 X Average Risk  23.4   11.0        Use the calculated Patient Ratio above and the CHD Risk Table to determine the patient's CHD Risk.        ATP III CLASSIFICATION (LDL):  <100     mg/dL   Optimal  899-870  mg/dL   Near or Above                    Optimal  130-159  mg/dL   Borderline  839-810  mg/dL   High  >809     mg/dL   Very High Performed at [facility], [address] [place], [place] [address]    HDL  Date Value Ref Range Status  01/10/2024 42 >40 mg/dL Final   87/93/7976 47 >60 mg/dL Final   Triglycerides  Date Value Ref Range Status  01/10/2024 361 (H) <150 mg/dL Final         Passed - Patient is not pregnant      Passed - Valid encounter within last 12 months    Recent Outpatient Visits           7 months ago NSTEMI (non-ST elevated myocardial infarction) (HCC)   [place] [facility] [place]. [facility] Delbert Clam, MD   10 months ago Type 2 diabetes mellitus with other circulatory complication, without long-term current use of insulin  [facility])   [place] [facility] [place]. [facility] Delbert Clam, MD   1 year ago NSTEMI (non-ST elevated myocardial infarction) [facility])   [place] [facility] [place]. [facility] Delbert Clam, MD   1 year ago Annual visit for general adult medical examination with abnormal findings   [place] [facility] [place]. [facility] Delbert Clam, MD   1 year ago Type 2 diabetes mellitus with other circulatory complication, without long-term current use of insulin  [facility])    [facility] [place]. [facility] Delbert Clam, MD

## 2024-07-27 ENCOUNTER — Telehealth: Payer: Self-pay | Admitting: Family Medicine

## 2024-07-27 NOTE — Telephone Encounter (Signed)
 Called patient, No answer. Left voicemail for patient to call back.

## 2024-07-27 NOTE — Telephone Encounter (Signed)
 Copied from CRM #[id number]. Topic: Appointments - Appointment Scheduling >> Jul 27, 2024 12:27 PM Montie POUR wrote:  Patient/patient representative is calling to schedule an appointment. Refer to attachments for appointment information.  She does not want to go to a different office and would to see if she can be worked in sooner. Please call her at [phone].  Reason for Visit: She is unable to see good out of both eyes and she wants a referral to [facility] at [address]. She will like to come in to discuss.

## 2024-08-02 ENCOUNTER — Other Ambulatory Visit: Payer: Self-pay

## 2024-08-02 ENCOUNTER — Ambulatory Visit

## 2024-08-04 ENCOUNTER — Other Ambulatory Visit: Payer: Self-pay

## 2024-08-08 ENCOUNTER — Ambulatory Visit

## 2024-08-09 ENCOUNTER — Encounter: Payer: Self-pay | Admitting: Obstetrics

## 2024-08-09 ENCOUNTER — Other Ambulatory Visit: Payer: Self-pay

## 2024-08-09 ENCOUNTER — Ambulatory Visit (INDEPENDENT_AMBULATORY_CARE_PROVIDER_SITE_OTHER): Admitting: Obstetrics

## 2024-08-09 ENCOUNTER — Other Ambulatory Visit (HOSPITAL_COMMUNITY)
Admission: RE | Admit: 2024-08-09 | Discharge: 2024-08-09 | Disposition: A | Discharge status: Home / Self Care | Source: Ambulatory Visit | Attending: Obstetrics | Admitting: Obstetrics

## 2024-08-09 VITALS — BP 137/82 | HR 76 | Ht 65.0 in | Wt 185.9 lb

## 2024-08-09 DIAGNOSIS — E669 Obesity, unspecified: Secondary | ICD-10-CM

## 2024-08-09 DIAGNOSIS — N951 Menopausal and female climacteric states: Secondary | ICD-10-CM

## 2024-08-09 DIAGNOSIS — Z01419 Encounter for gynecological examination (general) (routine) without abnormal findings: Secondary | ICD-10-CM

## 2024-08-09 DIAGNOSIS — N898 Other specified noninflammatory disorders of vagina: Secondary | ICD-10-CM | POA: Insufficient documentation

## 2024-08-09 DIAGNOSIS — I110 Hypertensive heart disease with heart failure: Secondary | ICD-10-CM

## 2024-08-09 DIAGNOSIS — E1165 Type 2 diabetes mellitus with hyperglycemia: Secondary | ICD-10-CM

## 2024-08-09 DIAGNOSIS — Z794 Long term (current) use of insulin: Secondary | ICD-10-CM

## 2024-08-09 DIAGNOSIS — I69354 Hemiplegia and hemiparesis following cerebral infarction affecting left non-dominant side: Secondary | ICD-10-CM

## 2024-08-09 MED ORDER — TERCONAZOLE 0.8 % VA CREA
1.0000 | TOPICAL_CREAM | Freq: Every day | VAGINAL | 2 refills | Status: DC
  Filled 2024-08-09: qty 20, 7d supply, fill #0
  Filled 2024-08-26: qty 20, 7d supply, fill #1
  Filled 2024-10-13: qty 20, 7d supply, fill #2

## 2024-08-09 NOTE — Progress Notes (Signed)
 Pt presents for annual. Pt is having symptoms of yeast, vaginal itching, discharge. No other questions or concerns at this time.
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
## 2024-08-10 ENCOUNTER — Other Ambulatory Visit: Payer: Self-pay

## 2024-08-10 LAB — CERVICOVAGINAL ANCILLARY ONLY
Bacterial Vaginitis (gardnerella): POSITIVE — AB
Candida Glabrata: NEGATIVE
Candida Vaginitis: NEGATIVE
Chlamydia: NEGATIVE
Comment: NEGATIVE
Comment: NEGATIVE
Comment: NEGATIVE
Comment: NEGATIVE
Comment: NEGATIVE
Comment: NORMAL
Neisseria Gonorrhea: NEGATIVE
Trichomonas: NEGATIVE

## 2024-08-10 LAB — CYTOLOGY - PAP
Adequacy: ABSENT
Comment: NEGATIVE
Diagnosis: NEGATIVE
High risk HPV: NEGATIVE

## 2024-08-10 MED ORDER — NITROGLYCERIN 0.4 MG SL SUBL
0.4000 mg | SUBLINGUAL_TABLET | SUBLINGUAL | 5 refills | Status: AC
  Filled 2024-08-10: qty 25, 9d supply, fill #0

## 2024-08-10 MED ORDER — DULOXETINE HCL 60 MG PO CPEP
60.0000 mg | ORAL_CAPSULE | Freq: Every day | ORAL | 5 refills | Status: AC
  Filled 2024-08-10: qty 30, 30d supply, fill #0
  Filled 2024-08-26 – 2024-10-13 (×2): qty 30, 30d supply, fill #1
  Filled 2024-11-17 – 2024-11-23 (×2): qty 30, 30d supply, fill #0
  Filled ????-??-??: fill #0

## 2024-08-10 MED ORDER — JARDIANCE 10 MG PO TABS
10.0000 mg | ORAL_TABLET | Freq: Every day | ORAL | 2 refills | Status: AC
  Filled 2024-08-10: qty 30, 30d supply, fill #0
  Filled 2024-10-13: qty 30, 30d supply, fill #1
  Filled 2024-11-17 – 2024-11-23 (×2): qty 30, 30d supply, fill #0

## 2024-08-11 ENCOUNTER — Ambulatory Visit: Payer: Self-pay | Admitting: Obstetrics

## 2024-08-11 ENCOUNTER — Other Ambulatory Visit: Payer: Self-pay

## 2024-08-11 DIAGNOSIS — B9689 Other specified bacterial agents as the cause of diseases classified elsewhere: Secondary | ICD-10-CM

## 2024-08-11 MED ORDER — VITAMIN D (ERGOCALCIFEROL) 50000 UNITS PO CAPS
1.0000 | ORAL_CAPSULE | ORAL | 5 refills | Status: AC
  Filled 2024-08-11: qty 4, 28d supply, fill #0

## 2024-08-11 MED ORDER — METRONIDAZOLE 500 MG PO TABS
500.0000 mg | ORAL_TABLET | Freq: Two times a day (BID) | ORAL | 2 refills | Status: AC
  Filled 2024-08-11: qty 14, 7d supply, fill #0

## 2024-08-15 ENCOUNTER — Ambulatory Visit
Admission: RE | Admit: 2024-08-15 | Discharge: 2024-08-15 | Disposition: A | Discharge status: Home / Self Care | Source: Ambulatory Visit | Attending: Family Medicine | Admitting: Family Medicine

## 2024-08-15 ENCOUNTER — Other Ambulatory Visit: Payer: Self-pay | Admitting: Family Medicine

## 2024-08-15 ENCOUNTER — Other Ambulatory Visit: Payer: Self-pay

## 2024-08-15 DIAGNOSIS — Z1231 Encounter for screening mammogram for malignant neoplasm of breast: Secondary | ICD-10-CM

## 2024-08-17 ENCOUNTER — Other Ambulatory Visit: Payer: Self-pay

## 2024-08-24 ENCOUNTER — Other Ambulatory Visit: Payer: Self-pay

## 2024-08-25 ENCOUNTER — Ambulatory Visit: Payer: Self-pay | Admitting: Family Medicine

## 2024-08-26 ENCOUNTER — Other Ambulatory Visit: Payer: Self-pay

## 2024-08-26 ENCOUNTER — Ambulatory Visit (HOSPITAL_COMMUNITY): Admission: EM | Admit: 2024-08-26 | Discharge: 2024-08-26 | Disposition: A | Discharge status: Home / Self Care

## 2024-08-26 ENCOUNTER — Encounter (HOSPITAL_COMMUNITY): Payer: Self-pay | Admitting: *Deleted

## 2024-08-26 ENCOUNTER — Other Ambulatory Visit: Payer: Self-pay | Admitting: Family Medicine

## 2024-08-26 DIAGNOSIS — I776 Arteritis, unspecified: Secondary | ICD-10-CM | POA: Diagnosis not present

## 2024-08-26 DIAGNOSIS — Z760 Encounter for issue of repeat prescription: Secondary | ICD-10-CM | POA: Diagnosis not present

## 2024-08-26 MED ORDER — CARVEDILOL 3.125 MG PO TABS
3.1250 mg | ORAL_TABLET | Freq: Two times a day (BID) | ORAL | 2 refills | Status: AC
  Filled 2024-08-26: qty 60, 30d supply, fill #0
  Filled 2024-10-13: qty 60, 30d supply, fill #1
  Filled 2024-11-17 – 2024-11-23 (×2): qty 60, 30d supply, fill #0
  Filled ????-??-??: fill #0

## 2024-08-26 NOTE — ED Triage Notes (Signed)
 PT donates plasm and today she developed a hematoma at donation site on [address]. Pt al;so reports she has not had Carvedilol  for 6 days. I has attempted to call in request but no response.
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
## 2024-08-26 NOTE — Discharge Instructions (Signed)
  1. Acute vasculitis (Primary) - Keep gauze and Coban wrap in place for the next 24 hours covering the area vasculitis prevent any secondary bleeding. - Apply ice 2-3 times a day for 10 to 15 minutes at a time to help with inflammation and pain - Take ibuprofen  or Tylenol  as needed for any pain and inflammation. - Bruising and inflammation should reabsorb in 5 to 7 days continue to monitor symptoms for any changes severity.  2. Medication refill - carvedilol  (COREG ) 3.125 MG tablet; Take 1 tablet (3.125 mg total) by mouth 2 (two) times daily with a meal.  Dispense: 60 tablet; Refill: 2  -Continue to monitor symptoms for any change in severity if there is any escalation of current symptoms or development of new symptoms follow-up in ER for further evaluation and management.

## 2024-08-30 ENCOUNTER — Other Ambulatory Visit: Payer: Self-pay

## 2024-08-30 ENCOUNTER — Ambulatory Visit: Admitting: Nurse Practitioner

## 2024-08-30 MED ORDER — TICAGRELOR 90 MG PO TABS
90.0000 mg | ORAL_TABLET | Freq: Two times a day (BID) | ORAL | 3 refills | Status: AC
  Filled 2024-08-30 – 2024-11-23 (×3): qty 60, 30d supply, fill #0
  Filled ????-??-??: fill #0

## 2024-09-02 ENCOUNTER — Other Ambulatory Visit: Payer: Self-pay

## 2024-09-08 ENCOUNTER — Ambulatory Visit (HOSPITAL_COMMUNITY): Admission: EM | Admit: 2024-09-08 | Discharge: 2024-09-08 | Disposition: A | Discharge status: Home / Self Care

## 2024-09-08 ENCOUNTER — Ambulatory Visit (HOSPITAL_COMMUNITY)

## 2024-09-08 ENCOUNTER — Encounter (HOSPITAL_COMMUNITY): Payer: Self-pay

## 2024-09-08 DIAGNOSIS — S99922A Unspecified injury of left foot, initial encounter: Secondary | ICD-10-CM

## 2024-09-08 DIAGNOSIS — S92515A Nondisplaced fracture of proximal phalanx of left lesser toe(s), initial encounter for closed fracture: Secondary | ICD-10-CM

## 2024-09-08 NOTE — ED Triage Notes (Signed)
 Injury to the left foot 3 days ago. States she has a wound on the Basurto toe that is not improving.   Patient also having blurred vision, denies any dizziness or headaches. States unable to see a person's whole face. Onset 1 month ago but getting worse. States when outside all she sees is gray. States she asked for a eye referral from her primary.

## 2024-09-08 NOTE — ED Provider Notes (Signed)
 PCP: Delbert Clam, MD Chief Complaint: Foot Injury    Subjective:   HPI: Patient is a 58 y.o. female here for left fourth toe pain.  Patient has a history of diabetes, stroke and was walking at her house when she hit her toe on her bed.  She has a history of neuropathy and her diabetes is uncontrolled with the most recent A1c what she believes to be around 12.  She has had some bruising and swelling at the toe.  She is in only a Pfeffer bit of pain at this time.  She also states that over the last month her bilateral vision has been worsening where her central vision seems to be more blurry.  She talk with her primary care provider about this but has not heard any type of referral or contact from an ophthalmologist.  She has not seen an ophthalmologist in quite some time and with her uncontrolled diabetes, this would be beneficial.  Past Medical History:  Diagnosis Date   Anxiety    CAD S/P percutaneous coronary angioplasty 03/29/2022   Cath-PCI 03/2022:   RPDA-1 lesion is 40% stenosed. RPDA-2 stent is 99% re-stenosed (DES PCI Synergy DX 2.5 x 24 => 0%);   2nd Diag lesion is 80% stenosed => Med Rx;   Previously placed Mid Cx stent  widely patent.;   LV end diastolic pressure is normal.     Intervention: PTCA/DES PCI of PDA ISR: 2.5 x 24 mm Synergy XD stent                                      Diabetes mellitus    Embolism (HCC)    Hypertension    NSTEMI (non-ST elevated myocardial infarction) (HCC) 03/29/2022   Sarcoidosis     No current facility-administered medications on file prior to encounter.   Current Outpatient Medications on File Prior to Encounter  Medication Sig Dispense Refill   carvedilol  (COREG ) 3.125 MG tablet Take 1 tablet (3.125 mg total) by mouth 2 (two) times daily with a meal. 60 tablet 2   DULoxetine  (CYMBALTA ) 60 MG capsule Take 1 capsule (60 mg total) by mouth daily. 90 capsule 0   DULoxetine  (CYMBALTA ) 60 MG capsule Take 1 capsule (60 mg total) by mouth daily.  30 capsule 5   empagliflozin  (JARDIANCE ) 10 MG TABS tablet Take 1 tablet (10 mg total) by mouth daily. 30 tablet 2   ezetimibe  (ZETIA ) 10 MG tablet Take 1 tablet (10 mg total) by mouth daily. 30 tablet 1   famotidine  (PEPCID ) 20 MG tablet TAKE 1 TABLET BY MOUTH TWICE DAILY 180 tablet 1   furosemide  (LASIX ) 40 MG tablet Take 1 tablet (40 mg total) by mouth daily. 90 tablet 0   insulin  glargine (LANTUS  SOLOSTAR) 100 UNIT/ML Solostar Pen Inject 35 Units into the skin 2 (two) times daily. 30 mL 3   Insulin  Pen Needle (B-D UF III MINI PEN NEEDLES) 31G X 5 MM MISC USE AS DIRECTED 2 TIMES A DAY 100 each 5   isosorbide  mononitrate (IMDUR ) 60 MG 24 hr tablet Take 1 tablet (60 mg total) by mouth daily. 90 tablet 1   metroNIDAZOLE  (FLAGYL ) 500 MG tablet Take 1 tablet (500 mg total) by mouth 2 (two) times daily. 14 tablet 2   potassium chloride  (KLOR-CON  M) 10 MEQ tablet Take 2 tablets (20 mEq total) by mouth daily. 60 tablet 1   rosuvastatin  (CRESTOR )  40 MG tablet Take 1 tablet (40 mg total) by mouth daily. 30 tablet 3   terconazole (TERAZOL 3) 0.8 % vaginal cream Place 1 applicator vaginally at bedtime. 20 g 2   ticagrelor  (BRILINTA ) 90 MG TABS tablet Take 1 tablet (90 mg total) by mouth 2 (two) times daily. 60 tablet 3   Vitamin D , Ergocalciferol , 50000 units CAPS Take 1 capsule by mouth once a week. 4 capsule 5   Blood Glucose Monitoring Suppl (TRUE METRIX METER) w/Device KIT use as directed three times a day 1 kit 0   glucose blood (ACCU-CHEK GUIDE) test strip Use as instructed to check blood sugar three times daily. 100 each 6   nitroGLYCERIN  (NITROSTAT ) 0.4 MG SL tablet PLACE 1 TABLET UNDER THE TOUNGE EVERY 5 MINS  AS NEEDED FOR CHEST PAIN 100 tablet 0   nitroGLYCERIN  (NITROSTAT ) 0.4 MG SL tablet Place 1 tablet under the tongue every 5 minutes as needed. May repeat up to 3 doses. 30 tablet 5   TRUEplus Lancets 28G MISC use as directed mto test blood sugars 3 times a day 100 each 6   [DISCONTINUED]  lisinopril  (ZESTRIL ) 20 MG tablet Take 1 tablet (20 mg total) by mouth daily. 30 tablet 5   [DISCONTINUED] omeprazole  (PRILOSEC) 40 MG capsule Take 1 capsule (40 mg total) by mouth daily. 30 capsule 0    BP (!) 170/70 (BP Location: Left Arm)   Pulse 73   Temp 98 F (36.7 C) (Oral)   Resp 18   Ht 5' 5 (1.651 m)   Wt 83 kg   LMP 01/24/2015 (Approximate)   SpO2 96%   BMI 30.45 kg/m        Objective:   Gen: Well developed, well nourished female in no acute distress. HEENT: Pupils equal, round, and reactive to light.  Conjunctiva non-injected.  Nares patent without discharge.  Oral mucosa is moist and pink.  Lungs: Normal respiratory effort MSK: Left foot pulses are palpable and symmetric, left fourth toe has swelling and ecchymosis present over the PIP.  Only mildly tender to palpation both dorsal and plantar.  No superficial wound, sores or breakdown of her skin. Ext: No cyanosis, clubbing, or edema.  Assessment/Plan:   KIMBERLYE DILGER is a 58 y.o. female who was seen today for the following: 1. Injury of left toe, initial encounter (Primary) - DG Foot Complete Left; Standing - DG Toe 4th Left; Standing - DG Foot Complete Left - DG Toe 4th Left  2. Closed nondisplaced fracture of proximal phalanx of lesser toe of left foot, initial encounter - XR positive for nondisplaced proximal metaphyseal fracture of the fourth toe proximal phalanx - Will buddy tape 4th and 5th toe for support - Will place patient in rigid postop shoe - Instructions given for wearing shoe as well as follow-up with her primary care provider in 4 weeks - Should there be any changes, worsening symptoms, she is welcome to return here  Follow-up/Education:   May return sooner as needed and encouraged to call/e-mail for additional questions or  worsening symptoms in the interim.  Krystal Lowing, DO Sports Medicine Fellow 09/08/2024 12:58 PM  Disclaimer: This transcription was electronically signed. It was  transcribed by [place] and may contain errors in the text that were not recognized on proofreading.     Lowing Krystal CHRISTELLA, DO 09/08/24 1258

## 2024-09-20 ENCOUNTER — Ambulatory Visit: Payer: Self-pay

## 2024-09-20 ENCOUNTER — Telehealth: Payer: Self-pay | Admitting: Family Medicine

## 2024-09-20 NOTE — Telephone Encounter (Signed)
 Noted . Agree patient should go to ED immediately for evaluation

## 2024-09-20 NOTE — Telephone Encounter (Signed)
 FYI Only or Action Required?: FYI only for provider: ED advised. - will go in the morning  Patient was last seen in primary care on 12/23/2023 by Newlin, Enobong, MD.  Called Nurse Triage reporting Blurred Vision.PT has double vision inside and everything is grayed out when outside. Pt isskipping words and and has HA.  Symptoms began about a month ago.  Interventions attempted: Nothing.  Symptoms are: gradually worsening.  Triage Disposition: Go to ED Now (or PCP Triage)  Patient/caregiver understands and will follow disposition?: Unsure - pt will go to ED tomorrow.                     Copied from CRM #[id number]. Topic: Clinical - Red Word Triage >> Sep 20, 2024  3:09 PM Shanda MATSU wrote: Red Word that prompted transfer to Nurse Triage: Patient is reporting blurry vision that is getting worse Reason for Disposition  Double vision  Answer Assessment - Initial Assessment Questions 1. DESCRIPTION: How has your vision changed? (e.g., complete vision loss, blurred vision, double vision, floaters, etc.)     Double vision inside and gray outside 2. LOCATION: One or both eyes? If one, ask: Which eye?     yes 3. SEVERITY: Can you see anything? If Yes, ask: What can you see? (e.g., fine print)     limited 4. ONSET: When did this begin? Did it start suddenly or has this been gradual?     1 month 5. PATTERN: Does this come and go, or has it been constant since it started?     Constant and worsening 6. PAIN: Is there any pain in your eye(s)?  (Scale 1-10; or mild, moderate, severe)     No pain 7. CONTACTS-GLASSES: Do you wear contacts or glasses?     no 8. CAUSE: What do you think is causing this visual problem?     Unsure - pt has had several strokes and MI, also pt is diabetic 9. OTHER SYMPTOMS: Do you have any other symptoms? (e.g., confusion, headache, arm or leg weakness, speech problems)     HA, speech - daughter states she is skipping  words  Protocols used: Vision Loss or Change-A-AH

## 2024-09-20 NOTE — Telephone Encounter (Signed)
 Copied from CRM #[id number]. Topic: Appointments - Scheduling Inquiry for Clinic >> Sep 20, 2024  3:08 PM Shanda MATSU wrote:  Reason for CRM: Patient is wanting to know if she can be seen sooner than the first avail appt, patient stated she broke 2 toes last week and was referred to a specialist.

## 2024-09-20 NOTE — Telephone Encounter (Signed)
 Contacted patient, unable to reach patient or leave VM.

## 2024-09-21 ENCOUNTER — Emergency Department (HOSPITAL_COMMUNITY)

## 2024-09-21 ENCOUNTER — Emergency Department (HOSPITAL_COMMUNITY)
Admission: EM | Admit: 2024-09-21 | Discharge: 2024-09-21 | Discharge status: Against Medical Advice | Attending: Physician Assistant | Admitting: Physician Assistant

## 2024-09-21 ENCOUNTER — Encounter (HOSPITAL_COMMUNITY): Payer: Self-pay | Admitting: Emergency Medicine

## 2024-09-21 ENCOUNTER — Other Ambulatory Visit: Payer: Self-pay

## 2024-09-21 DIAGNOSIS — Z5321 Procedure and treatment not carried out due to patient leaving prior to being seen by health care provider: Secondary | ICD-10-CM | POA: Diagnosis not present

## 2024-09-21 DIAGNOSIS — R4781 Slurred speech: Secondary | ICD-10-CM | POA: Diagnosis not present

## 2024-09-21 DIAGNOSIS — H532 Diplopia: Secondary | ICD-10-CM | POA: Insufficient documentation

## 2024-09-21 DIAGNOSIS — Z8673 Personal history of transient ischemic attack (TIA), and cerebral infarction without residual deficits: Secondary | ICD-10-CM | POA: Insufficient documentation

## 2024-09-21 DIAGNOSIS — R42 Dizziness and giddiness: Secondary | ICD-10-CM | POA: Diagnosis present

## 2024-09-21 LAB — CBC WITH DIFFERENTIAL/PLATELET
Abs Immature Granulocytes: 0.01 K/uL (ref 0.00–0.07)
Basophils Absolute: 0.1 K/uL (ref 0.0–0.1)
Basophils Relative: 1 %
Eosinophils Absolute: 0.1 K/uL (ref 0.0–0.5)
Eosinophils Relative: 2 %
HCT: 38.7 % (ref 36.0–46.0)
Hemoglobin: 13.1 g/dL (ref 12.0–15.0)
Immature Granulocytes: 0 %
Lymphocytes Relative: 34 %
Lymphs Abs: 1.7 K/uL (ref 0.7–4.0)
MCH: 32.4 pg (ref 26.0–34.0)
MCHC: 33.9 g/dL (ref 30.0–36.0)
MCV: 95.8 fL (ref 80.0–100.0)
Monocytes Absolute: 0.4 K/uL (ref 0.1–1.0)
Monocytes Relative: 8 %
Neutro Abs: 2.7 K/uL (ref 1.7–7.7)
Neutrophils Relative %: 55 %
Platelets: 239 K/uL (ref 150–400)
RBC: 4.04 MIL/uL (ref 3.87–5.11)
RDW: 12.7 % (ref 11.5–15.5)
WBC: 4.9 K/uL (ref 4.0–10.5)
nRBC: 0 % (ref 0.0–0.2)

## 2024-09-21 LAB — COMPREHENSIVE METABOLIC PANEL WITH GFR
ALT: 19 U/L (ref 0–44)
AST: 18 U/L (ref 15–41)
Albumin: 3.5 g/dL (ref 3.5–5.0)
Alkaline Phosphatase: 53 U/L (ref 38–126)
Anion gap: 13 (ref 5–15)
BUN: 13 mg/dL (ref 6–20)
CO2: 22 mmol/L (ref 22–32)
Calcium: 9 mg/dL (ref 8.9–10.3)
Chloride: 106 mmol/L (ref 98–111)
Creatinine, Ser: 0.7 mg/dL (ref 0.44–1.00)
GFR, Estimated: >60 mL/min (ref >60)
Glucose, Bld: 317 mg/dL — ABNORMAL HIGH (ref 70–99)
Potassium: 3.5 mmol/L (ref 3.5–5.1)
Sodium: 141 mmol/L (ref 135–145)
Total Bilirubin: 0.5 mg/dL (ref 0.0–1.2)
Total Protein: 6.3 g/dL — ABNORMAL LOW (ref 6.5–8.1)

## 2024-09-21 LAB — URINALYSIS, ROUTINE W REFLEX MICROSCOPIC
Bacteria, UA: NONE SEEN
Bilirubin Urine: NEGATIVE
Glucose, UA: >=500 mg/dL — AB
Hgb urine dipstick: NEGATIVE
Ketones, ur: NEGATIVE mg/dL
Leukocytes,Ua: NEGATIVE
Nitrite: NEGATIVE
Protein, ur: NEGATIVE mg/dL
Specific Gravity, Urine: 1.033 — ABNORMAL HIGH (ref 1.005–1.030)
pH: 6 (ref 5.0–8.0)

## 2024-09-21 NOTE — ED Notes (Signed)
 Pt reports she not longer wants to wait and is going home.  Pt encouraged to stay.

## 2024-09-21 NOTE — ED Triage Notes (Signed)
 Pt reports HA and double vision for a month ago. Pt has seen PCP and referred to eye doctor. Also reports slurring of words. Reports hx of CVA.  Also would like her left foot checked, seen at [facility] last week and told she had broken toes.

## 2024-09-21 NOTE — ED Provider Triage Note (Signed)
 Emergency Medicine Provider Triage Evaluation Note  [name] , a 58 y.o. female  was evaluated in triage.  Pt complains of dizziness and visual change.  Pt reports dizziness started yesterday.  Pt reports she has had double vision for about 4 weeks.  Pt broke her toes   Review of Systems  Positive: Dizziness and vision change Negative: Fever or chills  Physical Exam  BP (!) 177/72 (BP Location: Right Arm)   Pulse 74   Temp 98.1 F (36.7 C)   Resp 15   Ht 5' 5 (1.651 m)   Wt 83 kg   LMP 01/24/2015 (Approximate)   SpO2 100%   BMI 30.45 kg/m  Gen:   Awake, no distress   Resp:  Normal effort  MSK:   Moves extremities without difficulty  Other:    Medical Decision Making  Medically screening exam initiated at 11:01 AM.  Appropriate orders placed.  Wynelle Dreier Roebuck was informed that the remainder of the evaluation will be completed by another provider, this initial triage assessment does not replace that evaluation, and the importance of remaining in the ED until their evaluation is complete.     Flint Sonny POUR, PA-C 09/21/24 1105

## 2024-10-10 NOTE — Progress Notes (Deleted)
 [facility] [address] [place]. [place] [address] [phone]       OFFICE FOLLOW-UP NOTE  [name] Date of Birth:  11-30-1965 Medical Record Number:  [id number]    Primary neurologist: Dr. Rosemarie Reason for visit: Stroke follow-up   No chief complaint on file.     HPI:   Update 10/11/2024 JM: [name].        Initial visit 03/03/2024 Dr. Rosemarie: [name] is a 57 year old lady seen today for initial office follow-up visit following hospital consultation for strokes.  History is obtained from the patient and review of electronic medical records and I personally reviewed pertinent available imaging films in PACS.  She has hx of CAD, s/p PCI to Lcx and PCA,, PDA stent s/p DES, DVT, DM2, Obesity, HTN, HLD, CVA s/p cath, chest pain 01/08/24 with diaphoresis and SOB, progressive chest pain who presented today for cardiac cath.  Inpatient code stroke was called due to confusion. She was also noted to be c/o headache behind her left eye after the procedure. On neurology exam, patient was drowsy, confused and perseverated in her responses, globally aphasic, mild left facial droop at rest, questionable RLE weakness.  CT head showed no acute abnormality.  Patient was several hours out from a cardiac cath procedure and TNK was offered but daughter declined after discussion of risk benefits and alternatives.  CT angiogram was negative for large vessel occlusion.  NIH stroke scale was 9.  MRI scan confirmed tiny punctate infarcts in bilateral cerebral hemispheres and the right cerebellum.  Patient neurological exam improved.  2D echo showed ejection fraction of 70 to 75%.  LDL cholesterol elevated at 135 mg percent.  Hemoglobin A1c was 12.3.  Patient was on clopidogrel  prior to admission and since she was allergic to aspirin  this was switched to Brilinta .  Patient did have prior history of similar by cerebral infarcts following cardiac  catheterization on 03/30/2022 and MRI at that time had shown small bilateral cerebral cortex and right thalamic infarcts.  She had residual left leg weakness from this which was persistent.  Patient states she is doing well from discharge.  Confusion has improved but she still has some occasional garbled speech for a few minutes without any obvious triggers.  She also gets disoriented at times.  She feels her short-term memory is poor and her mind goes blank at times.  She remains on Trintellix tolerating well without bleeding but does bruise easily.  She is tolerating Crestor  40 mg daily without muscle aches and pains.  She does have upcoming visit with primary care physician and will likely have follow-up lipid profile checked.    ROS:   14 system review of systems is positive for memory difficulties, speech difficulties, leg weakness, difficulty walking, disorientation all other systems negative  PMH:  Past Medical History:  Diagnosis Date   Anxiety    CAD S/P percutaneous coronary angioplasty 03/29/2022   Cath-PCI 03/2022:   RPDA-1 lesion is 40% stenosed. RPDA-2 stent is 99% re-stenosed (DES PCI Synergy DX 2.5 x 24 => 0%);   2nd Diag lesion is 80% stenosed => Med Rx;   Previously placed Mid Cx stent  widely patent.;   LV end diastolic pressure is normal.     Intervention: PTCA/DES PCI of PDA ISR: 2.5 x 24 mm Synergy XD stent  Diabetes mellitus    Embolism (HCC)    Hypertension    NSTEMI (non-ST elevated myocardial infarction) (HCC) 03/29/2022   Sarcoidosis     Social History:  Social History   Socioeconomic History   Marital status: Legally Separated    Spouse name: Not on file   Number of children: Not on file   Years of education: Not on file   Highest education level: Not on file  Occupational History   Not on file  Tobacco Use   Smoking status: Never   Smokeless tobacco: Never  Vaping Use   Vaping status: Never Used  Substance and Sexual  Activity   Alcohol use: Yes    Comment: occasional   Drug use: No   Sexual activity: Not Currently    Partners: Male    Birth control/protection: Surgical  Other Topics Concern   Not on file  Social History Narrative   Not on file   Social Drivers of Health   Financial Resource Strain: High Risk (09/21/2023)   Overall Financial Resource Strain (CARDIA)    Difficulty of Paying Living Expenses: Very hard  Food Insecurity: Medium Risk (03/22/2024)   Received from [facility]   Hunger Vital Sign    Within the past 12 months, you worried that your food would run out before you got money to buy more: Sometimes true    Within the past 12 months, the food you bought just didn't last and you didn't have money to get more. : Sometimes true  Transportation Needs: No Transportation Needs (03/22/2024)   Received from [facility]    In the past 12 months, has lack of reliable transportation kept you from medical appointments, meetings, work or from getting things needed for daily living? : No  Recent Concern: Transportation Needs - Unmet Transportation Needs (01/12/2024)   PRAPARE - Administrator, Civil Service (Medical): Yes    Lack of Transportation (Non-Medical): No  Physical Activity: Not on file  Stress: Stress Concern Present (09/21/2023)   [employer] of Occupational Health - Occupational Stress Questionnaire    Feeling of Stress : Very much  Social Connections: Not on file  Intimate Partner Violence: Patient Declined (01/12/2024)   Humiliation, Afraid, Rape, and Kick questionnaire    Fear of Current or Ex-Partner: Patient declined    Emotionally Abused: Patient declined    Physically Abused: Patient declined    Sexually Abused: Patient declined    Medications:   Current Outpatient Medications on File Prior to Visit  Medication Sig Dispense Refill   Blood Glucose Monitoring Suppl (TRUE METRIX METER) w/Device KIT use as directed three times a  day 1 kit 0   carvedilol  (COREG ) 3.125 MG tablet Take 1 tablet (3.125 mg total) by mouth 2 (two) times daily with a meal. 60 tablet 2   DULoxetine  (CYMBALTA ) 60 MG capsule Take 1 capsule (60 mg total) by mouth daily. 90 capsule 0   DULoxetine  (CYMBALTA ) 60 MG capsule Take 1 capsule (60 mg total) by mouth daily. 30 capsule 5   empagliflozin  (JARDIANCE ) 10 MG TABS tablet Take 1 tablet (10 mg total) by mouth daily. 30 tablet 2   ezetimibe  (ZETIA ) 10 MG tablet Take 1 tablet (10 mg total) by mouth daily. 30 tablet 1   famotidine  (PEPCID ) 20 MG tablet TAKE 1 TABLET BY MOUTH TWICE DAILY 180 tablet 1   furosemide  (LASIX ) 40 MG tablet Take 1 tablet (40 mg total) by mouth daily. 90 tablet 0  glucose blood (ACCU-CHEK GUIDE) test strip Use as instructed to check blood sugar three times daily. 100 each 6   insulin  glargine (LANTUS  SOLOSTAR) 100 UNIT/ML Solostar Pen Inject 35 Units into the skin 2 (two) times daily. 30 mL 3   Insulin  Pen Needle (B-D UF III MINI PEN NEEDLES) 31G X 5 MM MISC USE AS DIRECTED 2 TIMES A DAY 100 each 5   isosorbide  mononitrate (IMDUR ) 60 MG 24 hr tablet Take 1 tablet (60 mg total) by mouth daily. 90 tablet 1   metroNIDAZOLE  (FLAGYL ) 500 MG tablet Take 1 tablet (500 mg total) by mouth 2 (two) times daily. 14 tablet 2   nitroGLYCERIN  (NITROSTAT ) 0.4 MG SL tablet PLACE 1 TABLET UNDER THE TOUNGE EVERY 5 MINS  AS NEEDED FOR CHEST PAIN 100 tablet 0   nitroGLYCERIN  (NITROSTAT ) 0.4 MG SL tablet Place 1 tablet under the tongue every 5 minutes as needed. May repeat up to 3 doses. 30 tablet 5   potassium chloride  (KLOR-CON  M) 10 MEQ tablet Take 2 tablets (20 mEq total) by mouth daily. 60 tablet 1   rosuvastatin  (CRESTOR ) 40 MG tablet Take 1 tablet (40 mg total) by mouth daily. 30 tablet 3   terconazole  (TERAZOL 3 ) 0.8 % vaginal cream Place 1 applicator vaginally at bedtime. 20 g 2   ticagrelor  (BRILINTA ) 90 MG TABS tablet Take 1 tablet (90 mg total) by mouth 2 (two) times daily. 60 tablet 3    TRUEplus Lancets 28G MISC use as directed mto test blood sugars 3 times a day 100 each 6   Vitamin D , Ergocalciferol , 50000 units CAPS Take 1 capsule by mouth once a week. 4 capsule 5   [DISCONTINUED] lisinopril  (ZESTRIL ) 20 MG tablet Take 1 tablet (20 mg total) by mouth daily. 30 tablet 5   [DISCONTINUED] omeprazole  (PRILOSEC) 40 MG capsule Take 1 capsule (40 mg total) by mouth daily. 30 capsule 0   No current facility-administered medications on file prior to visit.    Allergies:   Allergies  Allergen Reactions   Aspirin  Anaphylaxis and Swelling    Tongue swells   Tramadol Hives   Hydrocodone  Rash   Ibuprofen  Itching   Morphine  And Codeine Itching   Orange Fruit [Citrus] Rash    Physical Exam There were no vitals filed for this visit. There is no height or weight on file to calculate BMI.   General: well developed, well nourished pleasant middle-age lady, seated, in no evident distress Head: head normocephalic and atraumatic.  Neck: supple with no carotid or supraclavicular bruits Cardiovascular: regular rate and rhythm, no murmurs Musculoskeletal: no deformity Skin:  no rash/petichiae Vascular:  Normal pulses all extremities  Neurologic Exam Mental Status: Awake and fully alert. Oriented to place and time. Recent and remote memory intact. Attention span, concentration and fund of knowledge appropriate. Mood and affect appropriate.  Diminished recall 2/3.  Able to name 10 animals which can walk on 4 legs.  Clock drawing 4/4. Cranial Nerves: Pupils equal, briskly reactive to light. Extraocular movements full without nystagmus. Visual fields full to confrontation. Hearing intact. Facial sensation intact.  Mild left lower facial asymmetry, tongue, palate moves normally and symmetrically.  Motor: Mild left hemiparesis with 4+/5 left upper extremity strength with weakness of left grip and intrinsic hand muscles.  4/5 left lower extremity strength with left foot drop and 3/5 distal  weakness and 4+/5 proximal hip and knee weakness. Sensory.: intact to touch ,pinprick .position and vibratory sensation.  Coordination: Rapid alternating movements normal in  all extremities. Finger-to-nose and heel-to-shin performed accurately bilaterally. Gait and Station: Arises from chair without difficulty. Stance is normal. Gait demonstrates dragging of the left leg with left foot drop. Reflexes: 1+ and asymmetric and brisker on the left. Toes downgoing.        ASSESSMENT: 58 year old Caucasian lady with small embolic infarcts following cardiac catheterization procedure in March 2025 who is doing well but has mild cognitive impairment post stroke.  Vascular risk factors of coronary artery disease, s/p coronary artery stenting, NSTEMI 01/2024, diabetes, hypertension, hyperlipidemia and prior stroke.    PLAN:  - Continue Brilinta  and Crestor  for secondary stroke prevention and s/p coronary artery stenting managed/prescribed by PCP - Advised to schedule follow-up visit with PCP and ensure close follow-up for aggressive stroke risk factor management   I had a long d/w patient about her recent embolic strokes following cardiac catheterization and mild cognitive impairment,, risk for recurrent stroke/TIAs, personally independently reviewed imaging studies and stroke evaluation results and answered questions.Continue Brilinta  (ticagrelor ) 90 mg bid  for secondary stroke prevention as she is allergic to aspirin  and maintain strict control of hypertension with blood pressure goal below 130/90, diabetes with hemoglobin A1c goal below 6.5% and lipids with LDL cholesterol goal below 70 mg/dL. I also advised the patient to eat a healthy diet with plenty of whole grains, cereals, fruits and vegetables, exercise regularly and maintain ideal body weight.  I recommend she increase participation in cognitively challenging activities like solving crossword puzzles, playing bridge and sudoku.  We also discussed  memory compensation strategies.  Followup in the future with my nurse practitioner in 6 months or call earlier if necessary.    I personally spent a total of *** minutes in the care of the patient today including {Time Based Coding:[id number]}.  Harlene Bogaert, AGNP-BC  [facility] [address] [place], [place] [address]  Phone [phone] Fax [phone] Note: This document was prepared with digital dictation and possible smart phrase technology. Any transcriptional errors that result from this process are unintentional.

## 2024-10-11 ENCOUNTER — Encounter: Payer: Self-pay | Admitting: Adult Health

## 2024-10-11 ENCOUNTER — Telehealth: Payer: Self-pay | Admitting: Neurology

## 2024-10-11 ENCOUNTER — Ambulatory Visit: Admitting: Adult Health

## 2024-10-11 NOTE — Telephone Encounter (Signed)
 Pt left vm asking to be called to r/s missed appointment, pt has been rescheduled to see NP.

## 2024-10-13 ENCOUNTER — Other Ambulatory Visit: Payer: Self-pay

## 2024-10-13 ENCOUNTER — Other Ambulatory Visit: Payer: Self-pay | Admitting: Family Medicine

## 2024-10-17 ENCOUNTER — Other Ambulatory Visit: Payer: Self-pay

## 2024-10-18 ENCOUNTER — Other Ambulatory Visit: Payer: Self-pay

## 2024-11-17 ENCOUNTER — Other Ambulatory Visit (HOSPITAL_COMMUNITY): Payer: Self-pay

## 2024-11-17 ENCOUNTER — Other Ambulatory Visit: Payer: Self-pay | Admitting: Obstetrics

## 2024-11-17 ENCOUNTER — Other Ambulatory Visit: Payer: Self-pay

## 2024-11-17 ENCOUNTER — Other Ambulatory Visit: Payer: Self-pay | Admitting: Family Medicine

## 2024-11-17 DIAGNOSIS — N898 Other specified noninflammatory disorders of vagina: Secondary | ICD-10-CM

## 2024-11-17 MED ORDER — TERCONAZOLE 0.8 % VA CREA
1.0000 | TOPICAL_CREAM | Freq: Every day | VAGINAL | 2 refills | Status: AC
  Filled 2024-11-17 – 2024-11-23 (×3): qty 20, 7d supply, fill #0

## 2024-11-18 ENCOUNTER — Encounter: Payer: Self-pay | Admitting: Pharmacist

## 2024-11-18 ENCOUNTER — Other Ambulatory Visit (HOSPITAL_COMMUNITY): Payer: Self-pay

## 2024-11-18 ENCOUNTER — Other Ambulatory Visit: Payer: Self-pay

## 2024-11-21 ENCOUNTER — Other Ambulatory Visit: Payer: Self-pay

## 2024-11-23 ENCOUNTER — Other Ambulatory Visit (HOSPITAL_COMMUNITY): Payer: Self-pay

## 2024-11-23 ENCOUNTER — Other Ambulatory Visit: Payer: Self-pay

## 2024-11-25 ENCOUNTER — Other Ambulatory Visit: Payer: Self-pay

## 2024-11-25 ENCOUNTER — Other Ambulatory Visit (HOSPITAL_COMMUNITY): Payer: Self-pay

## 2024-11-30 ENCOUNTER — Ambulatory Visit: Payer: Self-pay

## 2025-01-06 ENCOUNTER — Ambulatory Visit: Admitting: Adult Health
